# Patient Record
Sex: Female | Born: 1935 | Race: White | Hispanic: No | Smoking: Never smoker
Health system: Southern US, Community
[De-identification: ages and names within clinical notes are randomized; demographics above are authoritative.]

## PROBLEM LIST (undated history)

## (undated) DIAGNOSIS — T7840XA Allergy, unspecified, initial encounter: Secondary | ICD-10-CM

## (undated) DIAGNOSIS — M1990 Unspecified osteoarthritis, unspecified site: Secondary | ICD-10-CM

## (undated) DIAGNOSIS — Z972 Presence of dental prosthetic device (complete) (partial): Secondary | ICD-10-CM

## (undated) DIAGNOSIS — I10 Essential (primary) hypertension: Secondary | ICD-10-CM

## (undated) DIAGNOSIS — C569 Malignant neoplasm of unspecified ovary: Secondary | ICD-10-CM

## (undated) DIAGNOSIS — E785 Hyperlipidemia, unspecified: Secondary | ICD-10-CM

## (undated) DIAGNOSIS — I2510 Atherosclerotic heart disease of native coronary artery without angina pectoris: Secondary | ICD-10-CM

## (undated) DIAGNOSIS — C801 Malignant (primary) neoplasm, unspecified: Secondary | ICD-10-CM

## (undated) DIAGNOSIS — C4499 Other specified malignant neoplasm of skin, unspecified: Secondary | ICD-10-CM

## (undated) DIAGNOSIS — Z9221 Personal history of antineoplastic chemotherapy: Secondary | ICD-10-CM

## (undated) DIAGNOSIS — C50919 Malignant neoplasm of unspecified site of unspecified female breast: Secondary | ICD-10-CM

## (undated) DIAGNOSIS — S3210XA Unspecified fracture of sacrum, initial encounter for closed fracture: Secondary | ICD-10-CM

## (undated) DIAGNOSIS — I429 Cardiomyopathy, unspecified: Secondary | ICD-10-CM

## (undated) DIAGNOSIS — Z923 Personal history of irradiation: Secondary | ICD-10-CM

## (undated) DIAGNOSIS — K5790 Diverticulosis of intestine, part unspecified, without perforation or abscess without bleeding: Secondary | ICD-10-CM

## (undated) DIAGNOSIS — K909 Intestinal malabsorption, unspecified: Secondary | ICD-10-CM

## (undated) DIAGNOSIS — Z973 Presence of spectacles and contact lenses: Secondary | ICD-10-CM

## (undated) HISTORY — PX: SHOULDER ARTHROSCOPY: SHX128

## (undated) HISTORY — DX: Malignant neoplasm of unspecified ovary: C56.9

## (undated) HISTORY — DX: Diverticulosis of intestine, part unspecified, without perforation or abscess without bleeding: K57.90

## (undated) HISTORY — DX: Atherosclerotic heart disease of native coronary artery without angina pectoris: I25.10

## (undated) HISTORY — DX: Intestinal malabsorption, unspecified: K90.9

## (undated) HISTORY — DX: Unspecified osteoarthritis, unspecified site: M19.90

## (undated) HISTORY — PX: BREAST SURGERY: SHX581

## (undated) HISTORY — DX: Hyperlipidemia, unspecified: E78.5

## (undated) HISTORY — PX: EYE SURGERY: SHX253

## (undated) HISTORY — DX: Essential (primary) hypertension: I10

## (undated) HISTORY — DX: Cardiomyopathy, unspecified: I42.9

## (undated) HISTORY — DX: Allergy, unspecified, initial encounter: T78.40XA

## (undated) HISTORY — DX: Other specified malignant neoplasm of skin, unspecified: C44.99

## (undated) HISTORY — DX: Malignant neoplasm of unspecified site of unspecified female breast: C50.919

## (undated) HISTORY — DX: Malignant (primary) neoplasm, unspecified: C80.1

---

## 1988-11-16 HISTORY — PX: SHOULDER SURGERY: SHX246

## 1988-11-16 HISTORY — PX: OTHER SURGICAL HISTORY: SHX169

## 1991-11-17 HISTORY — PX: CHOLECYSTECTOMY: SHX55

## 1992-11-16 DIAGNOSIS — Z923 Personal history of irradiation: Secondary | ICD-10-CM

## 1992-11-16 HISTORY — PX: MODIFIED RADICAL MASTECTOMY W/ AXILLARY LYMPH NODE DISSECTION W/ PECTORALIS MINOR EXCISION: SHX2043

## 1992-11-16 HISTORY — PX: OTHER SURGICAL HISTORY: SHX169

## 1992-11-16 HISTORY — DX: Personal history of irradiation: Z92.3

## 1999-03-27 ENCOUNTER — Ambulatory Visit (HOSPITAL_COMMUNITY): Admission: RE | Admit: 1999-03-27 | Discharge: 1999-03-27 | Payer: Self-pay | Admitting: Gastroenterology

## 1999-05-01 ENCOUNTER — Other Ambulatory Visit: Admission: RE | Admit: 1999-05-01 | Discharge: 1999-05-01 | Payer: Self-pay | Admitting: Urology

## 1999-11-26 ENCOUNTER — Other Ambulatory Visit: Admission: RE | Admit: 1999-11-26 | Discharge: 1999-11-26 | Payer: Self-pay | Admitting: Urology

## 2000-08-13 ENCOUNTER — Other Ambulatory Visit: Admission: RE | Admit: 2000-08-13 | Discharge: 2000-08-13 | Payer: Self-pay | Admitting: Internal Medicine

## 2001-11-14 ENCOUNTER — Other Ambulatory Visit: Admission: RE | Admit: 2001-11-14 | Discharge: 2001-11-14 | Payer: Self-pay | Admitting: Internal Medicine

## 2002-04-06 ENCOUNTER — Emergency Department (HOSPITAL_COMMUNITY): Admission: EM | Admit: 2002-04-06 | Discharge: 2002-04-06 | Payer: Self-pay | Admitting: Emergency Medicine

## 2002-11-16 HISTORY — PX: ABDOMINAL HYSTERECTOMY: SHX81

## 2003-06-15 ENCOUNTER — Ambulatory Visit (HOSPITAL_COMMUNITY): Admission: RE | Admit: 2003-06-15 | Discharge: 2003-06-15 | Payer: Self-pay | Admitting: Orthopedic Surgery

## 2003-06-15 ENCOUNTER — Encounter: Payer: Self-pay | Admitting: Orthopedic Surgery

## 2003-06-23 ENCOUNTER — Ambulatory Visit (HOSPITAL_COMMUNITY): Admission: RE | Admit: 2003-06-23 | Discharge: 2003-06-23 | Payer: Self-pay | Admitting: Orthopedic Surgery

## 2003-06-23 ENCOUNTER — Encounter: Payer: Self-pay | Admitting: Orthopedic Surgery

## 2003-06-29 ENCOUNTER — Other Ambulatory Visit: Admission: RE | Admit: 2003-06-29 | Discharge: 2003-06-29 | Payer: Self-pay | Admitting: Obstetrics and Gynecology

## 2003-07-11 ENCOUNTER — Ambulatory Visit: Admission: RE | Admit: 2003-07-11 | Discharge: 2003-07-11 | Payer: Self-pay | Admitting: Gynecologic Oncology

## 2003-07-13 ENCOUNTER — Encounter: Payer: Self-pay | Admitting: Gynecology

## 2003-07-17 ENCOUNTER — Encounter (INDEPENDENT_AMBULATORY_CARE_PROVIDER_SITE_OTHER): Payer: Self-pay

## 2003-07-17 ENCOUNTER — Inpatient Hospital Stay (HOSPITAL_COMMUNITY): Admission: RE | Admit: 2003-07-17 | Discharge: 2003-07-20 | Payer: Self-pay | Admitting: Gynecology

## 2003-07-24 ENCOUNTER — Ambulatory Visit: Admission: RE | Admit: 2003-07-24 | Discharge: 2003-07-24 | Payer: Self-pay | Admitting: Gynecology

## 2003-09-10 ENCOUNTER — Encounter: Payer: Self-pay | Admitting: Oncology

## 2003-09-10 ENCOUNTER — Ambulatory Visit (HOSPITAL_COMMUNITY): Admission: RE | Admit: 2003-09-10 | Discharge: 2003-09-10 | Payer: Self-pay | Admitting: Oncology

## 2003-11-21 ENCOUNTER — Ambulatory Visit: Admission: RE | Admit: 2003-11-21 | Discharge: 2003-11-21 | Payer: Self-pay | Admitting: Gynecology

## 2004-04-02 ENCOUNTER — Ambulatory Visit: Admission: RE | Admit: 2004-04-02 | Discharge: 2004-04-02 | Payer: Self-pay | Admitting: Gynecology

## 2004-05-22 ENCOUNTER — Ambulatory Visit (HOSPITAL_COMMUNITY): Admission: RE | Admit: 2004-05-22 | Discharge: 2004-05-22 | Payer: Self-pay | Admitting: Gastroenterology

## 2004-09-16 ENCOUNTER — Ambulatory Visit: Payer: Self-pay | Admitting: Internal Medicine

## 2004-09-17 ENCOUNTER — Ambulatory Visit: Admission: RE | Admit: 2004-09-17 | Discharge: 2004-09-17 | Payer: Self-pay | Admitting: Gynecology

## 2004-09-29 ENCOUNTER — Ambulatory Visit: Payer: Self-pay | Admitting: Oncology

## 2004-09-30 ENCOUNTER — Ambulatory Visit: Payer: Self-pay | Admitting: Internal Medicine

## 2004-11-26 ENCOUNTER — Ambulatory Visit: Payer: Self-pay | Admitting: Oncology

## 2005-02-13 ENCOUNTER — Ambulatory Visit: Payer: Self-pay | Admitting: Oncology

## 2005-02-20 ENCOUNTER — Ambulatory Visit: Admission: RE | Admit: 2005-02-20 | Discharge: 2005-02-20 | Payer: Self-pay | Admitting: Gynecology

## 2005-04-21 ENCOUNTER — Ambulatory Visit: Payer: Self-pay | Admitting: Oncology

## 2005-07-02 ENCOUNTER — Ambulatory Visit: Payer: Self-pay | Admitting: Oncology

## 2005-07-23 ENCOUNTER — Ambulatory Visit: Payer: Self-pay | Admitting: Internal Medicine

## 2005-07-31 ENCOUNTER — Ambulatory Visit: Payer: Self-pay

## 2005-08-12 ENCOUNTER — Ambulatory Visit: Payer: Self-pay | Admitting: Cardiology

## 2005-08-17 ENCOUNTER — Ambulatory Visit: Payer: Self-pay

## 2005-08-18 ENCOUNTER — Ambulatory Visit: Payer: Self-pay | Admitting: Cardiology

## 2005-08-18 ENCOUNTER — Inpatient Hospital Stay (HOSPITAL_BASED_OUTPATIENT_CLINIC_OR_DEPARTMENT_OTHER): Admission: RE | Admit: 2005-08-18 | Discharge: 2005-08-18 | Payer: Self-pay | Admitting: Cardiology

## 2005-08-21 ENCOUNTER — Ambulatory Visit: Payer: Self-pay

## 2005-08-28 ENCOUNTER — Ambulatory Visit: Admission: RE | Admit: 2005-08-28 | Discharge: 2005-08-28 | Payer: Self-pay | Admitting: Gynecology

## 2005-09-04 ENCOUNTER — Ambulatory Visit: Payer: Self-pay | Admitting: Cardiology

## 2005-09-24 ENCOUNTER — Ambulatory Visit: Payer: Self-pay | Admitting: Oncology

## 2005-09-26 ENCOUNTER — Ambulatory Visit: Payer: Self-pay | Admitting: Internal Medicine

## 2005-10-23 ENCOUNTER — Ambulatory Visit: Payer: Self-pay | Admitting: Cardiology

## 2005-11-20 ENCOUNTER — Ambulatory Visit: Payer: Self-pay | Admitting: Oncology

## 2005-11-27 ENCOUNTER — Ambulatory Visit: Payer: Self-pay | Admitting: Family Medicine

## 2006-01-07 ENCOUNTER — Ambulatory Visit: Payer: Self-pay | Admitting: Internal Medicine

## 2006-01-14 ENCOUNTER — Ambulatory Visit: Payer: Self-pay | Admitting: Internal Medicine

## 2006-01-14 ENCOUNTER — Inpatient Hospital Stay (HOSPITAL_COMMUNITY): Admission: EM | Admit: 2006-01-14 | Discharge: 2006-01-18 | Payer: Self-pay | Admitting: Emergency Medicine

## 2006-01-22 ENCOUNTER — Ambulatory Visit: Payer: Self-pay | Admitting: Internal Medicine

## 2006-02-04 ENCOUNTER — Ambulatory Visit: Payer: Self-pay | Admitting: Internal Medicine

## 2006-02-11 ENCOUNTER — Ambulatory Visit: Payer: Self-pay

## 2006-02-11 ENCOUNTER — Ambulatory Visit: Payer: Self-pay | Admitting: Internal Medicine

## 2006-02-12 ENCOUNTER — Ambulatory Visit: Payer: Self-pay | Admitting: Cardiology

## 2006-02-12 ENCOUNTER — Ambulatory Visit: Admission: RE | Admit: 2006-02-12 | Discharge: 2006-02-12 | Payer: Self-pay | Admitting: Cardiology

## 2006-02-12 ENCOUNTER — Ambulatory Visit: Payer: Self-pay | Admitting: Oncology

## 2006-02-23 ENCOUNTER — Ambulatory Visit: Payer: Self-pay | Admitting: Internal Medicine

## 2006-03-03 ENCOUNTER — Ambulatory Visit: Payer: Self-pay | Admitting: Cardiology

## 2006-03-08 ENCOUNTER — Encounter: Admission: RE | Admit: 2006-03-08 | Discharge: 2006-03-24 | Payer: Self-pay | Admitting: Internal Medicine

## 2006-03-29 ENCOUNTER — Ambulatory Visit: Payer: Self-pay | Admitting: Oncology

## 2006-03-29 LAB — CBC WITH DIFFERENTIAL/PLATELET
BASO%: 0.8 % (ref 0.0–2.0)
EOS%: 2.7 % (ref 0.0–7.0)
HCT: 33.4 % — ABNORMAL LOW (ref 34.8–46.6)
LYMPH%: 22.6 % (ref 14.0–48.0)
MCH: 31.4 pg (ref 26.0–34.0)
MCHC: 34.8 g/dL (ref 32.0–36.0)
NEUT%: 64.6 % (ref 39.6–76.8)
Platelets: 205 10*3/uL (ref 145–400)
lymph#: 0.8 10*3/uL — ABNORMAL LOW (ref 0.9–3.3)

## 2006-03-31 ENCOUNTER — Ambulatory Visit: Payer: Self-pay

## 2006-03-31 ENCOUNTER — Ambulatory Visit: Payer: Self-pay | Admitting: Cardiology

## 2006-05-10 ENCOUNTER — Ambulatory Visit: Payer: Self-pay | Admitting: Oncology

## 2006-05-10 LAB — CBC WITH DIFFERENTIAL/PLATELET
BASO%: 0.6 % (ref 0.0–2.0)
HCT: 32.7 % — ABNORMAL LOW (ref 34.8–46.6)
LYMPH%: 26 % (ref 14.0–48.0)
MCHC: 34.6 g/dL (ref 32.0–36.0)
MCV: 90.8 fL (ref 81.0–101.0)
MONO#: 0.3 10*3/uL (ref 0.1–0.9)
NEUT%: 60.6 % (ref 39.6–76.8)
Platelets: 172 10*3/uL (ref 145–400)
WBC: 3.1 10*3/uL — ABNORMAL LOW (ref 3.9–10.0)

## 2006-05-27 LAB — CBC WITH DIFFERENTIAL/PLATELET
Basophils Absolute: 0 10*3/uL (ref 0.0–0.1)
EOS%: 1.8 % (ref 0.0–7.0)
Eosinophils Absolute: 0.1 10*3/uL (ref 0.0–0.5)
HGB: 12 g/dL (ref 11.6–15.9)
MCH: 31.7 pg (ref 26.0–34.0)
MONO#: 0.3 10*3/uL (ref 0.1–0.9)
NEUT#: 2.8 10*3/uL (ref 1.5–6.5)
RDW: 13.8 % (ref 11.3–14.5)
WBC: 4.2 10*3/uL (ref 3.9–10.0)
lymph#: 0.9 10*3/uL (ref 0.9–3.3)

## 2006-05-27 LAB — COMPREHENSIVE METABOLIC PANEL
ALT: 11 U/L (ref 0–40)
AST: 14 U/L (ref 0–37)
Alkaline Phosphatase: 47 U/L (ref 39–117)
CO2: 29 mEq/L (ref 19–32)
Creatinine, Ser: 1.17 mg/dL (ref 0.40–1.20)
Total Bilirubin: 1.5 mg/dL — ABNORMAL HIGH (ref 0.3–1.2)

## 2006-06-01 ENCOUNTER — Ambulatory Visit: Admission: RE | Admit: 2006-06-01 | Discharge: 2006-06-01 | Payer: Self-pay | Admitting: Gynecology

## 2006-06-21 LAB — CBC WITH DIFFERENTIAL/PLATELET
BASO%: 0.6 % (ref 0.0–2.0)
EOS%: 2.8 % (ref 0.0–7.0)
HCT: 34.9 % (ref 34.8–46.6)
LYMPH%: 18.1 % (ref 14.0–48.0)
MCH: 31.6 pg (ref 26.0–34.0)
MCHC: 34.7 g/dL (ref 32.0–36.0)
NEUT%: 71.3 % (ref 39.6–76.8)
Platelets: 193 10*3/uL (ref 145–400)
RBC: 3.83 10*6/uL (ref 3.70–5.32)

## 2006-07-06 ENCOUNTER — Ambulatory Visit: Payer: Self-pay | Admitting: Internal Medicine

## 2006-07-29 ENCOUNTER — Ambulatory Visit: Payer: Self-pay | Admitting: Oncology

## 2006-08-02 LAB — CBC WITH DIFFERENTIAL/PLATELET
BASO%: 0.5 % (ref 0.0–2.0)
EOS%: 2.9 % (ref 0.0–7.0)
MCH: 31.4 pg (ref 26.0–34.0)
MCHC: 34.3 g/dL (ref 32.0–36.0)
RBC: 3.85 10*6/uL (ref 3.70–5.32)
RDW: 13.9 % (ref 11.3–14.5)
WBC: 4.2 10*3/uL (ref 3.9–10.0)
lymph#: 0.9 10*3/uL (ref 0.9–3.3)

## 2006-09-09 ENCOUNTER — Ambulatory Visit: Payer: Self-pay | Admitting: Internal Medicine

## 2006-09-13 ENCOUNTER — Ambulatory Visit: Payer: Self-pay | Admitting: Oncology

## 2006-10-25 LAB — CBC WITH DIFFERENTIAL/PLATELET
BASO%: 0.7 % (ref 0.0–2.0)
HCT: 33.7 % — ABNORMAL LOW (ref 34.8–46.6)
LYMPH%: 18.3 % (ref 14.0–48.0)
MCHC: 35 g/dL (ref 32.0–36.0)
MCV: 92.8 fL (ref 81.0–101.0)
MONO#: 0.3 10*3/uL (ref 0.1–0.9)
MONO%: 6.7 % (ref 0.0–13.0)
NEUT%: 71.6 % (ref 39.6–76.8)
Platelets: 187 10*3/uL (ref 145–400)
RBC: 3.63 10*6/uL — ABNORMAL LOW (ref 3.70–5.32)
WBC: 4.7 10*3/uL (ref 3.9–10.0)

## 2006-11-17 ENCOUNTER — Ambulatory Visit: Payer: Self-pay | Admitting: Oncology

## 2006-11-22 LAB — CBC WITH DIFFERENTIAL/PLATELET
BASO%: 0.9 % (ref 0.0–2.0)
EOS%: 3.3 % (ref 0.0–7.0)
MCH: 31.5 pg (ref 26.0–34.0)
MCHC: 33.7 g/dL (ref 32.0–36.0)
RBC: 3.88 10*6/uL (ref 3.70–5.32)
RDW: 13.1 % (ref 11.3–14.5)
lymph#: 1 10*3/uL (ref 0.9–3.3)

## 2006-11-22 LAB — COMPREHENSIVE METABOLIC PANEL
ALT: 16 U/L (ref 0–35)
AST: 17 U/L (ref 0–37)
Albumin: 4.1 g/dL (ref 3.5–5.2)
Calcium: 9.2 mg/dL (ref 8.4–10.5)
Chloride: 101 mEq/L (ref 96–112)
Creatinine, Ser: 1.12 mg/dL (ref 0.40–1.20)
Potassium: 4.1 mEq/L (ref 3.5–5.3)

## 2006-11-24 ENCOUNTER — Ambulatory Visit: Payer: Self-pay | Admitting: Cardiology

## 2006-12-14 ENCOUNTER — Encounter: Payer: Self-pay | Admitting: Cardiology

## 2006-12-14 ENCOUNTER — Ambulatory Visit: Payer: Self-pay | Admitting: Cardiology

## 2006-12-14 ENCOUNTER — Ambulatory Visit: Payer: Self-pay

## 2006-12-14 LAB — CONVERTED CEMR LAB
ALT: 20 units/L (ref 0–40)
AST: 23 units/L (ref 0–37)
Albumin: 3.8 g/dL (ref 3.5–5.2)
Bilirubin, Direct: 0.3 mg/dL (ref 0.0–0.3)
Calcium: 9.4 mg/dL (ref 8.4–10.5)
Chloride: 98 meq/L (ref 96–112)
Creatinine, Ser: 1.3 mg/dL — ABNORMAL HIGH (ref 0.4–1.2)
GFR calc non Af Amer: 43 mL/min
HDL: 56.2 mg/dL (ref >39.0)
Sodium: 136 meq/L (ref 135–145)
Total Protein: 6.5 g/dL (ref 6.0–8.3)

## 2007-01-05 ENCOUNTER — Ambulatory Visit: Payer: Self-pay | Admitting: Internal Medicine

## 2007-01-13 ENCOUNTER — Ambulatory Visit: Payer: Self-pay | Admitting: Oncology

## 2007-01-17 LAB — CBC WITH DIFFERENTIAL/PLATELET
BASO%: 0.6 % (ref 0.0–2.0)
Basophils Absolute: 0 10*3/uL (ref 0.0–0.1)
EOS%: 2.1 % (ref 0.0–7.0)
HGB: 11.5 g/dL — ABNORMAL LOW (ref 11.6–15.9)
MCH: 32.6 pg (ref 26.0–34.0)
MCHC: 35.9 g/dL (ref 32.0–36.0)
MCV: 90.9 fL (ref 81.0–101.0)
MONO%: 8.9 % (ref 0.0–13.0)
RBC: 3.53 10*6/uL — ABNORMAL LOW (ref 3.70–5.32)
RDW: 13.4 % (ref 11.3–14.5)
lymph#: 0.7 10*3/uL — ABNORMAL LOW (ref 0.9–3.3)

## 2007-02-09 ENCOUNTER — Ambulatory Visit: Payer: Self-pay | Admitting: Cardiology

## 2007-03-09 ENCOUNTER — Ambulatory Visit: Payer: Self-pay | Admitting: Oncology

## 2007-03-14 LAB — CBC WITH DIFFERENTIAL/PLATELET
Basophils Absolute: 0 10*3/uL (ref 0.0–0.1)
Eosinophils Absolute: 0.1 10*3/uL (ref 0.0–0.5)
HGB: 11 g/dL — ABNORMAL LOW (ref 11.6–15.9)
MCV: 92.4 fL (ref 81.0–101.0)
MONO%: 8 % (ref 0.0–13.0)
NEUT#: 3.1 10*3/uL (ref 1.5–6.5)
RBC: 3.29 10*6/uL — ABNORMAL LOW (ref 3.70–5.32)
RDW: 13.1 % (ref 11.3–14.5)
WBC: 4.3 10*3/uL (ref 3.9–10.0)
lymph#: 0.8 10*3/uL — ABNORMAL LOW (ref 0.9–3.3)

## 2007-05-04 ENCOUNTER — Ambulatory Visit: Payer: Self-pay | Admitting: Oncology

## 2007-05-09 LAB — CBC WITH DIFFERENTIAL/PLATELET
Basophils Absolute: 0 10*3/uL (ref 0.0–0.1)
Eosinophils Absolute: 0.1 10*3/uL (ref 0.0–0.5)
HGB: 10.9 g/dL — ABNORMAL LOW (ref 11.6–15.9)
LYMPH%: 18.7 % (ref 14.0–48.0)
MONO#: 0.3 10*3/uL (ref 0.1–0.9)
NEUT#: 2.9 10*3/uL (ref 1.5–6.5)
Platelets: 206 10*3/uL (ref 145–400)
RBC: 3.31 10*6/uL — ABNORMAL LOW (ref 3.70–5.32)
RDW: 12.9 % (ref 11.3–14.5)
WBC: 4.1 10*3/uL (ref 3.9–10.0)

## 2007-05-17 LAB — CA 125: CA 125: 10.1 U/mL (ref 0.0–30.2)

## 2007-05-18 ENCOUNTER — Ambulatory Visit (HOSPITAL_COMMUNITY): Admission: RE | Admit: 2007-05-18 | Discharge: 2007-05-18 | Payer: Self-pay | Admitting: Oncology

## 2007-05-24 ENCOUNTER — Ambulatory Visit: Admission: RE | Admit: 2007-05-24 | Discharge: 2007-05-24 | Payer: Self-pay | Admitting: Gynecology

## 2007-06-30 ENCOUNTER — Ambulatory Visit: Payer: Self-pay | Admitting: Oncology

## 2007-07-04 LAB — CBC WITH DIFFERENTIAL/PLATELET
Basophils Absolute: 0 10*3/uL (ref 0.0–0.1)
HCT: 30.8 % — ABNORMAL LOW (ref 34.8–46.6)
HGB: 11.2 g/dL — ABNORMAL LOW (ref 11.6–15.9)
MONO#: 0.3 10*3/uL (ref 0.1–0.9)
NEUT%: 72.1 % (ref 39.6–76.8)
Platelets: 199 10*3/uL (ref 145–400)
WBC: 4.2 10*3/uL (ref 3.9–10.0)
lymph#: 0.8 10*3/uL — ABNORMAL LOW (ref 0.9–3.3)

## 2007-07-13 DIAGNOSIS — I10 Essential (primary) hypertension: Secondary | ICD-10-CM | POA: Insufficient documentation

## 2007-07-13 DIAGNOSIS — Z860100 Personal history of colon polyps, unspecified: Secondary | ICD-10-CM | POA: Insufficient documentation

## 2007-07-13 DIAGNOSIS — I2510 Atherosclerotic heart disease of native coronary artery without angina pectoris: Secondary | ICD-10-CM | POA: Insufficient documentation

## 2007-07-13 DIAGNOSIS — Z853 Personal history of malignant neoplasm of breast: Secondary | ICD-10-CM | POA: Insufficient documentation

## 2007-07-20 ENCOUNTER — Ambulatory Visit: Payer: Self-pay | Admitting: Cardiology

## 2007-08-25 ENCOUNTER — Ambulatory Visit: Payer: Self-pay | Admitting: Oncology

## 2007-08-29 LAB — CBC WITH DIFFERENTIAL/PLATELET
Basophils Absolute: 0 10*3/uL (ref 0.0–0.1)
EOS%: 2.4 % (ref 0.0–7.0)
Eosinophils Absolute: 0.1 10*3/uL (ref 0.0–0.5)
HCT: 30.5 % — ABNORMAL LOW (ref 34.8–46.6)
HGB: 11 g/dL — ABNORMAL LOW (ref 11.6–15.9)
LYMPH%: 22.7 % (ref 14.0–48.0)
MCH: 33.2 pg (ref 26.0–34.0)
MCV: 91.8 fL (ref 81.0–101.0)
MONO%: 7.4 % (ref 0.0–13.0)
NEUT%: 66.7 % (ref 39.6–76.8)
Platelets: 198 10*3/uL (ref 145–400)
RDW: 13.1 % (ref 11.3–14.5)

## 2007-09-08 ENCOUNTER — Ambulatory Visit: Payer: Self-pay | Admitting: Internal Medicine

## 2007-09-08 DIAGNOSIS — J069 Acute upper respiratory infection, unspecified: Secondary | ICD-10-CM | POA: Insufficient documentation

## 2007-09-29 ENCOUNTER — Ambulatory Visit: Payer: Self-pay | Admitting: Internal Medicine

## 2007-10-17 ENCOUNTER — Telehealth: Payer: Self-pay | Admitting: Internal Medicine

## 2007-10-20 ENCOUNTER — Ambulatory Visit: Payer: Self-pay | Admitting: Oncology

## 2007-10-24 ENCOUNTER — Telehealth: Payer: Self-pay | Admitting: Internal Medicine

## 2007-10-24 LAB — CBC WITH DIFFERENTIAL/PLATELET
EOS%: 2.6 % (ref 0.0–7.0)
LYMPH%: 18.6 % (ref 14.0–48.0)
MCH: 32.3 pg (ref 26.0–34.0)
MCHC: 35.5 g/dL (ref 32.0–36.0)
MCV: 91.2 fL (ref 81.0–101.0)
MONO%: 6.5 % (ref 0.0–13.0)
Platelets: 214 10*3/uL (ref 145–400)
RBC: 3.48 10*6/uL — ABNORMAL LOW (ref 3.70–5.32)
RDW: 14.2 % (ref 11.3–14.5)

## 2007-11-22 ENCOUNTER — Ambulatory Visit: Payer: Self-pay | Admitting: Oncology

## 2007-11-22 ENCOUNTER — Encounter: Payer: Self-pay | Admitting: Internal Medicine

## 2007-11-22 LAB — COMPREHENSIVE METABOLIC PANEL
ALT: 13 U/L (ref 0–35)
Albumin: 4.1 g/dL (ref 3.5–5.2)
Alkaline Phosphatase: 46 U/L (ref 39–117)
Glucose, Bld: 73 mg/dL (ref 70–99)
Potassium: 3.8 mEq/L (ref 3.5–5.3)
Sodium: 134 mEq/L — ABNORMAL LOW (ref 135–145)
Total Bilirubin: 1.1 mg/dL (ref 0.3–1.2)
Total Protein: 6.4 g/dL (ref 6.0–8.3)

## 2007-11-22 LAB — CBC WITH DIFFERENTIAL/PLATELET
BASO%: 1 % (ref 0.0–2.0)
Eosinophils Absolute: 0.1 10*3/uL (ref 0.0–0.5)
LYMPH%: 17.4 % (ref 14.0–48.0)
MCHC: 35.5 g/dL (ref 32.0–36.0)
MONO#: 0.4 10*3/uL (ref 0.1–0.9)
MONO%: 7.7 % (ref 0.0–13.0)
NEUT#: 3.4 10*3/uL (ref 1.5–6.5)
Platelets: 226 10*3/uL (ref 145–400)
RBC: 3.58 10*6/uL — ABNORMAL LOW (ref 3.70–5.32)
RDW: 13.7 % (ref 11.3–14.5)
WBC: 4.8 10*3/uL (ref 3.9–10.0)

## 2007-12-08 ENCOUNTER — Ambulatory Visit: Payer: Self-pay | Admitting: Cardiology

## 2007-12-08 LAB — CONVERTED CEMR LAB
CO2: 31 meq/L (ref 19–32)
Calcium: 9.1 mg/dL (ref 8.4–10.5)
Creatinine, Ser: 1.2 mg/dL (ref 0.4–1.2)
GFR calc non Af Amer: 47 mL/min
Potassium: 4 meq/L (ref 3.5–5.1)
Sodium: 131 meq/L — ABNORMAL LOW (ref 135–145)

## 2007-12-20 LAB — CBC WITH DIFFERENTIAL/PLATELET
Basophils Absolute: 0.1 10*3/uL (ref 0.0–0.1)
Eosinophils Absolute: 0.2 10*3/uL (ref 0.0–0.5)
HCT: 32.3 % — ABNORMAL LOW (ref 34.8–46.6)
HGB: 11.7 g/dL (ref 11.6–15.9)
LYMPH%: 17.5 % (ref 14.0–48.0)
MCHC: 36.2 g/dL — ABNORMAL HIGH (ref 32.0–36.0)
MONO#: 0.4 10*3/uL (ref 0.1–0.9)
NEUT#: 3.3 10*3/uL (ref 1.5–6.5)
NEUT%: 68.7 % (ref 39.6–76.8)
Platelets: 187 10*3/uL (ref 145–400)
WBC: 4.8 10*3/uL (ref 3.9–10.0)
lymph#: 0.8 10*3/uL — ABNORMAL LOW (ref 0.9–3.3)

## 2008-01-27 ENCOUNTER — Ambulatory Visit: Payer: Self-pay | Admitting: Oncology

## 2008-01-31 LAB — CBC WITH DIFFERENTIAL/PLATELET
BASO%: 1.1 % (ref 0.0–2.0)
Eosinophils Absolute: 0.2 10*3/uL (ref 0.0–0.5)
LYMPH%: 19.9 % (ref 14.0–48.0)
MONO#: 0.6 10*3/uL (ref 0.1–0.9)
NEUT#: 2.5 10*3/uL (ref 1.5–6.5)
Platelets: 161 10*3/uL (ref 145–400)
RBC: 3.49 10*6/uL — ABNORMAL LOW (ref 3.70–5.32)
WBC: 4.2 10*3/uL (ref 3.9–10.0)
lymph#: 0.8 10*3/uL — ABNORMAL LOW (ref 0.9–3.3)

## 2008-02-03 ENCOUNTER — Ambulatory Visit: Payer: Self-pay | Admitting: Family Medicine

## 2008-02-04 DIAGNOSIS — J449 Chronic obstructive pulmonary disease, unspecified: Secondary | ICD-10-CM | POA: Insufficient documentation

## 2008-02-07 ENCOUNTER — Ambulatory Visit: Payer: Self-pay | Admitting: Internal Medicine

## 2008-02-16 ENCOUNTER — Ambulatory Visit: Payer: Self-pay | Admitting: Internal Medicine

## 2008-02-16 DIAGNOSIS — M810 Age-related osteoporosis without current pathological fracture: Secondary | ICD-10-CM | POA: Insufficient documentation

## 2008-02-16 DIAGNOSIS — N390 Urinary tract infection, site not specified: Secondary | ICD-10-CM | POA: Insufficient documentation

## 2008-03-09 ENCOUNTER — Ambulatory Visit: Payer: Self-pay | Admitting: Oncology

## 2008-03-13 LAB — CBC WITH DIFFERENTIAL/PLATELET
BASO%: 1.4 % (ref 0.0–2.0)
Eosinophils Absolute: 0.2 10*3/uL (ref 0.0–0.5)
MCHC: 35.6 g/dL (ref 32.0–36.0)
MONO#: 0.4 10*3/uL (ref 0.1–0.9)
MONO%: 9.1 % (ref 0.0–13.0)
NEUT#: 3.1 10*3/uL (ref 1.5–6.5)
RBC: 3.65 10*6/uL — ABNORMAL LOW (ref 3.70–5.32)
RDW: 11.9 % (ref 11.3–14.5)
WBC: 4.6 10*3/uL (ref 3.9–10.0)

## 2008-04-24 ENCOUNTER — Ambulatory Visit: Payer: Self-pay | Admitting: Oncology

## 2008-04-24 LAB — CBC WITH DIFFERENTIAL/PLATELET
EOS%: 2.6 % (ref 0.0–7.0)
MCH: 32.2 pg (ref 26.0–34.0)
MCHC: 36.2 g/dL — ABNORMAL HIGH (ref 32.0–36.0)
MCV: 89 fL (ref 81.0–101.0)
MONO%: 9.9 % (ref 0.0–13.0)
RBC: 3.61 10*6/uL — ABNORMAL LOW (ref 3.70–5.32)
RDW: 11.5 % (ref 11.3–14.5)

## 2008-05-31 ENCOUNTER — Ambulatory Visit: Payer: Self-pay | Admitting: Oncology

## 2008-06-05 LAB — CBC WITH DIFFERENTIAL/PLATELET
BASO%: 0.9 % (ref 0.0–2.0)
EOS%: 3.1 % (ref 0.0–7.0)
HCT: 32 % — ABNORMAL LOW (ref 34.8–46.6)
LYMPH%: 21.5 % (ref 14.0–48.0)
MCH: 32.7 pg (ref 26.0–34.0)
MCHC: 36.1 g/dL — ABNORMAL HIGH (ref 32.0–36.0)
MONO%: 9.9 % (ref 0.0–13.0)
NEUT%: 64.6 % (ref 39.6–76.8)
Platelets: 209 10*3/uL (ref 145–400)
lymph#: 0.7 10*3/uL — ABNORMAL LOW (ref 0.9–3.3)

## 2008-06-20 ENCOUNTER — Ambulatory Visit: Admission: RE | Admit: 2008-06-20 | Discharge: 2008-06-20 | Payer: Self-pay | Admitting: Gynecology

## 2008-07-17 ENCOUNTER — Ambulatory Visit: Payer: Self-pay | Admitting: Oncology

## 2008-07-17 LAB — CBC WITH DIFFERENTIAL/PLATELET
Basophils Absolute: 0.1 10*3/uL (ref 0.0–0.1)
Eosinophils Absolute: 0.1 10*3/uL (ref 0.0–0.5)
HGB: 11.6 g/dL (ref 11.6–15.9)
MCV: 88.7 fL (ref 81.0–101.0)
NEUT#: 4.3 10*3/uL (ref 1.5–6.5)
RDW: 11.2 % — ABNORMAL LOW (ref 11.3–14.5)
lymph#: 0.9 10*3/uL (ref 0.9–3.3)

## 2008-08-23 ENCOUNTER — Ambulatory Visit: Payer: Self-pay | Admitting: Internal Medicine

## 2008-08-28 ENCOUNTER — Ambulatory Visit: Payer: Self-pay | Admitting: Internal Medicine

## 2008-08-28 LAB — CBC WITH DIFFERENTIAL/PLATELET
BASO%: 1.2 % (ref 0.0–2.0)
Basophils Absolute: 0.1 10e3/uL (ref 0.0–0.1)
EOS%: 4.1 % (ref 0.0–7.0)
Eosinophils Absolute: 0.2 10e3/uL (ref 0.0–0.5)
HCT: 32.5 % — ABNORMAL LOW (ref 34.8–46.6)
HGB: 11.7 g/dL (ref 11.6–15.9)
LYMPH%: 16.6 % (ref 14.0–48.0)
MCH: 32.4 pg (ref 26.0–34.0)
MCHC: 36 g/dL (ref 32.0–36.0)
MCV: 90 fL (ref 81.0–101.0)
MONO#: 0.5 10e3/uL (ref 0.1–0.9)
MONO%: 9.2 % (ref 0.0–13.0)
NEUT#: 3.6 10e3/uL (ref 1.5–6.5)
NEUT%: 68.9 % (ref 39.6–76.8)
Platelets: 188 10e3/uL (ref 145–400)
RBC: 3.61 10e6/uL — ABNORMAL LOW (ref 3.70–5.32)
RDW: 11.9 % (ref 11.3–14.5)
WBC: 5.2 10e3/uL (ref 3.9–10.0)
lymph#: 0.9 10e3/uL (ref 0.9–3.3)

## 2008-10-05 ENCOUNTER — Ambulatory Visit: Payer: Self-pay | Admitting: Oncology

## 2008-10-09 LAB — CBC WITH DIFFERENTIAL/PLATELET
BASO%: 0.4 % (ref 0.0–2.0)
EOS%: 2.7 % (ref 0.0–7.0)
MCH: 32.9 pg (ref 26.0–34.0)
MCHC: 35.1 g/dL (ref 32.0–36.0)
NEUT%: 66.4 % (ref 39.6–76.8)
RBC: 3.32 10*6/uL — ABNORMAL LOW (ref 3.70–5.32)
RDW: 12.9 % (ref 11.3–14.5)
lymph#: 0.7 10*3/uL — ABNORMAL LOW (ref 0.9–3.3)

## 2008-11-20 ENCOUNTER — Encounter: Payer: Self-pay | Admitting: Internal Medicine

## 2008-11-20 ENCOUNTER — Ambulatory Visit: Payer: Self-pay | Admitting: Oncology

## 2008-11-20 LAB — CBC WITH DIFFERENTIAL/PLATELET
Basophils Absolute: 0 10*3/uL (ref 0.0–0.1)
EOS%: 3.2 % (ref 0.0–7.0)
Eosinophils Absolute: 0.1 10*3/uL (ref 0.0–0.5)
HCT: 33.3 % — ABNORMAL LOW (ref 34.8–46.6)
HGB: 11.7 g/dL (ref 11.6–15.9)
LYMPH%: 16.1 % (ref 14.0–48.0)
MCH: 33 pg (ref 26.0–34.0)
MCV: 94.1 fL (ref 81.0–101.0)
MONO%: 7.2 % (ref 0.0–13.0)
NEUT#: 3.1 10*3/uL (ref 1.5–6.5)
NEUT%: 72.7 % (ref 39.6–76.8)
Platelets: 199 10*3/uL (ref 145–400)
RDW: 13 % (ref 11.3–14.5)

## 2008-11-20 LAB — COMPREHENSIVE METABOLIC PANEL
AST: 19 U/L (ref 0–37)
Albumin: 4.2 g/dL (ref 3.5–5.2)
Alkaline Phosphatase: 43 U/L (ref 39–117)
BUN: 12 mg/dL (ref 6–23)
Creatinine, Ser: 1.04 mg/dL (ref 0.40–1.20)
Glucose, Bld: 82 mg/dL (ref 70–99)
Total Bilirubin: 1.3 mg/dL — ABNORMAL HIGH (ref 0.3–1.2)

## 2008-11-20 LAB — CA 125: CA 125: 6.1 U/mL (ref 0.0–30.2)

## 2008-11-20 LAB — TSH: TSH: 2.54 u[IU]/mL (ref 0.350–4.500)

## 2008-12-05 ENCOUNTER — Ambulatory Visit: Payer: Self-pay | Admitting: Cardiology

## 2008-12-11 ENCOUNTER — Ambulatory Visit: Payer: Self-pay

## 2008-12-11 ENCOUNTER — Ambulatory Visit: Payer: Self-pay | Admitting: Cardiology

## 2008-12-11 ENCOUNTER — Encounter: Payer: Self-pay | Admitting: Cardiology

## 2008-12-11 LAB — CONVERTED CEMR LAB
ALT: 18 units/L (ref 0–35)
AST: 19 units/L (ref 0–37)
Alkaline Phosphatase: 30 units/L — ABNORMAL LOW (ref 39–117)
BUN: 12 mg/dL (ref 6–23)
Chloride: 93 meq/L — ABNORMAL LOW (ref 96–112)
Cholesterol: 127 mg/dL (ref 0–200)
Creatinine, Ser: 0.9 mg/dL (ref 0.4–1.2)
GFR calc Af Amer: 78 mL/min
GFR calc non Af Amer: 65 mL/min
LDL Cholesterol: 50 mg/dL (ref 0–99)
Sodium: 131 meq/L — ABNORMAL LOW (ref 135–145)
Total CHOL/HDL Ratio: 1.9

## 2008-12-18 LAB — CBC WITH DIFFERENTIAL/PLATELET
Basophils Absolute: 0 10*3/uL (ref 0.0–0.1)
EOS%: 3.4 % (ref 0.0–7.0)
Eosinophils Absolute: 0.1 10*3/uL (ref 0.0–0.5)
HCT: 31.4 % — ABNORMAL LOW (ref 34.8–46.6)
HGB: 11.1 g/dL — ABNORMAL LOW (ref 11.6–15.9)
MCH: 33.1 pg (ref 26.0–34.0)
NEUT#: 2.6 10*3/uL (ref 1.5–6.5)
NEUT%: 65.2 % (ref 39.6–76.8)
lymph#: 0.9 10*3/uL (ref 0.9–3.3)

## 2009-02-14 ENCOUNTER — Ambulatory Visit: Payer: Self-pay | Admitting: Oncology

## 2009-02-19 LAB — CBC WITH DIFFERENTIAL/PLATELET
BASO%: 1.4 % (ref 0.0–2.0)
Basophils Absolute: 0.1 10*3/uL (ref 0.0–0.1)
HCT: 32.7 % — ABNORMAL LOW (ref 34.8–46.6)
HGB: 11.8 g/dL (ref 11.6–15.9)
MONO#: 0.5 10*3/uL (ref 0.1–0.9)
NEUT%: 64 % (ref 38.4–76.8)
RDW: 12.3 % (ref 11.2–14.5)
WBC: 4.4 10*3/uL (ref 3.9–10.3)
lymph#: 1 10*3/uL (ref 0.9–3.3)

## 2009-04-19 ENCOUNTER — Ambulatory Visit: Payer: Self-pay | Admitting: Oncology

## 2009-04-23 ENCOUNTER — Encounter: Payer: Self-pay | Admitting: Internal Medicine

## 2009-04-23 LAB — CBC WITH DIFFERENTIAL/PLATELET
Basophils Absolute: 0 10*3/uL (ref 0.0–0.1)
Eosinophils Absolute: 0.2 10*3/uL (ref 0.0–0.5)
HGB: 10.7 g/dL — ABNORMAL LOW (ref 11.6–15.9)
MONO#: 0.4 10*3/uL (ref 0.1–0.9)
NEUT#: 2.9 10*3/uL (ref 1.5–6.5)
RBC: 3.38 10*6/uL — ABNORMAL LOW (ref 3.70–5.45)
RDW: 12.9 % (ref 11.2–14.5)
WBC: 4.1 10*3/uL (ref 3.9–10.3)
lymph#: 0.6 10*3/uL — ABNORMAL LOW (ref 0.9–3.3)

## 2009-04-23 LAB — COMPREHENSIVE METABOLIC PANEL
ALT: 10 U/L (ref 0–35)
AST: 15 U/L (ref 0–37)
Alkaline Phosphatase: 42 U/L (ref 39–117)
Creatinine, Ser: 1.03 mg/dL (ref 0.40–1.20)
Total Bilirubin: 0.9 mg/dL (ref 0.3–1.2)

## 2009-04-23 LAB — IRON AND TIBC: %SAT: 16 % — ABNORMAL LOW (ref 20–55)

## 2009-04-23 LAB — CA 125: CA 125: 7.7 U/mL (ref 0.0–30.2)

## 2009-05-10 ENCOUNTER — Ambulatory Visit: Admission: RE | Admit: 2009-05-10 | Discharge: 2009-05-10 | Payer: Self-pay | Admitting: Gynecology

## 2009-05-10 ENCOUNTER — Encounter: Payer: Self-pay | Admitting: Internal Medicine

## 2009-05-30 ENCOUNTER — Encounter: Payer: Self-pay | Admitting: Internal Medicine

## 2009-06-14 ENCOUNTER — Ambulatory Visit: Payer: Self-pay | Admitting: Oncology

## 2009-06-18 LAB — CBC WITH DIFFERENTIAL/PLATELET
BASO%: 0.9 % (ref 0.0–2.0)
EOS%: 3.1 % (ref 0.0–7.0)
HCT: 30.8 % — ABNORMAL LOW (ref 34.8–46.6)
MCHC: 34.7 g/dL (ref 31.5–36.0)
MONO#: 0.4 10*3/uL (ref 0.1–0.9)
NEUT%: 64.3 % (ref 38.4–76.8)
RDW: 12.8 % (ref 11.2–14.5)
WBC: 4.5 10*3/uL (ref 3.9–10.3)
lymph#: 1 10*3/uL (ref 0.9–3.3)
nRBC: 0 % (ref 0–0)

## 2009-07-25 ENCOUNTER — Encounter: Payer: Self-pay | Admitting: Internal Medicine

## 2009-08-16 ENCOUNTER — Ambulatory Visit: Payer: Self-pay | Admitting: Oncology

## 2009-08-20 LAB — CBC WITH DIFFERENTIAL/PLATELET
BASO%: 0.9 % (ref 0.0–2.0)
EOS%: 3.9 % (ref 0.0–7.0)
LYMPH%: 16.5 % (ref 14.0–49.7)
MCH: 31.4 pg (ref 25.1–34.0)
MCHC: 34.4 g/dL (ref 31.5–36.0)
MONO#: 0.4 10*3/uL (ref 0.1–0.9)
Platelets: 144 10*3/uL — ABNORMAL LOW (ref 145–400)
RBC: 3.53 10*6/uL — ABNORMAL LOW (ref 3.70–5.45)
WBC: 5.4 10*3/uL (ref 3.9–10.3)
lymph#: 0.9 10*3/uL (ref 0.9–3.3)

## 2009-08-22 ENCOUNTER — Ambulatory Visit: Payer: Self-pay | Admitting: Internal Medicine

## 2009-08-22 DIAGNOSIS — J309 Allergic rhinitis, unspecified: Secondary | ICD-10-CM | POA: Insufficient documentation

## 2009-10-18 ENCOUNTER — Ambulatory Visit: Payer: Self-pay | Admitting: Oncology

## 2009-10-22 ENCOUNTER — Encounter (INDEPENDENT_AMBULATORY_CARE_PROVIDER_SITE_OTHER): Payer: Self-pay | Admitting: *Deleted

## 2009-10-22 ENCOUNTER — Encounter: Payer: Self-pay | Admitting: Internal Medicine

## 2009-10-22 LAB — CBC WITH DIFFERENTIAL/PLATELET
BASO%: 0.7 % (ref 0.0–2.0)
Basophils Absolute: 0 10*3/uL (ref 0.0–0.1)
EOS%: 1.9 % (ref 0.0–7.0)
HGB: 12.1 g/dL (ref 11.6–15.9)
MCH: 33.7 pg (ref 25.1–34.0)
MCHC: 35.7 g/dL (ref 31.5–36.0)
MCV: 94.6 fL (ref 79.5–101.0)
MONO%: 5.7 % (ref 0.0–14.0)
RDW: 12.9 % (ref 11.2–14.5)
lymph#: 0.6 10*3/uL — ABNORMAL LOW (ref 0.9–3.3)

## 2009-10-22 LAB — COMPREHENSIVE METABOLIC PANEL
ALT: 13 U/L (ref 0–35)
AST: 15 U/L (ref 0–37)
Albumin: 4.3 g/dL (ref 3.5–5.2)
Alkaline Phosphatase: 46 U/L (ref 39–117)
BUN: 16 mg/dL (ref 6–23)
Creatinine, Ser: 1 mg/dL (ref 0.40–1.20)
Potassium: 3.5 mEq/L (ref 3.5–5.3)

## 2009-10-22 LAB — CA 125: CA 125: 9.5 U/mL (ref 0.0–30.2)

## 2009-12-03 ENCOUNTER — Ambulatory Visit: Payer: Self-pay | Admitting: Internal Medicine

## 2009-12-19 ENCOUNTER — Ambulatory Visit: Payer: Self-pay | Admitting: Oncology

## 2009-12-23 LAB — CBC WITH DIFFERENTIAL/PLATELET
Basophils Absolute: 0 10*3/uL (ref 0.0–0.1)
Eosinophils Absolute: 0.1 10*3/uL (ref 0.0–0.5)
HCT: 34.6 % — ABNORMAL LOW (ref 34.8–46.6)
MCH: 32.2 pg (ref 25.1–34.0)
MONO#: 0.5 10*3/uL (ref 0.1–0.9)
NEUT%: 71.8 % (ref 38.4–76.8)
Platelets: 165 10*3/uL (ref 145–400)
WBC: 5.5 10*3/uL (ref 3.9–10.3)
nRBC: 0 % (ref 0–0)

## 2010-01-15 ENCOUNTER — Encounter: Payer: Self-pay | Admitting: Cardiology

## 2010-01-15 ENCOUNTER — Ambulatory Visit: Payer: Self-pay | Admitting: Cardiology

## 2010-01-15 DIAGNOSIS — E785 Hyperlipidemia, unspecified: Secondary | ICD-10-CM | POA: Insufficient documentation

## 2010-01-15 DIAGNOSIS — I420 Dilated cardiomyopathy: Secondary | ICD-10-CM | POA: Insufficient documentation

## 2010-01-17 ENCOUNTER — Encounter: Payer: Self-pay | Admitting: Cardiology

## 2010-02-13 ENCOUNTER — Ambulatory Visit: Payer: Self-pay | Admitting: Oncology

## 2010-02-17 LAB — CBC WITH DIFFERENTIAL/PLATELET
Basophils Absolute: 0 10*3/uL (ref 0.0–0.1)
EOS%: 1.6 % (ref 0.0–7.0)
Eosinophils Absolute: 0.1 10*3/uL (ref 0.0–0.5)
HCT: 33.4 % — ABNORMAL LOW (ref 34.8–46.6)
LYMPH%: 14.9 % (ref 14.0–49.7)
MONO%: 8.9 % (ref 0.0–14.0)
Platelets: 139 10*3/uL — ABNORMAL LOW (ref 145–400)
RDW: 12.7 % (ref 11.2–14.5)
WBC: 5.5 10*3/uL (ref 3.9–10.3)

## 2010-04-18 ENCOUNTER — Ambulatory Visit: Payer: Self-pay | Admitting: Oncology

## 2010-04-21 LAB — CBC WITH DIFFERENTIAL/PLATELET
BASO%: 0.9 % (ref 0.0–2.0)
EOS%: 3.1 % (ref 0.0–7.0)
Eosinophils Absolute: 0.1 10*3/uL (ref 0.0–0.5)
LYMPH%: 19.9 % (ref 14.0–49.7)
MCV: 93.6 fL (ref 79.5–101.0)
NEUT#: 2.5 10*3/uL (ref 1.5–6.5)
Platelets: 206 10*3/uL (ref 145–400)
lymph#: 0.7 10*3/uL — ABNORMAL LOW (ref 0.9–3.3)

## 2010-04-21 LAB — CA 125: CA 125: 6.9 U/mL (ref 0.0–30.2)

## 2010-05-07 ENCOUNTER — Ambulatory Visit: Admission: RE | Admit: 2010-05-07 | Discharge: 2010-05-07 | Payer: Self-pay | Admitting: Gynecology

## 2010-06-19 ENCOUNTER — Encounter: Payer: Self-pay | Admitting: Internal Medicine

## 2010-06-19 ENCOUNTER — Ambulatory Visit: Payer: Self-pay | Admitting: Oncology

## 2010-06-23 LAB — CBC WITH DIFFERENTIAL/PLATELET
Basophils Absolute: 0 10*3/uL (ref 0.0–0.1)
EOS%: 1.9 % (ref 0.0–7.0)
HCT: 31 % — ABNORMAL LOW (ref 34.8–46.6)
MCH: 31.9 pg (ref 25.1–34.0)
MCHC: 34.8 g/dL (ref 31.5–36.0)
NEUT#: 4.7 10*3/uL (ref 1.5–6.5)
Platelets: 141 10*3/uL — ABNORMAL LOW (ref 145–400)
RBC: 3.39 10*6/uL — ABNORMAL LOW (ref 3.70–5.45)
RDW: 12.7 % (ref 11.2–14.5)
WBC: 6.3 10*3/uL (ref 3.9–10.3)
lymph#: 0.9 10*3/uL (ref 0.9–3.3)
nRBC: 0 % (ref 0–0)

## 2010-06-26 ENCOUNTER — Emergency Department (HOSPITAL_BASED_OUTPATIENT_CLINIC_OR_DEPARTMENT_OTHER): Admission: EM | Admit: 2010-06-26 | Discharge: 2010-06-26 | Payer: Self-pay | Admitting: Emergency Medicine

## 2010-06-26 ENCOUNTER — Ambulatory Visit: Payer: Self-pay | Admitting: Diagnostic Radiology

## 2010-06-29 ENCOUNTER — Emergency Department (HOSPITAL_BASED_OUTPATIENT_CLINIC_OR_DEPARTMENT_OTHER): Admission: EM | Admit: 2010-06-29 | Discharge: 2010-06-29 | Payer: Self-pay | Admitting: Emergency Medicine

## 2010-06-29 ENCOUNTER — Ambulatory Visit: Payer: Self-pay | Admitting: Diagnostic Radiology

## 2010-07-03 ENCOUNTER — Ambulatory Visit: Payer: Self-pay | Admitting: Internal Medicine

## 2010-07-03 DIAGNOSIS — M549 Dorsalgia, unspecified: Secondary | ICD-10-CM | POA: Insufficient documentation

## 2010-07-07 ENCOUNTER — Ambulatory Visit: Payer: Self-pay | Admitting: Internal Medicine

## 2010-07-08 ENCOUNTER — Ambulatory Visit: Payer: Self-pay | Admitting: Internal Medicine

## 2010-07-15 ENCOUNTER — Ambulatory Visit: Payer: Self-pay | Admitting: Internal Medicine

## 2010-08-06 ENCOUNTER — Ambulatory Visit: Payer: Self-pay | Admitting: Internal Medicine

## 2010-08-06 DIAGNOSIS — D2330 Other benign neoplasm of skin of unspecified part of face: Secondary | ICD-10-CM | POA: Insufficient documentation

## 2010-09-01 ENCOUNTER — Ambulatory Visit: Payer: Self-pay | Admitting: Oncology

## 2010-09-03 ENCOUNTER — Encounter: Payer: Self-pay | Admitting: Internal Medicine

## 2010-09-18 ENCOUNTER — Emergency Department (HOSPITAL_BASED_OUTPATIENT_CLINIC_OR_DEPARTMENT_OTHER): Admission: EM | Admit: 2010-09-18 | Discharge: 2010-09-18 | Payer: Self-pay | Admitting: Emergency Medicine

## 2010-09-23 ENCOUNTER — Ambulatory Visit: Payer: Self-pay | Admitting: Internal Medicine

## 2010-09-23 DIAGNOSIS — R55 Syncope and collapse: Secondary | ICD-10-CM | POA: Insufficient documentation

## 2010-09-24 LAB — CONVERTED CEMR LAB
Albumin: 3.8 g/dL (ref 3.5–5.2)
BUN: 14 mg/dL (ref 6–23)
Basophils Absolute: 0 10*3/uL (ref 0.0–0.1)
Basophils Relative: 0.4 % (ref 0.0–3.0)
Chloride: 90 meq/L — ABNORMAL LOW (ref 96–112)
Eosinophils Absolute: 0.1 10*3/uL (ref 0.0–0.7)
Eosinophils Relative: 1.8 % (ref 0.0–5.0)
GFR calc non Af Amer: 63.71 mL/min (ref >60)
Glucose, Bld: 94 mg/dL (ref 70–99)
HCT: 31.3 % — ABNORMAL LOW (ref 36.0–46.0)
Hemoglobin: 10.9 g/dL — ABNORMAL LOW (ref 12.0–15.0)
Lymphs Abs: 0.8 10*3/uL (ref 0.7–4.0)
MCHC: 34.7 g/dL (ref 30.0–36.0)
MCV: 95.5 fL (ref 78.0–100.0)
RDW: 13.4 % (ref 11.5–14.6)
Total Bilirubin: 1.4 mg/dL — ABNORMAL HIGH (ref 0.3–1.2)
WBC: 4.4 10*3/uL — ABNORMAL LOW (ref 4.5–10.5)

## 2010-09-26 ENCOUNTER — Ambulatory Visit: Payer: Self-pay | Admitting: Family Medicine

## 2010-09-29 ENCOUNTER — Telehealth (INDEPENDENT_AMBULATORY_CARE_PROVIDER_SITE_OTHER): Payer: Self-pay | Admitting: *Deleted

## 2010-10-03 ENCOUNTER — Ambulatory Visit: Payer: Self-pay | Admitting: Family Medicine

## 2010-10-08 ENCOUNTER — Telehealth: Payer: Self-pay | Admitting: Internal Medicine

## 2010-10-08 ENCOUNTER — Ambulatory Visit: Payer: Self-pay | Admitting: Internal Medicine

## 2010-10-21 ENCOUNTER — Emergency Department (HOSPITAL_BASED_OUTPATIENT_CLINIC_OR_DEPARTMENT_OTHER)
Admission: EM | Admit: 2010-10-21 | Discharge: 2010-10-21 | Payer: Self-pay | Source: Home / Self Care | Admitting: Emergency Medicine

## 2010-11-05 ENCOUNTER — Ambulatory Visit: Payer: Self-pay | Admitting: Internal Medicine

## 2010-11-05 DIAGNOSIS — M25539 Pain in unspecified wrist: Secondary | ICD-10-CM | POA: Insufficient documentation

## 2010-11-12 ENCOUNTER — Ambulatory Visit
Admission: RE | Admit: 2010-11-12 | Discharge: 2010-11-12 | Payer: Self-pay | Source: Home / Self Care | Attending: Internal Medicine | Admitting: Internal Medicine

## 2010-11-12 DIAGNOSIS — M65849 Other synovitis and tenosynovitis, unspecified hand: Secondary | ICD-10-CM

## 2010-11-12 DIAGNOSIS — M65839 Other synovitis and tenosynovitis, unspecified forearm: Secondary | ICD-10-CM | POA: Insufficient documentation

## 2010-12-05 ENCOUNTER — Other Ambulatory Visit: Payer: Self-pay | Admitting: Internal Medicine

## 2010-12-05 ENCOUNTER — Ambulatory Visit
Admission: RE | Admit: 2010-12-05 | Discharge: 2010-12-05 | Payer: Self-pay | Source: Home / Self Care | Attending: Internal Medicine | Admitting: Internal Medicine

## 2010-12-05 LAB — HEPATIC FUNCTION PANEL
ALT: 15 U/L (ref 0–35)
AST: 16 U/L (ref 0–37)
Albumin: 4.1 g/dL (ref 3.5–5.2)
Alkaline Phosphatase: 53 U/L (ref 39–117)
Bilirubin, Direct: 0.2 mg/dL (ref 0.0–0.3)
Total Bilirubin: 1.4 mg/dL — ABNORMAL HIGH (ref 0.3–1.2)
Total Protein: 6.7 g/dL (ref 6.0–8.3)

## 2010-12-05 LAB — CBC WITH DIFFERENTIAL/PLATELET
Basophils Absolute: 0 10*3/uL (ref 0.0–0.1)
Basophils Relative: 0.2 % (ref 0.0–3.0)
Eosinophils Absolute: 0.1 10*3/uL (ref 0.0–0.7)
Eosinophils Relative: 1.2 % (ref 0.0–5.0)
HCT: 36 % (ref 36.0–46.0)
Hemoglobin: 12.6 g/dL (ref 12.0–15.0)
Lymphocytes Relative: 12.8 % (ref 12.0–46.0)
Lymphs Abs: 0.8 10*3/uL (ref 0.7–4.0)
MCHC: 34.9 g/dL (ref 30.0–36.0)
MCV: 94.7 fl (ref 78.0–100.0)
Monocytes Absolute: 0.4 10*3/uL (ref 0.1–1.0)
Monocytes Relative: 6.2 % (ref 3.0–12.0)
Neutro Abs: 4.7 10*3/uL (ref 1.4–7.7)
Neutrophils Relative %: 79.6 % — ABNORMAL HIGH (ref 43.0–77.0)
Platelets: 172 10*3/uL (ref 150.0–400.0)
RBC: 3.8 Mil/uL — ABNORMAL LOW (ref 3.87–5.11)
RDW: 13.6 % (ref 11.5–14.6)
WBC: 5.9 10*3/uL (ref 4.5–10.5)

## 2010-12-05 LAB — BASIC METABOLIC PANEL
BUN: 13 mg/dL (ref 6–23)
CO2: 31 mEq/L (ref 19–32)
Calcium: 9.4 mg/dL (ref 8.4–10.5)
Chloride: 99 mEq/L (ref 96–112)
Creatinine, Ser: 0.9 mg/dL (ref 0.4–1.2)
GFR: 63.68 mL/min (ref >60.00)
Glucose, Bld: 91 mg/dL (ref 70–99)
Potassium: 3.5 mEq/L (ref 3.5–5.1)
Sodium: 138 mEq/L (ref 135–145)

## 2010-12-05 LAB — LIPID PANEL
Cholesterol: 154 mg/dL (ref 0–200)
HDL: 80.8 mg/dL (ref >39.00)
LDL Cholesterol: 60 mg/dL (ref 0–99)
Total CHOL/HDL Ratio: 2
Triglycerides: 67 mg/dL (ref 0.0–149.0)
VLDL: 13.4 mg/dL (ref 0.0–40.0)

## 2010-12-05 LAB — TSH: TSH: 1.23 u[IU]/mL (ref 0.35–5.50)

## 2010-12-06 ENCOUNTER — Encounter: Payer: Self-pay | Admitting: Oncology

## 2010-12-18 NOTE — Assessment & Plan Note (Signed)
Summary: elevated BP/dm   Vital Signs:  Patient profile:   77 year old female Height:      63 inches (160.02 cm) Weight:      106.31 pounds (48.32 kg) O2 Sat:      99 % on Room air Temp:     97.9 degrees F (36.61 degrees C) oral Pulse rate:   90 / minute BP sitting:   164 / 78  (left arm) Cuff size:   regular  Vitals Entered By: Josph Macho RMA (September 26, 2010 9:07 AM)  O2 Flow:  Room air CC: Elevated BP (162/125 this AM)/ CF Is Patient Diabetic? No   History of Present Illness: 77 y/o WF here for elevated BP at home.  Has not taken her coreg or lisinopril yet today. Since her o/v here a few days ago, her BP checks at home have been high intermittently--160's/120s at times.  Feels a vague ache in back of head when BP up, otherwise asymptomatic.   BP meds were changed last o/v due to orthostatic syncope---- her 12.5mg  of HCTZ was d/c'd. Denies CP, SOB, dizziness/syncope.  NO LE swelling, no vision changes.  No OTC decongestants.  She is finishing up a course of cephalexin for a UTI diagnosed at an outside facility---says ALL of her UTI symptoms are completely resolved now.  Current Medications (verified): 1)  Fosamax 70 Mg  Tabs (Alendronate Sodium) .Marland Kitchen.. 1 Q Week 2)  Ultram 50 Mg  Tabs (Tramadol Hcl) .Marland Kitchen.. 1 Q6h As Needed 3)  Coreg 25 Mg  Tabs (Carvedilol) .... One Twice Daily 4)  Zocor 40 Mg  Tabs (Simvastatin) .Marland Kitchen.. 1 Once Daily 5)  Flonase 50 Mcg/act Susp (Fluticasone Propionate) .... As Needed 6)  Aspirin 81 Mg Tbec (Aspirin) .... Take One Tablet By Mouth Daily 7)  Stool Softener 100 Mg Caps (Docusate Sodium) .... Take 1 Capsule By Mouth Two Times A Day 8)  Calcium Carbonate-Vitamin D 600-400 Mg-Unit  Tabs (Calcium Carbonate-Vitamin D) .... Take 1 Tablet By Mouth Two Times A Day 9)  Fish Oil 1000 Mg Caps (Omega-3 Fatty Acids) .... Take 1 Capsule By Mouth Two Times A Day 10)  Coq10 100 Mg Caps (Coenzyme Q10) .... Take 1 Capsule By Mouth Once A Day 11)  Multivitamins   Tabs (Multiple Vitamin) .... Take 1 Tablet By Mouth Once A Day 12)  Diazepam 2 Mg Tabs (Diazepam) .... Take One Every 8 To 12 Hours For Muscle Pain 13)  Hydrocodone-Acetaminophen 5-325 Mg Tabs (Hydrocodone-Acetaminophen) .... Use As Directed 14)  Cephalexin 500 Mg Caps (Cephalexin) .... Qid 15)  Lisinopril 20 Mg Tabs (Lisinopril) .... One Daily 16)  Klor-Con M20 20 Meq Cr-Tabs (Potassium Chloride Crys Cr) .Marland Kitchen.. 1 By Mouth Bid  Allergies (verified): 1)  ! Ms Contin (Morphine Sulfate) 2)  ! Ibuprofen  Past History:  Past medical, surgical, family and social histories (including risk factors) reviewed, and no changes noted (except as noted below).  Past Medical History: Reviewed history from 09/23/2010 and no changes required. Breast cancer, hx of  1994 (T3 N1 Mx) Ovarian CA, 2004-Clear cel-l Stage IA Coronary artery disease Hypertension Cardiomyopathy now improved Lymphedema Colonic polyps, hx of left bundle branch block COPD osteoporosis Hyperlipidemia Allergic rhinitis orthostatic syncope, November 2010  Past Surgical History: Reviewed history from 07/13/2007 and no changes required. Hysterectomy Mastectomy Cataract extraction Cholecystectomy L shoulder prosthesis R ankle ORIF  Family History: Reviewed history from 09/08/2007 and no changes required. the father died age 68 MI mother died age 70  MI one brother casualty of the [place] war 4 sisters, history of breast cancer  Social History: Reviewed history from 11/29/2008 and no changes required. Married Never Smoked Alcohol Use - no  Review of Systems       see HPI  Physical Exam  General:  VS: noted, all normal. Gen: Alert, well appearing, oriented x 4. Neck: supple.  No lymphadenopathy, thyromegaly, or mass.  Carotids 2+ bilat. Chest: symmetric expansion, with nonlabored respirations.  Clear and equal breath sounds in all lung fields.   CV: RRR, no m/r/g.  Peripheral pulses 2+/symmetric. EXT: no  clubbing, cyanosis, or edema.   .   Impression & Recommendations:  Problem # 1:  HYPERTENSION (ICD-401.9) Uncontrolled.  Will go ahead and increase her lisinopril to 40mg  once daily.  She'll check her bp at home as usual and call report in 3d, at which time we'll likely titrate her coreg up or add a third agent.  She knows to avoid OTC decongestants. Of note, she had a UTI recently and is finishing cephalexin.  She says all of her UTI symptoms have resolved.  Advised pt to finish abx as prescribed, no further urine testing needed.  Complete Medication List: 1)  Fosamax 70 Mg Tabs (Alendronate sodium) .Marland Kitchen.. 1 q week 2)  Ultram 50 Mg Tabs (Tramadol hcl) .Marland Kitchen.. 1 q6h as needed 3)  Coreg 25 Mg Tabs (Carvedilol) .... One twice daily 4)  Zocor 40 Mg Tabs (Simvastatin) .Marland Kitchen.. 1 once daily 5)  Flonase 50 Mcg/act Susp (Fluticasone propionate) .... As needed 6)  Aspirin 81 Mg Tbec (Aspirin) .... Take one tablet by mouth daily 7)  Stool Softener 100 Mg Caps (Docusate sodium) .... Take 1 capsule by mouth two times a day 8)  Calcium Carbonate-vitamin D 600-400 Mg-unit Tabs (Calcium carbonate-vitamin d) .... Take 1 tablet by mouth two times a day 9)  Fish Oil 1000 Mg Caps (Omega-3 fatty acids) .... Take 1 capsule by mouth two times a day 10)  Coq10 100 Mg Caps (Coenzyme q10) .... Take 1 capsule by mouth once a day 11)  Multivitamins Tabs (Multiple vitamin) .... Take 1 tablet by mouth once a day 12)  Diazepam 2 Mg Tabs (Diazepam) .... Take one every 8 to 12 hours for muscle pain 13)  Hydrocodone-acetaminophen 5-325 Mg Tabs (Hydrocodone-acetaminophen) .... Use as directed 14)  Cephalexin 500 Mg Caps (Cephalexin) .... Qid 15)  Klor-con M20 20 Meq Cr-tabs (Potassium chloride crys cr) .Marland Kitchen.. 1 by mouth bid 16)  Lisinopril 40 Mg Tabs (Lisinopril) .Marland Kitchen.. 1 tab by mouth qd  Patient Instructions: 1)  Please schedule a follow-up appointment in 7 days. 2)  Take two of your 20mg  lisinopril tabs per day for the next 3d,  call on monday with report of your BPs. Prescriptions: LISINOPRIL 40 MG TABS (LISINOPRIL) 1 tab by mouth qd  #30 x 5   Entered and Authorized by:   Michell Heinrich M.D.   Signed by:   Michell Heinrich M.D. on 09/26/2010   Method used:   Electronically to        [facility] (retail)       [address]       [place], [place]  [address]       Ph: [phone]       Fax: [phone]   RxID:   [id number]    Orders Added: 1)  Est. Patient Level III [84132]

## 2010-12-18 NOTE — Assessment & Plan Note (Signed)
Summary: follow up/cjr   Vital Signs:  Patient profile:   77 year old female Weight:      105 pounds BP sitting:   150 / 76  (left arm) Cuff size:   regular  Vitals Entered By: Raechel Ache, RN (December 03, 2009 11:25 AM) CC: ROV   CC:  ROV.  History of Present Illness: 77 year old patient who is seen today for follow-up.  She is followed closely by cardiology and oncology.  She has hypertension, dyslipidemia, coronary artery disease.  She is adjusting to the death of her husband last month.  She has osteoporosis treated with alendronate.  She is requesting a follow-up bone density study.  She denies any cardiopulmonary complaints and is scheduled to see cardiology later on this month.  Allergies: 1)  ! Ms Contin (Morphine Sulfate)  Past History:  Past Medical History: Reviewed history from 08/22/2009 and no changes required. Breast cancer, hx of  1994 Ovarian CA Coronary artery disease Hypertension Cardiomyopathy Lymphedema Colonic polyps, hx of left bundle branch block left ventricular dysfunction ( ejection fraction 35%) COPD osteoporosis Hyperlipidemia Allergic rhinitis  Past Surgical History: Reviewed history from 07/13/2007 and no changes required. Hysterectomy Mastectomy Cataract extraction Cholecystectomy L shoulder prosthesis R ankle ORIF  Family History: Reviewed history from 09/08/2007 and no changes required. the father died age 74 MI mother died age 48 MI one brother casualty of the [place] war 4 sisters, history of breast cancer  Review of Systems  The patient denies anorexia, fever, weight loss, weight gain, vision loss, decreased hearing, hoarseness, chest pain, syncope, dyspnea on exertion, peripheral edema, prolonged cough, headaches, hemoptysis, abdominal pain, melena, hematochezia, severe indigestion/heartburn, hematuria, incontinence, genital sores, muscle weakness, suspicious skin lesions, transient blindness, difficulty walking,  depression, unusual weight change, abnormal bleeding, enlarged lymph nodes, angioedema, and breast masses.    Physical Exam  General:  Well-developed,well-nourished,in no acute distress; alert,appropriate and cooperative throughout examination; 120/78 Head:  Normocephalic and atraumatic without obvious abnormalities. No apparent alopecia or balding. Eyes:  No corneal or conjunctival inflammation noted. EOMI. Perrla. Funduscopic exam benign, without hemorrhages, exudates or papilledema. Vision grossly normal. Mouth:  Oral mucosa and oropharynx without lesions or exudates.  Teeth in good repair. Neck:  No deformities, masses, or tenderness noted. Lungs:  Normal respiratory effort, chest expands symmetrically. Lungs are clear to auscultation, no crackles or wheezes. Heart:  Normal rate and regular rhythm. S1 and S2 normal without gallop, murmur, click, rub or other extra sounds. Abdomen:  Bowel sounds positive,abdomen soft and non-tender without masses, organomegaly or hernias noted. Msk:  No deformity or scoliosis noted of thoracic or lumbar spine.   Pulses:  R and L carotid,radial,femoral,dorsalis pedis and posterior tibial pulses are full and equal bilaterally Extremities:  No clubbing, cyanosis, edema, or deformity noted with normal full range of motion of all joints.   Skin:  Intact without suspicious lesions or rashes Cervical Nodes:  No lymphadenopathy noted   Impression & Recommendations:  Problem # 1:  ALLERGIC RHINITIS (ICD-477.9)  Her updated medication list for this problem includes:    Fexofenadine Hcl 180 Mg Tabs (Fexofenadine hcl) ..... One daily    Flonase 50 Mcg/act Susp (Fluticasone propionate) ..... Uad  Her updated medication list for this problem includes:    Fexofenadine Hcl 180 Mg Tabs (Fexofenadine hcl) ..... One daily    Flonase 50 Mcg/act Susp (Fluticasone propionate) ..... Uad  Problem # 2:  OSTEOPOROSIS (ICD-733.00)  Her updated medication list for this  problem includes:  Fosamax 70 Mg Tabs (Alendronate sodium) .Marland Kitchen... 1 q week    Her updated medication list for this problem includes:    Fosamax 70 Mg Tabs (Alendronate sodium) .Marland Kitchen... 1 q week  Orders: Radiology Referral (Radiology)  Problem # 3:  COPD (ICD-496)  Problem # 4:  HYPERTENSION (ICD-401.9)  Her updated medication list for this problem includes:    Coreg 25 Mg Tabs (Carvedilol) ..... One twice daily    Hydrochlorothiazide 12.5 Mg Tabs (Hydrochlorothiazide) .Marland Kitchen... 1 once daily    Lisinopril 20 Mg Tabs (Lisinopril) .Marland Kitchen... 1 once daily    Her updated medication list for this problem includes:    Coreg 25 Mg Tabs (Carvedilol) ..... One twice daily    Hydrochlorothiazide 12.5 Mg Tabs (Hydrochlorothiazide) .Marland Kitchen... 1 once daily    Lisinopril 20 Mg Tabs (Lisinopril) .Marland Kitchen... 1 once daily  Orders: Prescription Created Electronically [phone])  Problem # 5:  CORONARY ARTERY DISEASE (ICD-414.00)  Her updated medication list for this problem includes:    Coreg 25 Mg Tabs (Carvedilol) ..... One twice daily    Hydrochlorothiazide 12.5 Mg Tabs (Hydrochlorothiazide) .Marland Kitchen... 1 once daily    Lisinopril 20 Mg Tabs (Lisinopril) .Marland Kitchen... 1 once daily    Aspir-low 81 Mg Tbec (Aspirin) .Marland Kitchen... 1 once daily  Her updated medication list for this problem includes:    Coreg 25 Mg Tabs (Carvedilol) ..... One twice daily    Hydrochlorothiazide 12.5 Mg Tabs (Hydrochlorothiazide) .Marland Kitchen... 1 once daily    Lisinopril 20 Mg Tabs (Lisinopril) .Marland Kitchen... 1 once daily    Aspir-low 81 Mg Tbec (Aspirin) .Marland Kitchen... 1 once daily  Complete Medication List: 1)  Fosamax 70 Mg Tabs (Alendronate sodium) .Marland Kitchen.. 1 q week 2)  Ultram 50 Mg Tabs (Tramadol hcl) .Marland Kitchen.. 1 q6h as needed 3)  Coreg 25 Mg Tabs (Carvedilol) .... One twice daily 4)  Zocor 40 Mg Tabs (Simvastatin) .Marland Kitchen.. 1 once daily 5)  Hydrochlorothiazide 12.5 Mg Tabs (Hydrochlorothiazide) .Marland Kitchen.. 1 once daily 6)  Lisinopril 20 Mg Tabs (Lisinopril) .Marland Kitchen.. 1 once daily 7)  Fexofenadine  Hcl 180 Mg Tabs (Fexofenadine hcl) .... One daily 8)  Hydrocodone-homatropine 5-1.5 Mg/53ml Syrp (Hydrocodone-homatropine) .Marland Kitchen.. 1 teaspoon every 6 hours as needed for cough 9)  Flonase 50 Mcg/act Susp (Fluticasone propionate) .... Uad 10)  Aspir-low 81 Mg Tbec (Aspirin) .Marland Kitchen.. 1 once daily  Patient Instructions: 1)  Please schedule a follow-up appointment in 1 year. 2)  Limit your Sodium (Salt). 3)  It is important that you exercise regularly at least 20 minutes 5 times a week. If you develop chest pain, have severe difficulty breathing, or feel very tired , stop exercising immediately and seek medical attention. Prescriptions: LISINOPRIL 20 MG  TABS (LISINOPRIL) 1 once daily  #90 x 6   Entered and Authorized by:   Gordy Savers  MD   Signed by:   Gordy Savers  MD on 12/03/2009   Method used:   Electronically to        [facility] (retail)       [address]       [place], [place]  [address]       Ph: [phone]       Fax: [phone]   RxID:   [id number] HYDROCHLOROTHIAZIDE 12.5 MG  TABS (HYDROCHLOROTHIAZIDE) 1 once daily  #90 x 6   Entered and Authorized by:   Gordy Savers  MD   Signed by:   Gordy Savers  MD on 12/03/2009   Method used:   Electronically to  [facility] (retail)       [address]       [place], [place]  [address]       Ph: [phone]       Fax: [phone]   RxID:   [id number] ZOCOR 40 MG  TABS (SIMVASTATIN) 1 once daily  #90 x 6   Entered and Authorized by:   Gordy Savers  MD   Signed by:   Gordy Savers  MD on 12/03/2009   Method used:   Electronically to        [facility]* (retail)       [address]       [place], [place]  [address]       Ph: [phone]       Fax: [phone]   RxID:   [id number] Janean Sark 50 MG  TABS (TRAMADOL HCL) 1 q6h as needed  #90 x 6   Entered and Authorized by:   Gordy Savers  MD   Signed by:   Gordy Savers  MD on 12/03/2009   Method used:   Electronically to         [facility] (retail)       [address]       [place], [place]  [address]       Ph: [phone]       Fax: [phone]   RxID:   [id number] FOSAMAX 70 MG  TABS (ALENDRONATE SODIUM) 1 q week  #12 x 6   Entered and Authorized by:   Gordy Savers  MD   Signed by:   Gordy Savers  MD on 12/03/2009   Method used:   Electronically to        [facility] (retail)       [address]       [place], [place]  [address]       Ph: [phone]       Fax: [phone]   RxID:   [id number]

## 2010-12-18 NOTE — Assessment & Plan Note (Signed)
Summary: 1 month rov/njr   Vital Signs:  Patient profile:   77 year old female Weight:      108 pounds Temp:     98.0 degrees F oral BP sitting:   150 / 70  (right arm) Cuff size:   regular  Vitals Entered By: Duard Brady LPN (November 05, 2010 11:27 AM) CC: 1 mos rov - ok - BP concerns , (R) wrist pain Is Patient Diabetic? No   Primary Care Provider:  Gordy Savers  MD  CC:  1 mos rov - ok - BP concerns  and (R) wrist pain.  History of Present Illness: 10 patient who is for follow of her blood pressure.  Last visit her amlodipine 10 mg was discontinued and systolic readings are running slightly high with low normal diastolics.  She feels well and has had no further pedal edema or hypotensive readings.  She has developed some right wrist pain  Allergies: 1)  ! Ms Contin (Morphine Sulfate) 2)  ! Ibuprofen  Physical Exam  General:  Well-developed,well-nourished,in no acute distress; alert,appropriate and cooperative throughout examination; 144/70 Msk:  slight swelling involving the palmar aspect of the right wrist radial aspect.  Unclear whether this represents a small ganglion or soft tissue swelling   Impression & Recommendations:  Problem # 1:  HYPERTENSION (ICD-401.9)  Her updated medication list for this problem includes:    Coreg 25 Mg Tabs (Carvedilol) ..... One twice daily    Lisinopril 20 Mg Tabs (Lisinopril) ..... One twice daily    Amlodipine Besylate 2.5 Mg Tabs (Amlodipine besylate) ..... One every morning  Problem # 2:  WRIST PAIN, RIGHT (ICD-719.43)  Complete Medication List: 1)  Ultram 50 Mg Tabs (Tramadol hcl) .Marland Kitchen.. 1 q6h as needed 2)  Coreg 25 Mg Tabs (Carvedilol) .... One twice daily 3)  Zocor 40 Mg Tabs (Simvastatin) .Marland Kitchen.. 1 once daily 4)  Flonase 50 Mcg/act Susp (Fluticasone propionate) .... As needed 5)  Aspirin 81 Mg Tbec (Aspirin) .... Take one tablet by mouth daily 6)  Stool Softener 100 Mg Caps (Docusate sodium) .... Take 1 capsule  by mouth two times a day 7)  Calcium Carbonate-vitamin D 600-400 Mg-unit Tabs (Calcium carbonate-vitamin d) .... Take 1 tablet by mouth two times a day 8)  Fish Oil 1000 Mg Caps (Omega-3 fatty acids) .... Take 1 capsule by mouth two times a day 9)  Coq10 100 Mg Caps (Coenzyme q10) .... Take 1 capsule by mouth once a day 10)  Multivitamins Tabs (Multiple vitamin) .... Take 1 tablet by mouth once a day 11)  Diazepam 2 Mg Tabs (Diazepam) .... Take one every 8 to 12 hours for muscle pain 12)  Hydrocodone-acetaminophen 5-325 Mg Tabs (Hydrocodone-acetaminophen) .... Use as directed 13)  Lisinopril 20 Mg Tabs (Lisinopril) .... One twice daily 14)  Amlodipine Besylate 2.5 Mg Tabs (Amlodipine besylate) .... One every morning  Patient Instructions: 1)  Please schedule a follow-up appointment in 3 months. 2)  Limit your Sodium (Salt) to less than 2 grams a day(slightly less than 1/2 a teaspoon) to prevent fluid retention, swelling, or worsening of symptoms. 3)  It is important that you exercise regularly at least 20 minutes 5 times a week. If you develop chest pain, have severe difficulty breathing, or feel very tired , stop exercising immediately and seek medical attention.   Orders Added: 1)  Est. Patient Level III [14782]

## 2010-12-18 NOTE — Assessment & Plan Note (Signed)
Summary: EVAL OF SORE ON L HAND // RS   Vital Signs:  Patient profile:   77 year old female Weight:      107 pounds Temp:     98.1 degrees F oral BP sitting:   100 / 62  (right arm) Cuff size:   regular  Vitals Entered By: Duard Brady LPN (August 06, 2010 9:18 AM) CC: here to have knot on (L) upper hand checked Is Patient Diabetic? No Flu Vaccine Consent Questions     Do you have a history of severe allergic reactions to this vaccine? no    Any prior history of allergic reactions to egg and/or gelatin? no    Do you have a sensitivity to the preservative Thimersol? no    Do you have a past history of Guillan-Barre Syndrome? no    Do you currently have an acute febrile illness? no    Have you ever had a severe reaction to latex? no    Vaccine information given and explained to patient? yes    Are you currently pregnant? no    Lot Number:[id number]   Exp Date:05/16/2011   Site Given  Left Deltoid IM   CC:  here to have knot on (L) upper hand checked.  History of Present Illness: 77 year old patient who is seen today with a chief complaint of a lesion involving the dorsal aspect of her left hand.  She states that she has had an enlarging nodule over the past 3 months.  She does receive annual dermatological follow-up. Her back, chest wall pain has largely resolved.  She has a history of coronary artery disease, which has been stable.  Allergies: 1)  ! Ms Contin (Morphine Sulfate) 2)  ! Ibuprofen  Past History:  Past Medical History: Breast cancer, hx of  1994 (T3 N1 Mx) Ovarian CA, 2004-Clear cel-l Stage IA Coronary artery disease Hypertension Cardiomyopathy now improved Lymphedema Colonic polyps, hx of left bundle branch block COPD osteoporosis Hyperlipidemia Allergic rhinitis  Past Surgical History: Reviewed history from 07/13/2007 and no changes required. Hysterectomy Mastectomy Cataract extraction Cholecystectomy L shoulder prosthesis R ankle  ORIF  Review of Systems       The patient complains of suspicious skin lesions.  The patient denies anorexia, fever, weight loss, weight gain, vision loss, decreased hearing, hoarseness, chest pain, syncope, dyspnea on exertion, peripheral edema, prolonged cough, headaches, hemoptysis, abdominal pain, melena, hematochezia, severe indigestion/heartburn, hematuria, incontinence, genital sores, muscle weakness, transient blindness, difficulty walking, depression, unusual weight change, abnormal bleeding, enlarged lymph nodes, angioedema, and breast masses.    Physical Exam  General:  Well-developed,well-nourished,in no acute distress; alert,appropriate and cooperative throughout examination Skin:  an approximate 5 x 6 mm flesh colored oval papule involving the dorsum of the left hand, just proximal to the left fifth MCP joint   Impression & Recommendations:  Problem # 1:  BENIGN NEOPLASM SKIN OTHER&UNSPEC PARTS FACE (ICD-216.3) this papule  is nonspecific and has enlarged fairly rapidly over the past 3 months;  will follow-up with her dermatologist  Problem # 2:  BACK PAIN (ICD-724.5)  Her updated medication list for this problem includes:    Ultram 50 Mg Tabs (Tramadol hcl) .Marland Kitchen... 1 q6h as needed    Aspirin 81 Mg Tbec (Aspirin) .Marland Kitchen... Take one tablet by mouth daily    Hydrocodone-acetaminophen 5-325 Mg Tabs (Hydrocodone-acetaminophen) ..... Use as directed  Her updated medication list for this problem includes:    Ultram 50 Mg Tabs (Tramadol hcl) .Marland Kitchen... 1 q6h as needed  Aspirin 81 Mg Tbec (Aspirin) .Marland Kitchen... Take one tablet by mouth daily    Hydrocodone-acetaminophen 5-325 Mg Tabs (Hydrocodone-acetaminophen) ..... Use as directed  Complete Medication List: 1)  Fosamax 70 Mg Tabs (Alendronate sodium) .Marland Kitchen.. 1 q week 2)  Ultram 50 Mg Tabs (Tramadol hcl) .Marland Kitchen.. 1 q6h as needed 3)  Coreg 25 Mg Tabs (Carvedilol) .... One twice daily 4)  Zocor 40 Mg Tabs (Simvastatin) .Marland Kitchen.. 1 once daily 5)   Lisinopril-hydrochlorothiazide 20-12.5 Mg Tabs (Lisinopril-hydrochlorothiazide) .... Take 1 tablet by mouth once a day 6)  Hydrocodone-homatropine 5-1.5 Mg/7ml Syrp (Hydrocodone-homatropine) .Marland Kitchen.. 1 teaspoon every 6 hours as needed for cough 7)  Flonase 50 Mcg/act Susp (Fluticasone propionate) .... As needed 8)  Aspirin 81 Mg Tbec (Aspirin) .... Take one tablet by mouth daily 9)  Stool Softener 100 Mg Caps (Docusate sodium) .... Take 1 capsule by mouth two times a day 10)  Calcium Carbonate-vitamin D 600-400 Mg-unit Tabs (Calcium carbonate-vitamin d) .... Take 1 tablet by mouth two times a day 11)  Fish Oil 1000 Mg Caps (Omega-3 fatty acids) .... Take 1 capsule by mouth two times a day 12)  Coq10 100 Mg Caps (Coenzyme q10) .... Take 1 capsule by mouth once a day 13)  Multivitamins Tabs (Multiple vitamin) .... Take 1 tablet by mouth once a day 14)  Diazepam 2 Mg Tabs (Diazepam) .... Take one every 8 to 12 hours for muscle pain 15)  Hydrocodone-acetaminophen 5-325 Mg Tabs (Hydrocodone-acetaminophen) .... Use as directed  Other Orders: Admin 1st Vaccine (65784) Flu Vaccine 69yrs + ([address]29)  Patient Instructions: 1)  follow-up with dermatology at your convenience 2)  Please schedule a follow-up appointment as needed.

## 2010-12-18 NOTE — Assessment & Plan Note (Signed)
Summary: F/U ON BACK PAIN // RS   Vital Signs:  Patient profile:   77 year old female Weight:      106 pounds Temp:     98.1 degrees F oral BP sitting:   110 / 68  (right arm) Cuff size:   regular  Vitals Entered By: Duard Brady LPN (July 15, 2010 12:49 PM) CC: f/u on back pain - improving Is Patient Diabetic? No   CC:  f/u on back pain - improving.  History of Present Illness: 77 year old patient who is seen today for follow-up of right posterior back pain.  She has head two  urgent care visits and radiographic evaluation has included a chest x-ray and presumably x-rays of the thoracic and lumbar spine; a follow-up  urgent care visit included a CT abdominal scan to apparently rule out asymptomatic renal stone.  She was seen here and right sided  rib series revealed no fracture.  She is modestly improved, but still having some significant right posterior chest wall pain that seems worse at night.  Pain is aggravated by twisting and moving.  No fever, chills, weight loss, anorexia, or other constitutional complaints.  she does have a history of treated osteoporosis  Allergies: 1)  ! Ms Contin (Morphine Sulfate) 2)  ! Ibuprofen  Past History:  Past Medical History: Reviewed history from 01/15/2010 and no changes required. Breast cancer, hx of  1994 Ovarian CA Coronary artery disease Hypertension Cardiomyopathy now improved Lymphedema Colonic polyps, hx of left bundle branch block COPD osteoporosis Hyperlipidemia Allergic rhinitis  Review of Systems       complain of the right posterior chest wall pain  Physical Exam  General:  Well-developed,well-nourished,in no acute distress; alert,appropriate and cooperative throughout examination Chest Wall:  patient had tenderness involving the right posterior chest wall area.  There is no tenderness over the spinous processes of the thoracic or lumbar spine Lungs:  Normal respiratory effort, chest expands symmetrically.  Lungs are clear to auscultation, no crackles or wheezes.   Impression & Recommendations:  Problem # 1:  BACK PAIN (ICD-724.5)  Her updated medication list for this problem includes:    Ultram 50 Mg Tabs (Tramadol hcl) .Marland Kitchen... 1 q6h as needed    Aspirin 81 Mg Tbec (Aspirin) .Marland Kitchen... Take one tablet by mouth daily    Hydrocodone-acetaminophen 5-325 Mg Tabs (Hydrocodone-acetaminophen) ..... Use as directed pain since a be improving; still suspect an  occult rib fracture;  will call if there is any worsening or if she fails to make steady progress  Her updated medication list for this problem includes:    Ultram 50 Mg Tabs (Tramadol hcl) .Marland Kitchen... 1 q6h as needed    Aspirin 81 Mg Tbec (Aspirin) .Marland Kitchen... Take one tablet by mouth daily    Hydrocodone-acetaminophen 5-325 Mg Tabs (Hydrocodone-acetaminophen) ..... Use as directed  Problem # 2:  OSTEOPOROSIS (ICD-733.00)  Her updated medication list for this problem includes:    Fosamax 70 Mg Tabs (Alendronate sodium) .Marland Kitchen... 1 q week  Her updated medication list for this problem includes:    Fosamax 70 Mg Tabs (Alendronate sodium) .Marland Kitchen... 1 q week  Complete Medication List: 1)  Fosamax 70 Mg Tabs (Alendronate sodium) .Marland Kitchen.. 1 q week 2)  Ultram 50 Mg Tabs (Tramadol hcl) .Marland Kitchen.. 1 q6h as needed 3)  Coreg 25 Mg Tabs (Carvedilol) .... One twice daily 4)  Zocor 40 Mg Tabs (Simvastatin) .Marland Kitchen.. 1 once daily 5)  Lisinopril-hydrochlorothiazide 20-12.5 Mg Tabs (Lisinopril-hydrochlorothiazide) .... Take 1 tablet by  mouth once a day 6)  Hydrocodone-homatropine 5-1.5 Mg/68ml Syrp (Hydrocodone-homatropine) .Marland Kitchen.. 1 teaspoon every 6 hours as needed for cough 7)  Flonase 50 Mcg/act Susp (Fluticasone propionate) .... As needed 8)  Aspirin 81 Mg Tbec (Aspirin) .... Take one tablet by mouth daily 9)  Stool Softener 100 Mg Caps (Docusate sodium) .... Take 1 capsule by mouth two times a day 10)  Calcium Carbonate-vitamin D 600-400 Mg-unit Tabs (Calcium carbonate-vitamin d) .... Take 1  tablet by mouth two times a day 11)  Fish Oil 1000 Mg Caps (Omega-3 fatty acids) .... Take 1 capsule by mouth two times a day 12)  Coq10 100 Mg Caps (Coenzyme q10) .... Take 1 capsule by mouth once a day 13)  Multivitamins Tabs (Multiple vitamin) .... Take 1 tablet by mouth once a day 14)  Diazepam 2 Mg Tabs (Diazepam) .... Take one every 8 to 12 hours for muscle pain 15)  Hydrocodone-acetaminophen 5-325 Mg Tabs (Hydrocodone-acetaminophen) .... Use as directed  Patient Instructions: 1)  call if pain worsens or you fail to improve

## 2010-12-18 NOTE — Progress Notes (Signed)
Summary: refill carvedilol  Phone Note Refill Request Message from:  Fax from Pharmacy on October 08, 2010 11:54 AM  Refills Requested: Medication #1:  COREG 25 MG  TABS one twice daily [facility]   Method Requested: Fax to Local Pharmacy Initial call taken by: Duard Brady LPN,  October 08, 2010 11:55 AM    Prescriptions: COREG 25 MG  TABS (CARVEDILOL) one twice daily  #180 x 2   Entered by:   Duard Brady LPN   Authorized by:   Gordy Savers  MD   Signed by:   Duard Brady LPN on 16/08/9603   Method used:   Faxed to ...       [facility] (retail)       [address]       [place], [place]  [address]       Ph: [phone]       Fax: [phone]   RxID:   [id number]

## 2010-12-18 NOTE — Progress Notes (Signed)
Summary: BP Follow-Up  Phone Note Call from Patient Call back at Home Phone [phone]   Caller: Patient Summary of Call: Patient called and LM on triage VM this morning stating that she saw Dr. Milinda Cave last week for her blood pressure being high. He told her to call back this morning to see how she was doing. Patient stated her BP was 157/87 this morning and that is the highest it has been. Please advise any new instructions.  Initial call taken by: Harold Barban,  September 29, 2010 10:15 AM  Follow-up for Phone Call        Okay, please tell her I want to add another BP med. I'll send rx for amlodipine to her pharmacy.  Please tell her to take this every day in addition to her other bp meds.  Keep scheduled f/u appt.  --PM Follow-up by: Michell Heinrich M.D.,  September 29, 2010 12:20 PM  Additional Follow-up for Phone Call Additional follow up Details #1::        Patient is aware to pick up rx and to keep f/u appt. Additional Follow-up by: Harold Barban,  September 29, 2010 12:40 PM    New/Updated Medications: NORVASC 10 MG TABS (AMLODIPINE BESYLATE) 1 tab by mouth qd Prescriptions: NORVASC 10 MG TABS (AMLODIPINE BESYLATE) 1 tab by mouth qd  #30 x 1   Entered and Authorized by:   Michell Heinrich M.D.   Signed by:   Michell Heinrich M.D. on 09/29/2010   Method used:   Electronically to        [facility] (retail)       [address]       [place], [place]  [address]       Ph: [phone]       Fax: [phone]   RxID:   [id number]

## 2010-12-18 NOTE — Assessment & Plan Note (Signed)
Summary: [facility]  Medications Added LISINOPRIL-HYDROCHLOROTHIAZIDE 20-12.5 MG TABS (LISINOPRIL-HYDROCHLOROTHIAZIDE) Take 1 tablet by mouth once a day FLONASE 50 MCG/ACT SUSP (FLUTICASONE PROPIONATE) as needed ASPIRIN 81 MG TBEC (ASPIRIN) Take one tablet by mouth daily STOOL SOFTENER 100 MG CAPS (DOCUSATE SODIUM) Take 1 capsule by mouth two times a day CALCIUM CARBONATE-VITAMIN D 600-400 MG-UNIT  TABS (CALCIUM CARBONATE-VITAMIN D) Take 1 tablet by mouth two times a day FISH OIL 1000 MG CAPS (OMEGA-3 FATTY ACIDS) Take 1 capsule by mouth two times a day COQ10 100 MG CAPS (COENZYME Q10) Take 1 capsule by mouth once a day MULTIVITAMINS  TABS (MULTIPLE VITAMIN) Take 1 tablet by mouth once a day        Visit Type:  1 year follow up  CC:  No cardiac complains.  History of Present Illness: Judy Herrera is a very pleasant  female with a history of cardiomyopathy out of proportion to coronary artery disease.  She did have a cardiac catheterization performed on August 18, 2005.  At that time, she had a 60% OM, but otherwise nonobstructive coronary artery disease.  Ejection fraction was 45%.  The last echocardiogram in January 2010 showed normal LV function, mild aortic insufficiency and mitral regurgitation. I last saw her in January of 2010. Since then she denies any dyspnea on exertion, orthopnea, PND, pedal edema, palpitations, syncope or chest pain. Note she did lose her husband in December of 2010. She had been married for 56 years.  Current Medications (verified): 1)  Fosamax 70 Mg  Tabs (Alendronate Sodium) .Marland Kitchen.. 1 Q Week 2)  Ultram 50 Mg  Tabs (Tramadol Hcl) .Marland Kitchen.. 1 Q6h As Needed 3)  Coreg 25 Mg  Tabs (Carvedilol) .... One Twice Daily 4)  Zocor 40 Mg  Tabs (Simvastatin) .Marland Kitchen.. 1 Once Daily 5)  Lisinopril-Hydrochlorothiazide 20-12.5 Mg Tabs (Lisinopril-Hydrochlorothiazide) .... Take 1 Tablet By Mouth Once A Day 6)  Hydrocodone-Homatropine 5-1.5 Mg/55ml Syrp (Hydrocodone-Homatropine) .Marland Kitchen..  1 Teaspoon Every 6 Hours As Needed For Cough 7)  Flonase 50 Mcg/act Susp (Fluticasone Propionate) .... As Needed 8)  Aspirin 81 Mg Tbec (Aspirin) .... Take One Tablet By Mouth Daily 9)  Stool Softener 100 Mg Caps (Docusate Sodium) .... Take 1 Capsule By Mouth Two Times A Day 10)  Calcium Carbonate-Vitamin D 600-400 Mg-Unit  Tabs (Calcium Carbonate-Vitamin D) .... Take 1 Tablet By Mouth Two Times A Day 11)  Fish Oil 1000 Mg Caps (Omega-3 Fatty Acids) .... Take 1 Capsule By Mouth Two Times A Day 12)  Coq10 100 Mg Caps (Coenzyme Q10) .... Take 1 Capsule By Mouth Once A Day 13)  Multivitamins  Tabs (Multiple Vitamin) .... Take 1 Tablet By Mouth Once A Day  Allergies: 1)  ! Ms Contin (Morphine Sulfate)  Past History:  Past Medical History: Breast cancer, hx of  1994 Ovarian CA Coronary artery disease Hypertension Cardiomyopathy now improved Lymphedema Colonic polyps, hx of left bundle branch block COPD osteoporosis Hyperlipidemia Allergic rhinitis  Past Surgical History: Reviewed history from 07/13/2007 and no changes required. Hysterectomy Mastectomy Cataract extraction Cholecystectomy L shoulder prosthesis R ankle ORIF  Social History: Reviewed history from 11/29/2008 and no changes required. Married Never Smoked Alcohol Use - no  Review of Systems       no fevers or chills, productive cough, hemoptysis, dysphasia, odynophagia, melena, hematochezia, dysuria, hematuria, rash, seizure activity, orthopnea, PND, pedal edema, claudication. Remaining systems are negative.   Vital Signs:  Patient profile:   77 year old female Height:  63 inches Weight:      108.75 pounds BMI:     19.33 Pulse rate:   74 / minute Pulse rhythm:   regular Resp:     18 per minute BP sitting:   124 / 59  (right arm) Cuff size:   regular  Vitals Entered By: Vikki Ports (January 15, 2010 4:10 PM)  Physical Exam  General:  Well-developed well-nourished in no acute distress.  Skin is  warm and dry.  HEENT is normal.  Neck is supple. No thyromegaly.  Chest is clear to auscultation with normal expansion.  Cardiovascular exam is regular rate and rhythm.  Abdominal exam nontender or distended. No masses palpated. Extremities show no edema. neuro grossly intact    EKG  Procedure date:  01/15/2010  Findings:      Sinus rhythm at a rate of 63. Left bundle branch block.  Impression & Recommendations:  Problem # 1:  CORONARY ARTERY DISEASE (ICD-414.00) Continue aspirin, beta blocker, ACE inhibitor and statin. The following medications were removed from the medication list:    Lisinopril 20 Mg Tabs (Lisinopril) .Marland Kitchen... 1 once daily    Aspir-low 81 Mg Tbec (Aspirin) .Marland Kitchen... 1 once daily Her updated medication list for this problem includes:    Coreg 25 Mg Tabs (Carvedilol) ..... One twice daily    Lisinopril-hydrochlorothiazide 20-12.5 Mg Tabs (Lisinopril-hydrochlorothiazide) .Marland Kitchen... Take 1 tablet by mouth once a day    Aspirin 81 Mg Tbec (Aspirin) .Marland Kitchen... Take one tablet by mouth daily  Problem # 2:  OTHER PRIMARY CARDIOMYOPATHIES (ICD-425.4) LV function now improved. Continue ACE inhibitor and beta blocker. The following medications were removed from the medication list:    Lisinopril 20 Mg Tabs (Lisinopril) .Marland Kitchen... 1 once daily    Aspir-low 81 Mg Tbec (Aspirin) .Marland Kitchen... 1 once daily Her updated medication list for this problem includes:    Coreg 25 Mg Tabs (Carvedilol) ..... One twice daily    Lisinopril-hydrochlorothiazide 20-12.5 Mg Tabs (Lisinopril-hydrochlorothiazide) .Marland Kitchen... Take 1 tablet by mouth once a day    Aspirin 81 Mg Tbec (Aspirin) .Marland Kitchen... Take one tablet by mouth daily  Problem # 3:  HYPERLIPIDEMIA (ICD-272.4) Continue statin. Check lipids and liver. Her updated medication list for this problem includes:    Zocor 40 Mg Tabs (Simvastatin) .Marland Kitchen... 1 once daily  Problem # 4:  HYPERTENSION (ICD-401.9) Blood pressure controlled on present medications. Will  continue. The following medications were removed from the medication list:    Lisinopril 20 Mg Tabs (Lisinopril) .Marland Kitchen... 1 once daily    Aspir-low 81 Mg Tbec (Aspirin) .Marland Kitchen... 1 once daily Her updated medication list for this problem includes:    Coreg 25 Mg Tabs (Carvedilol) ..... One twice daily    Lisinopril-hydrochlorothiazide 20-12.5 Mg Tabs (Lisinopril-hydrochlorothiazide) .Marland Kitchen... Take 1 tablet by mouth once a day    Aspirin 81 Mg Tbec (Aspirin) .Marland Kitchen... Take one tablet by mouth daily  Problem # 5:  BREAST CANCER, HX OF (ICD-V10.3)  Patient Instructions: 1)  Your physician recommends that you schedule a follow-up appointment in: ONE YEAR 2)  Your physician recommends that you return for a FASTING lipid profile: WHEN ABLE

## 2010-12-18 NOTE — Assessment & Plan Note (Signed)
Summary: bilateral swelling/dm   Vital Signs:  Patient profile:   77 year old female Weight:      110 pounds Temp:     98.2 degrees F oral BP sitting:   100 / 58  (right arm) Cuff size:   regular  Vitals Entered By: Duard Brady LPN (October 08, 2010 11:05 AM) CC: c/o ankle swelling since started new BP meds Is Patient Diabetic? No   Primary Care Cloyce Paterson:  Gordy Savers  MD  CC:  c/o ankle swelling since started new BP meds.  History of Present Illness: 77 year old patient who is seen today for follow-up of her hypertension.  She was seen earlier  in  the month, and hydrochlorothiazide was discontinued due to orthostatic dizziness.  She is also noted to have mild hyponatremia and hypokalemia.  Her blood pressure became elevated and initially lisinopril was increased to 20 mg b.i.d..  Due to persistent high readings, amlodipine 10 mg was added to her regimen.  Since that time.  Her blood pressure readings have been running low, and she is developed some pedal edema  Allergies: 1)  ! Ms Contin (Morphine Sulfate) 2)  ! Ibuprofen  Review of Systems       The patient complains of peripheral edema.  The patient denies anorexia, fever, weight loss, weight gain, vision loss, decreased hearing, hoarseness, chest pain, syncope, dyspnea on exertion, prolonged cough, headaches, hemoptysis, abdominal pain, melena, hematochezia, severe indigestion/heartburn, hematuria, incontinence, genital sores, muscle weakness, suspicious skin lesions, transient blindness, difficulty walking, depression, unusual weight change, abnormal bleeding, enlarged lymph nodes, angioedema, and breast masses.    Physical Exam  General:  Well-developed,well-nourished,in no acute distress; alert,appropriate and cooperative throughout examination; 94/64 Head:  Normocephalic and atraumatic without obvious abnormalities. No apparent alopecia or balding. Mouth:  Oral mucosa and oropharynx without lesions or  exudates.  Teeth in good repair. Neck:  No deformities, masses, or tenderness noted. Lungs:  Normal respiratory effort, chest expands symmetrically. Lungs are clear to auscultation, no crackles or wheezes. Heart:  Normal rate and regular rhythm. S1 and S2 normal without gallop, murmur, click, rub or other extra sounds. Extremities:  1+ left pedal edema and 1+ right pedal edema.  1+ left pedal edema and 1+ right pedal edema.     Impression & Recommendations:  Problem # 1:  HYPERTENSION (ICD-401.9)  The following medications were removed from the medication list:    Lisinopril 40 Mg Tabs (Lisinopril) .Marland Kitchen... 1 tab by mouth qd    Norvasc 10 Mg Tabs (Amlodipine besylate) .Marland Kitchen... 1 tab by mouth qd Her updated medication list for this problem includes:    Coreg 25 Mg Tabs (Carvedilol) ..... One twice daily    Lisinopril 20 Mg Tabs (Lisinopril) ..... One twice daily    Amlodipine Besylate 2.5 Mg Tabs (Amlodipine besylate) ..... One every morning  The following medications were removed from the medication list:    Lisinopril 40 Mg Tabs (Lisinopril) .Marland Kitchen... 1 tab by mouth qd    Norvasc 10 Mg Tabs (Amlodipine besylate) .Marland Kitchen... 1 tab by mouth qd Her updated medication list for this problem includes:    Coreg 25 Mg Tabs (Carvedilol) ..... One twice daily    Lisinopril 20 Mg Tabs (Lisinopril) ..... One twice daily    Amlodipine Besylate 2.5 Mg Tabs (Amlodipine besylate) ..... One every morning  Problem # 2:  SYNCOPE (ICD-780.2)  Complete Medication List: 1)  Ultram 50 Mg Tabs (Tramadol hcl) .Marland Kitchen.. 1 q6h as needed 2)  Coreg 25  Mg Tabs (Carvedilol) .... One twice daily 3)  Zocor 40 Mg Tabs (Simvastatin) .Marland Kitchen.. 1 once daily 4)  Flonase 50 Mcg/act Susp (Fluticasone propionate) .... As needed 5)  Aspirin 81 Mg Tbec (Aspirin) .... Take one tablet by mouth daily 6)  Stool Softener 100 Mg Caps (Docusate sodium) .... Take 1 capsule by mouth two times a day 7)  Calcium Carbonate-vitamin D 600-400 Mg-unit Tabs  (Calcium carbonate-vitamin d) .... Take 1 tablet by mouth two times a day 8)  Fish Oil 1000 Mg Caps (Omega-3 fatty acids) .... Take 1 capsule by mouth two times a day 9)  Coq10 100 Mg Caps (Coenzyme q10) .... Take 1 capsule by mouth once a day 10)  Multivitamins Tabs (Multiple vitamin) .... Take 1 tablet by mouth once a day 11)  Diazepam 2 Mg Tabs (Diazepam) .... Take one every 8 to 12 hours for muscle pain 12)  Hydrocodone-acetaminophen 5-325 Mg Tabs (Hydrocodone-acetaminophen) .... Use as directed 13)  Lisinopril 20 Mg Tabs (Lisinopril) .... One twice daily 14)  Amlodipine Besylate 2.5 Mg Tabs (Amlodipine besylate) .... One every morning  Patient Instructions: 1)  Limit your Sodium (Salt). 2)  It is important that you exercise regularly at least 20 minutes 5 times a week. If you develop chest pain, have severe difficulty breathing, or feel very tired , stop exercising immediately and seek medical attention. 3)  discontinue amlodipine 10 mg daily 4)  continue to monitor blood pressure readings and start amlodipine 2.5 mg daily if systolic blood pressure is greater than 160 5)  Please schedule a follow-up appointment in 1 month. Prescriptions: AMLODIPINE BESYLATE 2.5 MG TABS (AMLODIPINE BESYLATE) one every morning  #50 x 0   Entered and Authorized by:   Gordy Savers  MD   Signed by:   Gordy Savers  MD on 10/08/2010   Method used:   Print then Give to Patient   RxID:   [id number]    Orders Added: 1)  Est. Patient Level III [56213]

## 2010-12-18 NOTE — Assessment & Plan Note (Signed)
Summary: hand pain/ok per doc/njr   Vital Signs:  Patient profile:   77 year old female Weight:      109 pounds Temp:     97.9 degrees F oral BP sitting:   124 / 70  (right arm) Cuff size:   regular  Vitals Entered By: Duard Brady LPN (November 12, 2010 11:14 AM)  Procedure Note  Injections: The patient complains of pain, inflammation, tenderness, and swelling. Date of onset: 10/08/2010 Duration of symptoms: 4 weeks Consent signed: no  Procedure # 1: tendon sheath injection    Region: lateral    Location: right wrist    Technique: 24 g needle    Medication: 40 mg depomedrol    Anesthesia: 1.0 ml 1% lidocaine w/o epinephrine  CC: (R) hand pain, swelling Is Patient Diabetic? No   Primary Care Provider:  Gordy Savers  MD  CC:  (R) hand pain and swelling.  History of Present Illness: 77 year old patient who has ongoing pain involving her right wrist now of 3 to 4 weeks duration.  This has not responded well to oral anti-inflammatory medications.  Is also been using analgesics with little benefit.  She has treated hypertension, which has been stable, although she occasionally runs high readings in the morning.  Allergies: 1)  ! Ms Contin (Morphine Sulfate) 2)  ! Ibuprofen  Past History:  Past Medical History: Reviewed history from 09/23/2010 and no changes required. Breast cancer, hx of  1994 (T3 N1 Mx) Ovarian CA, 2004-Clear cel-l Stage IA Coronary artery disease Hypertension Cardiomyopathy now improved Lymphedema Colonic polyps, hx of left bundle branch block COPD osteoporosis Hyperlipidemia Allergic rhinitis orthostatic syncope, November 2010  Physical Exam  General:  Well-developed,well-nourished,in no acute distress; alert,appropriate and cooperative throughout examination; 140/80 Msk:  examination of  the right wrist reveals some soft tissue swelling along the radial aspect of the wrist; this ear is slightly tender to palpation, flexion  and extension of the wrist aggravated the pain   Impression & Recommendations:  Problem # 1:  TENDINITIS, RIGHT WRIST (ICD-727.05)  Orders: Injection, Tendon / Ligament (57846) Depo- Medrol 40mg  (J1030)  Problem # 2:  HYPERTENSION (ICD-401.9)  Her updated medication list for this problem includes:    Coreg 25 Mg Tabs (Carvedilol) ..... One twice daily    Lisinopril 20 Mg Tabs (Lisinopril) ..... One twice daily    Amlodipine Besylate 2.5 Mg Tabs (Amlodipine besylate) ..... One every morning  Complete Medication List: 1)  Ultram 50 Mg Tabs (Tramadol hcl) .Marland Kitchen.. 1 q6h as needed 2)  Coreg 25 Mg Tabs (Carvedilol) .... One twice daily 3)  Zocor 40 Mg Tabs (Simvastatin) .Marland Kitchen.. 1 once daily 4)  Flonase 50 Mcg/act Susp (Fluticasone propionate) .... As needed 5)  Aspirin 81 Mg Tbec (Aspirin) .... Take one tablet by mouth daily 6)  Stool Softener 100 Mg Caps (Docusate sodium) .... Take 1 capsule by mouth two times a day 7)  Calcium Carbonate-vitamin D 600-400 Mg-unit Tabs (Calcium carbonate-vitamin d) .... Take 1 tablet by mouth two times a day 8)  Fish Oil 1000 Mg Caps (Omega-3 fatty acids) .... Take 1 capsule by mouth two times a day 9)  Coq10 100 Mg Caps (Coenzyme q10) .... Take 1 capsule by mouth once a day 10)  Multivitamins Tabs (Multiple vitamin) .... Take 1 tablet by mouth once a day 11)  Diazepam 2 Mg Tabs (Diazepam) .... Take one every 8 to 12 hours for muscle pain 12)  Hydrocodone-acetaminophen 5-325 Mg Tabs (  Hydrocodone-acetaminophen) .... Use as directed 13)  Lisinopril 20 Mg Tabs (Lisinopril) .... One twice daily 14)  Amlodipine Besylate 2.5 Mg Tabs (Amlodipine besylate) .... One every morning  Patient Instructions: 1)  Please schedule a follow-up appointment in 3 months. 2)  Limit your Sodium (Salt) to less than 2 grams a day(slightly less than 1/2 a teaspoon) to prevent fluid retention, swelling, or worsening of symptoms. 3)  Take 400-600mg  of Ibuprofen (Advil, Motrin) with food  every 4-6 hours as needed for relief of pain or comfort of fever. 4)  Please schedule a follow-up appointment as needed.   Orders Added: 1)  Injection, Tendon / Ligament [20550] 2)  Depo- Medrol 40mg  [J1030]

## 2010-12-18 NOTE — Assessment & Plan Note (Signed)
Summary: fup/cjr   Vital Signs:  Patient profile:   77 year old female Weight:      108 pounds O2 Sat:      98 % on Room air Temp:     97.8 degrees F oral Pulse rate:   79 / minute BP sitting:   100 / 60  (right arm) Cuff size:   regular  Vitals Entered By: Romualdo Bolk, CMA (AAMA) (October 03, 2010 9:03 AM)  O2 Flow:  Room air CC: Follow-up visit on BP   History of Present Illness: 77 y/o WF here for recheck of BP after having her lisinopril titrated to 40mg  once daily and amlodipine 10mg  once daily added.  She reports feeling great.  The discomfort in the back of her head is gone.  No dizziness/lightheaded feeling upon standing.  Feels no side effects from meds.  Preventive Screening-Counseling & Management  Alcohol-Tobacco     Smoking Status: never  Current Problems (verified): 1)  Hypertension  (ICD-401.9) 2)  Syncope  (ICD-780.2) 3)  Syncope  (ICD-780.2) 4)  Benign Neoplasm Skin Other&unspec Parts Face  (ICD-216.3) 5)  Back Pain  (ICD-724.5) 6)  Hyperlipidemia  (ICD-272.4) 7)  Other Primary Cardiomyopathies  (ICD-425.4) 8)  Allergic Rhinitis  (ICD-477.9) 9)  Osteoporosis  (ICD-733.00) 10)  Uti  (ICD-599.0) 11)  COPD  (ICD-496) 12)  Upper Respiratory Infection, Viral  (ICD-465.9) 13)  Colonic Polyps, Hx of  (ICD-V12.72) 14)  Hypertension  (ICD-401.9) 15)  Coronary Artery Disease  (ICD-414.00) 16)  Breast Cancer, Hx of  (ICD-V10.3)  Current Medications (verified): 1)  Ultram 50 Mg  Tabs (Tramadol Hcl) .Marland Kitchen.. 1 Q6h As Needed 2)  Coreg 25 Mg  Tabs (Carvedilol) .... One Twice Daily 3)  Zocor 40 Mg  Tabs (Simvastatin) .Marland Kitchen.. 1 Once Daily 4)  Flonase 50 Mcg/act Susp (Fluticasone Propionate) .... As Needed 5)  Aspirin 81 Mg Tbec (Aspirin) .... Take One Tablet By Mouth Daily 6)  Stool Softener 100 Mg Caps (Docusate Sodium) .... Take 1 Capsule By Mouth Two Times A Day 7)  Calcium Carbonate-Vitamin D 600-400 Mg-Unit  Tabs (Calcium Carbonate-Vitamin D) .... Take 1  Tablet By Mouth Two Times A Day 8)  Fish Oil 1000 Mg Caps (Omega-3 Fatty Acids) .... Take 1 Capsule By Mouth Two Times A Day 9)  Coq10 100 Mg Caps (Coenzyme Q10) .... Take 1 Capsule By Mouth Once A Day 10)  Multivitamins  Tabs (Multiple Vitamin) .... Take 1 Tablet By Mouth Once A Day 11)  Diazepam 2 Mg Tabs (Diazepam) .... Take One Every 8 To 12 Hours For Muscle Pain 12)  Hydrocodone-Acetaminophen 5-325 Mg Tabs (Hydrocodone-Acetaminophen) .... Use As Directed 13)  Klor-Con M20 20 Meq Cr-Tabs (Potassium Chloride Crys Cr) .Marland Kitchen.. 1 By Mouth Bid 14)  Lisinopril 40 Mg Tabs (Lisinopril) .Marland Kitchen.. 1 Tab By Mouth Qd 15)  Norvasc 10 Mg Tabs (Amlodipine Besylate) .Marland Kitchen.. 1 Tab By Mouth Qd  Allergies (verified): 1)  ! Ms Contin (Morphine Sulfate) 2)  ! Ibuprofen  Past History:  Past medical history reviewed for relevance to current acute and chronic problems.  Past Medical History: Reviewed history from 09/23/2010 and no changes required. Breast cancer, hx of  1994 (T3 N1 Mx) Ovarian CA, 2004-Clear cel-l Stage IA Coronary artery disease Hypertension Cardiomyopathy now improved Lymphedema Colonic polyps, hx of left bundle branch block COPD osteoporosis Hyperlipidemia Allergic rhinitis orthostatic syncope, November 2010  Physical Exam  General:  VS: noted, all normal. Gen: Alert, well appearing, oriented x 4.  Chest: symmetric expansion, with nonlabored respirations.  Clear and equal breath sounds in all lung fields.   CV: RRR, no m/r/g.  Peripheral pulses 2+/symmetric.    Impression & Recommendations:  Problem # 1:  HYPERTENSION (ICD-401.9) Control is good now and she feels well.   Of note, since her hctz has been stopped (and she has lots of trouble swallowing the large potassium pills) and her ACE-I has been increased, we'll have her stop her potassium pills and we'll check a BMET in 1 week.   Her updated medication list for this problem includes:    Coreg 25 Mg Tabs (Carvedilol) .....  One twice daily    Lisinopril 40 Mg Tabs (Lisinopril) .Marland Kitchen... 1 tab by mouth qd    Norvasc 10 Mg Tabs (Amlodipine besylate) .Marland Kitchen... 1 tab by mouth qd  Complete Medication List: 1)  Ultram 50 Mg Tabs (Tramadol hcl) .Marland Kitchen.. 1 q6h as needed 2)  Coreg 25 Mg Tabs (Carvedilol) .... One twice daily 3)  Zocor 40 Mg Tabs (Simvastatin) .Marland Kitchen.. 1 once daily 4)  Flonase 50 Mcg/act Susp (Fluticasone propionate) .... As needed 5)  Aspirin 81 Mg Tbec (Aspirin) .... Take one tablet by mouth daily 6)  Stool Softener 100 Mg Caps (Docusate sodium) .... Take 1 capsule by mouth two times a day 7)  Calcium Carbonate-vitamin D 600-400 Mg-unit Tabs (Calcium carbonate-vitamin d) .... Take 1 tablet by mouth two times a day 8)  Fish Oil 1000 Mg Caps (Omega-3 fatty acids) .... Take 1 capsule by mouth two times a day 9)  Coq10 100 Mg Caps (Coenzyme q10) .... Take 1 capsule by mouth once a day 10)  Multivitamins Tabs (Multiple vitamin) .... Take 1 tablet by mouth once a day 11)  Diazepam 2 Mg Tabs (Diazepam) .... Take one every 8 to 12 hours for muscle pain 12)  Hydrocodone-acetaminophen 5-325 Mg Tabs (Hydrocodone-acetaminophen) .... Use as directed 13)  Klor-con M20 20 Meq Cr-tabs (Potassium chloride crys cr) .Marland Kitchen.. 1 by mouth bid 14)  Lisinopril 40 Mg Tabs (Lisinopril) .Marland Kitchen.. 1 tab by mouth qd 15)  Norvasc 10 Mg Tabs (Amlodipine besylate) .Marland Kitchen.. 1 tab by mouth qd  Patient Instructions: 1)  Continue all current bp meds, and stop your potassium. 2)  Follow up for blood draw to recheck BMET in 1 wk--Dx 401.1. 3)  Routine follow up with your primary MD in 4-6 months.   Orders Added: 1)  Est. Patient Level III [72536]

## 2010-12-18 NOTE — Letter (Signed)
Summary: [facility]  [facility]   Imported By: Maryln Gottron 12/02/2009 09:03:37  _____________________________________________________________________  External Attachment:    Type:   Image     Comment:   External Document

## 2010-12-18 NOTE — Letter (Signed)
Summary: [facility]   Imported By: Maryln Gottron 07/07/2010 13:08:30  _____________________________________________________________________  External Attachment:    Type:   Image     Comment:   External Document

## 2010-12-18 NOTE — Assessment & Plan Note (Signed)
Summary: CPX (PT WILL COME IN FASTING) // RS   Vital Signs:  Patient profile:   77 year old female Height:      63.5 inches Weight:      106 pounds BMI:     18.55 O2 Sat:      97 % on Room air Temp:     97.8 degrees F oral Pulse rate:   80 / minute Pulse rhythm:   regular BP sitting:   130 / 70  (right arm) Cuff size:   regular  Vitals Entered By: Duard Brady LPN (December 05, 2010 9:17 AM)  O2 Flow:  Room air CC: cpx - doing well Is Patient Diabetic? No   Primary Care Provider:  Gordy Savers  MD  CC:  cpx - doing well.  History of Present Illness: 77 year old patient who is seen today for a wellness exam.  She is followed closely by oncology gynecology due to a history of ovarian cancer.  She also has remote history of left breast cancer.  She has treated hypertension, coronary artery disease, and dyslipidemia.  She is also followed by cardiology and remains quite stable.  She also has a history of colonic polyps, but has not had a colonoscopy.  Next [place] of 7 years.  Here for Medicare AWV:  1.   Risk factors based on Past M, S, F history:  patient has known coronary artery disease.  Cardiovascular as factors include hypertension, dyslipidemia 2.   Physical Activities: no activity restrictions 3.   Depression/mood: no history of depression or mood disorder 4.   Hearing: no hearing deficits 5.   ADL's: remains independent in all aspects of daily living 6.   Fall Risk: low 7.   Home Safety: no problems identified 8.   Height, weight, &visual acuity:height and weight stable.  No change in her visual acuity 9.   Counseling: heart healthy diet and regular.  Exercise encouraged 10.   Labs ordered based on risk factors: laboratory profile, including lipid panel will be reviewed 11.           Referral Coordination- follow-up  gynecology and oncology 12.           Care Plan- follow-up colonoscopy 13.            Cognitive Assessment- alert and oriented, with normal  affect.  No cognitive dysfunction   Allergies: 1)  ! Ms Contin (Morphine Sulfate) 2)  ! Ibuprofen  Past History:  Past Medical History: Reviewed history from 09/23/2010 and no changes required. Breast cancer, hx of  1994 (T3 N1 Mx) Ovarian CA, 2004-Clear cel-l Stage IA Coronary artery disease Hypertension Cardiomyopathy now improved Lymphedema Colonic polyps, hx of left bundle branch block COPD osteoporosis Hyperlipidemia Allergic rhinitis orthostatic syncope, November 2010  Past Surgical History: Reviewed history from 07/13/2007 and no changes required. Hysterectomy Mastectomy Cataract extraction Cholecystectomy L shoulder prosthesis R ankle ORIF  Family History: Reviewed history from 09/08/2007 and no changes required. the father died age 19 MI mother died age 13 MI one brother casualty of the [place] war 4 sisters, history of breast cancer  Social History: Reviewed history from 11/29/2008 and no changes required. Married Never Smoked Alcohol Use - no  Review of Systems  The patient denies anorexia, fever, weight loss, weight gain, vision loss, decreased hearing, hoarseness, chest pain, syncope, dyspnea on exertion, peripheral edema, prolonged cough, headaches, hemoptysis, abdominal pain, melena, hematochezia, severe indigestion/heartburn, hematuria, incontinence, genital sores, muscle weakness, suspicious skin lesions, transient blindness,  difficulty walking, depression, unusual weight change, abnormal bleeding, enlarged lymph nodes, angioedema, and breast masses.    Physical Exam  General:  Well-developed,well-nourished,in no acute distress; alert,appropriate and cooperative throughout examination Head:  Normocephalic and atraumatic without obvious abnormalities. No apparent alopecia or balding. Eyes:  No corneal or conjunctival inflammation noted. EOMI. Perrla. Funduscopic exam benign, without hemorrhages, exudates or papilledema. Vision grossly  normal. Ears:  External ear exam shows no significant lesions or deformities.  Otoscopic examination reveals clear canals, tympanic membranes are intact bilaterally without bulging, retraction, inflammation or discharge. Hearing is grossly normal bilaterally. Nose:  External nasal examination shows no deformity or inflammation. Nasal mucosa are pink and moist without lesions or exudates. Mouth:  Oral mucosa and oropharynx without lesions or exudates.  Teeth in good repair. Neck:  No deformities, masses, or tenderness noted. left supraclavicular bruit Chest Wall:  No deformities, masses, or tenderness noted. Breasts:  No mass, nodules, thickening, tenderness, bulging, retraction, inflamation, nipple discharge or skin changes noted.   left radical mastectomy Lungs:  Normal respiratory effort, chest expands symmetrically. Lungs are clear to auscultation, no crackles or wheezes. Heart:  Normal rate and regular rhythm. S1 and S2 normal without gallop, murmur, click, rub or other extra sounds. Abdomen:  Bowel sounds positive,abdomen soft and non-tender without masses, organomegaly or hernias noted. Msk:  No deformity or scoliosis noted of thoracic or lumbar spine.   left shoulder scar Pulses:  R and L carotid,radial,femoral,dorsalis pedis and posterior tibial pulses are full and equal bilaterally Extremities:  No clubbing, cyanosis, edema, or deformity noted with normal full range of motion of all joints.   Neurologic:  No cranial nerve deficits noted. Station and gait are normal. Plantar reflexes are down-going bilaterally. DTRs are symmetrical throughout. Sensory, motor and coordinative functions appear intact. Skin:  Intact without suspicious lesions or rashes Cervical Nodes:  No lymphadenopathy noted Axillary Nodes:  No palpable lymphadenopathy Inguinal Nodes:  No significant adenopathy Psych:  Cognition and judgment appear intact. Alert and cooperative with normal attention span and concentration.  No apparent delusions, illusions, hallucinations   Impression & Recommendations:  Problem # 1:  Preventive Health Care (ICD-V70.0)  Orders: Medicare -1st Annual Wellness Visit [phone])  Complete Medication List: 1)  Ultram 50 Mg Tabs (Tramadol hcl) .Marland Kitchen.. 1 q6h as needed 2)  Coreg 25 Mg Tabs (Carvedilol) .... One twice daily 3)  Zocor 40 Mg Tabs (Simvastatin) .Marland Kitchen.. 1 once daily 4)  Flonase 50 Mcg/act Susp (Fluticasone propionate) .... As needed 5)  Aspirin 81 Mg Tbec (Aspirin) .... Take one tablet by mouth daily 6)  Stool Softener 100 Mg Caps (Docusate sodium) .... Take 1 capsule by mouth two times a day 7)  Calcium Carbonate-vitamin D 600-400 Mg-unit Tabs (Calcium carbonate-vitamin d) .... Take 1 tablet by mouth two times a day 8)  Fish Oil 1000 Mg Caps (Omega-3 fatty acids) .... Take 1 capsule by mouth two times a day 9)  Coq10 100 Mg Caps (Coenzyme q10) .... Take 1 capsule by mouth once a day 10)  Multivitamins Tabs (Multiple vitamin) .... Take 1 tablet by mouth once a day 11)  Lisinopril 20 Mg Tabs (Lisinopril) .... One twice daily 12)  Amlodipine Besylate 2.5 Mg Tabs (Amlodipine besylate) .... One every morning  Other Orders: Gastroenterology Referral (GI) Venipuncture (98119) TLB-Lipid Panel (80061-LIPID) TLB-BMP (Basic Metabolic Panel-BMET) (80048-METABOL) TLB-CBC Platelet - w/Differential (85025-CBCD) TLB-TSH (Thyroid Stimulating Hormone) (84443-TSH) TLB-Hepatic/Liver Function Pnl (80076-HEPATIC)  Patient Instructions: 1)  Please schedule a follow-up appointment in 6  months. 2)  Limit your Sodium (Salt). 3)  It is important that you exercise regularly at least 20 minutes 5 times a week. If you develop chest pain, have severe difficulty breathing, or feel very tired , stop exercising immediately and seek medical attention. Prescriptions: AMLODIPINE BESYLATE 2.5 MG TABS (AMLODIPINE BESYLATE) one every morning  #90 x 6   Entered and Authorized by:   Gordy Savers  MD    Signed by:   Gordy Savers  MD on 12/05/2010   Method used:   Electronically to        [facility] (retail)       [address]       [place], [place]  [address]       Ph: [phone]       Fax: [phone]   RxID:   [id number] LISINOPRIL 20 MG TABS (LISINOPRIL) one twice daily  #180 x 6   Entered and Authorized by:   Gordy Savers  MD   Signed by:   Gordy Savers  MD on 12/05/2010   Method used:   Electronically to        [facility]* (retail)       [address]       [place], [place]  [address]       Ph: [phone]       Fax: [phone]   RxID:   [id number] FLONASE 50 MCG/ACT SUSP (FLUTICASONE PROPIONATE) as needed  #1 x 6   Entered and Authorized by:   Gordy Savers  MD   Signed by:   Gordy Savers  MD on 12/05/2010   Method used:   Electronically to        [facility]* (retail)       [address]       [place], [place]  [address]       Ph: [phone]       Fax: [phone]   RxID:   [id number] ZOCOR 40 MG  TABS (SIMVASTATIN) 1 once daily  #90 x 6   Entered and Authorized by:   Gordy Savers  MD   Signed by:   Gordy Savers  MD on 12/05/2010   Method used:   Electronically to        [facility]* (retail)       [address]       [place], [place]  [address]       Ph: [phone]       Fax: [phone]   RxID:   [id number] COREG 25 MG  TABS (CARVEDILOL) one twice daily  #180 x 2   Entered and Authorized by:   Gordy Savers  MD   Signed by:   Gordy Savers  MD on 12/05/2010   Method used:   Electronically to        [facility]* (retail)       [address]       [place], [place]  [address]       Ph: [phone]       Fax: [phone]   RxID:   [id number] ULTRAM 50 MG  TABS (TRAMADOL HCL) 1 q6h as needed  #90 x 6   Entered and Authorized by:   Gordy Savers  MD   Signed by:   Gordy Savers  MD on 12/05/2010   Method used:   Electronically to        [facility]* (retail)       [address]  [place], [place]  [address]       Ph: [phone]       Fax: [phone]   RxID:   [id number]    Orders Added: 1)  Medicare -1st Annual Wellness Visit [G0438] 2)  Est. Patient Level III [[address]] 3)  Gastroenterology Referral [GI] 4)  Venipuncture [36415] 5)  TLB-Lipid Panel [80061-LIPID] 6)  TLB-BMP (Basic Metabolic Panel-BMET) [80048-METABOL] 7)  TLB-CBC Platelet - w/Differential [85025-CBCD] 8)  TLB-TSH (Thyroid Stimulating Hormone) [84443-TSH] 9)  TLB-Hepatic/Liver Function Pnl [80076-HEPATIC]  Appended Document: Orders Update    Clinical Lists Changes  Orders: Added new Service order of Specimen Handling (44010) - Signed

## 2010-12-18 NOTE — Assessment & Plan Note (Signed)
Summary: BACK PAIN // RS   Vital Signs:  Patient profile:   77 year old female Weight:      107 pounds Temp:     97.9 degrees F oral BP sitting:   130 / 90  (left arm) Cuff size:   regular  Vitals Entered By: Kathrynn Speed CMA (July 07, 2010 3:58 PM) CC: back pain returned on sunday, src Is Patient Diabetic? No   CC:  back pain returned on sunday and src.  History of Present Illness: a 77 year old patient who is seen today for follow-up of right-sided back pain.  She was evaluated at the urgent care and x-rays of her spine and also  CT abdominal scan were negative.  Pain is aggravated by movement.  Denies any fever or other constitutional complaints.  She does have remote history of both ovarian and breast cancer  Current Medications (verified): 1)  Fosamax 70 Mg  Tabs (Alendronate Sodium) .Marland Kitchen.. 1 Q Week 2)  Ultram 50 Mg  Tabs (Tramadol Hcl) .Marland Kitchen.. 1 Q6h As Needed 3)  Coreg 25 Mg  Tabs (Carvedilol) .... One Twice Daily 4)  Zocor 40 Mg  Tabs (Simvastatin) .Marland Kitchen.. 1 Once Daily 5)  Lisinopril-Hydrochlorothiazide 20-12.5 Mg Tabs (Lisinopril-Hydrochlorothiazide) .... Take 1 Tablet By Mouth Once A Day 6)  Hydrocodone-Homatropine 5-1.5 Mg/36ml Syrp (Hydrocodone-Homatropine) .Marland Kitchen.. 1 Teaspoon Every 6 Hours As Needed For Cough 7)  Flonase 50 Mcg/act Susp (Fluticasone Propionate) .... As Needed 8)  Aspirin 81 Mg Tbec (Aspirin) .... Take One Tablet By Mouth Daily 9)  Stool Softener 100 Mg Caps (Docusate Sodium) .... Take 1 Capsule By Mouth Two Times A Day 10)  Calcium Carbonate-Vitamin D 600-400 Mg-Unit  Tabs (Calcium Carbonate-Vitamin D) .... Take 1 Tablet By Mouth Two Times A Day 11)  Fish Oil 1000 Mg Caps (Omega-3 Fatty Acids) .... Take 1 Capsule By Mouth Two Times A Day 12)  Coq10 100 Mg Caps (Coenzyme Q10) .... Take 1 Capsule By Mouth Once A Day 13)  Multivitamins  Tabs (Multiple Vitamin) .... Take 1 Tablet By Mouth Once A Day 14)  Diazepam 2 Mg Tabs (Diazepam) .... Take One Every 8 To 12  Hours For Muscle Pain 15)  Hydrocodone-Acetaminophen 5-325 Mg Tabs (Hydrocodone-Acetaminophen) .... Use As Directed  Allergies (verified): 1)  ! Ms Contin (Morphine Sulfate) 2)  ! Ibuprofen  Past History:  Past Medical History: Reviewed history from 01/15/2010 and no changes required. Breast cancer, hx of  1994 Ovarian CA Coronary artery disease Hypertension Cardiomyopathy now improved Lymphedema Colonic polyps, hx of left bundle branch block COPD osteoporosis Hyperlipidemia Allergic rhinitis  Review of Systems       The patient complains of chest pain.  The patient denies anorexia, fever, weight loss, weight gain, vision loss, decreased hearing, hoarseness, syncope, dyspnea on exertion, peripheral edema, prolonged cough, headaches, hemoptysis, abdominal pain, melena, hematochezia, severe indigestion/heartburn, hematuria, incontinence, genital sores, muscle weakness, suspicious skin lesions, transient blindness, difficulty walking, depression, unusual weight change, abnormal bleeding, enlarged lymph nodes, angioedema, and breast masses.    Physical Exam  Head:  Normocephalic and atraumatic without obvious abnormalities. No apparent alopecia or balding. Eyes:  No corneal or conjunctival inflammation noted. EOMI. Perrla. Funduscopic exam benign, without hemorrhages, exudates or papilledema. Vision grossly normal. Mouth:  Oral mucosa and oropharynx without lesions or exudates.  Teeth in good repair. Neck:  No deformities, masses, or tenderness noted. Chest Wall:  considerable tenderness involving the right lower lateral and posterior chest wall Lungs:  Normal respiratory effort, chest  expands symmetrically. Lungs are clear to auscultation, no crackles or wheezes. Heart:  Normal rate and regular rhythm. S1 and S2 normal without gallop, murmur, click, rub or other extra sounds. Abdomen:  Bowel sounds positive,abdomen soft and non-tender without masses, organomegaly or hernias  noted.   Impression & Recommendations:  Problem # 1:  BACK PAIN (ICD-724.5)  Her updated medication list for this problem includes:    Ultram 50 Mg Tabs (Tramadol hcl) .Marland Kitchen... 1 q6h as needed    Aspirin 81 Mg Tbec (Aspirin) .Marland Kitchen... Take one tablet by mouth daily    Hydrocodone-acetaminophen 5-325 Mg Tabs (Hydrocodone-acetaminophen) ..... Use as directed suspect right-sided rib fracture as a cause of her pain    Her updated medication list for this problem includes:    Ultram 50 Mg Tabs (Tramadol hcl) .Marland Kitchen... 1 q6h as needed    Aspirin 81 Mg Tbec (Aspirin) .Marland Kitchen... Take one tablet by mouth daily    Hydrocodone-acetaminophen 5-325 Mg Tabs (Hydrocodone-acetaminophen) ..... Use as directed  Orders: T-Ribs Unilateral 2 Views (71100TC)  Problem # 2:  OSTEOPOROSIS (ICD-733.00)  Her updated medication list for this problem includes:    Fosamax 70 Mg Tabs (Alendronate sodium) .Marland Kitchen... 1 q week  Her updated medication list for this problem includes:    Fosamax 70 Mg Tabs (Alendronate sodium) .Marland Kitchen... 1 q week  Complete Medication List: 1)  Fosamax 70 Mg Tabs (Alendronate sodium) .Marland Kitchen.. 1 q week 2)  Ultram 50 Mg Tabs (Tramadol hcl) .Marland Kitchen.. 1 q6h as needed 3)  Coreg 25 Mg Tabs (Carvedilol) .... One twice daily 4)  Zocor 40 Mg Tabs (Simvastatin) .Marland Kitchen.. 1 once daily 5)  Lisinopril-hydrochlorothiazide 20-12.5 Mg Tabs (Lisinopril-hydrochlorothiazide) .... Take 1 tablet by mouth once a day 6)  Hydrocodone-homatropine 5-1.5 Mg/72ml Syrp (Hydrocodone-homatropine) .Marland Kitchen.. 1 teaspoon every 6 hours as needed for cough 7)  Flonase 50 Mcg/act Susp (Fluticasone propionate) .... As needed 8)  Aspirin 81 Mg Tbec (Aspirin) .... Take one tablet by mouth daily 9)  Stool Softener 100 Mg Caps (Docusate sodium) .... Take 1 capsule by mouth two times a day 10)  Calcium Carbonate-vitamin D 600-400 Mg-unit Tabs (Calcium carbonate-vitamin d) .... Take 1 tablet by mouth two times a day 11)  Fish Oil 1000 Mg Caps (Omega-3 fatty acids)  .... Take 1 capsule by mouth two times a day 12)  Coq10 100 Mg Caps (Coenzyme q10) .... Take 1 capsule by mouth once a day 13)  Multivitamins Tabs (Multiple vitamin) .... Take 1 tablet by mouth once a day 14)  Diazepam 2 Mg Tabs (Diazepam) .... Take one every 8 to 12 hours for muscle pain 15)  Hydrocodone-acetaminophen 5-325 Mg Tabs (Hydrocodone-acetaminophen) .... Use as directed  Patient Instructions: 1)   x-rays of the right chest wall as scheduled 2)  follow for any worsening or shortness of breath Prescriptions: HYDROCODONE-ACETAMINOPHEN 5-325 MG TABS (HYDROCODONE-ACETAMINOPHEN) Use as directed  #50 x 4   Entered and Authorized by:   Gordy Savers  MD   Signed by:   Gordy Savers  MD on 07/07/2010   Method used:   Print then Give to Patient   RxID:   [id number]   Appended Document: BACK PAIN // RS     Allergies: 1)  ! Ms Contin (Morphine Sulfate) 2)  ! Ibuprofen   Complete Medication List: 1)  Fosamax 70 Mg Tabs (Alendronate sodium) .Marland Kitchen.. 1 q week 2)  Ultram 50 Mg Tabs (Tramadol hcl) .Marland Kitchen.. 1 q6h as needed 3)  Coreg 25 Mg Tabs (  Carvedilol) .... One twice daily 4)  Zocor 40 Mg Tabs (Simvastatin) .Marland Kitchen.. 1 once daily 5)  Lisinopril-hydrochlorothiazide 20-12.5 Mg Tabs (Lisinopril-hydrochlorothiazide) .... Take 1 tablet by mouth once a day 6)  Hydrocodone-homatropine 5-1.5 Mg/69ml Syrp (Hydrocodone-homatropine) .Marland Kitchen.. 1 teaspoon every 6 hours as needed for cough 7)  Flonase 50 Mcg/act Susp (Fluticasone propionate) .... As needed 8)  Aspirin 81 Mg Tbec (Aspirin) .... Take one tablet by mouth daily 9)  Stool Softener 100 Mg Caps (Docusate sodium) .... Take 1 capsule by mouth two times a day 10)  Calcium Carbonate-vitamin D 600-400 Mg-unit Tabs (Calcium carbonate-vitamin d) .... Take 1 tablet by mouth two times a day 11)  Fish Oil 1000 Mg Caps (Omega-3 fatty acids) .... Take 1 capsule by mouth two times a day 12)  Coq10 100 Mg Caps (Coenzyme q10) .... Take 1 capsule by  mouth once a day 13)  Multivitamins Tabs (Multiple vitamin) .... Take 1 tablet by mouth once a day 14)  Diazepam 2 Mg Tabs (Diazepam) .... Take one every 8 to 12 hours for muscle pain 15)  Hydrocodone-acetaminophen 5-325 Mg Tabs (Hydrocodone-acetaminophen) .... Use as directed  Other Orders: Depo- Medrol 40mg  (J1030) Admin of Therapeutic Inj  intramuscular or subcutaneous ([address])    Medication Administration  Injection # 1:    Medication: Depo- Medrol 40mg     Diagnosis: BACK PAIN (ICD-724.5)    Route: IM    Site: LUOQ gluteus    Exp Date: 02/2011    Lot #: [id number]    Mfr: Pharmacia    Patient tolerated injection without complications    Given by: Duard Brady LPN (July 09, 2010 11:27 AM)  Orders Added: 1)  Depo- Medrol 40mg  [J1030] 2)  Admin of Therapeutic Inj  intramuscular or subcutaneous [60454]

## 2010-12-18 NOTE — Assessment & Plan Note (Signed)
Summary: NEAR SYNCOPAL EPISODE / LIGHT-HEADED // RS   Vital Signs:  Patient profile:   77 year old female Weight:      107 pounds Temp:     98.3 degrees F oral BP sitting:   140 / 82  (right arm) Cuff size:   regular  Vitals Entered By: Duard Brady LPN (September 23, 2010 11:12 AM) CC: c/o lightheadedness and nausea - LOC yesterday while in restrm - was seen at [facility] last wk - UTI Is Patient Diabetic? No   CC:  c/o lightheadedness and nausea - LOC yesterday while in restrm - was seen at [facility] last wk - UTI.  History of Present Illness: 77 year old patient who has a history of hypertension and coronary artery disease.  Medical regimen includes lisinopril, hydrochlorothiazide, and coreg.  she has been treated recently for a UTI and is presently on antibiotic therapy.  Yesterday morning she awoke feeling well at approximately 10 a.m. she noted lightheadedness when she stood to ambulate to the kitchen.  Discussed with sat back down with the resolution of her symptoms.  She then ambulated  to the bathroom, where again, she became slightly lightheaded.  she had a syncopal episode when she stood from a sitting position on a commode.  This morning she developed some mild rhinorrhea, but general has done well.  She has had no further lightheadedness or nausea. She has treated hypertension and dyslipidemia.  Allergies: 1)  ! Ms Contin (Morphine Sulfate) 2)  ! Ibuprofen  Past History:  Past Medical History: Breast cancer, hx of  1994 (T3 N1 Mx) Ovarian CA, 2004-Clear cel-l Stage IA Coronary artery disease Hypertension Cardiomyopathy now improved Lymphedema Colonic polyps, hx of left bundle branch block COPD osteoporosis Hyperlipidemia Allergic rhinitis orthostatic syncope, November 2010  Past Surgical History: Reviewed history from 07/13/2007 and no changes required. Hysterectomy Mastectomy Cataract extraction Cholecystectomy L shoulder prosthesis R ankle  ORIF  Review of Systems       The patient complains of difficulty walking.  The patient denies anorexia, fever, weight loss, weight gain, vision loss, decreased hearing, hoarseness, chest pain, syncope, dyspnea on exertion, peripheral edema, prolonged cough, headaches, hemoptysis, abdominal pain, melena, hematochezia, severe indigestion/heartburn, hematuria, incontinence, genital sores, muscle weakness, suspicious skin lesions, transient blindness, depression, unusual weight change, abnormal bleeding, enlarged lymph nodes, angioedema, and breast masses.    Physical Exam  General:  Well-developed,well-nourished,in no acute distress; alert,appropriate and cooperative throughout examination; put pressure 140/80 without orthostatic change Head:  Normocephalic and atraumatic without obvious abnormalities. No apparent alopecia or balding. Mouth:  Oral mucosa and oropharynx without lesions or exudates.  Teeth in good repair. Neck:  No deformities, masses, or tenderness noted. Lungs:  Normal respiratory effort, chest expands symmetrically. Lungs are clear to auscultation, no crackles or wheezes. Heart:  Normal rate and regular rhythm. S1 and S2 normal without gallop, murmur, click, rub or other extra sounds. Abdomen:  Bowel sounds positive,abdomen soft and non-tender without masses, organomegaly or hernias noted. Msk:  No deformity or scoliosis noted of thoracic or lumbar spine.   Pulses:  R and L carotid,radial,femoral,dorsalis pedis and posterior tibial pulses are full and equal bilaterally Extremities:  No clubbing, cyanosis, edema, or deformity noted with normal full range of motion of all joints.     Impression & Recommendations:  Problem # 1:  SYNCOPE (ICD-780.2)  this appears to be orthostatic.  Will check lab and discontinue hydrochlorothiazide  Orders: Venipuncture ([address]) TLB-BMP (Basic Metabolic Panel-BMET) (80048-METABOL) TLB-CBC Platelet - w/Differential  (  85025-CBCD) TLB-Hepatic/Liver Function Pnl (80076-HEPATIC) TLB-TSH (Thyroid Stimulating Hormone) (84443-TSH) Specimen Handling ([address])  Problem # 2:  HYPERLIPIDEMIA (ICD-272.4)  Her updated medication list for this problem includes:    Zocor 40 Mg Tabs (Simvastatin) .Marland Kitchen... 1 once daily  Her updated medication list for this problem includes:    Zocor 40 Mg Tabs (Simvastatin) .Marland Kitchen... 1 once daily  Orders: Specimen Handling (60454)  Problem # 3:  COPD (ICD-496)  Orders: Venipuncture (09811) TLB-BMP (Basic Metabolic Panel-BMET) (80048-METABOL) TLB-CBC Platelet - w/Differential (85025-CBCD) TLB-Hepatic/Liver Function Pnl (80076-HEPATIC) TLB-TSH (Thyroid Stimulating Hormone) (84443-TSH) Specimen Handling (91478)  Problem # 4:  CORONARY ARTERY DISEASE (ICD-414.00)  The following medications were removed from the medication list:    Lisinopril-hydrochlorothiazide 20-12.5 Mg Tabs (Lisinopril-hydrochlorothiazide) .Marland Kitchen... Take 1 tablet by mouth once a day Her updated medication list for this problem includes:    Coreg 25 Mg Tabs (Carvedilol) ..... One twice daily    Aspirin 81 Mg Tbec (Aspirin) .Marland Kitchen... Take one tablet by mouth daily    Lisinopril 20 Mg Tabs (Lisinopril) ..... One daily    The following medications were removed from the medication list:    Lisinopril-hydrochlorothiazide 20-12.5 Mg Tabs (Lisinopril-hydrochlorothiazide) .Marland Kitchen... Take 1 tablet by mouth once a day Her updated medication list for this problem includes:    Coreg 25 Mg Tabs (Carvedilol) ..... One twice daily    Aspirin 81 Mg Tbec (Aspirin) .Marland Kitchen... Take one tablet by mouth daily    Lisinopril 20 Mg Tabs (Lisinopril) ..... One daily  Orders: Venipuncture (29562) TLB-BMP (Basic Metabolic Panel-BMET) (80048-METABOL) TLB-CBC Platelet - w/Differential (85025-CBCD) TLB-Hepatic/Liver Function Pnl (80076-HEPATIC) TLB-TSH (Thyroid Stimulating Hormone) (84443-TSH) Specimen Handling (13086)  Problem # 5:  HYPERTENSION  (ICD-401.9)  The following medications were removed from the medication list:    Lisinopril-hydrochlorothiazide 20-12.5 Mg Tabs (Lisinopril-hydrochlorothiazide) .Marland Kitchen... Take 1 tablet by mouth once a day Her updated medication list for this problem includes:    Coreg 25 Mg Tabs (Carvedilol) ..... One twice daily    Lisinopril 20 Mg Tabs (Lisinopril) ..... One daily    The following medications were removed from the medication list:    Lisinopril-hydrochlorothiazide 20-12.5 Mg Tabs (Lisinopril-hydrochlorothiazide) .Marland Kitchen... Take 1 tablet by mouth once a day Her updated medication list for this problem includes:    Coreg 25 Mg Tabs (Carvedilol) ..... One twice daily    Lisinopril 20 Mg Tabs (Lisinopril) ..... One daily  Orders: Venipuncture (57846) TLB-BMP (Basic Metabolic Panel-BMET) (80048-METABOL) TLB-CBC Platelet - w/Differential (85025-CBCD) TLB-Hepatic/Liver Function Pnl (80076-HEPATIC) TLB-TSH (Thyroid Stimulating Hormone) (84443-TSH) Specimen Handling (96295)  Complete Medication List: 1)  Fosamax 70 Mg Tabs (Alendronate sodium) .Marland Kitchen.. 1 q week 2)  Ultram 50 Mg Tabs (Tramadol hcl) .Marland Kitchen.. 1 q6h as needed 3)  Coreg 25 Mg Tabs (Carvedilol) .... One twice daily 4)  Zocor 40 Mg Tabs (Simvastatin) .Marland Kitchen.. 1 once daily 5)  Flonase 50 Mcg/act Susp (Fluticasone propionate) .... As needed 6)  Aspirin 81 Mg Tbec (Aspirin) .... Take one tablet by mouth daily 7)  Stool Softener 100 Mg Caps (Docusate sodium) .... Take 1 capsule by mouth two times a day 8)  Calcium Carbonate-vitamin D 600-400 Mg-unit Tabs (Calcium carbonate-vitamin d) .... Take 1 tablet by mouth two times a day 9)  Fish Oil 1000 Mg Caps (Omega-3 fatty acids) .... Take 1 capsule by mouth two times a day 10)  Coq10 100 Mg Caps (Coenzyme q10) .... Take 1 capsule by mouth once a day 11)  Multivitamins Tabs (Multiple vitamin) .... Take 1 tablet by mouth  once a day 12)  Diazepam 2 Mg Tabs (Diazepam) .... Take one every 8 to 12 hours for  muscle pain 13)  Hydrocodone-acetaminophen 5-325 Mg Tabs (Hydrocodone-acetaminophen) .... Use as directed 14)  Cipro 500 Mg Tabs (Ciprofloxacin hcl) .... Two times a day 15)  Cephalexin 500 Mg Caps (Cephalexin) .... Qid 16)  Lisinopril 20 Mg Tabs (Lisinopril) .... One daily  Patient Instructions: 1)  Please schedule a follow-up appointment in 1 month. 2)  Limit your Sodium (Salt). Prescriptions: LISINOPRIL 20 MG TABS (LISINOPRIL) one daily  #90 x 0   Entered and Authorized by:   Gordy Savers  MD   Signed by:   Gordy Savers  MD on 09/23/2010   Method used:   Electronically to        [facility] (retail)       [address]       [place], [place]  [address]       Ph: [phone]       Fax: [phone]   RxID:   [id number]    Orders Added: 1)  Est. Patient Level IV [84132] 2)  Venipuncture [44010] 3)  TLB-BMP (Basic Metabolic Panel-BMET) [80048-METABOL] 4)  TLB-CBC Platelet - w/Differential [85025-CBCD] 5)  TLB-Hepatic/Liver Function Pnl [80076-HEPATIC] 6)  TLB-TSH (Thyroid Stimulating Hormone) [84443-TSH] 7)  Specimen Handling [99000]

## 2010-12-18 NOTE — Letter (Signed)
Summary: [facility]  [facility]   Imported By: Sherian Rein 09/30/2010 12:51:00  _____________________________________________________________________  External Attachment:    Type:   Image     Comment:   External Document

## 2010-12-18 NOTE — Assessment & Plan Note (Signed)
Summary: fup from urgent care/[facility]   Vital Signs:  Patient profile:   77 year old female Weight:      109 pounds Temp:     98.1 degrees F oral BP sitting:   130 / 70  (left arm) Cuff size:   regular  Vitals Entered By: Kathrynn Speed CMA (July 03, 2010 11:13 AM) CC: fup from urgent care, last thursday & again Thursaday night, Pain in right lower shoulder has moved to under rib cage, src   CC:  fup from urgent care, last thursday & again Thursaday night, Pain in right lower shoulder has moved to under rib cage, and src.  History of Present Illness: 77 year old patient who is seen today for follow-up of back pain.  This began 7 days ago and is more in the right lower thoracic region with radiation to the right flank area.  She states she had a lumbar MRI that revealed a " PINCHED NERVE."  pain is significantly improved today.  Denies any skin rash.  She does have treated osteoporosis, as well as hypertension.  She has prescriptions for Haldol, as well as hydrocodone.  She was also is prescribed low-dose diazepam for a muscle relaxer  Current Medications (verified): 1)  Fosamax 70 Mg  Tabs (Alendronate Sodium) .Marland Kitchen.. 1 Q Week 2)  Ultram 50 Mg  Tabs (Tramadol Hcl) .Marland Kitchen.. 1 Q6h As Needed 3)  Coreg 25 Mg  Tabs (Carvedilol) .... One Twice Daily 4)  Zocor 40 Mg  Tabs (Simvastatin) .Marland Kitchen.. 1 Once Daily 5)  Lisinopril-Hydrochlorothiazide 20-12.5 Mg Tabs (Lisinopril-Hydrochlorothiazide) .... Take 1 Tablet By Mouth Once A Day 6)  Hydrocodone-Homatropine 5-1.5 Mg/79ml Syrp (Hydrocodone-Homatropine) .Marland Kitchen.. 1 Teaspoon Every 6 Hours As Needed For Cough 7)  Flonase 50 Mcg/act Susp (Fluticasone Propionate) .... As Needed 8)  Aspirin 81 Mg Tbec (Aspirin) .... Take One Tablet By Mouth Daily 9)  Stool Softener 100 Mg Caps (Docusate Sodium) .... Take 1 Capsule By Mouth Two Times A Day 10)  Calcium Carbonate-Vitamin D 600-400 Mg-Unit  Tabs (Calcium Carbonate-Vitamin D) .... Take 1 Tablet By Mouth Two Times A Day 11)   Fish Oil 1000 Mg Caps (Omega-3 Fatty Acids) .... Take 1 Capsule By Mouth Two Times A Day 12)  Coq10 100 Mg Caps (Coenzyme Q10) .... Take 1 Capsule By Mouth Once A Day 13)  Multivitamins  Tabs (Multiple Vitamin) .... Take 1 Tablet By Mouth Once A Day 14)  Diazepam 2 Mg Tabs (Diazepam) .... Take One Every 8 To 12 Hours For Muscle Pain 15)  Hydrocodone-Acetaminophen 5-325 Mg Tabs (Hydrocodone-Acetaminophen) .... Use As Directed  Allergies: 1)  ! Ms Contin (Morphine Sulfate) 2)  ! Ibuprofen  Past History:  Past Medical History: Reviewed history from 01/15/2010 and no changes required. Breast cancer, hx of  1994 Ovarian CA Coronary artery disease Hypertension Cardiomyopathy now improved Lymphedema Colonic polyps, hx of left bundle branch block COPD osteoporosis Hyperlipidemia Allergic rhinitis  Review of Systems  The patient denies anorexia, fever, weight loss, weight gain, vision loss, decreased hearing, hoarseness, chest pain, syncope, dyspnea on exertion, peripheral edema, prolonged cough, headaches, hemoptysis, abdominal pain, melena, hematochezia, severe indigestion/heartburn, hematuria, incontinence, genital sores, muscle weakness, suspicious skin lesions, transient blindness, difficulty walking, depression, unusual weight change, abnormal bleeding, enlarged lymph nodes, angioedema, and breast masses.    Physical Exam  General:  Well-developed,well-nourished,in no acute distress; alert,appropriate and cooperative throughout examination Head:  Normocephalic and atraumatic without obvious abnormalities. No apparent alopecia or balding. Mouth:  Oral mucosa and oropharynx  without lesions or exudates.  Teeth in good repair. Neck:  No deformities, masses, or tenderness noted. Chest Wall:  no significant tenderness along the right chest wall area. Lungs:  Normal respiratory effort, chest expands symmetrically. Lungs are clear to auscultation, no crackles or wheezes. Heart:  Normal  rate and regular rhythm. S1 and S2 normal without gallop, murmur, click, rub or other extra sounds. Abdomen:  benign   Impression & Recommendations:  Problem # 1:  BACK PAIN (ICD-724.5)  Her updated medication list for this problem includes:    Ultram 50 Mg Tabs (Tramadol hcl) .Marland Kitchen... 1 q6h as needed    Aspirin 81 Mg Tbec (Aspirin) .Marland Kitchen... Take one tablet by mouth daily    Hydrocodone-acetaminophen 5-325 Mg Tabs (Hydrocodone-acetaminophen) ..... Use as directed because of her back pain remains obscure.  Level is much higher than a typical radiculopathy.  Concern at risk for a thoracic compression fracture.  She is clinically improved.  Will continue to observe at this time  Problem # 2:  OSTEOPOROSIS (ICD-733.00)  Her updated medication list for this problem includes:    Fosamax 70 Mg Tabs (Alendronate sodium) .Marland Kitchen... 1 q week  Complete Medication List: 1)  Fosamax 70 Mg Tabs (Alendronate sodium) .Marland Kitchen.. 1 q week 2)  Ultram 50 Mg Tabs (Tramadol hcl) .Marland Kitchen.. 1 q6h as needed 3)  Coreg 25 Mg Tabs (Carvedilol) .... One twice daily 4)  Zocor 40 Mg Tabs (Simvastatin) .Marland Kitchen.. 1 once daily 5)  Lisinopril-hydrochlorothiazide 20-12.5 Mg Tabs (Lisinopril-hydrochlorothiazide) .... Take 1 tablet by mouth once a day 6)  Hydrocodone-homatropine 5-1.5 Mg/20ml Syrp (Hydrocodone-homatropine) .Marland Kitchen.. 1 teaspoon every 6 hours as needed for cough 7)  Flonase 50 Mcg/act Susp (Fluticasone propionate) .... As needed 8)  Aspirin 81 Mg Tbec (Aspirin) .... Take one tablet by mouth daily 9)  Stool Softener 100 Mg Caps (Docusate sodium) .... Take 1 capsule by mouth two times a day 10)  Calcium Carbonate-vitamin D 600-400 Mg-unit Tabs (Calcium carbonate-vitamin d) .... Take 1 tablet by mouth two times a day 11)  Fish Oil 1000 Mg Caps (Omega-3 fatty acids) .... Take 1 capsule by mouth two times a day 12)  Coq10 100 Mg Caps (Coenzyme q10) .... Take 1 capsule by mouth once a day 13)  Multivitamins Tabs (Multiple vitamin) .... Take 1  tablet by mouth once a day 14)  Diazepam 2 Mg Tabs (Diazepam) .... Take one every 8 to 12 hours for muscle pain 15)  Hydrocodone-acetaminophen 5-325 Mg Tabs (Hydrocodone-acetaminophen) .... Use as directed  Patient Instructions: 1)  Take calcium +Vitamin D daily. 2)  take pain medications as directed 3)  call if unimproved   Medication Administration  Injection # 1:    Medication: Depo- Medrol 40mg   Orders Added: 1)  Est. Patient Level III [04540]   Appended Document: fup from urgent care/[facility]     Allergies: 1)  ! Ms Contin (Morphine Sulfate) 2)  ! Ibuprofen   Complete Medication List: 1)  Fosamax 70 Mg Tabs (Alendronate sodium) .Marland Kitchen.. 1 q week 2)  Ultram 50 Mg Tabs (Tramadol hcl) .Marland Kitchen.. 1 q6h as needed 3)  Coreg 25 Mg Tabs (Carvedilol) .... One twice daily 4)  Zocor 40 Mg Tabs (Simvastatin) .Marland Kitchen.. 1 once daily 5)  Lisinopril-hydrochlorothiazide 20-12.5 Mg Tabs (Lisinopril-hydrochlorothiazide) .... Take 1 tablet by mouth once a day 6)  Hydrocodone-homatropine 5-1.5 Mg/10ml Syrp (Hydrocodone-homatropine) .Marland Kitchen.. 1 teaspoon every 6 hours as needed for cough 7)  Flonase 50 Mcg/act Susp (Fluticasone propionate) .... As needed 8)  Aspirin 81 Mg Tbec (Aspirin) .... Take one tablet by mouth daily 9)  Stool Softener 100 Mg Caps (Docusate sodium) .... Take 1 capsule by mouth two times a day 10)  Calcium Carbonate-vitamin D 600-400 Mg-unit Tabs (Calcium carbonate-vitamin d) .... Take 1 tablet by mouth two times a day 11)  Fish Oil 1000 Mg Caps (Omega-3 fatty acids) .... Take 1 capsule by mouth two times a day 12)  Coq10 100 Mg Caps (Coenzyme q10) .... Take 1 capsule by mouth once a day 13)  Multivitamins Tabs (Multiple vitamin) .... Take 1 tablet by mouth once a day 14)  Diazepam 2 Mg Tabs (Diazepam) .... Take one every 8 to 12 hours for muscle pain 15)  Hydrocodone-acetaminophen 5-325 Mg Tabs (Hydrocodone-acetaminophen) .... Use as directed  Other Orders: Depo- Medrol 40mg  (J1030) Admin  of Therapeutic Inj  intramuscular or subcutaneous (13086)    Medication Administration  Injection # 1:    Medication: Depo- Medrol 40mg     Diagnosis: BACK PAIN (ICD-724.5)    Route: IM    Site: R deltoid    Exp Date: 02/2011    Lot #: [id number]    Mfr: Pharmacia    Patient tolerated injection without complications    Given by: Duard Brady LPN (July 04, 2010 12:10 PM)  Orders Added: 1)  Depo- Medrol 40mg  [J1030] 2)  Admin of Therapeutic Inj  intramuscular or subcutaneous [57846]

## 2010-12-19 ENCOUNTER — Emergency Department (INDEPENDENT_AMBULATORY_CARE_PROVIDER_SITE_OTHER): Payer: MEDICARE

## 2010-12-19 ENCOUNTER — Emergency Department (HOSPITAL_BASED_OUTPATIENT_CLINIC_OR_DEPARTMENT_OTHER)
Admission: EM | Admit: 2010-12-19 | Discharge: 2010-12-19 | Disposition: A | Discharge status: Home / Self Care | Payer: MEDICARE | Attending: Emergency Medicine | Admitting: Emergency Medicine

## 2010-12-19 DIAGNOSIS — M810 Age-related osteoporosis without current pathological fracture: Secondary | ICD-10-CM | POA: Insufficient documentation

## 2010-12-19 DIAGNOSIS — IMO0002 Reserved for concepts with insufficient information to code with codable children: Secondary | ICD-10-CM | POA: Insufficient documentation

## 2010-12-19 DIAGNOSIS — I10 Essential (primary) hypertension: Secondary | ICD-10-CM | POA: Insufficient documentation

## 2010-12-19 DIAGNOSIS — E7800 Pure hypercholesterolemia, unspecified: Secondary | ICD-10-CM | POA: Insufficient documentation

## 2010-12-19 DIAGNOSIS — T18108A Unspecified foreign body in esophagus causing other injury, initial encounter: Secondary | ICD-10-CM | POA: Insufficient documentation

## 2010-12-19 DIAGNOSIS — Z0389 Encounter for observation for other suspected diseases and conditions ruled out: Secondary | ICD-10-CM

## 2010-12-19 DIAGNOSIS — R6889 Other general symptoms and signs: Secondary | ICD-10-CM

## 2010-12-19 DIAGNOSIS — Z79899 Other long term (current) drug therapy: Secondary | ICD-10-CM | POA: Insufficient documentation

## 2010-12-25 ENCOUNTER — Encounter (INDEPENDENT_AMBULATORY_CARE_PROVIDER_SITE_OTHER): Payer: Self-pay | Admitting: *Deleted

## 2011-01-01 NOTE — Letter (Addendum)
Summary: Pre Visit Letter Revised  [facility]  [address] [place], [place] [address]   Phone: [phone]  Fax: [phone]        12/25/2010 MRN: [id number] Judy Herrera [address] [place] [place], [place]  [address]             Procedure Date:  02-10-11   Welcome to the Gastroenterology Division at [facility].    You are scheduled to see a nurse for your pre-procedure visit on 01-28-11 at 10:00A.M. on the 3rd floor at [facility], [address]  We ask that you try to arrive at our office 15 minutes prior to your appointment time to allow for check-in.  Please take a minute to review the attached form.  If you answer "Yes" to one or more of the questions on the first page, we ask that you call the person listed at your earliest opportunity.  If you answer "No" to all of the questions, please complete the rest of the form and bring it to your appointment.    Your nurse visit will consist of discussing your medical and surgical history, your immediate family medical history, and your medications.   If you are unable to list all of your medications on the form, please bring the medication bottles to your appointment and we will list them.  We will need to be aware of both prescribed and over the counter drugs.  We will need to know exact dosage information as well.    Please be prepared to read and sign documents such as consent forms, a financial agreement, and acknowledgement forms.  If necessary, and with your consent, a friend or relative is welcome to sit-in on the nurse visit with you.  Please bring your insurance card so that we may make a copy of it.  If your insurance requires a referral to see a specialist, please bring your referral form from your primary care physician.  No co-pay is required for this nurse visit.     If you cannot keep your appointment, please call [phone] to cancel or reschedule prior to your appointment date.  This  allows [place] the opportunity to schedule an appointment for another patient in need of care.    Thank you for choosing [facility] for your medical needs.  We appreciate the opportunity to care for you.  Please visit [place] at our website  to learn more about our practice.  Sincerely, The Gastroenterology Division          Appended Document: [facility]     Past History:  Family History: Last updated: 28-Jan-2011 the father died age 29 MI mother died age 21 MI one brother casualty of the [place] war 4 sisters, history of breast cancer  Social History: Last updated: 11/29/2008 Married Never Smoked Alcohol Use - no  Risk Factors: Smoking Status: never (10/03/2010)  Past Medical History: Reviewed history from 09/23/2010 and no changes required. Breast cancer, hx of  1994 (T3 N1 Mx) Ovarian CA, 2004-Clear cel-l Stage IA Coronary artery disease Hypertension Cardiomyopathy now improved Lymphedema Colonic polyps, hx of left bundle branch block COPD osteoporosis Hyperlipidemia Allergic rhinitis orthostatic syncope, November 2010  Past Surgical History: Reviewed history from 07/13/2007 and no changes required. Hysterectomy Mastectomy Cataract extraction Cholecystectomy L shoulder prosthesis R ankle ORIF  Problems Prior to Update: 1)  Preventive Health Care  (ICD-V70.0) 2)  Tendinitis, Right Wrist  (ICD-727.05) 3)  Wrist Pain, Right  (ICD-719.43) 4)  Hypertension  (ICD-401.9) 5)  Syncope  (ICD-780.2) 6)  Syncope  (ICD-780.2) 7)  Benign Neoplasm Skin Other&unspec Parts Face  (ICD-216.3) 8)  Back Pain  (ICD-724.5) 9)  Hyperlipidemia  (ICD-272.4) 10)  Other Primary Cardiomyopathies  (ICD-425.4) 11)  Allergic Rhinitis  (ICD-477.9) 12)  Osteoporosis  (ICD-733.00) 13)  Uti  (ICD-599.0) 14)  COPD  (ICD-496) 15)  Upper Respiratory Infection, Viral  (ICD-465.9) 16)  Colonic Polyps, Hx of  (ICD-V12.72) 17)  Hypertension  (ICD-401.9) 18)  Coronary  Artery Disease  (ICD-414.00) 19)  Breast Cancer, Hx of  (ICD-V10.3)  Current Problems (verified): 1)  Preventive Health Care  (ICD-V70.0) 2)  Tendinitis, Right Wrist  (ICD-727.05) 3)  Wrist Pain, Right  (ICD-719.43) 4)  Hypertension  (ICD-401.9) 5)  Syncope  (ICD-780.2) 6)  Syncope  (ICD-780.2) 7)  Benign Neoplasm Skin Other&unspec Parts Face  (ICD-216.3) 8)  Back Pain  (ICD-724.5) 9)  Hyperlipidemia  (ICD-272.4) 10)  Other Primary Cardiomyopathies  (ICD-425.4) 11)  Allergic Rhinitis  (ICD-477.9) 12)  Osteoporosis  (ICD-733.00) 13)  Uti  (ICD-599.0) 14)  COPD  (ICD-496) 15)  Upper Respiratory Infection, Viral  (ICD-465.9) 16)  Colonic Polyps, Hx of  (ICD-V12.72) 17)  Hypertension  (ICD-401.9) 18)  Coronary Artery Disease  (ICD-414.00) 19)  Breast Cancer, Hx of  (ICD-V10.3)  Medications Prior to Update: 1)  Ultram 50 Mg  Tabs (Tramadol Hcl) .Marland Kitchen.. 1 Q6h As Needed 2)  Coreg 25 Mg  Tabs (Carvedilol) .... One Twice Daily 3)  Zocor 40 Mg  Tabs (Simvastatin) .Marland Kitchen.. 1 Once Daily 4)  Flonase 50 Mcg/act Susp (Fluticasone Propionate) .... As Needed 5)  Aspirin 81 Mg Tbec (Aspirin) .... Take One Tablet By Mouth Daily 6)  Stool Softener 100 Mg Caps (Docusate Sodium) .... Take 1 Capsule By Mouth Two Times A Day 7)  Calcium Carbonate-Vitamin D 600-400 Mg-Unit  Tabs (Calcium Carbonate-Vitamin D) .... Take 1 Tablet By Mouth Two Times A Day 8)  Fish Oil 1000 Mg Caps (Omega-3 Fatty Acids) .... Take 1 Capsule By Mouth Two Times A Day 9)  Coq10 100 Mg Caps (Coenzyme Q10) .... Take 1 Capsule By Mouth Once A Day 10)  Multivitamins  Tabs (Multiple Vitamin) .... Take 1 Tablet By Mouth Once A Day 11)  Lisinopril 20 Mg Tabs (Lisinopril) .... One Twice Daily 12)  Amlodipine Besylate 2.5 Mg Tabs (Amlodipine Besylate) .... One Every Morning  Current Medications (verified): 1)  Ultram 50 Mg  Tabs (Tramadol Hcl) .Marland Kitchen.. 1 Q6h As Needed 2)  Coreg 25 Mg  Tabs (Carvedilol) .... One Twice Daily 3)  Zocor 40 Mg  Tabs  (Simvastatin) .Marland Kitchen.. 1 Once Daily 4)  Flonase 50 Mcg/act Susp (Fluticasone Propionate) .... As Needed 5)  Aspirin 81 Mg Tbec (Aspirin) .... Take One Tablet By Mouth Daily 6)  Stool Softener 100 Mg Caps (Docusate Sodium) .... Take 1 Capsule By Mouth Two Times A Day 7)  Calcium Carbonate-Vitamin D 600-400 Mg-Unit  Tabs (Calcium Carbonate-Vitamin D) .... Take 1 Tablet By Mouth Two Times A Day 8)  Fish Oil 1000 Mg Caps (Omega-3 Fatty Acids) .... Take 1 Capsule By Mouth Two Times A Day 9)  Coq10 100 Mg Caps (Coenzyme Q10) .... Take 1 Capsule By Mouth Once A Day 10)  Multivitamins  Tabs (Multiple Vitamin) .... Take 1 Tablet By Mouth Once A Day 11)  Lisinopril 20 Mg Tabs (Lisinopril) .... One Twice Daily 12)  Amlodipine Besylate 2.5 Mg Tabs (Amlodipine Besylate) .... One Every Morning  Allergies (verified): 1)  !  Ms Contin (Morphine Sulfate) 2)  ! Ibuprofen   Family History: Reviewed history from 09/08/2007 and no changes required. the father died age 61 MI mother died age 85 MI one brother casualty of the [place] war 4 sisters, history of breast cancer  Social History: Reviewed history from 11/29/2008 and no changes required. Married Never Smoked Alcohol Use - no  CXR  Procedure date:  12/19/2010  Findings:       CHEST - 2 VIEW    Comparison: 07/08/2010    Findings: Lateral view degraded by patient arm position.    Moderate osteopenia.  Left shoulder arthroplasty. Midline trachea.   Normal heart size and mediastinal contours for age.  No pleural   effusion or pneumothorax.  No radiopaque foreign object identified   Clear lungs.    Surgical clips project over the left axilla.  Suspect left-sided   mastectomy.    IMPRESSION:   Hyperinflation without acute cardiopulmonary disease.  No evidence   of radiopaque foreign object.    Original Report Authenticated By: Consuello Bossier, M.D.  Additional Information  HL7 RESULT STATUS : F  External image : [id number]   External IF Update Timestamp : 2010-12-19:20:26:00.000000

## 2011-01-22 ENCOUNTER — Telehealth: Payer: Self-pay | Admitting: *Deleted

## 2011-01-22 NOTE — Telephone Encounter (Signed)
patient  Would like a refill of hydromet cough syrup.  [facility]

## 2011-01-22 NOTE — Telephone Encounter (Signed)
Ok 6 oz 

## 2011-01-23 ENCOUNTER — Other Ambulatory Visit: Payer: Self-pay | Admitting: *Deleted

## 2011-01-23 ENCOUNTER — Encounter: Payer: Self-pay | Admitting: *Deleted

## 2011-01-23 MED ORDER — HYDROCODONE-HOMATROPINE 5-1.5 MG/5ML PO SYRP: 2.5000 mL | ORAL_SOLUTION | Freq: Four times a day (QID) | ORAL | Status: AC | PRN

## 2011-01-23 NOTE — Telephone Encounter (Signed)
Was this done yesterday? 

## 2011-01-23 NOTE — Telephone Encounter (Signed)
This was taken care of by Fleet Contras this AM

## 2011-01-27 LAB — BASIC METABOLIC PANEL
BUN: 17 mg/dL (ref 6–23)
Calcium: 8.9 mg/dL (ref 8.4–10.5)
Creatinine, Ser: 1 mg/dL (ref 0.4–1.2)
GFR calc non Af Amer: 54 mL/min — ABNORMAL LOW (ref >60)
Potassium: 4.1 mEq/L (ref 3.5–5.1)
Sodium: 127 mEq/L — ABNORMAL LOW (ref 135–145)

## 2011-01-27 LAB — DIFFERENTIAL
Eosinophils Absolute: 0.2 10*3/uL (ref 0.0–0.7)
Eosinophils Relative: 2 % (ref 0–5)
Lymphocytes Relative: 14 % (ref 12–46)
Lymphs Abs: 1 10*3/uL (ref 0.7–4.0)
Neutro Abs: 5.8 10*3/uL (ref 1.7–7.7)
Neutrophils Relative %: 76 % (ref 43–77)

## 2011-01-27 LAB — URINALYSIS, ROUTINE W REFLEX MICROSCOPIC
Ketones, ur: NEGATIVE mg/dL
Protein, ur: NEGATIVE mg/dL
pH: 7.5 (ref 5.0–8.0)

## 2011-01-27 LAB — CBC
Hemoglobin: 10.6 g/dL — ABNORMAL LOW (ref 12.0–15.0)
MCV: 94.4 fL (ref 78.0–100.0)
WBC: 7.6 10*3/uL (ref 4.0–10.5)

## 2011-01-27 LAB — URINE CULTURE: Culture  Setup Time: 201111031801

## 2011-01-27 LAB — URINE MICROSCOPIC-ADD ON

## 2011-01-28 ENCOUNTER — Encounter: Payer: Self-pay | Admitting: Gastroenterology

## 2011-01-28 ENCOUNTER — Ambulatory Visit (INDEPENDENT_AMBULATORY_CARE_PROVIDER_SITE_OTHER): Payer: Medicare Other | Admitting: Cardiology

## 2011-01-28 ENCOUNTER — Encounter: Payer: Self-pay | Admitting: Cardiology

## 2011-01-28 DIAGNOSIS — I10 Essential (primary) hypertension: Secondary | ICD-10-CM

## 2011-01-28 DIAGNOSIS — I428 Other cardiomyopathies: Secondary | ICD-10-CM

## 2011-01-30 LAB — URINALYSIS, ROUTINE W REFLEX MICROSCOPIC
Nitrite: NEGATIVE
Protein, ur: NEGATIVE mg/dL
pH: 7 (ref 5.0–8.0)

## 2011-01-30 LAB — URINE MICROSCOPIC-ADD ON

## 2011-02-03 NOTE — Letter (Signed)
Summary: [facility] Instructions  [facility]  [address] [place], [place] [address]   Phone: [phone]  Fax: [phone]       Judy Herrera    05-28-1931    MRN: [id number]        Procedure Day Dorna Bloom:  Jake Shark  02/10/11     Arrival Time:  7:30AM     Procedure Time:  8:30AM     Location of Procedure:                    _ X_  [facility] (4th Floor)                      PREPARATION FOR COLONOSCOPY WITH MOVIPREP   Starting 5 days prior to your procedure 02/05/11 do not eat nuts, seeds, popcorn, corn, beans, peas,  salads, or any raw vegetables.  Do not take any fiber supplements (e.g. Metamucil, Citrucel, and Benefiber).  THE DAY BEFORE YOUR PROCEDURE         DATE: 02/09/11   DAY: MONDAY  1.  Drink clear liquids the entire day-NO SOLID FOOD  2.  Do not drink anything colored red or purple.  Avoid juices with pulp.  No orange juice.  3.  Drink at least 64 oz. (8 glasses) of fluid/clear liquids during the day to prevent dehydration and help the prep work efficiently.  CLEAR LIQUIDS INCLUDE: Water Jello Ice Popsicles Tea (sugar ok, no milk/cream) Powdered fruit flavored drinks Coffee (sugar ok, no milk/cream) Gatorade Juice: apple, white grape, white cranberry  Lemonade Clear bullion, consomm, broth Carbonated beverages (any kind) Strained chicken noodle soup Hard Candy                             4.  In the morning, mix first dose of MoviPrep solution:    Empty 1 Pouch A and 1 Pouch B into the disposable container    Add lukewarm drinking water to the top line of the container. Mix to dissolve    Refrigerate (mixed solution should be used within 24 hrs)  5.  Begin drinking the prep at 5:00 p.m. The MoviPrep container is divided by 4 marks.   Every 15 minutes drink the solution down to the next mark (approximately 8 oz) until the full liter is complete.   6.  Follow completed prep with 16 oz of clear liquid of your choice (Nothing  red or purple).  Continue to drink clear liquids until bedtime.  7.  Before going to bed, mix second dose of MoviPrep solution:    Empty 1 Pouch A and 1 Pouch B into the disposable container    Add lukewarm drinking water to the top line of the container. Mix to dissolve    Refrigerate  THE DAY OF YOUR PROCEDURE      DATE: 02/10/11   DAY: TUESDAY  Beginning at 3:30AM (5 hours before procedure):         1. Every 15 minutes, drink the solution down to the next mark (approx 8 oz) until the full liter is complete.  2. Follow completed prep with 16 oz. of clear liquid of your choice.    3. You may drink clear liquids until 6:30AM (2 HOURS BEFORE PROCEDURE).   MEDICATION INSTRUCTIONS  Unless otherwise instructed, you should take regular prescription medications with a small sip of water   as early as possible the morning  of your procedure.         OTHER INSTRUCTIONS  You will need a responsible adult at least 75 years of age to accompany you and drive you home.   This person must remain in the waiting room during your procedure.  Wear loose fitting clothing that is easily removed.  Leave jewelry and other valuables at home.  However, you may wish to bring a book to read or  an iPod/MP3 player to listen to music as you wait for your procedure to start.  Remove all body piercing jewelry and leave at home.  Total time from sign-in until discharge is approximately 2-3 hours.  You should go home directly after your procedure and rest.  You can resume normal activities the  day after your procedure.  The day of your procedure you should not:   Drive   Make legal decisions   Operate machinery   Drink alcohol   Return to work  You will receive specific instructions about eating, activities and medications before you leave.    The above instructions have been reviewed and explained to me by  Karl Bales RN  January 28, 2011 10:19 AM    I fully understand and  can verbalize these instructions _____________________________ Date _________

## 2011-02-03 NOTE — Miscellaneous (Signed)
Summary: [facility] previsit  Clinical Lists Changes  Medications: Added new medication of MOVIPREP 100 GM  SOLR (PEG-KCL-NACL-NASULF-NA ASC-C) As per prep instructions. - Signed Rx of MOVIPREP 100 GM  SOLR (PEG-KCL-NACL-NASULF-NA ASC-C) As per prep instructions.;  #1 x 0;  Signed;  Entered by: Karl Bales RN;  Authorized by: Louis Meckel MD;  Method used: Electronically to [facility] [address] [place], [place]  [address], Ph: [phone], Fax: [phone] Observations: Added new observation of ALLERGY REV: Done (01/28/2011 9:53)    Prescriptions: MOVIPREP 100 GM  SOLR (PEG-KCL-NACL-NASULF-NA ASC-C) As per prep instructions.  #1 x 0   Entered by:   Karl Bales RN   Authorized by:   Louis Meckel MD   Signed by:   Karl Bales RN on 01/28/2011   Method used:   Electronically to        [facility] (retail)       [address]       [place], [place]  [address]       Ph: [phone]       Fax: [phone]   RxID:   [id number]

## 2011-02-03 NOTE — Assessment & Plan Note (Signed)
Summary: f1y/dm per pt call mj/tt   Visit Type:  1 year follow up Primary Provider:  Gordy Savers  MD  CC:  No complaints.  History of Present Illness: Judy Herrera is a very pleasant  female with a history of cardiomyopathy out of proportion to coronary artery disease.  She did have a cardiac catheterization performed on August 18, 2005.  At that time, she had a 60% OM, but otherwise nonobstructive coronary artery disease.  Ejection fraction was 45%.  The last echocardiogram in January 2010 showed normal LV function, mild aortic insufficiency and mitral regurgitation. I last saw her in March of 2011. Since then the patient has dyspnea with more extreme activities but not with routine activities. It is relieved with rest. It is not associated with chest pain. There is no orthopnea, PND or pedal edema. There is no syncope or palpitations. There is no exertional chest pain.   Current Medications (verified): 1)  Coreg 25 Mg  Tabs (Carvedilol) .... One Twice Daily 2)  Zocor 40 Mg  Tabs (Simvastatin) .Marland Kitchen.. 1 Once Daily 3)  Flonase 50 Mcg/act Susp (Fluticasone Propionate) .... As Needed 4)  Aspirin 81 Mg Tbec (Aspirin) .... Take One Tablet By Mouth Daily 5)  Stool Softener 100 Mg Caps (Docusate Sodium) .... Take 1 Capsule By Mouth Two Times A Day 6)  Calcium Carbonate-Vitamin D 600-400 Mg-Unit  Tabs (Calcium Carbonate-Vitamin D) .... Take 1 Tablet By Mouth Two Times A Day 7)  Lisinopril 20 Mg Tabs (Lisinopril) .... One Twice Daily 8)  Amlodipine Besylate 2.5 Mg Tabs (Amlodipine Besylate) .... One Every Morning 9)  Loratadine 10 Mg Tabs (Loratadine) .... As Needed  Allergies: 1)  ! Ms Contin (Morphine Sulfate) 2)  ! Ibuprofen  Past History:  Past Medical History: Reviewed history from 09/23/2010 and no changes required. 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 Hysterectomy Mastectomy Cataract extraction Cholecystectomy L shoulder prosthesis R ankle ORIF  Social History: Reviewed history from 11/29/2008 and no changes required. Married Never Smoked Alcohol Use - no  Review of Systems       no fevers or chills, productive cough, hemoptysis, dysphasia, odynophagia, melena, hematochezia, dysuria, hematuria, rash, seizure activity, orthopnea, PND, pedal edema, claudication. Remaining systems are negative.   Vital Signs:  Patient profile:   77 year old female Height:      63.5 inches Weight:      107.75 pounds BMI:     18.86 Pulse rate:   66 / minute Pulse rhythm:   regular Resp:     18 per minute BP sitting:   119 / 63  (right arm) Cuff size:   small  Vitals Entered By: Vikki Ports (January 28, 2011 2:51 PM)  Physical Exam  General:  Well-developed well-nourished in no acute distress.  Skin is warm and dry.  HEENT is normal.  Neck is supple. No thyromegaly.  Chest is clear to auscultation with normal expansion.  Cardiovascular exam is regular rate and rhythm.  Abdominal exam nontender or distended. No masses palpated. Extremities show no edema. neuro grossly intact    EKG  Procedure date:  01/28/2011  Findings:      Sinus with LBBB  Impression & Recommendations:  Problem # 1:  HYPERTENSION (ICD-401.9) Blood pressure controlled on present medications. Will continue. Potassium and renal function monitored by primary care. Her updated medication list for this problem includes:    Coreg 25 Mg Tabs (Carvedilol) ..... One twice daily    Aspirin 81 Mg Tbec (Aspirin) .Marland Kitchen... Take one tablet by mouth daily    Lisinopril 20 Mg Tabs (Lisinopril) ..... One twice daily    Amlodipine Besylate 2.5 Mg Tabs (Amlodipine  besylate) ..... One every morning  Problem # 2:  HYPERLIPIDEMIA (ICD-272.4) Continue statin. Lipids and liver monitor for primary care. Her updated medication list for this problem includes:    Zocor 40 Mg Tabs (Simvastatin) .Marland Kitchen... 1 once daily  Problem # 3:  OTHER PRIMARY CARDIOMYOPATHIES (ICD-425.4) Improved on most recent echo. Continue beta blocker and ACE inhibitor. Her updated medication list for this problem includes:    Coreg 25 Mg Tabs (Carvedilol) ..... One twice daily    Aspirin 81 Mg Tbec (Aspirin) .Marland Kitchen... Take one tablet by mouth daily    Lisinopril 20 Mg Tabs (Lisinopril) ..... One twice daily    Amlodipine Besylate 2.5 Mg Tabs (Amlodipine besylate) ..... One every morning  Problem # 4:  COPD (ICD-496)  Problem # 5:  CORONARY ARTERY DISEASE (ICD-414.00) Continue aspirin and statin. Her updated medication list for this problem includes:    Coreg 25 Mg Tabs (Carvedilol) ..... One twice daily    Aspirin 81 Mg Tbec (Aspirin) .Marland Kitchen... Take one tablet by mouth daily    Lisinopril 20 Mg Tabs (Lisinopril) ..... One twice daily    Amlodipine Besylate 2.5 Mg Tabs (Amlodipine besylate) ..... One every morning  Patient Instructions: 1)  Your physician wants you to follow-up in: ONE YEAR  You will receive a reminder letter in the mail two months in advance. If you don't receive a letter, please call our office to schedule the follow-up appointment.

## 2011-02-09 ENCOUNTER — Encounter: Payer: Self-pay | Admitting: Gastroenterology

## 2011-02-10 ENCOUNTER — Ambulatory Visit (AMBULATORY_SURGERY_CENTER): Payer: Medicare Other | Admitting: Gastroenterology

## 2011-02-10 ENCOUNTER — Encounter: Payer: Self-pay | Admitting: Gastroenterology

## 2011-02-10 VITALS — BP 188/84 | HR 85 | Temp 97.5°F | Resp 20 | Ht 63.0 in | Wt 107.0 lb

## 2011-02-10 DIAGNOSIS — Z86010 Personal history of colonic polyps: Secondary | ICD-10-CM

## 2011-02-10 DIAGNOSIS — K5730 Diverticulosis of large intestine without perforation or abscess without bleeding: Secondary | ICD-10-CM

## 2011-02-10 DIAGNOSIS — Z1211 Encounter for screening for malignant neoplasm of colon: Secondary | ICD-10-CM

## 2011-02-10 NOTE — Patient Instructions (Signed)
Pt given blue and green dc instruction sheets  And reviewed w care partner and he signed these papers.

## 2011-02-11 ENCOUNTER — Telehealth: Payer: Self-pay | Admitting: *Deleted

## 2011-02-11 NOTE — Telephone Encounter (Signed)

## 2011-02-17 NOTE — Procedures (Signed)
Summary: Colonoscopy  Patient: Judy Herrera Note: All result statuses are Final unless otherwise noted.  Tests: (1) Colonoscopy (COL)   COL Colonoscopy           DONE     [facility]     [address]     [place], [place]  [address]          COLONOSCOPY PROCEDURE REPORT          PATIENT:  Judy Herrera, Judy Herrera  MR#:  [id number]     BIRTHDATE:  [birth year], 79 yrs. old  GENDER:  female          ENDOSCOPIST:  Barbette Hair. Arlyce Dice, MD     Referred by:          PROCEDURE DATE:  02/10/2011     PROCEDURE:  Diagnostic Colonoscopy     ASA CLASS:  Class II     INDICATIONS:  1) screening  2) history of pre-cancerous     (adenomatous) colon polyps          MEDICATIONS:   Fentanyl 50 mcg IV, Versed 4 mg IV          DESCRIPTION OF PROCEDURE:   After the risks benefits and     alternatives of the procedure were thoroughly explained, informed     consent was obtained.  Digital rectal exam was performed and     revealed no abnormalities.   The LB 180AL [id number] endoscope was     introduced through the anus and advanced to the cecum, which was     identified by the ileocecal valve, without limitations.  The     quality of the prep was excellent, using MoviPrep.  The instrument     was then slowly withdrawn as the colon was fully examined.     <<PROCEDUREIMAGES>>          FINDINGS:  Mild diverticulosis was found in the sigmoid colon (see     image6).  Diverticula were found in the ascending colon.  This was     otherwise a normal examination of the colon (see image1, image2,     image5, and image7).   Retroflexed views in the rectum revealed no     abnormalities.    The time to cecum =  4.50  minutes. The scope     was then withdrawn (time =  6.0  min) from the patient and the     procedure completed.          COMPLICATIONS:  None          ENDOSCOPIC IMPRESSION:     1) Mild diverticulosis in the sigmoid colon     2) Diverticula in the ascending colon     3) Otherwise normal  examination     RECOMMENDATIONS:     1) Return to the care of your primary provider. GI follow up as     needed (age)          REPEAT EXAM:  No          ______________________________     Barbette Hair. Arlyce Dice, MD          CC:  Gordy Savers, MD          n.     Rosalie Doctor:   Barbette Hair. Unnamed Zeien at 02/10/2011 08:51 AM          Gardiner Ramus, [id number]  Note: An exclamation mark (!) indicates a result that was not dispersed into  the flowsheet. Document Creation Date: 02/10/2011 8:52 AM _______________________________________________________________________  (1) Order result status: Final Collection or observation date-time: 02/10/2011 08:39 Requested date-time:  Receipt date-time:  Reported date-time:  Referring Physician:   Ordering Physician: Melvia Heaps [phone]) Specimen Source:  Source: Launa Grill Order Number: [id number] Lab site:

## 2011-02-26 ENCOUNTER — Other Ambulatory Visit: Payer: Self-pay | Admitting: Dermatology

## 2011-03-20 ENCOUNTER — Encounter (INDEPENDENT_AMBULATORY_CARE_PROVIDER_SITE_OTHER): Payer: Self-pay | Admitting: Surgery

## 2011-03-31 NOTE — Consult Note (Signed)
NAME:  Judy Herrera, Judy Herrera NO.:  [id number]   MEDICAL RECORD NO.:  [id number]          PATIENT TYPE:  OUT   LOCATION:  GYN                          FACILITY:  [facility]   PHYSICIAN:  De Blanch, M.D.DATE OF BIRTH:  09-20-1931   DATE OF CONSULTATION:  DATE OF DISCHARGE:                                 CONSULTATION   CHIEF COMPLAINT:  Ovarian cancer.   INTERVAL HISTORY:  The patient returns today for continuing followup of  her ovarian cancer.  Since her last visit she has done well.  She  recently had a CA-125 on July 21, which was 8 units/mL.  Patient reports  she denies any GR or GU symptoms, has no pelvic vein pressure, vaginal  bleeding or discharge.  Her functional status is excellent.   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  She underwent initial surgical resection and staging in early 2004 and  then received 3 cycles of carboplatin and Taxol chemotherapy completed  in November 2004.  She has been followed since then with no evidence of  disease and normal CA-125 values.   PAST MEDICAL HISTORY:  1. Breast cancer 1994.  2. Hypertension.  3. Osteoporosis.  4. Cardiomyopathy (resolved).  5. Upper arm edema.   PAST SURGICAL HISTORY:  1. Open reduction ankle fracture.  2. Modified radical mastectomy.  3. Laparoscopic cholecystectomy.  4. Cataract surgery.  5. Ovarian cancer staging including TAH BSO, pelvic and periaortic      lymphadenectomy and omentectomy.   CURRENT MEDICATIONS:  An antihypertensive.  The patient does not recall  its name.  A heart medication.  The patient does not know that name.  Zocor, multivitamins, stool softener, Fosamax and Toprol XL.   DRUG ALLERGIES:  None.   SOCIAL HISTORY:  The patient is married.  She comes accompanied by her  husband today.  She is a nonsmoker.   FAMILY HISTORY:  Negative for gynecologic, breast and colon cancers.   REASON FOR ADMISSION:  Ten point  comprehensive review of systems  negative except as noted above.   PHYSICAL EXAMINATION:  Weight 112 pounds.  GENERAL:  The patient is a healthy, elderly white female in no acute  distress.  HEENT:  Negative.  NECK:  Supple without thyromegaly.  There is no supraclavicular or  inguinal adenopathy.  ABDOMEN:  Soft, nontender, no mass, organomegaly, ascites or hernias are  noted.  PELVIC EXAM:  EG/BUS.  Vagina, bladder, urethra are normal.  Cervix and  uterus are surgically absent.  Adnexa without masses.  Rectovaginal exam  confirms.  EXTREMITIES:  Without edema or varicosities.   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  No evidence of  recurrent disease.  Normal CA-125 and normal physical exam.  The patient  will return to see Dr. Darrold Span in 6 months and return to see [place] in one  year.      De Blanch, M.D.  Electronically Signed     DC/MEDQ  D:  06/20/2008  T:  06/20/2008  Job:  [id number]   cc:  Telford Nab, R.N.  [address]  [place], [place] [address]   Lennis P. Darrold Span, M.D.  Fax: [phone]   Madolyn Frieze. Jens Som, MD, [facility]  [address]  [address]  [place]  [place] [address]   Gordy Savers, MD  [address] [place]  [place] [address]

## 2011-03-31 NOTE — Assessment & Plan Note (Signed)
[facility]                            CARDIOLOGY OFFICE NOTE   NAME:FRAZIERAlvino Chapel [facility]                       MRN:          [id number]  DATE:07/20/2007                            DOB:          [birth year]    Ms. Settle returns for followup today.  She is a pleasant female who  has a history of cardiomyopathy out of proportion with coronary disease  as well as coronary disease and hypertension.  Her most recent  ultrasound was performed on December 14, 2006.  Her ejection fraction is  48-50%.  There was mild aortic, mitral and tricuspid regurgitation.  Since I last saw her, she is doing well.  She denies any dyspnea on  exertion, orthopnea, PND, pedal edema, palpitations, presyncope,  syncope, or chest pain.   Her medications include Fosamax, Ultram, aspirin 81 mg p.o. daily,  Diovan 160 mg p.o. b.i.d., vitamin D, calcium, Zocor 40 mg p.o. daily,  Coreg 25 mg p.o. b.i.d., stool softener, multivitamins, clonidine 0.1 mg  p.o. daily, HCTZ 12.5 mg p.o. daily, glucosamine.   PHYSICAL EXAMINATION:  Blood pressure 105/67, pulse 63.  She weighs 115  pounds.  HEENT:  Normal.  NECK:  Supple.  CHEST:  Clear.  CARDIOVASCULAR:  Regular rate and rhythm.  ABDOMEN:  Benign.  EXTREMITIES:  No edema.   Her electrocardiogram today shows a sinus rhythm at a rate of 65.  There  is a left bundle branch block, and there are occasional PVCs noted.   DIAGNOSES:  1. History of coronary disease:  She will continue on her aspirin,      statin, beta blocker, and ARB.  2. Cardiomyopathy out of proportion to coronary disease:  Her most      recent echocardiogram showed mildly reduced left ventricular      function.  She will continue on her ARB, beta blocker, and statin.  3. Hypertension:  The blood pressure is well controlled on her present      medications.  4. History of breast cancer, status post chemotherapy, radiation and      mastectomy.  5. History of lymphedema in  the left upper extremity.  6. History of ovarian cancer, status post hysterectomy and      chemotherapy.  7. Hyperlipidemia:  We will check lipids and liver and adjust her      Zocor as indicated with a goal      LDL of less than 70.  I will also check a BMET, given her ACE      inhibitor use.  I will see her back in one year.     Madolyn Frieze Jens Som, MD, [place]  Electronically Signed    BSC/MedQ  DD: 07/20/2007  DT: 07/20/2007  Job #: [id number]   cc:   Gordy Savers, MD

## 2011-03-31 NOTE — Assessment & Plan Note (Signed)
[facility]                            CARDIOLOGY OFFICE NOTE   NAME:FRAZIERAlvino Herrera [facility]                       MRN:          [id number]  DATE:12/05/2008                            DOB:          09/14/1931    Ms. Judy Herrera is a very pleasant 77 year old female with a history of  cardiomyopathy out of proportion to coronary artery disease.  She did  have a cardiac catheterization performed on August 18, 2005.  At that  time, she had a 60% OM, but otherwise nonobstructive coronary artery  disease.  Ejection fraction was 45%.  The last echocardiogram in January  2008 showed an ejection fraction of 40-50%.  There was mild aortic  insufficiency, mitral regurgitation, and tricuspid insufficiency.  Since  I last saw her, she is doing well symptomatically.  She has dyspnea with  more extreme activities but not with routine activities.  There is no  orthopnea, PND, pedal edema, palpitations, presyncope, syncope, or chest  pain.   Her medications at present include Fosamax, aspirin 81 mg p.o. daily,  vitamin D, calcium, Zocor 40 mg p.o. daily, Coreg 25 mg p.o. b.i.d.,  multivitamin, hydrochlorothiazide 12.5 mg p.o. daily, lisinopril 20 mg  p.o. daily.   Physical exam today shows a blood pressure of 108/61 and pulse is 70.  She weighs 110 pounds.  HEENT is normal.  Neck is supple.  Chest is  clear.  Cardiovascular exam reveals regular rate.  Abdominal exam shows  no tenderness.  Extremities show no edema.   Her electrocardiogram shows sinus rhythm with left bundle-branch block.  The left bundle is old compared to previous.   DIAGNOSES:  1. Cardiomyopathy out of proportion to coronary artery disease - she      will continue on her beta-blocker, ACE inhibitor.  We will plan to      repeat her echocardiogram to reassess her left ventricular      function.  2. History of coronary artery disease - she will continue on her      aspirin, beta-blocker, angiotensin  receptor blocker, and statins.      Note, we will check a BMET, follow potassium and renal function as      well as lipids and liver.  3. Hypertension - her blood pressure is adequately controlled on her      present medications.  4. History of breast cancer, status post chemotherapy, radiation, and      mastectomy.  5. History of lymphedema in the left upper extremity.  6. History of ovarian cancer status post hysterectomy and      chemotherapy.  7. Hyperlipidemia - she will continue on her statin.  We will check      lipids and liver as described above.   We will see her back in 12 months.  Note, she did state that she had an  MRI approximately 1-1/2 years ago that Dr. Darrold Span mentioned question of  an abnormality in her heart.  We will have it forwarded to [place] for our  records.      Judy Herrera Judy Som, MD, [facility]  Electronically  Signed    BSC/MedQ  DD: 12/05/2008  DT: 12/06/2008  Job #: [id number]   cc:   Judy Savers, MD

## 2011-03-31 NOTE — Consult Note (Signed)
NAME:  Judy Herrera, Judy Herrera NO.:  [id number]   MEDICAL RECORD NO.:  [id number]          PATIENT TYPE:  OUT   LOCATION:  GYN                          FACILITY:  [facility]   PHYSICIAN:  De Blanch, M.D.DATE OF BIRTH:  10-May-1931   DATE OF CONSULTATION:  05/10/2009  DATE OF DISCHARGE:                                 CONSULTATION   CHIEF COMPLAINT:  Clear cell carcinoma of the ovary.   INTERVAL HISTORY:  The patient presents today for annual checkup.  She  saw Dr. Darrold Span approximately six months ago.  Since her last visit, she  has done well.  She denies any GI/GU symptoms, has no pelvic pain,  pressure, vaginal bleeding or discharge, and her functional status  remains very good.   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  Postoperatively, she received three cycles  of carboplatin and Taxol chemotherapy completed in November 2004. We  followed her since that time with no evidence of recurrent disease.   PAST MEDICAL HISTORY/MEDICAL ILLNESSES:  1. Breast cancer in 1994.  2. Hypertension.  3. Osteoporosis.  4. Cardiomyopathy, resolved.  5. Upper arm edema.   PAST SURGICAL HISTORY:  1. Open reduction of an ankle fracture.  2. Modified radical mastectomy,  3. Laparoscopic cholecystectomy.  4. Cataract surgery.  5. Ovarian cancer staging including TAH/BSO, pelvic and periaortic      lymphadenopathy and omentectomy.   CURRENT MEDICATIONS:  Zocor, multivitamins, stool softener, Fosamax,  Toprol XL.   ALLERGIES:  None.   SOCIAL HISTORY:  The patient is married.  She comes accompanied by her  husband.  She is a nonsmoker and is retired.   FAMILY HISTORY:  Negative for gynecologic, breast or colon cancer.   REVIEW OF SYSTEMS:  A 10-point comprehensive review of systems negative  except as noted above.   PHYSICAL EXAMINATION:  VITAL SIGNS:  Weight 109 pounds, blood pressure  128/72.  GENERAL:   The patient is a healthy white female in no acute distress.  HEENT:  Negative.  NECK:  Supple without thyromegaly.  No supraclavicular or inguinal  adenopathy.  ABDOMEN:  Soft, nontender.  No mass, organomegaly, ascites or hernia  noted.  PELVIC:  EG/BUS, vagina, bladder, urethra are normal.  Cervix and uterus  are surgically absent.  Adnexa without masses.  Rectovaginal exam  confirms.   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  No evidence of  recurrent disease.  A CA-125 was obtained on June 8 which is 8 units/ml  and remains in the very normal range.   PLAN:  Patient to return to see Dr. Darrold Span in six months, return to see  [place] in one year.  We will continue to monitor her CA-125 values.      De Blanch, M.D.  Electronically Signed     DC/MEDQ  D:  05/10/2009  T:  05/10/2009  Job:  [id number]   cc:   Telford Nab, R.N.  [address]  [place], [place] [address]   Lennis P. Darrold Span,  M.D.  Fax: [phone]   Madolyn Frieze. Jens Som, MD, [facility]  [address]  [address]  [place]  [place] [address]   Gordy Savers, MD  [address] [place]  [place] [address]

## 2011-03-31 NOTE — Consult Note (Signed)
NAME:  Judy Herrera, Judy Herrera NO.:  [id number]   MEDICAL RECORD NO.:  [id number]          PATIENT TYPE:  OUT   LOCATION:  GYN                          FACILITY:  [facility]   PHYSICIAN:  De Blanch, M.D.DATE OF BIRTH:  [birth year]   DATE OF CONSULTATION:  05/24/2007  DATE OF DISCHARGE:  05/24/2007                                 CONSULTATION   GYN ONCOLOGY CLINIC.   CHIEF COMPLAINT:  Ovarian cancer.   INTERVAL HISTORY:  The patient returns today for continuing followup.  She saw Dr. Jama Flavors approximately 6 months ago.  A CA-125 on July  1 was 10 U/mL, similar to her values over the last 3 years.   In the interval, she denies any GI or GU symptoms, has no pelvic pain,  pressure, vaginal bleeding or discharge.  Functional status is  excellent.   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   PAST MEDICAL HISTORY/MEDICAL ILLNESSES:  1. Breast cancer in 1994, treated with surgery, radiation therapy and      chemotherapy.  2. Hypertension.  3. Osteoporosis.  4. Cardiomyopathy (resolved).  5. Upper arm edema.   PAST SURGICAL HISTORY:  1. Open reduction of ankle fracture.  2. Modified radical mastectomy.  3. Laparoscopic cholecystectomy.  4. Cataract surgery.  5. Ovarian cancer staging including TAH, BSO, pelvic and periaortic      lymphadenectomy and omentectomy.   CURRENT MEDICATIONS:  1. Diovan.  2. Zocor.  3. Multivitamins.  4. Stool softener.  5. Fosamax.  6. Toprol-XL.   DRUG ALLERGIES:  None.   SOCIAL HISTORY:  The patient is married, nonsmoker and retired.   FAMILY HISTORY:  Negative for gynecologic, breast or colon cancer.   REVIEW OF SYSTEMS:  A 10-point comprehensive review of systems negative  except as noted above.   PHYSICAL EXAM:  Weight 125 pounds, blood pressure  130/70, pulse 80,  respiratory rate 20.  GENERAL:  The patient is a healthy, elderly white female in no acute  distress.  HEENT:  Negative.  NECK:  Supple without thyromegaly.  There is no supraclavicular or  inguinal adenopathy.  ABDOMEN:  Soft, nontender, no mass, organomegaly, ascites or hernias are  noted.  PELVIC EXAM:  EG/BUS, vagina, bladder, urethra are normal but atrophic.  CERVIX AND UTERUS:  Surgically absent.  ADNEXA:  Without masses.  RECTOVAGINAL EXAM:  Confirms.  LOWER EXTREMITIES:  Without edema or varicosities.   The patient's laboratory work is reviewed including a breast MRI  obtained on April 22, 2007.   IMPRESSION:  Stage 1A clear cell carcinoma of the ovary 2004, no  evidence of recurrent disease.   PLAN:  The patient will see Dr. Darrold Span in 6 months and return to see [place]  in 1 year.      De Blanch, M.D.  Electronically Signed     DC/MEDQ  D:  05/26/2007  T:  05/26/2007  Job:  [id number]   cc:  Lennis P. Darrold Span, M.D.  Fax: [phone]   Rudy Jew. Ashley Royalty, M.D.  Fax: [phone]   Gordy Savers, MD  [address] [place]  [place] [address]   Telford Nab, R.N.  [address]  [place], [place] [address]

## 2011-04-03 NOTE — Op Note (Signed)
NAME:  Judy Herrera, Judy Herrera NO.:  [id number]   MEDICAL RECORD NO.:  [id number]          PATIENT TYPE:  AMB   LOCATION:  DFTL                         FACILITY:  [facility]   PHYSICIAN:  Salvadore Farber, M.D. LHCDATE OF BIRTH:  Apr 11, 1931   DATE OF PROCEDURE:  02/12/2006  DATE OF DISCHARGE:                                 OPERATIVE REPORT   PROCEDURE:  Left arm venography.   ATTENDING:  Salvadore Farber, M.D.   INDICATION:  Ms. Pontius is a 77 year old woman, status post left mastectomy  and axillary lymph node dissection in 1994 for breast cancer.  Approximately  1 month ago, she had a cellulitis of her left arm.  Symptoms largely  resolved with antibiotics.  However, she has had persistent swelling of the  left forearm and severe discomfort of the left forearm.  This had never been  the case after the mastectomy.  She had a venous duplex study yesterday,  which was nondiagnostic.  She therefore presents for venography to exclude  venous thrombosis as the etiology of her symptoms.   PROCEDURE TECHNIQUE:  Informed consent was obtained.  An IV was placed in  the left wrist.  Venography was performed by hand injection of contrast via  the IV.  Digital subtraction images were obtained from the level of the  wrist into the mid-thorax.  The patient tolerated the procedure well.   COMPLICATIONS:  None.   FINDINGS:  Widely patent left arm venous system with no thrombus.      Salvadore Farber, M.D. [facility]  Electronically Signed     WED/MEDQ  D:  02/12/2006  T:  02/13/2006  Job:  [id number]   cc:   Gordy Savers, M.D. [facility]  [address] [place]  [place] [address]
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
## 2011-04-03 NOTE — Assessment & Plan Note (Signed)
[facility]                            CARDIOLOGY OFFICE NOTE   NAME:Judy Herrera                       MRN:          [id number]  DATE:11/24/2006                            DOB:          12/12/1930    Judy Herrera is a very pleasant female who has a history of nonischemic  cardiomyopathy as well as coronary disease.  Since I last saw her, she  denies any increased dyspnea, chest pain, palpitations, or syncope.  However, she has noted that her blood pressure is mildly elevated with a  systolic in the 140-150 range.   PRESENT MEDICATIONS:  1. Include Fosamax.  2. Ultram.  3. Aspirin 81 mg p.o. daily.  4. Diovan 160 mg p.o. b.i.d.  5. Vitamin D.  6. Calcium.  7. Zocor 40 mg p.o. nightly.  8. Coreg 25 mg p.o. b.i.d.  9. Stool softener.  10.Multivitamin.  11.Clonidine as needed.   PHYSICAL EXAM:  Blood pressure 146/76 and her pulse is 70.  She weighs  121 pounds.  NECK:  Supple.  CHEST:  Clear.  CARDIOVASCULAR:  Regular rate and rhythm.  EXTREMITIES:  No edema.   ELECTROCARDIOGRAM:  Sinus rhythm with a left bundle branch block, which  is old.   DIAGNOSES:  1. Coronary artery disease.  2. Cardiomyopathy out of proportion to coronary artery disease.  3. Hypertension.  4. History of breast cancer status post chemotherapy and radiation, as      well as mastectomy.  5. History of lymphedema in the left upper extremity.  6. History of ovarian cancer status post hysterectomy and      chemotherapy.   PLAN:  Judy Herrera is doing well form a symptomatic standpoint.  However, her blood pressure is mildly elevated.  We will add  hydrochlorothiazide 12.5 mg p.o. daily.  We will have her return in 1  week and we will check a BMET as well as lipids and liver at that time.  We will also plan to  repeat her echocardiogram to re-quantify her LV function.  I will  otherwise see her back in 8 weeks to make sure that her blood pressure  is  controlled.     Judy Herrera Judy Som, MD, [facility]  Electronically Signed    BSC/MedQ  DD: 11/24/2006  DT: 11/24/2006  Job #: [id number]   cc:   Gordy Savers, MD

## 2011-04-03 NOTE — Assessment & Plan Note (Signed)
[facility]                            CARDIOLOGY OFFICE NOTE   NAME:FRAZIERAlvino Chapel [facility]                       MRN:          [id number]  DATE:02/09/2007                            DOB:          19-Sep-1931    Ms. Adamek returns for followup today.  She has a history of coronary  disease with cardiomyopathy out of proportion.  Since I last saw her she  denies any dyspnea, chest pain, palpitations, or syncope.  She does  occasionally have dyspnea with more extreme activities.   MEDICATIONS:  1. Fosamax 70 mg every week.  2. Ultram.  3. Aspirin 81 mg p.o. daily.  4. Diovan 160 mg p.o. b.i.d.  5. Vitamin D.  6. Calcium.  7. Zocor 40 mg p.o. nightly.  8. Coreg 25 mg p.o. b.i.d.  9. Stool softener.  10.Multivitamin.  11.Clonidine as needed.  12.Hydrochlorothiazide 12.5 mg p.o. daily.  13.Glucosamine.   PHYSICAL EXAMINATION:  Shows a blood pressure of 113/69, her pulse is  75.  NECK:  Supple.  CHEST:  Clear.  CARDIOVASCULAR:  Regular rate and rhythm.  EXTREMITIES:  No edema.   DIAGNOSIS:  1. Coronary artery disease.  2. Cardiomyopathy out of proportion of coronary disease.  3. Hypertension.  4. History of breast cancer, status post chemotherapy and radiation,      as well as mastectomy.  5. History of lymphedema in the left upper extremity.  6. History of ovarian cancer, status post hysterectomy and      chemotherapy.   PLAN:  Ms. Copenhaver is doing well from a symptomatic standpoint.  We will  continue with her present mediations.  I will see her back in 6 months  and we will most likely need to repeat her lipids and liver as well as a  her BMET at that time.     Madolyn Frieze Jens Som, MD, [facility]  Electronically Signed    BSC/MedQ  DD: 02/09/2007  DT: 02/09/2007  Job #: [id number]   cc:   Gordy Savers, MD
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
## 2011-04-03 NOTE — Consult Note (Signed)
NAME:  Judy Herrera, TIMMINS NO.:  [id number]   MEDICAL RECORD NO.:  [id number]          PATIENT TYPE:  OUT   LOCATION:  GYN                          FACILITY:  [facility]   PHYSICIAN:  De Blanch, M.D.DATE OF BIRTH:  [birth year]   DATE OF CONSULTATION:  06/01/2006  DATE OF DISCHARGE:                                   CONSULTATION   CHIEF COMPLAINT:  Ovarian cancer.   INTERVAL HISTORY:  The patient presents today for continuing follow-up of  ovarian cancer.  Since her last visit she has done well.  She denies any GI  or GU symptoms.  She has had no pelvic pain or pressure, vaginal bleeding or  discharge.  She had a CA-125 on July 12th that was 10 units per mL   HISTORY OF PRESENT ILLNESS:  The patient has a stage IA clear cell carcinoma  of the ovary undergoing initial surgery and staging, followed by three  cycles of carboplatin and Taxol chemotherapy which were completed in  November 2004.  She had been followed with no evidence of recurrent disease.   PAST MEDICAL HISTORY:  Medical illnesses:  1.  Breast cancer in 1994 treated with surgery, radiation therapy, and      chemotherapy.  2.  Hypertension.  3.  Osteoporosis.  4.  Possible cardiomyopathy.  5.  New onset left upper arm lymphedema.   PAST SURGICAL HISTORY:  1.  Open reduction of her ankle.  2.  Modified radical mastectomy.  3.  Laparoscopic cholecystectomy.  4.  Cataract surgery.  5.  Ovarian cancer surgery including staging and debulking.   CURRENT MEDICATIONS:  Diovan, Zocor, multivitamin, stool softener, Fosamax,  Toprol-XL.   DRUG ALLERGIES:  None.   SOCIAL HISTORY:  The patient is married.  She is a nonsmoker.   FAMILY HISTORY:  Negative for gynecologic, breast, or colon cancer.   REVIEW OF SYSTEMS:  A 10-point comprehensive review of systems is negative  except as noted above.   PHYSICAL EXAMINATION:  VITAL SIGNS:  Weight is 121 pounds, blood pressure  140/80.  GENERAL:  The  patient is a healthy, elderly white female in no acute  distress.  HEENT:  Negative.  NECK:  Supple without thyromegaly.  There is no supraclavicular or inguinal  adenopathy.  ABDOMEN:  Soft, nontender.  No masses, organomegaly, ascites, or hernias  noted.  PELVIC EXAM:  EGBUS, vagina, bladder, and urethra are normal, but atrophic.  The uterus and cervix are surgically absent.  Adnexa without masses.  Rectovaginal exam confirms.   IMPRESSION:  Stage IA clear cell carcinoma of the ovary in 2004.  No  evidence of recurrent disease.   PLAN:  The patient will return to see Dr. Darrold Span in six months and return  to see [place] in one year. We will continue to monitor CA-125 values at each  visit.      De Blanch, M.D.  Electronically Signed     DC/MEDQ  D:  06/01/2006  T:  06/02/2006  Job:  [id number]   cc:   Lennis P. Darrold Span, M.D.  Fax: [phone]  Telford Nab, R.N.  [address]  [place], [place] [address]   Rudy Jew. Ashley Royalty, M.D.  Fax: [phone]   Gordy Savers, M.D. [facility]  [address] [place]  [place] [address]

## 2011-04-03 NOTE — Consult Note (Signed)
NAME:  Judy Herrera, Judy Herrera [facility]                          ACCOUNT NO.:  [id number]   MEDICAL RECORD NO.:  [id number]                   PATIENT TYPE:  OUT   LOCATION:  GYN                                  FACILITY:  [facility]   PHYSICIAN:  De Blanch, M.D.         DATE OF BIRTH:  1931-09-04   DATE OF CONSULTATION:  04/02/2004  DATE OF DISCHARGE:                                   CONSULTATION   REASON FOR CONSULTATION:  A 77 year old white female seen in continuing  follow up of ovarian cancer.   INTERVAL HISTORY:  Since her last visit, the patient has done well.  She  denies any GI or GU symptoms, has no pelvic pain, pressure, vaginal  bleeding, or discharge.  Her functional status had returned to normal.   HISTORY OF PRESENT ILLNESS:  The patient was found to have a stage IA clear  cell carcinoma of the ovary undergoing initial surgical staging in September  2004.  She subsequently received 3 cycles of carboplatin and Taxol  chemotherapy under the direction of Dr. Jama Flavors the last being  administered in November 2004.   PAST MEDICAL HISTORY:  1. Breast cancer in 1994.  2. Hypertension.   PAST SURGICAL HISTORY:  1. Open reduction of ankle and left shoulder prosthesis.  2. Modified radical mastectomy.  3. Laparoscopic cholecystectomy.  4. Cataract surgery.  5. Ovarian cancer resection and staging.   CURRENT MEDICATIONS:  Diovan, stool softener, multivitamin.   DRUG ALLERGIES:  None.   SOCIAL HISTORY:  The patient is married.  She does not smoke.  She is  retired.   FAMILY HISTORY:  Negative for gynecologic, breast, or colon cancers.   REVIEW OF SYSTEMS:  Negative except as noted above.   PHYSICAL EXAMINATION:  VITAL SIGNS:  Weight 116 pounds, blood pressure  150/74.  GENERAL:  The patient is a healthy, elderly white female in no acute  distress.  HEENT:  Negative.  NECK:  Supple without thyromegaly.  LYMPHATICS:  There is no supraclavicular or inguinal  adenopathy.  ABDOMEN:  Soft, nontender.  No masses, organomegaly, ascites, or hernias are  noted.  A midline incision is well healed.  PELVIC:  EGBUS, vagina, bladder, urethra are normal but atrophic.  Bimanual  and rectovaginal exam reveals no masses, induration, or nodularity.   IMPRESSION:  Stage IA clear cell carcinoma of the ovary status post complete  resection, staging, and 3 cycles of carboplatin and Taxol chemotherapy.   The patient had a CA-125 obtained on May 17th which was 13.4 units per mL.   PLAN:  The patient will return to see Dr. Jama Flavors in 3 months and  return to see [place] in 6 months.  De Blanch, M.D.    DC/MEDQ  D:  04/02/2004  T:  04/02/2004  Job:  [id number]   cc:   Lennis P. Darrold Span, M.D.  [address]  [place]  [place] [address]  Fax: [phone]   Telford Nab, R.N.  [phone]  [place], [place] [address]   Rudy Jew. Ashley Royalty, M.D.  [address]  [place], [place] [address]  Fax: [phone]

## 2011-04-03 NOTE — Discharge Summary (Signed)
NAME:  Judy, [facility]                          ACCOUNT NO.:  [id number]   MEDICAL RECORD NO.:  [id number]                   PATIENT TYPE:  INP   LOCATION:  0467                                 FACILITY:  [facility]   PHYSICIAN:  Rudy Jew. Ashley Royalty, M.D.             DATE OF BIRTH:  1931/04/01   DATE OF ADMISSION:  07/17/2003  DATE OF DISCHARGE:  07/20/2003                                 DISCHARGE SUMMARY   DISCHARGE DIAGNOSES:  1. Ovarian clear cell carcinoma.  2. Intramural leiomyoma.  3. Hypertension.  4. History of breast cancer.   OPERATIONS AND SPECIAL PROCEDURES:  Exploratory laparotomy with total  abdominal hysterectomy, bilateral salpingo-oophorectomy, and staging  procedure.   CONSULTATIONS:  Surgery and care was rendered by consultant De Blanch, M.D.   DISCHARGE MEDICATIONS:  Percocet, Chromagen.   HISTORY AND PHYSICAL:  This is a 77 year old patient with one-month history  of right hip pain refractory to non-narcotic agents.  Plain film revealed a  pelvic mass and MRI was performed subsequently and revealed a complex 9+ cm  right adnexal mass.  The patient was subsequently referred to me for further  evaluation and therapy.  I consulted Dr. Serita Kyle and he agreed  that the patient needed to have an exploratory laparotomy with TAH/BSO and  potential staging procedure.  For the remainder of the history and physical,  please see chart.   HOSPITAL COURSE:  The patient was admitted to [facility].  On  July 17, 2003 she was taken to the operating room and underwent total  abdominal hysterectomy and staging procedure for ovarian carcinoma.  The  patient's postoperative course was essentially benign.  She was discharged  July 20, 2003 afebrile and in satisfactory condition.   ACCESSORY CLINICAL FINDINGS:  Hemoglobin and hematocrit on admission was  12.3 and 35.1 respectively.  Repeat values obtained July 18, 2003 were  9.3 and 26.4  respectively.  Final values obtained on July 18, 2003 were  9.7 and 26.9 respectively.  Comprehensive metabolic panel revealed sodium  initially 129, then 134.  Other values were essentially within normal  limits.  Pathology:  The pathology revealed an ovarian clear cell carcinoma  with tumor confined to the ovary.  Uterus and cervix revealed a small  leiomyoma only.  All lymph node biopsies and peritoneal biopsies were  negative.  The Pap smear from the diaphragm was negative as well.  The  patient was staged as a stage 1.   Due to the aggressive nature of clear cell carcinoma I believe Dr. Kemper Durie-  Pearson's plan is for her to return for adjuvant chemotherapy.  At any rate  she is to return next week to the office to have the remainder of her clips  removed and, of course, will follow up with Dr. Nelwyn Salisbury office as  well.  Appropriate discharge instructions were given.  James A. Ashley Royalty, M.D.    JAM/MEDQ  D:  08/08/2003  T:  08/08/2003  Job:  [id number]

## 2011-04-03 NOTE — Consult Note (Signed)
NAME:  Judy Herrera, NOBILE NO.:  [id number]   MEDICAL RECORD NO.:  [id number]          PATIENT TYPE:  OUT   LOCATION:  GYN                          FACILITY:  [facility]   PHYSICIAN:  De Blanch, M.D.DATE OF BIRTH:  Mar 17, 1931   DATE OF CONSULTATION:  02/20/2005  DATE OF DISCHARGE:                                   CONSULTATION   This 77 year old white female returns for continuing followup a stage Ia  clear cell carcinoma of the ovary.   INTERVAL HISTORY:  Since her last visit the patient has done well.  She  denies any GI or GU symptoms, has no pelvic pain, pressure, vaginal bleeding  or discharge.  Her function status is excellent.  She continues to go to  water aerobics 3 days a week.   HISTORY OF PRESENT ILLNESS:  The patient initially underwent laparotomy and  surgical staging with finding of the stage Ia clear cell carcinoma of the  ovary.  She then received 3 cycles of carboplatinin and Taxol chemotherapy  completed in November 2004.  She has subsequently been followed without any  evidence of recurrent disease.   PAST MEDICAL HISTORY:  Medical illness were breast cancer in 1994.  Hypertension.  Osteoporosis.  Surgical history for open reduction of the  ankle.  Modified radial mastectomy.  Laparoscopic cholecystectomy.  Cataract  surgery.  Oral cavity staging and debulking.   CURRENT MEDICATIONS:  Diovan.  Multivitamin.  Stool softener.  Lodine.  Fosamax.   DRUG ALLERGIES:  None.   SOCIAL HISTORY:  Reviewed and is unchanged.  She comes accompanied by her  husband who is about to have cataract surgery.   FAMILY HISTORY:  Reviewed and unchanged.  Negative for gynecologic, breast  or colon cancers.   REVIEW OF SYSTEMS:  Negative except as noted above.   PHYSICAL EXAMINATION:  VITAL SIGNS:  Weight 119 pounds, blood pressure  134/78.  GENERAL:  The patient is a healthy white female in no acute distress.  HEENT:  Negative.  NECK:  Supple without  thyromegaly.  There is no supraclavicular or inguinal  adenopathy.  ABDOMEN:  Soft and nontender.  No mass, organomegaly, ascites or hernia  noted.  PELVIC:  EG BUS, vaginal body and urethra are normal but atrophic.  No  lesions are noted.  Bimanual and rectovaginal exams reveal no masses,  induration or nodularity.  EXTREMITIES:  Lower extremities are without edema or varicosities.   IMPRESSION:  Stage Ia clear cell carcinoma of the ovary in 2004, no evidence  of recurrent disease.   PLAN:  The patient will see Dr. Darrold Span in 3 months and return to see [place] in  6 months.      DC/MEDQ  D:  02/20/2005  T:  02/20/2005  Job:  [id number]   cc:   Lennis P. Darrold Span, M.D.  [address] [facility]  [place]  [place] [address]  Fax: [phone]   Telford Nab, R.N.  [phone] [address]  [place], [place] [address]   Rudy Jew. Ashley Royalty, M.D.  [address]  [place] [address]  Fax: [phone]
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
## 2011-04-03 NOTE — Consult Note (Signed)
   NAME:  Judy Herrera, RIOLO [facility]                          ACCOUNT NO.:  [id number]   MEDICAL RECORD NO.:  [id number]                   PATIENT TYPE:  OUT   LOCATION:  GYN                                  FACILITY:  [facility]   PHYSICIAN:  De Blanch, M.D.         DATE OF BIRTH:  Aug 06, 1931   DATE OF CONSULTATION:  07/24/2003  DATE OF DISCHARGE:                                   CONSULTATION   HISTORY:  A 77 year old white female returns for initial postoperative check  and removal of staples. She underwent exploratory laparotomy and surgical  staging of a clear cell carcinoma of the ovary on July 17, 2003. Extensive  staging biopsies showed this to be a stage IA clear cell carcinoma of the  ovary. The patient has had an uncomplicated postoperative course. She has a  little bit of constipation but is taking laxatives for this.   PHYSICAL EXAMINATION:  ABDOMEN:  Soft, nontender. Midline incision is  healing well, staples removed, Steri-Strips applied.   IMPRESSION:  Stage 1A clear cell carcinoma of the ovary.   I had a lengthy discussion with the patient and her husband regarding  surgical findings and her pathology report. Given the poor prognosis of  clear cell carcinoma of the ovaries, we would recommend that she receive  adjuvant chemotherapy. I propose that she consider strongly participating in  GOG protocol #175 or a randomized trial comparing three cycles of  carboplatin and Taxol versus three cycles of carboplatin and Taxol plus  weekly, low dose Taxol consolidation therapy.   The patient is scheduled to see Lennis P. Darrold Span, M.D. next week and will  discuss this further with her. She has given informed consent and  information about clinical trials to read in the interval. We will plan on  seeing her back again for a six week postoperative checkup. m                                               De Blanch, M.D.    DC/MEDQ  D:  07/24/2003  T:  07/24/2003   Job:  [id number]   cc:   Fayrene Fearing A. Ashley Royalty, M.D.  [address]  [place], [place] [address]  Fax: [phone]   Lennis P. Darrold Span, M.D.  [address] [facility]  [place]  [place] [address]  Fax: [phone]   Telford Nab, R.N.  [phone] [address]  [place], [place] [address]

## 2011-04-03 NOTE — Consult Note (Signed)
NAME:  Judy Herrera, Judy Herrera NO.:  [id number]   MEDICAL RECORD NO.:  [id number]          PATIENT TYPE:  OUT   LOCATION:  GYN                          [facility]ACILITY[facility]  [facility]   PHYSICIAN:  De Blanch, M.D.DATE OF BIRTH:  Mar 25, 1931   DATE OF CONSULTATION:  08/28/2005  DATE OF DISCHARGE:                                   CONSULTATION   A 77 year old white female who returns for continuing follow-up of ovarian  cancer.   INTERVAL HISTORY:  Since her last visit, the patient has done well. She has  CA-125 value in September of 14.8 units/mL, which is stable compared other  values.   She denies any GI or GU symptoms; has no pelvic pain, pressure, vaginal  bleeding or discharge. Her functional status is good, although she has  recently had what sounds like palpitations and is undergoing a cardiac  workup. She tells me that it is thought that she may have some myocardial  injury, possibly associated with her prior use of Adriamycin for treating  her breast cancer. She has recently undergone a cardiac catheterization and  she had been placed on Toprol-XL.   HISTORY OF PRESENT ILLNESS:  The patient had stage Ia clear cell carcinoma  of the ovary, undergoing initial surgery and staging followed by three  cycles of carboplatin and Taxol chemotherapy completed in November 2004.   PAST MEDICAL HISTORY:  Medical illnesses:  Breast cancer 1994, hypertension,  osteoporosis, question cardiomyopathy verses arrhythmia.   PAST SURGICAL HISTORY:  Open reduction of the ankle, modified radical  mastectomy, laparoscopic cholecystectomy, cataract surgery, ovarian cancer  staging and debulking.   CURRENT MEDICATIONS:  Diovan, multivitamins, stool softener, Lodine,  Fosamax, and Toprol-XL.   DRUG ALLERGIES:  None.   SOCIAL HISTORY:  The patient is married. She is a nonsmoker.   FAMILY HISTORY:  Negative gynecologic, breast, or colon cancer.   REVIEW OF SYSTEMS:  A 10-point  comprehensive review of systems is performed  and is negative except for symptoms noted above.   PHYSICAL EXAMINATION:  VITAL SIGNS:  Weight 120 pounds, blood pressure  130/80, pulse 80.  GENERAL:  The patient is a healthy white female in no acute distress.  HEENT:  Negative.  NECK:  Supple without thyromegaly.  LYMPH:  There is no supraclavicular or inguinal adenopathy.  ABDOMEN:  Soft, nontender. No mass, organomegaly, ascites or hernias noted.  PELVIC:  EG/BUS, vagina, bladder, and urethra are normal but atrophic.  Bimanual and rectovaginal exam reveal no masses, induration or nodularity.  EXTREMITIES:  Without edema or varicosities. She does have an ecchymotic  area in her right groin from her recent catheterization.   IMPRESSION:  Stage Ia clear cell carcinoma of the ovary in 2004, no evidence  of recurrent disease.   PLAN:  The patient will see Dr. Darrold Span in January. Thereafter, I think we  can stretch her interval visits to 6 months and I will therefore plan on  seeing her back in July for continuing follow-up.      De Blanch, M.D.  Electronically Signed     DC/MEDQ  D:  08/28/2005  T:  08/28/2005  Job:  [id number]   cc:   Lennis P. Darrold Span, M.D.  Fax: [phone]   Telford Nab, R.N.  [address]  [place], [place] [address]   Rudy Jew. Ashley Royalty, M.D.  Fax: [phone]

## 2011-04-03 NOTE — Cardiovascular Report (Signed)
NAME:  Judy Herrera, Judy Herrera NO.:  [id number]   MEDICAL RECORD NO.:  [id number]          PATIENT TYPE:  OIB   LOCATION:  1965                         FACILITY:  [facility]   PHYSICIAN:  Rollene Rotunda, M.D.   DATE OF BIRTH:  November 04, 1931   DATE OF PROCEDURE:  08/18/2005  DATE OF DISCHARGE:                              CARDIAC CATHETERIZATION   PROCEDURE NOTE:  Left heart catheterization/coronary arteriography.   INDICATIONS:  [name] with cardiomyopathy and an EF of approximately 45%.  She has a left bundle branch block.  Stress perfusion study suggested  possible ischemia or scarring in the inferior, anteroseptal, and intraseptal  segments.   PROCEDURE NOTE:  Left heart catheterization was performed via the right  femoral artery. The artery was cannulated using an anterior wall puncture. A  #4 French arterial sheath was inserted via the modified Seldinger technique.  Preformed Judkins and a pigtail catheter were utilized. The patient  tolerated the procedure well and left the lab in stable condition.   RESULTS   HEMODYNAMICS:  LV 174/13, AO 179/90   CORONARIES:  1.  Left main was normal.  2.  The LAD had proximal, heavy calcification.  There was a long 30%      stenosis.  The LAD wrapped the apex.  There were mid-and-distal diffuse      25% lesions.  The first diagonal was large, there were luminal      irregularities.  3.  The circumflex had an ostial of 25% stenosis.  There was a large obtuse      marginal 1 with heavy ostial calcification in the 60% ostial lesion.  OM-      2 was normal sized and normal.  There was a small posterolateral which      was normal.  4.  The right coronary artery was large dominant vessel.  There was a PDA      that came off the acute marginal.  This was normal.  There was a small      posterolateral which was normal.   LEFT VENTRICULOGRAM:  The left ventriculogram was obtained in the RAO  projection. The EF was 45% with mild global  hypokinesis.   CONCLUSION:  Nonobstructive coronary disease with the most severe lesion  being the ostial obtuse marginal; however, this is unlikely to be causing  any symptoms and not related to her global hypokinesis.  It would be  difficult to fix percutaneously.  Therefore, this should be followed  symptomatically and with aggressive risk reduction.           ______________________________  Rollene Rotunda, M.D.     JH/MEDQ  D:  08/18/2005  T:  08/18/2005  Job:  [id number]   cc:   Gordy Savers, M.D. [facility]  [address] [place]  [place] [address]   Olga Millers, M.D. [facility]  [address]  [place]  [place] [address]   Lennis P. Darrold Span, M.D.  Fax: [phone]

## 2011-04-03 NOTE — Op Note (Signed)
NAME:  Judy Herrera, Judy Herrera [facility]                        ACCOUNT NO.:  [id number]   MEDICAL RECORD NO.:  [id number]                   PATIENT TYPE:  INP   LOCATION:  0012                                 FACILITY:  [facility]   PHYSICIAN:  De Blanch, M.D.         DATE OF BIRTH:  11/30/1930   DATE OF PROCEDURE:  07/17/2003  DATE OF DISCHARGE:                                 OPERATIVE REPORT   PREOPERATIVE DIAGNOSIS:  Complex pelvic mass.   POSTOPERATIVE DIAGNOSIS:  Ovarian cancer (stage pending final pathology  report).   OPERATION PERFORMED:  Exploratory laparotomy, total abdominal hysterectomy  and bilateral salpingo-oophorectomy, staging of ovarian cancer including  pelvic and periaortic lymphadenectomy, multiple peritoneal biopsies,  omentectomy, peritoneal washings, and diaphragm Pap smear.   SURGEON:  De Blanch, M.D.   ASSISTANT:  1. Rudy Jew. Ashley Royalty, M.D.  2. Telford Nab, R.N.   ANESTHESIA:  General with orotracheal tube.   ESTIMATED BLOOD LOSS:  .   OPERATIVE FINDINGS:  At exploratory laparotomy the upper abdomen including  the diaphragm, liver capsule, spleen, stomach, omentum, small and large  bowel were normal.  In the pelvis, there was an approximately 10 cm  multicystic and solid mass arising from the right ovary.  This was not  adherent. There was no evidence of ascites.  The left tube, ovary and uterus  appeared normal.  At the completion of the surgical procedure, there was no  gross evidence of disease.   DESCRIPTION OF PROCEDURE:  The patient was brought to the operating room and  after satisfactory attainment of general anesthesia, was placed in modified  lithotomy position in [place] stirrups.  The anterior abdominal wall, perineum  and vagina were prepped with Betadine.  A Foley catheter was placed and the  patient was draped.  The abdomen was entered through a midline incision  which extended above the umbilicus.  Peritoneal washings  were obtained and  sent for cytology.  The upper abdomen and then the pelvis were explored with  the above-noted findings.  The Bookwalter retractor was positioned and the  bowel was packed out of the pelvis.  The right pelvic peritoneum was incised  and the round ligament divided.  The external iliac vessels and ureter were  identified.  The ovarian vessels were skeletonized, clamped, cut free tied  and suture ligated.  The broad ligament beneath the meso salpinx was incised  and the tube and ovarian mass were lifted out of the pelvis.  The fallopian  tube and uterine ovarian anastomosis were doubly cross-clamped and divided.  The mass was handed off the operative field and submitted to pathology for  frozen section.  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  At this juncture, it  was deemed appropriate to proceed with complete surgical staging.   The left retroperitoneal space was opened identifying the retroperitoneal  vessels and ureter.  The ovarian vessels were skeletonized, clamped, cut,  free tied and suture ligated.  The bladder flap was advanced with sharp and  blunt dissection.  The rectovaginal septum was opened.  The uterine vessels  were skeletonized, clamped, cut and suture ligated in a stepwise fashion.  The paracervical and cardinal ligaments were clamped, cut and suture  ligated.  The vaginal angles were cross-clamped and divided, the vagina  transected from its connection to the cervix  The uterus, cervix, left tube  and ovary were handed off the operative field.  The vaginal angles were  transfixed with 0 Vicryl and the central portion of the vagina closed with  interrupted figure-of-eight sutures of 0 Vicryl which incorporated the  peritoneum of the posterior cul-de-sac.   Attention was turned to performing a pelvic lymphadenectomy.  Lymph nodes  from the external iliac artery and vein, hypogastric  artery and obturator  fossa were removed.  Hemostasis was achieved with cautery and Hemoclips.  Care was taken to avoid vascular injury and injury to the obturator nerve  which was exposed throughout the dissection.  Similar procedure was  performed on the opposite side of the pelvis.  Hot  packs were placed in the  pelvis and attention was turned to the upper abdomen.   The Bookwalter retractor was repositioned, the upper abdominal wall elevated  and the omentum brought into the operative field.  An infracolic omentectomy  was performed.  Vascular pedicles were created, cross-clamped with Kelly  clamps and then free tied.  The omentum was divided into a number of pieces  and submitted to pathology for permanent sectioning.  The small bowel was  packed to the right and the periaortic region was exposed.  Peritoneal  incision was made overlying the right common iliac artery and along the  aorta.  Care was taken to avoid injury to the inferior mesenteric artery  which was pushed to the left.  The right ureter was identified and mobilized  away from the vena cava and held behind the retractor on the right side.  Thereafter, the lymph nodes were removed from the common iliac artery and  vein, aorta and inferior vena cava. The dissection proceeded several  centimeters above the inferior mesenteric artery and up to the insertion of  the ovarian artery into the aorta.  Hemostasis was again achieved with  cautery and Hemoclips. This area was inspected and found to be hemostatic.  Peritoneal biopsies were obtained from the right and left pericolic gutters,  the right and left pelvic sidewalls, the peritoneum overlying the bladder  and the posterior cul-de-sac.  These were all submitted as separate  specimens for staging.   A sterile tongue blade was then used to take a Pap smear of the right diaphragm.  Having completed surgical staging, the pelvis and abdomen were  irrigated with saline.  All  the packs and retractors were removed and the  anterior abdominal wall was closed in layers, the first being a running mass  closure using #1 PDS.  The subcutaneous tissue was irrigated and the skin  closed with skin staples.  A dressing was applied.  The patient was awakened  from anesthesia and taken to the recovery room in satisfactory condition.  Sponge, needle and instrument counts were correct times two.  De Blanch, M.D.    DC/MEDQ  D:  07/17/2003  T:  07/17/2003  Job:  [id number]   cc:   Fayrene Fearing A. Ashley Royalty, M.D.  [address]  [place], [place] [address]  Fax: [phone]   Lennis P. Darrold Span, M.D.  [address] [facility]  [place]  [place] [address]  Fax: [phone]   Gordy Savers, M.D. [facility]   Telford Nab, R.N.  [address]  [place], [place] [address]

## 2011-04-03 NOTE — Consult Note (Signed)
NAME:  Judy Herrera, Judy Herrera NO.:  [id number]   MEDICAL RECORD NO.:  [id number]          PATIENT TYPE:  OUT   LOCATION:  GYN                          FACILITY:  [facility]   PHYSICIAN:  De Blanch, M.D.DATE OF BIRTH:  09/11/1931   DATE OF CONSULTATION:  09/17/2004  DATE OF DISCHARGE:                                   CONSULTATION   A 77 year old white female returns for continued followup of ovarian cancer.   INTERVAL HISTORY:  Since her last visit, the patient has done well. She has  had some pain in her back and has been undergoing water aerobic therapy.   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  After surgical staging, she received three  cycles of carboplatin, Taxol chemotherapy completed November 2004.   PAST MEDICAL HISTORY:  Medical illnesses:  Breast cancer in 1994,  hypertension, osteoporosis.   PAST SURGICAL HISTORY:  Open reduction of ankle and left shoulder  prosthesis, modified radical mastectomy, laparoscopic cholecystectomy,  cataract surgery, ovarian cancer resection and staging.   CURRENT MEDICATIONS:  1.  Diovan.  2.  Lodine.  3.  Fosamax.  4.  Multivitamins.   ALLERGIES:  None.   SOCIAL HISTORY:  Reviewed and unchanged.  She comes accompanied by her  husband today.   Family history and social history are reviewed and unchanged.   REVIEW OF SYSTEMS:  Negative except as noted above.   PHYSICAL EXAMINATION:  GENERAL:  The patient is a healthy white female in no  acute distress.  VITAL SIGNS:  Weight 115 pounds, blood pressure 158/84.  HEENT:  Negative.  NECK:  Supple without thyromegaly.  There is no supraclavicular or inguinal  adenopathy.  ABDOMEN:  Soft, nontender.  No masses, organomegaly, ascites or hernias are  noted.  PELVIC:  EG/BUS, vagina, bladder, urethra normal.  Bimanual rectovaginal  exam revealed no masses, induration, or modularity.   LABORATORY  DATA:  The patient's CA-125 on September 15, 2004 was 12.1  units/mL.   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  No evidence of recurrent disease.   PLAN:  The patient will return to see Dr. Darrold Span in three months. Return to  see [place] in six months.     Dani   DC/MEDQ  D:  09/17/2004  T:  09/17/2004  Job:  [id number]   cc:   Lennis P. Darrold Span, M.D.  [address] [facility]  [place]  [place] [address]  Fax: [phone]   Telford Nab, R.N.  [phone] [address]  [place], [place] [address]   Rudy Jew. Ashley Royalty, M.D.  [address]  [place], [place] [address]  Fax: [phone]

## 2011-04-03 NOTE — Consult Note (Signed)
NAME:  Judy Herrera, MORETTO [facility]                          ACCOUNT NO.:  [id number]   MEDICAL RECORD NO.:  [id number]                   PATIENT TYPE:  OUT   LOCATION:  GYN                                  FACILITY:  [facility]   PHYSICIAN:  De Blanch, M.D.         DATE OF BIRTH:  1931/08/20   DATE OF CONSULTATION:  11/21/2003  DATE OF DISCHARGE:                                   CONSULTATION   HISTORY OF PRESENT ILLNESS:  A 77 year old white female returns for  continuing follow-up of a stage IA clear cell carcinoma of the ovary.  She  has now received three cycles of carboplatin and Taxol chemotherapy under  the direction of Dr. Darrold Span, the last being administered October 16, 2003.  She had some difficulties after her first cycle of chemotherapy, but  tolerated the second and third cycle without any problems whatsoever.  Overall, her functional status is excellent.  She has no complaints.  She  specifically has no GI or GU symptoms.  Denies any pelvic pain, pressure,  vaginal bleeding, or discharge.   PAST MEDICAL HISTORY:  1. Breast cancer 1994.  2. Hypertension.   PAST SURGICAL HISTORY:  1. Open reduction of ankle and left shoulder prosthesis.  2. Modified radical mastectomy.  3. Laparoscopic cholecystectomy.  4. Cataract surgery.   CURRENT MEDICATIONS:  1. Diovan.  2. Baby aspirin.  3. Multivitamins.   ALLERGIES:  None.   SOCIAL HISTORY:  Reviewed and unchanged.  The patient comes accompanied by  her husband today.   FAMILY HISTORY:  Reviewed and unchanged.   REVIEW OF SYSTEMS:  Negative except as noted above.   PHYSICAL EXAMINATION:  VITAL SIGNS:  Weight 119 pounds, blood pressure  154/80.  GENERAL:  The patient is a healthy, elderly white female in no acute  distress.  HEENT:  Negative.  NECK:  Supple without thyromegaly.  LYMPH:  There is no supraclavicular or inguinal adenopathy.  ABDOMEN:  Soft, nontender.  No masses, organomegaly, ascites, or hernias are  noted.  PELVIC:  EGBUS, vagina, bladder, urethra are normal, but atrophic.  Bimanual  and rectovaginal examinations reveal no masses, induration, or nodularity.   IMPRESSION:  Stage IA clear cell carcinoma of the ovary status post surgical  staging, complete resection, and three cycles of carboplatin and Taxol  chemotherapy.  The patient is clinically free of disease.   PLAN:  CA-125 is obtained today.  The patient will return to see Dr. Darrold Span  as scheduled and return to see me three months thereafter.                                               De Blanch, M.D.    DC/MEDQ  D:  11/21/2003  T:  11/21/2003  Job:  [id number]   cc:   Ottie Glazier  Buzzy Han, M.D.  [address] [facility]  [place]  [place] [address]  Fax: [phone]   Telford Nab, R.N.  [phone]  [place], [place] [address]   Rudy Jew. Ashley Royalty, M.D.  [address]  [place], [place] [address]  Fax: [phone]

## 2011-04-03 NOTE — Op Note (Signed)
NAME:  Judy Herrera, Judy Herrera [facility]                          ACCOUNT NO.:  [id number]   MEDICAL RECORD NO.:  [id number]                   PATIENT TYPE:  AMB   LOCATION:  ENDO                                 FACILITY:  [facility]   PHYSICIAN:  Petra Kuba, M.D.                 DATE OF BIRTH:  04-04-1931   DATE OF PROCEDURE:  05/22/2004  DATE OF DISCHARGE:                                 OPERATIVE REPORT   PROCEDURE:  Colonoscopy.   INDICATIONS FOR PROCEDURE:  History of colon polyps due for repeat  screening.   Consent was signed after risks, benefits, methods, and options were  thoroughly discussed multiple times in the past.   MEDICINES USED:  Fentanyl 40, Versed 4.   DESCRIPTION OF PROCEDURE:  Rectal inspection was pertinent for small  external hemorrhoids. Digital exam was negative. The pediatric video  adjustable colonoscope was inserted and with rolling her on her back and  abdominal pressure easily able to be advanced to the cecum. No abnormality  was seen on insertion.  The cecum was identified by the appendiceal orifice  and the ileocecal valve. The scope was slowly withdrawn. The prep was  adequate. There was some liquid stool that required washing and suctioning  on slow withdrawal through the colon. No polyps, tumors, masses or  abnormalities were seen as we slowly withdrew back to the rectum.  Once back  in the rectum, anal rectal pullthrough and retroflexion confirmed some small  hemorrhoids.  The scope was reinserted a short ways up the left side of the  colon, air was suctioned, scope removed. The patient tolerated the procedure  well. There was no obvious or immediate complications.   ENDOSCOPIC DIAGNOSIS:  1. Internal and external hemorrhoids.  2. Otherwise within normal limits to the cecum.   PLAN:  Happy to see back p.r.n.  Return care to [facility] and [facility] Cunas for  the customary health care maintenance to include yearly rectals and guaiacs  and if doing well medically  consider repeat screening in five years.                                               Petra Kuba, M.D.    MEM/MEDQ  D:  05/22/2004  T:  05/22/2004  Job:  [id number]   cc:   Gordy Savers, M.D. [facility]   Lennis P. Darrold Span, M.D.  [address] [facility]  [place]  [place] [address]  Fax: [phone]

## 2011-04-24 ENCOUNTER — Encounter (HOSPITAL_BASED_OUTPATIENT_CLINIC_OR_DEPARTMENT_OTHER): Payer: Medicare Other | Admitting: Oncology

## 2011-04-24 ENCOUNTER — Other Ambulatory Visit: Payer: Self-pay | Admitting: Oncology

## 2011-04-24 DIAGNOSIS — D631 Anemia in chronic kidney disease: Secondary | ICD-10-CM

## 2011-04-24 DIAGNOSIS — Z853 Personal history of malignant neoplasm of breast: Secondary | ICD-10-CM

## 2011-04-24 DIAGNOSIS — N189 Chronic kidney disease, unspecified: Secondary | ICD-10-CM

## 2011-04-24 DIAGNOSIS — Z8543 Personal history of malignant neoplasm of ovary: Secondary | ICD-10-CM

## 2011-04-24 LAB — CA 125: CA 125: 7.1 U/mL (ref 0.0–30.2)

## 2011-04-27 ENCOUNTER — Other Ambulatory Visit: Payer: Self-pay | Admitting: Dermatology

## 2011-04-27 HISTORY — PX: SKIN BIOPSY: SHX1

## 2011-04-29 ENCOUNTER — Inpatient Hospital Stay (INDEPENDENT_AMBULATORY_CARE_PROVIDER_SITE_OTHER)
Admission: RE | Admit: 2011-04-29 | Discharge: 2011-04-29 | Disposition: A | Discharge status: Home / Self Care | Payer: Medicare Other | Source: Ambulatory Visit | Attending: Emergency Medicine | Admitting: Emergency Medicine

## 2011-04-29 ENCOUNTER — Encounter: Payer: Self-pay | Admitting: Emergency Medicine

## 2011-04-29 DIAGNOSIS — N390 Urinary tract infection, site not specified: Secondary | ICD-10-CM

## 2011-04-29 LAB — CONVERTED CEMR LAB
Specific Gravity, Urine: <1.005
Urobilinogen, UA: >=8
pH: 5

## 2011-05-01 ENCOUNTER — Telehealth (INDEPENDENT_AMBULATORY_CARE_PROVIDER_SITE_OTHER): Payer: Self-pay | Admitting: *Deleted

## 2011-05-08 ENCOUNTER — Ambulatory Visit: Payer: Medicare Other | Attending: Gynecology | Admitting: Gynecology

## 2011-05-08 DIAGNOSIS — Z79899 Other long term (current) drug therapy: Secondary | ICD-10-CM | POA: Insufficient documentation

## 2011-05-08 DIAGNOSIS — I10 Essential (primary) hypertension: Secondary | ICD-10-CM | POA: Insufficient documentation

## 2011-05-08 DIAGNOSIS — M810 Age-related osteoporosis without current pathological fracture: Secondary | ICD-10-CM | POA: Insufficient documentation

## 2011-05-08 DIAGNOSIS — Z9221 Personal history of antineoplastic chemotherapy: Secondary | ICD-10-CM | POA: Insufficient documentation

## 2011-05-08 DIAGNOSIS — Z9010 Acquired absence of unspecified breast and nipple: Secondary | ICD-10-CM | POA: Insufficient documentation

## 2011-05-08 DIAGNOSIS — Z9079 Acquired absence of other genital organ(s): Secondary | ICD-10-CM | POA: Insufficient documentation

## 2011-05-08 DIAGNOSIS — Z853 Personal history of malignant neoplasm of breast: Secondary | ICD-10-CM | POA: Insufficient documentation

## 2011-05-08 DIAGNOSIS — C569 Malignant neoplasm of unspecified ovary: Secondary | ICD-10-CM | POA: Insufficient documentation

## 2011-05-08 DIAGNOSIS — Z90710 Acquired absence of both cervix and uterus: Secondary | ICD-10-CM | POA: Insufficient documentation

## 2011-05-11 ENCOUNTER — Encounter: Payer: Self-pay | Admitting: Internal Medicine

## 2011-05-11 NOTE — Consult Note (Signed)
  NAME:  Judy Herrera, Judy Herrera NO.:  [id number]  MEDICAL RECORD NO.:  [id number]  LOCATION:  GYN                          FACILITY[facility]  [facility]  PHYSICIAN:  De Blanch, M.D.DATE OF BIRTH:  June 05, 1931  DATE OF CONSULTATION:  05/08/2011 DATE OF DISCHARGE:                                CONSULTATION   CHIEF COMPLAINT:  Ovarian cancer (clear cell).  INTERVAL HISTORY:  The patient returns today for continuing followup. She is now approximately 8 years since initial treatment of a stage IA clear cell carcinoma of the ovary.  She denies any GI or GU symptoms. She has no pelvic pain or pressure, vaginal bleeding or discharge.  She has recently been found to have a new skin cancer and is under treatment at [facility].  She saw Dr. Darrold Span approximately 6 months ago.  She has no complaints and her functional status is excellent.  HISTORY OF PRESENT ILLNESS:  Stage IA clear cell carcinoma of the ovary, initially diagnosed and undergoing surgical staging in 2004.Postoperatively, she received three cycles of carboplatin and Taxol, completed in November 2004 and has been followed since that time with no evidence of recurrent disease.  PAST MEDICAL HISTORY:  Breast cancer in 1994, hypertension, osteoporosis, cardiomyopathy (resolved), right upper arm edema.  PAST SURGICAL HISTORY:  Open reduction of ankle fracture, modified radical mastectomy, laparoscopic cholecystectomy, cataract surgery, ovarian cancer staging including TAH-BSO, pelvic and periaortic lymphadenectomy and omentectomy in 2004.  CURRENT MEDICATIONS:  Zocor, multivitamins, stool softener, Fosamax, and Toprol.  DRUG ALLERGIES:  None.  SOCIAL HISTORY:  The patient is widowed.  She does not smoke and is retired.  FAMILY HISTORY:  Negative for gynecologic, breast or colon cancer.  REVIEW OF SYSTEMS:  10-point comprehensive review of systems is negative except as noted above.  PHYSICAL  EXAMINATION:  VITAL SIGNS:  Weight 108 pounds, blood pressure 110/52, pulse 60, respiratory rate 18. GENERAL:  The patient is a healthy white female in no acute distress. HEENT:  Negative. NECK:  Supple without thyromegaly.  There is no supraclavicular or inguinal adenopathy. ABDOMEN:  Soft, nontender.  No mass, organomegaly, ascites, or hernias noted. PELVIC:  EGBUS, vagina, bladder, and urethra are normal.  Cervix and uterus are surgically absent.  Adnexa without masses.  Rectovaginal exam confirms. LOWER EXTREMITIES:  Without edema or varicosities.  IMPRESSION:  Stage IA clear cell carcinoma of the ovary in 2004, no evidence of recurrent disease.  PLAN:  CA-125 is pending.  The patient will return to see me in 1 year for continuing followup.     De Blanch, M.D.     DC/MEDQ  D:  05/08/2011  T:  05/08/2011  Job:  [id number]  cc:   Telford Nab, R.N. [address] [place], [place] [address]  Reece Packer, M.D. Fax: [phone]  Madolyn Frieze. Jens Som, MD, [facility] [address]  [address] [place] [address]  Gordy Savers, MD [address] [place] [address]  Electronically Signed by De Blanch M.D. on 05/11/2011 01:01:44 PM

## 2011-05-18 ENCOUNTER — Other Ambulatory Visit: Payer: Self-pay | Admitting: Oncology

## 2011-05-18 ENCOUNTER — Encounter (HOSPITAL_BASED_OUTPATIENT_CLINIC_OR_DEPARTMENT_OTHER): Payer: Medicare Other | Admitting: Oncology

## 2011-05-18 DIAGNOSIS — Z853 Personal history of malignant neoplasm of breast: Secondary | ICD-10-CM

## 2011-05-18 DIAGNOSIS — N039 Chronic nephritic syndrome with unspecified morphologic changes: Secondary | ICD-10-CM

## 2011-05-18 DIAGNOSIS — Z8543 Personal history of malignant neoplasm of ovary: Secondary | ICD-10-CM

## 2011-05-18 DIAGNOSIS — N189 Chronic kidney disease, unspecified: Secondary | ICD-10-CM

## 2011-05-18 DIAGNOSIS — D631 Anemia in chronic kidney disease: Secondary | ICD-10-CM

## 2011-05-18 LAB — COMPREHENSIVE METABOLIC PANEL
ALT: 16 U/L (ref 0–35)
AST: 19 U/L (ref 0–37)
Albumin: 4.1 g/dL (ref 3.5–5.2)
Alkaline Phosphatase: 46 U/L (ref 39–117)
BUN: 14 mg/dL (ref 6–23)
Calcium: 9 mg/dL (ref 8.4–10.5)
Chloride: 100 mEq/L (ref 96–112)
Potassium: 4.1 mEq/L (ref 3.5–5.3)
Sodium: 137 mEq/L (ref 135–145)
Total Protein: 6.1 g/dL (ref 6.0–8.3)

## 2011-05-18 LAB — CBC WITH DIFFERENTIAL/PLATELET
BASO%: 0.5 % (ref 0.0–2.0)
Basophils Absolute: 0 10*3/uL (ref 0.0–0.1)
EOS%: 2.5 % (ref 0.0–7.0)
HGB: 11.2 g/dL — ABNORMAL LOW (ref 11.6–15.9)
MCH: 31.7 pg (ref 25.1–34.0)
MCHC: 34.7 g/dL (ref 31.5–36.0)
MCV: 91.5 fL (ref 79.5–101.0)
MONO%: 8.3 % (ref 0.0–14.0)
RBC: 3.52 10*6/uL — ABNORMAL LOW (ref 3.70–5.45)
RDW: 13.8 % (ref 11.2–14.5)
lymph#: 0.8 10*3/uL — ABNORMAL LOW (ref 0.9–3.3)

## 2011-06-05 ENCOUNTER — Ambulatory Visit: Payer: Self-pay | Admitting: Internal Medicine

## 2011-07-22 ENCOUNTER — Inpatient Hospital Stay (INDEPENDENT_AMBULATORY_CARE_PROVIDER_SITE_OTHER)
Admission: RE | Admit: 2011-07-22 | Discharge: 2011-07-22 | Disposition: A | Discharge status: Home / Self Care | Payer: Medicare Other | Source: Ambulatory Visit | Attending: Emergency Medicine | Admitting: Emergency Medicine

## 2011-07-22 ENCOUNTER — Encounter: Payer: Self-pay | Admitting: Emergency Medicine

## 2011-07-22 DIAGNOSIS — N390 Urinary tract infection, site not specified: Secondary | ICD-10-CM

## 2011-07-22 LAB — CONVERTED CEMR LAB
Glucose, Urine, Semiquant: NEGATIVE
Nitrite: NEGATIVE
Protein, U semiquant: NEGATIVE
Specific Gravity, Urine: 1.025

## 2011-07-25 ENCOUNTER — Telehealth (INDEPENDENT_AMBULATORY_CARE_PROVIDER_SITE_OTHER): Payer: Self-pay

## 2011-09-01 ENCOUNTER — Other Ambulatory Visit: Payer: Self-pay | Admitting: Oncology

## 2011-09-01 ENCOUNTER — Encounter (HOSPITAL_BASED_OUTPATIENT_CLINIC_OR_DEPARTMENT_OTHER): Payer: Medicare Other | Admitting: Oncology

## 2011-09-01 DIAGNOSIS — N189 Chronic kidney disease, unspecified: Secondary | ICD-10-CM

## 2011-09-01 DIAGNOSIS — Z853 Personal history of malignant neoplasm of breast: Secondary | ICD-10-CM

## 2011-09-01 DIAGNOSIS — N039 Chronic nephritic syndrome with unspecified morphologic changes: Secondary | ICD-10-CM

## 2011-09-01 DIAGNOSIS — Z8543 Personal history of malignant neoplasm of ovary: Secondary | ICD-10-CM

## 2011-09-01 DIAGNOSIS — D631 Anemia in chronic kidney disease: Secondary | ICD-10-CM

## 2011-09-01 LAB — CBC WITH DIFFERENTIAL/PLATELET
BASO%: 0.5 % (ref 0.0–2.0)
EOS%: 2.1 % (ref 0.0–7.0)
HCT: 31.4 % — ABNORMAL LOW (ref 34.8–46.6)
LYMPH%: 13 % — ABNORMAL LOW (ref 14.0–49.7)
MCH: 32 pg (ref 25.1–34.0)
MCHC: 34.5 g/dL (ref 31.5–36.0)
MCV: 92.9 fL (ref 79.5–101.0)
MONO%: 10.8 % (ref 0.0–14.0)
NEUT%: 73.6 % (ref 38.4–76.8)
Platelets: 170 10*3/uL (ref 145–400)
RBC: 3.38 10*6/uL — ABNORMAL LOW (ref 3.70–5.45)

## 2011-09-01 LAB — COMPREHENSIVE METABOLIC PANEL
ALT: 10 U/L (ref 0–35)
AST: 14 U/L (ref 0–37)
Alkaline Phosphatase: 54 U/L (ref 39–117)
CO2: 25 mEq/L (ref 19–32)
Sodium: 136 mEq/L (ref 135–145)
Total Bilirubin: 1.3 mg/dL — ABNORMAL HIGH (ref 0.3–1.2)
Total Protein: 6.1 g/dL (ref 6.0–8.3)

## 2011-09-04 LAB — IRON AND TIBC
%SAT: 10 % — ABNORMAL LOW (ref 20–55)
TIBC: 283 ug/dL (ref 250–470)

## 2011-09-04 LAB — VITAMIN B12: Vitamin B-12: 218 pg/mL (ref 211–911)

## 2011-09-04 LAB — FERRITIN: Ferritin: 138 ng/mL (ref 10–291)

## 2011-09-29 ENCOUNTER — Other Ambulatory Visit (HOSPITAL_BASED_OUTPATIENT_CLINIC_OR_DEPARTMENT_OTHER): Payer: Medicare Other | Admitting: Lab

## 2011-09-29 ENCOUNTER — Telehealth: Payer: Self-pay | Admitting: Oncology

## 2011-09-29 ENCOUNTER — Ambulatory Visit (HOSPITAL_BASED_OUTPATIENT_CLINIC_OR_DEPARTMENT_OTHER): Payer: Medicare Other | Admitting: Oncology

## 2011-09-29 ENCOUNTER — Other Ambulatory Visit: Payer: Self-pay | Admitting: Oncology

## 2011-09-29 VITALS — BP 129/67 | HR 71 | Temp 96.9°F | Ht 62.5 in | Wt 104.9 lb

## 2011-09-29 DIAGNOSIS — Z8543 Personal history of malignant neoplasm of ovary: Secondary | ICD-10-CM

## 2011-09-29 DIAGNOSIS — Z23 Encounter for immunization: Secondary | ICD-10-CM

## 2011-09-29 DIAGNOSIS — D631 Anemia in chronic kidney disease: Secondary | ICD-10-CM

## 2011-09-29 DIAGNOSIS — D649 Anemia, unspecified: Secondary | ICD-10-CM

## 2011-09-29 DIAGNOSIS — Z853 Personal history of malignant neoplasm of breast: Secondary | ICD-10-CM

## 2011-09-29 DIAGNOSIS — D485 Neoplasm of uncertain behavior of skin: Secondary | ICD-10-CM

## 2011-09-29 DIAGNOSIS — D509 Iron deficiency anemia, unspecified: Secondary | ICD-10-CM

## 2011-09-29 DIAGNOSIS — C50919 Malignant neoplasm of unspecified site of unspecified female breast: Secondary | ICD-10-CM

## 2011-09-29 DIAGNOSIS — N189 Chronic kidney disease, unspecified: Secondary | ICD-10-CM

## 2011-09-29 LAB — CBC WITH DIFFERENTIAL/PLATELET
EOS%: 1.4 % (ref 0.0–7.0)
MCH: 31 pg (ref 25.1–34.0)
MCV: 91.7 fL (ref 79.5–101.0)
MONO%: 8.8 % (ref 0.0–14.0)
RBC: 3.61 10*6/uL — ABNORMAL LOW (ref 3.70–5.45)
RDW: 13.4 % (ref 11.2–14.5)

## 2011-09-29 MED ORDER — INFLUENZA VIRUS VACC SPLIT PF IM SUSP: 0.5000 mL | INTRAMUSCULAR | Status: DC

## 2011-09-29 MED ORDER — INFLUENZA VIRUS VACC SPLIT PF IM SUSP
0.5000 mL | INTRAMUSCULAR | Status: AC | PRN
  Administered 2011-09-29: 0.5 mL via INTRAMUSCULAR

## 2011-09-29 NOTE — Telephone Encounter (Signed)
gve the pt her may 2013 appt calendar 

## 2011-10-03 NOTE — Progress Notes (Signed)
Office Progress Note Date of Visit: Nov.13, 2012   Patient is seen, alone for the visit today, in continuing attention to her porocarcinoma involving LUE, this initially diagnosed by Dr.Fred Terri Piedra in spring 2012 then areas re-excised by Dr. Earney Navy at [facility]. The path report from [facility] described clear margins, tho at least one area was close. She is to see Dr.Levine in January; his follow up note mentioned another CT chest with his next visit.  Patient did have osteoporotic vertebral compression fracture as cause of her recent acute back pain. She has been seen by Para March and is set up to see another physician in [place] for osteoporosis intervention. She does not take calcium supplements as she has difficulty swallowing those tablets, and I have recommended Tums 1200 - 1500 mg daily. Mrs. Blum tells me that she was essentially at bedrest for 2 weeks with the severe back pain, during which time she did not eat well and lost weight. She understands from Dr.Voytek that vertebroplasty is not recommended at this time. Otherwise, she still has swelling LUE since the re-excisions at [facility], this related to axillary LN dissection and radiation for the node positive left breast cancer in 1994. She has noticed some tiny keratotic areas on the forearm and plans to see Dr.Lupton about these. She has had no symptoms of infection/cellutitis LUE. The left shoulder limited ROM is unchanged, that also since the [facility] surgery. Mrs. Pinela did have low iron studies with the labs done last month. She has been taking oral iron. She had negative colonoscopy by [facility] March 2012 ,so hemoccult cards were not done.   Review of Systems: as above. Still localized back pain worse with activity, but not requiring regular pain medication now. No bleeding. Appetite better now. No fever or symptoms of infection. No increased SOB, no abdominal pain.  Physical Exam:  Weight down 7 lbs to 104, 129/67,  71/regular 20/ not labored RA 96.9  PERRL  Mouth clear and moist. No cervical, supraclavicular or axillary adenopathy. Lungs clear. Swelling LUE mostly in forearm without erythema or heat. Surgical scars without obvious changes. Limited ROM left shoulder. Abd soft and NT, quiet. LE no edema, cords, tenderness.  Labs today: WBC 4.9, ANC 3.4, Hgb a little better at 11.2, MCV 91, plt 162K  Impression/Plan: 1. 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 To be seen back by Dr Terri Piedra and Dr. Lenis Noon. 2. Anemia: likely multifactorial including low iron studies Oct.2012, now back on oral iron which she will continue. Negative colonoscopy March 2012 and normal B12/folate. 3. History of T3 2/20 nodes postitive left breast ca 1994, ER positive, treated with mastectomy with node dissection, adria/cytoxan x4 cycles, tamoxifen x5 years and RT. Not known recurrent. Last mammogram [facility] May 11, 2011 4. IA clear cell ovarian ca 2004 treated with surgery followed by 3 cycles taxol/carbo. She has year followup with Dr.ClarkePearson, last June 2012. 5.Osteoporotic vertebral compression fracture in Oct.2012: symptoms gradually improving. 6. Flu vaccine was given today.  With the other physician appointments upcoming, I will see her back in 6 months with CBC and iron studies, or certainly sooner if I can be of help.  Jama Flavors, MD

## 2011-10-19 ENCOUNTER — Telehealth: Payer: Self-pay

## 2011-10-19 NOTE — Telephone Encounter (Signed)
  Phone Note Outgoing Call Call back at [facility] Phone [phone]   Call placed by: Lajean Saver RN,  May 01, 2011 12:37 PM Call placed to: Patient Action Taken: Phone Call Completed Summary of Call: CAllback: patient reports her symptoms are improving and she is taking her antibiotic.

## 2011-10-19 NOTE — Progress Notes (Signed)
Summary: Possible UTI   Vital Signs:  Patient Profile:   77 Years Old Female CC:      dysuria x  Height:     63.5 inches (160.02 cm) Weight:      109.8 pounds O2 Sat:      96 % O2 treatment:    Room Air Temp:     98.1 degrees F oral Pulse rate:   73 / minute Resp:     14 per minute BP sitting:   95 / 56  (left arm) Cuff size:   regular  Vitals Entered By: Lajean Saver RN (April 29, 2011 1:36 PM)                  Updated Prior Medication List: COREG 25 MG  TABS (CARVEDILOL) one twice daily ZOCOR 40 MG  TABS (SIMVASTATIN) 1 once daily FLONASE 50 MCG/ACT SUSP (FLUTICASONE PROPIONATE) as needed ASPIRIN 81 MG TBEC (ASPIRIN) Take one tablet by mouth daily STOOL SOFTENER 100 MG CAPS (DOCUSATE SODIUM) Take 1 capsule by mouth two times a day CALCIUM CARBONATE-VITAMIN D 600-400 MG-UNIT  TABS (CALCIUM CARBONATE-VITAMIN D) Take 1 tablet by mouth two times a day LISINOPRIL 20 MG TABS (LISINOPRIL) one twice daily AMLODIPINE BESYLATE 2.5 MG TABS (AMLODIPINE BESYLATE) one every morning LORATADINE 10 MG TABS (LORATADINE) as needed  Current Allergies (reviewed today): ! MS CONTIN (MORPHINE SULFATE) ! IBUPROFENHistory of Present Illness History from: patient Chief Complaint: dysuria x  History of Present Illness: 77 Years Old Female complains of UTI symptoms for a few days.  She describes the pain as burning during urination.  She is using Azo which helps. ++ dysuria + frequency No urgency No hematuria No vaginal discharge No fever/chills No lower abdomenal pain No back pain No fatigue   REVIEW OF SYSTEMS Constitutional Symptoms      Denies fever, chills, night sweats, weight loss, weight gain, and fatigue.  Eyes       Denies change in vision, eye pain, eye discharge, glasses, contact lenses, and eye surgery. Ear/Nose/Throat/Mouth       Denies hearing loss/aids, change in hearing, ear pain, ear discharge, dizziness, frequent runny nose, frequent nose bleeds, sinus problems,  sore throat, hoarseness, and tooth pain or bleeding.  Respiratory       Denies dry cough, productive cough, wheezing, shortness of breath, asthma, bronchitis, and emphysema/COPD.  Cardiovascular       Denies murmurs, chest pain, and tires easily with exhertion.    Gastrointestinal       Denies stomach pain, nausea/vomiting, diarrhea, constipation, blood in bowel movements, and indigestion. Genitourniary       Complains of painful urination.      Denies kidney stones and loss of urinary control. Neurological       Denies paralysis, seizures, and fainting/blackouts. Musculoskeletal       Denies muscle pain, joint pain, joint stiffness, decreased range of motion, redness, swelling, muscle weakness, and gout.  Skin       Denies bruising, unusual mles/lumps or sores, and hair/skin or nail changes.  Psych       Denies mood changes, temper/anger issues, anxiety/stress, speech problems, depression, and sleep problems.  Past History:  Past Medical History: Reviewed history from 09/23/2010 and no changes required. 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 Hysterectomy Mastectomy Cataract extraction Cholecystectomy L shoulder prosthesis R ankle ORIF  Family History: Reviewed history from 01/27/2011 and no changes required. the father died age 9 MI mother died age 79 MI one brother casualty of the [place] war 4 sisters, history of breast cancer  Social History: Reviewed history from 11/29/2008 and no changes required. Married Never Smoked Alcohol Use - no Physical Exam General appearance: well developed, well nourished, no acute distress Abdomen:  soft, non-tender without obvious organomegaly Back: no CVA or flank tenderness MSE: oriented to time, place, and person Assessment New Problems: URINARY TRACT INFECTION (ICD-599.0)   Patient Education: Patient and/or caregiver instructed in the following: rest, fluids.  Plan New Medications/Changes: CIPROFLOXACIN HCL 250 MG TABS (CIPROFLOXACIN HCL) 1 by mouth two times a day for 7 days  #14 x 0, 04/29/2011, Hoyt Koch MD  New Orders: New Patient Level III [phone] UA Dipstick w/o Micro (automated)  [81003] T-Culture, Urine [[id number]] Planning Comments:   Urine culture pending.  Take meds as directed.  Follow-up with your primary care physician if not improving or if getting worse   The patient and/or caregiver has been counseled thoroughly with regard to medications prescribed including dosage, schedule, interactions, rationale for use, and possible side effects and they verbalize understanding.  Diagnoses and expected course of recovery discussed and will return if not improved as expected or if the condition worsens. Patient and/or caregiver verbalized understanding.  Prescriptions: CIPROFLOXACIN HCL 250 MG TABS (CIPROFLOXACIN HCL) 1 by mouth two times a day for 7 days  #14 x 0   Entered and Authorized by:   Hoyt Koch MD   Signed by:   Hoyt Koch MD on 04/29/2011   Method used:   Print then Give to Patient   RxID:   [id number]   Orders Added: 1)  New Patient Level III [78469] 2)  UA Dipstick w/o Micro (automated)  [81003] 3)  T-Culture, Urine [[id number]]    Laboratory Results   Urine Tests  Date/Time Received: April 29, 2011 1:39 PM  Date/Time Reported: April 29, 2011 1:39 PM   Routine Urinalysis   Color: red Appearance: slightly cloudy Glucose: 1+   (Normal Range: Negative) Bilirubin: 2+   (Normal Range: Negative) Ketone: 1+   (Normal Range: Negative) Spec. Gravity: <1.005   (Normal Range: 1.003-1.035) Blood: trace-intact   (Normal  Range: Negative) pH: 5.0   (Normal Range: 5.0-8.0) Protein: 2+   (Normal Range: Negative) Urobilinogen: >=8.0   (Normal Range: 0-1) Nitrite: positive   (Normal Range: Negative) Leukocyte Esterace: 3+   (Normal Range: Negative)    Comments: Taken AZO

## 2011-10-19 NOTE — Telephone Encounter (Signed)
  Phone Note Outgoing Call   Call placed by: Linton Flemings RN,  July 25, 2011 11:01 AM Call placed to: Patient Summary of Call: PT STATES SHE IS DOING BETTER, INFORMED TO COMPLETE ABT

## 2011-10-19 NOTE — Progress Notes (Signed)
Summary: POSS UTI...WSE   Vital Signs:  Patient Profile:   77 Years Old Female CC:      possible UTI x today Height:     63.5 inches (160.02 cm) Weight:      111.75 pounds O2 Sat:      98 % O2 treatment:    Room Air Temp:     97.7 degrees F oral Pulse rate:   69 / minute Resp:     16 per minute BP sitting:   118 / 65  (left arm) Cuff size:   regular  Vitals Entered By: Clemens Catholic LPN (July 22, 2011 1:54 PM)                  Updated Prior Medication List: COREG 25 MG  TABS (CARVEDILOL) one twice daily ZOCOR 40 MG  TABS (SIMVASTATIN) 1 once daily FLONASE 50 MCG/ACT SUSP (FLUTICASONE PROPIONATE) as needed ASPIRIN 81 MG TBEC (ASPIRIN) Take one tablet by mouth daily STOOL SOFTENER 100 MG CAPS (DOCUSATE SODIUM) Take 1 capsule by mouth two times a day CALCIUM CARBONATE-VITAMIN D 600-400 MG-UNIT  TABS (CALCIUM CARBONATE-VITAMIN D) Take 1 tablet by mouth two times a day LISINOPRIL 20 MG TABS (LISINOPRIL) one twice daily AMLODIPINE BESYLATE 2.5 MG TABS (AMLODIPINE BESYLATE) one every morning LORATADINE 10 MG TABS (LORATADINE) as needed  Current Allergies (reviewed today): ! MS CONTIN (MORPHINE SULFATE) ! IBUPROFENHistory of Present Illness History from: patient Chief Complaint: possible UTI x today History of Present Illness: 77 Years Old Female complains of UTI symptoms for 1 days.  She describes the pain as burning during urination.  She has used Azo today which helps.  She had an E. coli UTI 3 months ago tx successfully with Cipro. + dysuria + frequency No urgency No hematuria No vaginal discharge No fever/chills + lower abdomenal pain No back pain No fatigue   REVIEW OF SYSTEMS Constitutional Symptoms      Denies fever, chills, night sweats, weight loss, weight gain, and fatigue.  Eyes       Complains of glasses and eye surgery.      Denies change in vision, eye pain, eye discharge, and contact lenses. Ear/Nose/Throat/Mouth       Denies hearing loss/aids,  change in hearing, ear pain, ear discharge, dizziness, frequent runny nose, frequent nose bleeds, sinus problems, sore throat, hoarseness, and tooth pain or bleeding.  Respiratory       Denies dry cough, productive cough, wheezing, shortness of breath, asthma, bronchitis, and emphysema/COPD.  Cardiovascular       Denies murmurs, chest pain, and tires easily with exhertion.    Gastrointestinal       Denies stomach pain, nausea/vomiting, diarrhea, constipation, blood in bowel movements, and indigestion. Genitourniary       Complains of painful urination.      Denies kidney stones and loss of urinary control. Neurological       Denies paralysis, seizures, and fainting/blackouts. Musculoskeletal       Denies muscle pain, joint pain, joint stiffness, decreased range of motion, redness, swelling, muscle weakness, and gout.  Skin       Denies bruising, unusual mles/lumps or sores, and hair/skin or nail changes.  Psych       Denies mood changes, temper/anger issues, anxiety/stress, speech problems, depression, and sleep problems. Other Comments: pt c/o painful, frequent urination x today. she has taken AZO. no fever.   Past History:  Past Medical History: Reviewed history from 09/23/2010 and no changes required. Breast  cancer, hx of  1994 (T3 N1 Mx) Ovarian CA, 2004-Clear cel-l Stage IA Coronary artery disease Hypertension Cardiomyopathy now improved Lymphedema Colonic polyps, hx of left bundle branch block COPD osteoporosis Hyperlipidemia Allergic rhinitis orthostatic syncope, November 2010  Past Surgical History: Reviewed history from 07/13/2007 and no changes required. Hysterectomy Mastectomy Cataract extraction Cholecystectomy L shoulder prosthesis R ankle ORIF  Family History: Reviewed history from 01/27/2011 and no changes required. the father died age 75 MI mother died age 33 MI one brother casualty of the [place] war 4 sisters, history of breast cancer  Social  History: Reviewed history from 11/29/2008 and no changes required. Married Never Smoked Alcohol Use - no Physical Exam General appearance: well developed, well nourished, no acute distress Abdomen: soft, non-tender without obvious organomegaly Back: no flank or cva tenderness MSE: oriented to time, place, and person Assessment New Problems: URINARY TRACT INFECTION (ICD-599.0)   Patient Education: Patient and/or caregiver instructed in the following: rest, fluids.  Plan New Medications/Changes: CIPROFLOXACIN HCL 250 MG TABS (CIPROFLOXACIN HCL) 1 by mouth two times a day for 7 days  #14 x 0, 07/22/2011, Hoyt Koch MD  New Orders: Est. Patient Level III [phone] UA Dipstick w/o Micro (automated)  [81003] T-Culture, Urine [[id number]] Planning Comments:   urine culture pending Follow-up with your primary care physician or urologist if not improving or if getting worse.  Need to est with urologist or speak to your PCP more about it if this is becoming a recurrent problem.   The patient and/or caregiver has been counseled thoroughly with regard to medications prescribed including dosage, schedule, interactions, rationale for use, and possible side effects and they verbalize understanding.  Diagnoses and expected course of recovery discussed and will return if not improved as expected or if the condition worsens. Patient and/or caregiver verbalized understanding.  Prescriptions: CIPROFLOXACIN HCL 250 MG TABS (CIPROFLOXACIN HCL) 1 by mouth two times a day for 7 days  #14 x 0   Entered and Authorized by:   Hoyt Koch MD   Signed by:   Hoyt Koch MD on 07/22/2011   Method used:   Print then Give to Patient   RxID:   [id number]   Orders Added: 1)  Est. Patient Level III [78469] 2)  UA Dipstick w/o Micro (automated)  [81003] 3)  T-Culture, Urine [[id number]]    Laboratory Results   Urine Tests  Date/Time Received: July 22, 2011 1:57 PM    Date/Time Reported: July 22, 2011 1:57 PM   Routine Urinalysis   Color: yellow Appearance: Cloudy Glucose: negative   (Normal Range: Negative) Bilirubin: 1+   (Normal Range: Negative) Ketone: negative   (Normal Range: Negative) Spec. Gravity: 1.025   (Normal Range: 1.003-1.035) Blood: 3+   (Normal Range: Negative) pH: 6.0   (Normal Range: 5.0-8.0) Protein: negative   (Normal Range: Negative) Urobilinogen: 0.2   (Normal Range: 0-1) Nitrite: negative   (Normal Range: Negative) Leukocyte Esterace: 1+   (Normal Range: Negative)

## 2011-10-19 NOTE — Telephone Encounter (Signed)
SPOKE WITH Judy Herrera ABOUT THE DIARRHEA SHE HAS BEEN EXPERIENCING RECENTLY WHICH RESOLVED WITH STOPPING HER IRON TABLET. DR. Darrold Span SAID THAT IRON TENDS TO CAUSE CONSTIPATION.  SHE CAN RESUME THE IRON AT 1 TAB DAILY MONDAY AND FRIDAY IF NO MORE ISSUES WITH DIARRHEA THIS WEEK.  IF THE DIARRHEA RECURS, SHE NEEDS TO SEE HER PCP OR HER GI DOCTOR.  PT. VERBALIZED UNDERSTANDING.

## 2011-12-21 ENCOUNTER — Other Ambulatory Visit: Payer: Self-pay | Admitting: Internal Medicine

## 2012-01-06 ENCOUNTER — Other Ambulatory Visit: Payer: Self-pay | Admitting: Internal Medicine

## 2012-01-09 ENCOUNTER — Other Ambulatory Visit: Payer: Self-pay | Admitting: Internal Medicine

## 2012-03-01 ENCOUNTER — Other Ambulatory Visit: Payer: Self-pay | Admitting: Internal Medicine

## 2012-03-28 ENCOUNTER — Other Ambulatory Visit: Payer: Self-pay | Admitting: Internal Medicine

## 2012-04-01 ENCOUNTER — Encounter: Payer: Self-pay | Admitting: Oncology

## 2012-04-01 ENCOUNTER — Telehealth: Payer: Self-pay | Admitting: Oncology

## 2012-04-01 ENCOUNTER — Other Ambulatory Visit (HOSPITAL_BASED_OUTPATIENT_CLINIC_OR_DEPARTMENT_OTHER): Payer: Medicare Other | Admitting: Lab

## 2012-04-01 ENCOUNTER — Ambulatory Visit (HOSPITAL_BASED_OUTPATIENT_CLINIC_OR_DEPARTMENT_OTHER): Payer: Medicare Other | Admitting: Oncology

## 2012-04-01 VITALS — BP 141/74 | HR 67 | Temp 98.3°F | Ht 62.5 in | Wt 104.3 lb

## 2012-04-01 DIAGNOSIS — C50919 Malignant neoplasm of unspecified site of unspecified female breast: Secondary | ICD-10-CM

## 2012-04-01 DIAGNOSIS — C4499 Other specified malignant neoplasm of skin, unspecified: Secondary | ICD-10-CM | POA: Insufficient documentation

## 2012-04-01 DIAGNOSIS — D509 Iron deficiency anemia, unspecified: Secondary | ICD-10-CM

## 2012-04-01 LAB — CBC WITH DIFFERENTIAL/PLATELET
Basophils Absolute: 0 10*3/uL (ref 0.0–0.1)
EOS%: 2.8 % (ref 0.0–7.0)
Eosinophils Absolute: 0.1 10*3/uL (ref 0.0–0.5)
HCT: 31.6 % — ABNORMAL LOW (ref 34.8–46.6)
HGB: 10.9 g/dL — ABNORMAL LOW (ref 11.6–15.9)
MCH: 32.4 pg (ref 25.1–34.0)
MONO#: 0.3 10*3/uL (ref 0.1–0.9)
NEUT#: 3 10*3/uL (ref 1.5–6.5)
NEUT%: 73.3 % (ref 38.4–76.8)
RDW: 13.2 % (ref 11.2–14.5)
WBC: 4.1 10*3/uL (ref 3.9–10.3)
lymph#: 0.6 10*3/uL — ABNORMAL LOW (ref 0.9–3.3)

## 2012-04-01 NOTE — Telephone Encounter (Signed)
appt made and printed for pt pts mammo at [facility] on 6.26 at 9:30     aom

## 2012-04-01 NOTE — Progress Notes (Signed)
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  Patient was diagnosed with porocarcinoma involving 3 areas left hand and arm in spring 2012, initially by Dr.Lupton. She had re-excisions of all areas (dorsum left hand, 2 on left forearm and left arm) by Dr Herbie Saxon, with clear margins (within 1 mm at hand). She had significant lymphedema LUE after those surgeries, that the side of her axillary node dissection and RT. Patient tells me that she saw Dr.Levine with chest CT in Jan 2013 which was not remarkable, Dr.Lupton in April with year follow up there, and Dr Lenis Noon earlier this week also now on year follow up with him. The LUE lymphedema is improved and not bothersome to her. 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 She has yearly follow up with Dr Yolande Jolly in June 2013. Her left breast cancer was T3N1 (2 of 20 nodes) ER+ in 1994, treated with mastectomy and axillary nodes, adriamycin/cytoxan x4, RT, and tamoxifen x 5 years. She is due mammogram at [facility] in late June. She has been slightly anemic, with iron studies low since at least Oct 2012, with normal B12 and folate and normal colonoscopy in March 2012 by  GI. She felt that daily oral iron (ferrous gluconate) was causing diarrhea, but has been tolerating this twice weekly since she was here last; she does take this on empty stomach with OJ. She is not aware of any bleeding and does not seem symptomatic from the mild anemia. I have discussed with pharmaceutical rep for Fusion Plus, which is ferrous fumarate 130 mg with Vit C and probiotic lactobacillus casei (+ folate), and have given patient #8 sample  tablets to try daily.  Otherwise patient tells me that she has felt generally well. She has some discomfort in back when she sits or stands for extended times, related to previous osteoporotic compression fractures. Back pain improves when she lies supine and she is still able to do short grocery trips etc. She has no new or different pain and no changes right breast or mastectomy area.  She has had no recent infections, no respiratory symptoms, appetite at baseline, no abdominal or pelvic discomfort, no bladder changes, no LE swelling, no bleeding. Remainder of 10 point Review of Systems negative. Objective:  Vital signs in last 24 hours:  BP 141/74  Pulse 67  Temp(Src) 98.3 F (36.8 C) (Oral)  Ht 5' 2.5" (1.588 m)  Wt 104 lb 4.8 oz (47.31 kg)  BMI 18.77 kg/m2 Weight is stable. Ambulatory without assistance, looks comfortable, very pleasant as always.  HEENT:mucous membranes moist, pharynx normal without lesions LymphaticsCervical, supraclavicular, and axillary nodes normal. Resp: clear to auscultation bilaterally and normal percussion bilaterally Cardio: regular rate and rhythm GI: soft, non-tender; bowel sounds normal; no masses,  no organomegaly Extremities: lymphedema mostly ventral left forearm and around elbow, no swelling in left hand and no erythema. No swelling RUE or LE. Neuro:nonfocal Left mastectomy scar without evidence of local recurrence, left axilla not remarkable. RIght breast without dominant mass, skin or nipple findings and right axilla not remarkable. Skin: excisional biopsy scars LUE all well-healed, without lesions that seem concerning.  Lab Results:   Basename 04/01/12 1027  WBC 4.1  HGB 10.9*  HCT 31.6*  PLT 155  MCV 94.3 ANC 3.0 RDW 13 Differential not remarkable BMET Not repeated. Studies/Results:  No results found.  Medications: I have reviewed the patient's current medications. Trial of Fusion Plus iron supplement as above. She will let us know  if she tolerates this better, for additional samples if possible or prescription if needed.  Assessment/Plan: 1.Porocarcinoma LUE: following with Drs Terri Piedra and Lenis Noon including CT chest Jan 2013 at [facility]. 2.iron deficiency anemia: hgb slightly lower and not tolerating daily oral iron with present preparation. See above.  3.T3N1 left breast cancer 1994 post multidisciplinary treatment and not known active.Mammograms June and overdue to see Dr Wenda Low, which we will set up. 4.lymphedema LUE following multiple excisions for porocarcinoma: somewhat improved, will follow 5.high grade ovarian carcinoma as above, following with gyn oncology yearly. 6. Osteoporosis with compression fractures last year, on calcium + D  I will see her back with CBC and iron studies in 6 months, and may go to yearly from there as she is being followed by multiple physicians in addition to primary Dr Frederica Kuster.   Judy Packer, MD   04/01/2012, 8:39 PM

## 2012-04-05 ENCOUNTER — Other Ambulatory Visit: Payer: Self-pay

## 2012-04-05 DIAGNOSIS — D649 Anemia, unspecified: Secondary | ICD-10-CM

## 2012-04-05 MED ORDER — FERROUS GLUCONATE 324 (38 FE) MG PO TABS: ORAL_TABLET | ORAL | Status: DC

## 2012-04-26 ENCOUNTER — Other Ambulatory Visit: Payer: Self-pay | Admitting: Internal Medicine

## 2012-05-04 ENCOUNTER — Encounter: Payer: Self-pay | Admitting: Cardiology

## 2012-05-04 ENCOUNTER — Ambulatory Visit (INDEPENDENT_AMBULATORY_CARE_PROVIDER_SITE_OTHER): Payer: Medicare Other | Admitting: Cardiology

## 2012-05-04 VITALS — BP 112/54 | HR 62 | Ht 62.5 in | Wt 103.8 lb

## 2012-05-04 DIAGNOSIS — I429 Cardiomyopathy, unspecified: Secondary | ICD-10-CM

## 2012-05-04 DIAGNOSIS — I428 Other cardiomyopathies: Secondary | ICD-10-CM

## 2012-05-04 DIAGNOSIS — I10 Essential (primary) hypertension: Secondary | ICD-10-CM

## 2012-05-04 DIAGNOSIS — E785 Hyperlipidemia, unspecified: Secondary | ICD-10-CM

## 2012-05-04 NOTE — Patient Instructions (Addendum)
Your physician wants you to follow-up in: ONE YEAR WITH DR Jens Som IN [place] You will receive a reminder letter in the mail two months in advance. If you don't receive a letter, please call our office to schedule the follow-up appointment.   Your physician recommends that you return for lab work WHEN FASTING IN [place]

## 2012-05-04 NOTE — Assessment & Plan Note (Signed)
LV function improved on most recent echocardiogram. Continue ACE inhibitor and beta blocker. 

## 2012-05-04 NOTE — Progress Notes (Signed)
HPI: Judy Herrera is a very pleasant  female with a history of cardiomyopathy out of proportion to coronary artery disease.  She did have a cardiac catheterization performed on August 18, 2005.  At that time, she had a 60% OM, but otherwise nonobstructive coronary artery disease.  Ejection fraction was 45%.  The last echocardiogram in January 2010 showed normal LV function, mild aortic insufficiency and mitral regurgitation. I last saw her in March of 2012. Since then the patient has dyspnea with more extreme activities but not with routine activities. It is relieved with rest. It is not associated with chest pain. There is no orthopnea, PND or pedal edema. There is no syncope or palpitations. There is no exertional chest pain.  Current Outpatient Prescriptions  Medication Sig Dispense Refill  . aspirin 81 MG tablet Take 81 mg by mouth daily.        . Calcium Carbonate-Vitamin D (CALCIUM + D PO) Take 600 mg by mouth 2 (two) times daily.       . Cholecalciferol (VITAMIN D) 2000 UNITS tablet Take 2,000 Units by mouth daily.      . Coenzyme Q10 (CO Q 10) 100 MG CAPS Take 1 capsule by mouth daily.      Marland Kitchen COREG 25 MG tablet TAKE 1 TABLET TWICE A DAY  120 each  0  . denosumab (PROLIA) 60 MG/ML SOLN injection Inject 60 mg into the skin every 6 (six) months. Administer in upper arm, thigh, or abdomen      . ferrous gluconate (FERGON) 324 MG tablet Daily on empty stomach with OJ as tolerated  30 tablet  4  . HYDROcodone-acetaminophen (NORCO) 5-325 MG per tablet Take 1 tablet by mouth every 6 (six) hours as needed.        Marland Kitchen lisinopril (PRINIVIL,ZESTRIL) 20 MG tablet TAKE 1 TABLET TWICE A DAY  120 tablet  0  . NORVASC 2.5 MG tablet TAKE 1 TABLET EVERY MORNING.  30 each  0  . polyethylene glycol (MIRALAX / GLYCOLAX) packet Take 17 g by mouth 2 (two) times daily as needed.        Marland Kitchen ULTRAM 50 MG tablet TAKE (1) TABLET EVERY SIX HOURS AS NEEDED.  30 each  0  . ZOCOR 40 MG tablet TAKE 1 TABLET DAILY  60 each  0       Past Medical History  Diagnosis Date  . Hypertension   . Allergy   . Cancer   . Ovarian ca   . Cataract   . Breast CA   . CAD (coronary artery disease)   . Hyperlipidemia   . Cardiomyopathy     improved    Past Surgical History  Procedure Date  . Left breast mastectomy   . Shoulder arthroscopy   . Right leg   . Shoulder surgery     replacement  . Modified radical mastectomy w/ axillary lymph node dissection w/ pectoralis minor excision     Left  . Eye surgery     CATARACTS  . Abdominal hysterectomy 2004  . Cholecystectomy 1993    History   Social History  . Marital Status: Widowed    Spouse Name: N/A    Number of Children: N/A  . Years of Education: N/A   Occupational History  . Not on file.   Social History Main Topics  . Smoking status: Never Smoker   . Smokeless tobacco: Not on file  . Alcohol Use: No  . Drug Use: No  .  Sexually Active: Not on file     didn't ask   Other Topics Concern  . Not on file   Social History Narrative  . No narrative on file    ROS: no fevers or chills, productive cough, hemoptysis, dysphasia, odynophagia, melena, hematochezia, dysuria, hematuria, rash, seizure activity, orthopnea, PND, pedal edema, claudication. Remaining systems are negative.  Physical Exam: Well-developed well-nourished in no acute distress.  Skin is warm and dry.  HEENT is normal.  Neck is supple.  Chest is clear to auscultation with normal expansion.  Cardiovascular exam is regular rate and rhythm.  Abdominal exam nontender or distended. No masses palpated. Extremities show no edema. neuro grossly intact  ECG sinus rhythm at a rate of 62. Left bundle branch block.

## 2012-05-04 NOTE — Assessment & Plan Note (Signed)
Continue statin. Check lipids and liver. 

## 2012-05-04 NOTE — Assessment & Plan Note (Signed)
Continue aspirin and statin. 

## 2012-05-04 NOTE — Assessment & Plan Note (Signed)
Blood pressure controlled. Continue present medications. Check potassium and renal function. 

## 2012-05-13 ENCOUNTER — Ambulatory Visit (HOSPITAL_BASED_OUTPATIENT_CLINIC_OR_DEPARTMENT_OTHER): Payer: Medicare Other | Admitting: Lab

## 2012-05-13 ENCOUNTER — Ambulatory Visit: Payer: Medicare Other | Attending: Gynecology | Admitting: Gynecology

## 2012-05-13 ENCOUNTER — Encounter: Payer: Self-pay | Admitting: Gynecology

## 2012-05-13 VITALS — BP 132/64 | HR 68 | Temp 98.4°F | Resp 14 | Ht 62.68 in | Wt 103.0 lb

## 2012-05-13 DIAGNOSIS — Z809 Family history of malignant neoplasm, unspecified: Secondary | ICD-10-CM | POA: Insufficient documentation

## 2012-05-13 DIAGNOSIS — Z8249 Family history of ischemic heart disease and other diseases of the circulatory system: Secondary | ICD-10-CM | POA: Insufficient documentation

## 2012-05-13 DIAGNOSIS — Z803 Family history of malignant neoplasm of breast: Secondary | ICD-10-CM | POA: Insufficient documentation

## 2012-05-13 DIAGNOSIS — Z853 Personal history of malignant neoplasm of breast: Secondary | ICD-10-CM | POA: Insufficient documentation

## 2012-05-13 DIAGNOSIS — Z90710 Acquired absence of both cervix and uterus: Secondary | ICD-10-CM | POA: Insufficient documentation

## 2012-05-13 DIAGNOSIS — Z8543 Personal history of malignant neoplasm of ovary: Secondary | ICD-10-CM | POA: Insufficient documentation

## 2012-05-13 DIAGNOSIS — I519 Heart disease, unspecified: Secondary | ICD-10-CM | POA: Insufficient documentation

## 2012-05-13 DIAGNOSIS — C569 Malignant neoplasm of unspecified ovary: Secondary | ICD-10-CM

## 2012-05-13 DIAGNOSIS — I10 Essential (primary) hypertension: Secondary | ICD-10-CM | POA: Insufficient documentation

## 2012-05-13 DIAGNOSIS — I2510 Atherosclerotic heart disease of native coronary artery without angina pectoris: Secondary | ICD-10-CM | POA: Insufficient documentation

## 2012-05-13 DIAGNOSIS — E785 Hyperlipidemia, unspecified: Secondary | ICD-10-CM | POA: Insufficient documentation

## 2012-05-13 DIAGNOSIS — Z9010 Acquired absence of unspecified breast and nipple: Secondary | ICD-10-CM | POA: Insufficient documentation

## 2012-05-13 DIAGNOSIS — Z7982 Long term (current) use of aspirin: Secondary | ICD-10-CM | POA: Insufficient documentation

## 2012-05-13 DIAGNOSIS — Z9221 Personal history of antineoplastic chemotherapy: Secondary | ICD-10-CM | POA: Insufficient documentation

## 2012-05-13 DIAGNOSIS — Z888 Allergy status to other drugs, medicaments and biological substances status: Secondary | ICD-10-CM | POA: Insufficient documentation

## 2012-05-13 DIAGNOSIS — Z885 Allergy status to narcotic agent status: Secondary | ICD-10-CM | POA: Insufficient documentation

## 2012-05-13 DIAGNOSIS — Z124 Encounter for screening for malignant neoplasm of cervix: Secondary | ICD-10-CM | POA: Insufficient documentation

## 2012-05-13 NOTE — Patient Instructions (Signed)
We will contact you with her CA 125 report. And we look forward to seeing you in one year for another visit.

## 2012-05-13 NOTE — Progress Notes (Signed)
Consult Note: Gyn-Onc   Judy Herrera 78 y.o. female  Chief Complaint  Patient presents with  . Ovarian Cancer    Follow up    Interval History:  The patient returns today as previously scheduled for routine followup of ovarian cancer. Since her last visit a year ago she's done well. Her only medical problems been that of a vertebral body prolapse. She is taking calcium and bisphosphonate. She denies any GI or GU symptoms. She has no abdominal bloating or pelvic pressure pain or bleeding. Functional status is good.  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 The patient underwent initial surgical staging followed by 3 cycles of carboplatin and Taxol chemotherapy completed in November of 2004. She's done well with no evidence of recurrent disease.  Review of Systems:10 point review of systems is negative as noted above.   Vitals: Blood pressure 132/64, pulse 68, temperature 98.4 F (36.9 C), temperature source Oral, resp. rate 14, height 5' 2.68" (1.592 m), weight 103 lb (46.72 kg).  Physical Exam: General : The patient is a healthy woman in no acute distress.  HEENT: normocephalic, extraoccular movements normal; neck is supple without thyromegally  Lynphnodes: Supraclavicular and inguinal nodes not enlarged  Abdomen: Soft, non-tender, no ascites, no organomegally, no masses, no hernias  Pelvic:  EGBUS: Normal female  Vagina: Normal, no lesions  Urethra and Bladder: Normal, non-tender  Cervix: Surgically absent  Uterus: Surgically absent  Bi-manual examination: Non-tender; no adenxal masses or nodularity  Rectal: normal sphincter tone, no masses, no blood  Lower extremities: No edema or varicosities. 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 Clinically NED. CA 125 will be obtained today. She will return to see [place] in one year for continuing followup.  Allergies  Allergen Reactions  . Ibuprofen Nausea Only  . Morphine  Nausea And Vomiting    Past Medical History  Diagnosis Date  . Hypertension   . Allergy   . Cancer   . Ovarian ca   . Cataract   . Breast CA   . CAD (coronary artery disease)   . Hyperlipidemia   . Cardiomyopathy     improved    Past Surgical History  Procedure Date  . Left breast mastectomy   . Shoulder arthroscopy   . Right leg   . Shoulder surgery     replacement  . Modified radical mastectomy w/ axillary lymph node dissection w/ pectoralis minor excision     Left  . Eye surgery     CATARACTS  . Abdominal hysterectomy 2004  . Cholecystectomy 1993    Current Outpatient Prescriptions  Medication Sig Dispense Refill  . aspirin 81 MG tablet Take 81 mg by mouth daily.        . Calcium Carbonate-Vitamin D (CALCIUM + D PO) Take 600 mg by mouth 2 (two) times daily.       . Cholecalciferol (VITAMIN D) 2000 UNITS tablet Take 2,000 Units by mouth daily.      . Coenzyme Q10 (CO Q 10) 100 MG CAPS Take 1 capsule by mouth daily.      Marland Kitchen COREG 25 MG tablet TAKE 1 TABLET TWICE A DAY  120 each  0  . denosumab (PROLIA) 60 MG/ML SOLN injection Inject 60 mg into the skin every 6 (six) months. Administer in upper arm, thigh, or abdomen      . ferrous gluconate (FERGON) 324 MG tablet Daily on empty stomach with OJ as  tolerated  30 tablet  4  . HYDROcodone-acetaminophen (NORCO) 5-325 MG per tablet Take 1 tablet by mouth every 6 (six) hours as needed.        Marland Kitchen lisinopril (PRINIVIL,ZESTRIL) 20 MG tablet TAKE 1 TABLET TWICE A DAY  120 tablet  0  . NORVASC 2.5 MG tablet TAKE 1 TABLET EVERY MORNING.  30 each  0  . polyethylene glycol (MIRALAX / GLYCOLAX) packet Take 17 g by mouth 2 (two) times daily as needed.        Marland Kitchen ULTRAM 50 MG tablet TAKE (1) TABLET EVERY SIX HOURS AS NEEDED.  30 each  0  . ZOCOR 40 MG tablet TAKE 1 TABLET DAILY  60 each  0    History   Social History  . Marital Status: Widowed    Spouse Name: N/A    Number of Children: N/A  . Years of Education: N/A   Occupational  History  . Not on file.   Social History Main Topics  . Smoking status: Never Smoker   . Smokeless tobacco: Not on file  . Alcohol Use: No  . Drug Use: No  . Sexually Active: Not on file     didn't ask   Other Topics Concern  . Not on file   Social History Narrative  . No narrative on file    Family History  Problem Relation Age of Onset  . Breast cancer Sister   . Cancer Sister   . Heart disease Sister   . Heart disease Father       Jeannette Corpus, MD 05/13/2012, 8:26 AM

## 2012-05-16 ENCOUNTER — Telehealth: Payer: Self-pay | Admitting: Gynecologic Oncology

## 2012-05-16 NOTE — Telephone Encounter (Signed)
Patient informed of CA 125 results: 8.4.  No questions or concerns voiced.

## 2012-05-17 ENCOUNTER — Other Ambulatory Visit: Payer: Self-pay | Admitting: Cardiology

## 2012-05-17 ENCOUNTER — Encounter: Payer: Self-pay | Admitting: Cardiology

## 2012-05-17 LAB — HEPATIC FUNCTION PANEL
ALT: 10 U/L (ref 0–35)
AST: 15 U/L (ref 0–37)
Albumin: 4 g/dL (ref 3.5–5.2)
Alkaline Phosphatase: 36 U/L — ABNORMAL LOW (ref 39–117)
Total Bilirubin: 1.3 mg/dL — ABNORMAL HIGH (ref 0.3–1.2)
Total Protein: 6 g/dL (ref 6.0–8.3)

## 2012-05-17 LAB — BASIC METABOLIC PANEL WITH GFR
BUN: 13 mg/dL (ref 6–23)
Chloride: 102 mEq/L (ref 96–112)
GFR, Est African American: 64 mL/min
GFR, Est Non African American: 55 mL/min — ABNORMAL LOW
Potassium: 3.8 mEq/L (ref 3.5–5.3)
Sodium: 137 mEq/L (ref 135–145)

## 2012-05-17 LAB — LIPID PANEL
HDL: 57 mg/dL (ref >39)
Total CHOL/HDL Ratio: 2.2 Ratio
Triglycerides: 68 mg/dL (ref <150)

## 2012-05-18 ENCOUNTER — Encounter: Payer: Self-pay | Admitting: *Deleted

## 2012-05-20 ENCOUNTER — Encounter (INDEPENDENT_AMBULATORY_CARE_PROVIDER_SITE_OTHER): Payer: Self-pay | Admitting: Surgery

## 2012-05-20 ENCOUNTER — Ambulatory Visit (INDEPENDENT_AMBULATORY_CARE_PROVIDER_SITE_OTHER): Payer: Medicare Other | Admitting: Surgery

## 2012-05-20 VITALS — BP 127/78 | HR 75 | Temp 97.0°F | Ht 62.5 in | Wt 104.8 lb

## 2012-05-20 DIAGNOSIS — Z853 Personal history of malignant neoplasm of breast: Secondary | ICD-10-CM

## 2012-05-20 NOTE — Progress Notes (Signed)
Judy Herrera 78 y.o.  Body mass index is 18.86 kg/(m^2).  Patient Active Problem List  Diagnosis  . BENIGN NEOPLASM SKIN OTHER&UNSPEC PARTS FACE  . HYPERLIPIDEMIA  . HYPERTENSION  . CORONARY ARTERY DISEASE  . OTHER PRIMARY CARDIOMYOPATHIES  . UPPER RESPIRATORY INFECTION, VIRAL  . ALLERGIC RHINITIS  . COPD  . UTI  . BACK PAIN  . OSTEOPOROSIS  . SYNCOPE  . BREAST CANCER, HX OF  . COLONIC POLYPS, HX OF  . WRIST PAIN, RIGHT  . TENDINITIS, RIGHT WRIST  . Eccrine porocarcinoma of skin    Allergies  Allergen Reactions  . Ibuprofen Nausea Only  . Morphine Nausea And Vomiting    Past Surgical History  Procedure Date  . Left breast mastectomy   . Shoulder arthroscopy   . Right leg   . Shoulder surgery     replacement  . Modified radical mastectomy w/ axillary lymph node dissection w/ pectoralis minor excision     Left  . Eye surgery     CATARACTS  . Abdominal hysterectomy 2004  . Cholecystectomy 1993   Rogelia Boga, MD No diagnosis found.   19 years out from T3 N1 Mx left breast cancer treated with mastectomy.  Exam today showed the left side had smooth flaps and no suspicious areas. Her recent mammogram at [facility] was negative for were some areas in her right breast. Physical exam of the right breast showed no areas of concern.  She has no significant lymphedema and overall is doing very well considering all things. I will see her again in one year.  Matt B. Daphine Deutscher, MD, [facility] [phone] beeper [phone]  05/20/2012 4:43 PM

## 2012-05-20 NOTE — Patient Instructions (Addendum)
Follow up in 1 year.

## 2012-05-21 ENCOUNTER — Other Ambulatory Visit: Payer: Self-pay | Admitting: Internal Medicine

## 2012-05-23 ENCOUNTER — Other Ambulatory Visit: Payer: Self-pay | Admitting: Internal Medicine

## 2012-05-28 ENCOUNTER — Other Ambulatory Visit: Payer: Self-pay | Admitting: Internal Medicine

## 2012-05-30 ENCOUNTER — Other Ambulatory Visit: Payer: Self-pay | Admitting: Oncology

## 2012-05-30 DIAGNOSIS — C4499 Other specified malignant neoplasm of skin, unspecified: Secondary | ICD-10-CM

## 2012-05-30 DIAGNOSIS — C50919 Malignant neoplasm of unspecified site of unspecified female breast: Secondary | ICD-10-CM

## 2012-06-03 ENCOUNTER — Other Ambulatory Visit: Payer: Self-pay | Admitting: Internal Medicine

## 2012-06-16 ENCOUNTER — Other Ambulatory Visit: Payer: Self-pay | Admitting: Internal Medicine

## 2012-06-27 ENCOUNTER — Telehealth: Payer: Self-pay | Admitting: Internal Medicine

## 2012-06-27 MED ORDER — AMLODIPINE BESYLATE 2.5 MG PO TABS: 2.5000 mg | ORAL_TABLET | Freq: Every day | ORAL | Status: DC

## 2012-06-27 NOTE — Telephone Encounter (Signed)
Pt called and is aware that pcp is out of office this week and that pt has to sch med check appt in order to rcv refills of NORVASC 2.5 MG tablet. Pt is sched for Chemo the wk of 07/05/12 and will not be able to come in for ov that wk. Pt is completely out of med. Adult nurse in [place].

## 2012-06-27 NOTE — Telephone Encounter (Signed)
Gave a 30 day rx - must be seen for ANY future refills

## 2012-07-12 ENCOUNTER — Ambulatory Visit (INDEPENDENT_AMBULATORY_CARE_PROVIDER_SITE_OTHER): Payer: Medicare Other | Admitting: Internal Medicine

## 2012-07-12 ENCOUNTER — Encounter: Payer: Self-pay | Admitting: Internal Medicine

## 2012-07-12 VITALS — BP 100/70 | Temp 98.0°F | Wt 105.0 lb

## 2012-07-12 DIAGNOSIS — E785 Hyperlipidemia, unspecified: Secondary | ICD-10-CM

## 2012-07-12 DIAGNOSIS — I10 Essential (primary) hypertension: Secondary | ICD-10-CM

## 2012-07-12 DIAGNOSIS — C4499 Other specified malignant neoplasm of skin, unspecified: Secondary | ICD-10-CM

## 2012-07-12 DIAGNOSIS — M810 Age-related osteoporosis without current pathological fracture: Secondary | ICD-10-CM

## 2012-07-12 MED ORDER — AMLODIPINE BESYLATE 2.5 MG PO TABS: 2.5000 mg | ORAL_TABLET | Freq: Every day | ORAL | Status: DC

## 2012-07-12 MED ORDER — LISINOPRIL 20 MG PO TABS: 20.0000 mg | ORAL_TABLET | Freq: Every day | ORAL | Status: DC

## 2012-07-12 MED ORDER — CARVEDILOL 25 MG PO TABS: 25.0000 mg | ORAL_TABLET | Freq: Two times a day (BID) | ORAL | Status: DC

## 2012-07-12 MED ORDER — HYDROCODONE-ACETAMINOPHEN 5-325 MG PO TABS: 1.0000 | ORAL_TABLET | Freq: Four times a day (QID) | ORAL | Status: DC | PRN

## 2012-07-12 MED ORDER — TRAMADOL HCL 50 MG PO TABS: 50.0000 mg | ORAL_TABLET | Freq: Four times a day (QID) | ORAL | Status: DC | PRN

## 2012-07-12 MED ORDER — CELECOXIB 200 MG PO CAPS: 200.0000 mg | ORAL_CAPSULE | Freq: Every day | ORAL | Status: DC

## 2012-07-12 MED ORDER — SIMVASTATIN 40 MG PO TABS: 40.0000 mg | ORAL_TABLET | Freq: Every day | ORAL | Status: DC

## 2012-07-12 NOTE — Progress Notes (Signed)
Subjective:    Patient ID: Judy Herrera, female    DOB: Jan 18, 1931, 78 y.o.   MRN: [id number]  HPI   78 year old patient who is seen today for followup. She has not been seen at this office in greater than one year medical problems include hypertension coronary artery disease as well as osteoporosis. Central last visit here she has been followed closely by dermatology and oncology do to a rare eccrine porocarcinoma.  She has recently been hospitalized overnight at [facility] for local infusion of the left arm. She has done quite well. Her need for multiple medication refills is what prompted her visit today. She denies any cardiopulmonary complaints. Other medical problems include a remote history of breast cancer. She has mild COPD which has been stable.  Past Medical History  Diagnosis Date  . Hypertension   . Allergy   . Cancer   . Ovarian ca   . Cataract   . Breast CA   . CAD (coronary artery disease)   . Hyperlipidemia   . Cardiomyopathy     improved  . Arthritis   . Osteoporosis     History   Social History  . Marital Status: Widowed    Spouse Name: N/A    Number of Children: N/A  . Years of Education: N/A   Occupational History  . Not on file.   Social History Main Topics  . Smoking status: Never Smoker   . Smokeless tobacco: Not on file  . Alcohol Use: No  . Drug Use: No  . Sexually Active: Not on file     didn't ask   Other Topics Concern  . Not on file   Social History Narrative  . No narrative on file    Past Surgical History  Procedure Date  . Left breast mastectomy 1994    T3 N1 Mx  . Shoulder arthroscopy   . Right leg   . Shoulder surgery     replacement  . Modified radical mastectomy w/ axillary lymph node dissection w/ pectoralis minor excision 1994    Left  . Eye surgery     CATARACTS  . Abdominal hysterectomy 2004  . Cholecystectomy 1993  . Breast surgery     Family History  Problem Relation Age of Onset  . Breast cancer  Sister   . Cancer Sister   . Heart disease Sister   . Heart disease Father   . Heart disease Mother     Allergies  Allergen Reactions  . Ibuprofen Nausea Only  . Morphine Nausea And Vomiting    Current Outpatient Prescriptions on File Prior to Visit  Medication Sig Dispense Refill  . acyclovir (ZOVIRAX) 400 MG tablet TAKE 1/2 TABLET 3 TIMES DAILY FOR FEVER BLISTERS  45 tablet  4  . amLODipine (NORVASC) 2.5 MG tablet Take 1 tablet (2.5 mg total) by mouth daily.  30 tablet  0  . aspirin 81 MG tablet Take 81 mg by mouth daily.        . Calcium Carbonate-Vitamin D (CALCIUM + D PO) Take 600 mg by mouth 2 (two) times daily.       . Cholecalciferol (VITAMIN D) 2000 UNITS tablet Take 2,000 Units by mouth daily.      . Coenzyme Q10 (CO Q 10) 100 MG CAPS Take 1 capsule by mouth daily.      Marland Kitchen COREG 25 MG tablet TAKE 1 TABLET TWICE A DAY  60 each  0  . denosumab (PROLIA) 60 MG/ML SOLN injection  Inject 60 mg into the skin every 6 (six) months. Administer in upper arm, thigh, or abdomen      . ferrous gluconate (FERGON) 324 MG tablet Daily on empty stomach with OJ as tolerated  30 tablet  4  . lisinopril (PRINIVIL,ZESTRIL) 20 MG tablet TAKE 1 TABLET TWICE A DAY  60 tablet  0  . polyethylene glycol (MIRALAX / GLYCOLAX) packet Take 17 g by mouth 2 (two) times daily as needed.        Marland Kitchen ULTRAM 50 MG tablet TAKE (1) TABLET EVERY SIX HOURS AS NEEDED.  15 each  0  . ZOCOR 40 MG tablet TAKE 1 TABLET DAILY  30 each  0    BP 100/70  Temp 98 F (36.7 C) (Oral)  Wt 105 lb (47.628 kg)      Review of Systems  Constitutional: Negative.   HENT: Negative for hearing loss, congestion, sore throat, rhinorrhea, dental problem, sinus pressure and tinnitus.   Eyes: Negative for pain, discharge and visual disturbance.  Respiratory: Negative for cough and shortness of breath.   Cardiovascular: Negative for chest pain, palpitations and leg swelling.  Gastrointestinal: Negative for nausea, vomiting, abdominal  pain, diarrhea, constipation, blood in stool and abdominal distention.  Genitourinary: Negative for dysuria, urgency, frequency, hematuria, flank pain, vaginal bleeding, vaginal discharge, difficulty urinating, vaginal pain and pelvic pain.  Musculoskeletal: Negative for joint swelling, arthralgias and gait problem.  Skin: Positive for rash.  Neurological: Negative for dizziness, syncope, speech difficulty, weakness, numbness and headaches.  Hematological: Negative for adenopathy.  Psychiatric/Behavioral: Negative for behavioral problems, dysphoric mood and agitation. The patient is not nervous/anxious.        Objective:   Physical Exam  Constitutional: She is oriented to person, place, and time. She appears well-developed and well-nourished.  HENT:  Head: Normocephalic.  Right Ear: External ear normal.  Left Ear: External ear normal.  Mouth/Throat: Oropharynx is clear and moist.  Eyes: Conjunctivae and EOM are normal. Pupils are equal, round, and reactive to light.  Neck: Normal range of motion. Neck supple. No thyromegaly present.  Cardiovascular: Normal rate, regular rhythm, normal heart sounds and intact distal pulses.   Pulmonary/Chest: Effort normal and breath sounds normal.  Abdominal: Soft. Bowel sounds are normal. She exhibits no mass. There is no tenderness.  Musculoskeletal: Normal range of motion.  Lymphadenopathy:    She has no cervical adenopathy.  Neurological: She is alert and oriented to person, place, and time.  Skin: Skin is warm and dry. No rash noted.       Multiple subcutaneous nodular densities involving her left distal arm Soft tissue swelling distal to the left elbow  Psychiatric: She has a normal mood and affect. Her behavior is normal.          Assessment & Plan:   coronary artery disease. Stable hypertension well controlled Dyslipidemia Osteoporosis  Will followup with oncology All medications refilled Recheck 6 months

## 2012-07-12 NOTE — Patient Instructions (Addendum)
Limit your sodium (Salt) intake  Return in 6 months for follow-up  Call or return to clinic prn if these symptoms worsen or fail to improve as anticipated.  

## 2012-07-15 ENCOUNTER — Ambulatory Visit: Payer: Medicare Other | Admitting: Internal Medicine

## 2012-07-25 ENCOUNTER — Other Ambulatory Visit: Payer: Self-pay | Admitting: Internal Medicine

## 2012-08-26 ENCOUNTER — Other Ambulatory Visit: Payer: Self-pay | Admitting: Internal Medicine

## 2012-09-07 ENCOUNTER — Telehealth: Payer: Self-pay | Admitting: Cardiology

## 2012-09-07 NOTE — Telephone Encounter (Signed)
When pt takes her b/p the machine reads that her heart is beating irregular and she also feels as if her heart pauses sometimes through out the day pt needs a call after 3pm but before 4:30p

## 2012-09-14 NOTE — Telephone Encounter (Signed)
Pt rtn your call

## 2012-09-14 NOTE — Telephone Encounter (Signed)
Left message for pt to call.

## 2012-09-14 NOTE — Telephone Encounter (Signed)
Spoke with pt, she reports the skipping has calmed down and she is feeling fine. Will remove moble number from list

## 2012-09-30 ENCOUNTER — Ambulatory Visit (HOSPITAL_BASED_OUTPATIENT_CLINIC_OR_DEPARTMENT_OTHER): Payer: Medicare Other | Admitting: Oncology

## 2012-09-30 ENCOUNTER — Ambulatory Visit (HOSPITAL_BASED_OUTPATIENT_CLINIC_OR_DEPARTMENT_OTHER): Payer: Medicare Other

## 2012-09-30 ENCOUNTER — Telehealth: Payer: Self-pay | Admitting: Oncology

## 2012-09-30 ENCOUNTER — Encounter: Payer: Self-pay | Admitting: Oncology

## 2012-09-30 ENCOUNTER — Telehealth: Payer: Self-pay

## 2012-09-30 VITALS — BP 133/76 | HR 84 | Temp 97.0°F | Resp 20 | Ht 62.5 in | Wt 102.8 lb

## 2012-09-30 DIAGNOSIS — M810 Age-related osteoporosis without current pathological fracture: Secondary | ICD-10-CM

## 2012-09-30 DIAGNOSIS — Z853 Personal history of malignant neoplasm of breast: Secondary | ICD-10-CM

## 2012-09-30 DIAGNOSIS — C569 Malignant neoplasm of unspecified ovary: Secondary | ICD-10-CM

## 2012-09-30 DIAGNOSIS — D509 Iron deficiency anemia, unspecified: Secondary | ICD-10-CM

## 2012-09-30 DIAGNOSIS — Z1231 Encounter for screening mammogram for malignant neoplasm of breast: Secondary | ICD-10-CM

## 2012-09-30 DIAGNOSIS — R609 Edema, unspecified: Secondary | ICD-10-CM

## 2012-09-30 DIAGNOSIS — C44601 Unspecified malignant neoplasm of skin of unspecified upper limb, including shoulder: Secondary | ICD-10-CM

## 2012-09-30 DIAGNOSIS — C50919 Malignant neoplasm of unspecified site of unspecified female breast: Secondary | ICD-10-CM

## 2012-09-30 DIAGNOSIS — C4499 Other specified malignant neoplasm of skin, unspecified: Secondary | ICD-10-CM

## 2012-09-30 LAB — COMPREHENSIVE METABOLIC PANEL (CC13)
Alkaline Phosphatase: 50 U/L (ref 40–150)
BUN: 17 mg/dL (ref 7.0–26.0)
CO2: 31 mEq/L — ABNORMAL HIGH (ref 22–29)
Glucose: 72 mg/dl (ref 70–99)
Sodium: 137 mEq/L (ref 136–145)
Total Bilirubin: 1.31 mg/dL — ABNORMAL HIGH (ref 0.20–1.20)
Total Protein: 6.2 g/dL — ABNORMAL LOW (ref 6.4–8.3)

## 2012-09-30 LAB — IRON AND TIBC
Iron: 79 ug/dL (ref 42–145)
TIBC: 298 ug/dL (ref 250–470)
UIBC: 219 ug/dL (ref 125–400)

## 2012-09-30 LAB — CBC WITH DIFFERENTIAL/PLATELET
BASO%: 0.9 % (ref 0.0–2.0)
Basophils Absolute: 0 10*3/uL (ref 0.0–0.1)
EOS%: 2.7 % (ref 0.0–7.0)
HGB: 11.4 g/dL — ABNORMAL LOW (ref 11.6–15.9)
MCH: 32.8 pg (ref 25.1–34.0)
MCHC: 34.8 g/dL (ref 31.5–36.0)
MCV: 94.3 fL (ref 79.5–101.0)
MONO%: 7.7 % (ref 0.0–14.0)
RBC: 3.47 10*6/uL — ABNORMAL LOW (ref 3.70–5.45)
RDW: 13.3 % (ref 11.2–14.5)
lymph#: 0.7 10*3/uL — ABNORMAL LOW (ref 0.9–3.3)

## 2012-09-30 NOTE — Telephone Encounter (Signed)
ENCOUNTER OPENED IN ERROR

## 2012-09-30 NOTE — Progress Notes (Signed)
OFFICE PROGRESS NOTE   09/30/2012   Physicians:P.Kwaitkowski, F.Bertha Stakes, A.Voytek, M.Martin, [facility], B.Crenshaw   INTERVAL HISTORY:  Patient is seen, alone for visit, in scheduled follow up of her iron deficiency anemia, porocarcinoma involving LUE, history of ovarian carcinoma and remote history of node postitve left breast cancer.   Patient was diagnosed with porocarcinoma involving 3 areas left hand and arm in spring 2012 by Dr.Lupton. She had re-excisions of all areas (dorsum left hand, 2 on left forearm and left arm) by Dr Herbie Saxon. She had significant lymphedema LUE after those procedures, with history of left breast cancer with 20 node left axillary dissection in 1994 followed by radiation. Repeat scans at [facility] in 05-2012 reportedly had no other distant disease. Because of progressive in transit mets, she had hyperthermic limb infusion by Dr Lenis Noon at [facility] in 06-2012 (chemotherapeutic agent not in notes available to me); she also had excision of larger area on dorsum of left hand in past few months. She tells me that she had significant swelling of LUE after the limb procedure, which has improved. I believe that she saw Dr Lenis Noon last in October and is to see him again in early January. Patient tells me that there has been no progression in the multiple tiny in transit lesions LUE since the limb procedure; I confirmed this with her again after the visit by phone as I reviewed Dr Chelsea Primus note from 08-17-12.    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 She saw Dr Yolande Jolly in June 2013 and has year follow up with him. Her left breast cancer was T3N1 (2 of 20 nodes) ER+ in 1994, treated with mastectomy and axillary nodes, adriamycin/cytoxan x4, RT, and tamoxifen x 5 years. Last mammograms were at [facility] 05-12-12; she would like to have mammograms done now at [facility]   She has been slightly anemic, with iron  studies low since at least Oct 2012, with normal B12 and folate and normal colonoscopy in March 2012 by  GI. She felt that daily oral iron (ferrous gluconate) was causing diarrhea, but has tolerated this at least a few times weekly; she does take this on empty stomach with OJ.   Patient reports that she is feeling much better overall recently. As above, she denies any new or increased skin lesions since the limb procedure in August. She has had no recent fever or symptoms of infection, has had flu vaccine. She has had no change in right breast or left mastectomy area, no noted bleeding, no shortness or breath. Back pain from nonmalignant compression fractures only bothersome now when she stands for long time. Appetite is good now. Remainder of 10 point Review of Systems negative.  Objective:  Vital signs in last 24 hours:  BP 133/76  Pulse 84  Temp 97 F (36.1 C) (Oral)  Resp 20  Ht 5' 2.5" (1.588 m)  Wt 102 lb 12.8 oz (46.63 kg)  BMI 18.50 kg/m2 Ambulatory without assistance, respirations not labored, looks comfortable.  HEENT:PERRLA, sclera clear, anicteric, oropharynx clear, no lesions and neck supple with midline trachea LymphaticsCervical, supraclavicular, and axillary nodes normal. No inguinal adenopathy Resp: clear to auscultation bilaterally and normal percussion bilaterally Cardio: regular rate and rhythm GI: soft, non-tender; bowel sounds normal; no masses,  no organomegaly Extremities: no edema, cords, tenderness RUE or LE bilaterally. LUE with 2+ edema mostly in forearm. Scar well-healed on dorsum of hand. Clustered in transit lesions  2-3 mm upper forearm and medially above elbow, all hard, nontender.  Right breast without dominant mass, skin or nipple findings and axilla benign. Left mastectomy scar without evidence of local recurrence, nothing palpable left axilla.  Lab Results:  Results for orders placed in visit on 09/30/12  CBC WITH DIFFERENTIAL      Component  Value Range   WBC 5.0  3.9 - 10.3 10e3/uL   NEUT# 3.7  1.5 - 6.5 10e3/uL   HGB 11.4 (*) 11.6 - 15.9 g/dL   HCT 16.1 (*) 09.6 - 04.5 %   Platelets 140 (*) 145 - 400 10e3/uL   MCV 94.3  79.5 - 101.0 fL   MCH 32.8  25.1 - 34.0 pg   MCHC 34.8  31.5 - 36.0 g/dL   RBC 4.09 (*) 8.11 - 9.14 10e6/uL   RDW 13.3  11.2 - 14.5 %   lymph# 0.7 (*) 0.9 - 3.3 10e3/uL   MONO# 0.4  0.1 - 0.9 10e3/uL   Eosinophils Absolute 0.1  0.0 - 0.5 10e3/uL   Basophils Absolute 0.0  0.0 - 0.1 10e3/uL   NEUT% 75.4  38.4 - 76.8 %   LYMPH% 13.3 (*) 14.0 - 49.7 %   MONO% 7.7  0.0 - 14.0 %   EOS% 2.7  0.0 - 7.0 %   BASO% 0.9  0.0 - 2.0 %  This Hgb is up from 10.9 in 03-2012, platelets down from 155/ 160k   CMET available after visit normal with exception of T bili stable at 1.3 and T protein 6.2, which is improved.  Iron is much better at 79 with 27% sat and ferritin is 44 Studies/Results: Right mammogram from [facility] 05-10-12 heterogeneously dense tissue without mammographic findings of concern. As above, we will schedule further mammograms at [facility], which is much closer to patient's home.  Medications: I have reviewed the patient's current medications. She has had flu vaccine.  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 Patient does not feel there has been any progression of lesions, but knows to be in touch with physicians involved if so.  2.iron deficiency anemia improved.  3.T3N1 left breast cancer 1994 post multidisciplinary treatment and not known active.Mammograms June 2014 in [place].Marland Kitchen  4.lymphedema LUE  5.high grade ovarian carcinoma as above, following with gyn oncology yearly.  6. Osteoporosis with compression fractures last year, on calcium + D 7. Slightly lower platelet count, not symptomatic, will follow.   Leonel Mccollum P, MD   09/30/2012, 9:42 AM

## 2012-09-30 NOTE — Telephone Encounter (Signed)
gv and printed pt appt schedule for Jan,Feb, and June 2014...the patient wanted to change mammo facility from [facility] to [facility]...scheduled mammo with Bonita Quin for June 27th 2014 @ 9:00 am...the patient aware

## 2012-09-30 NOTE — Patient Instructions (Signed)
Next mammograms will be set up at [facility]  Let Dr Darrold Span know if the skin cancer areas are worse before next scheduled visit

## 2012-10-03 ENCOUNTER — Telehealth: Payer: Self-pay

## 2012-10-03 NOTE — Telephone Encounter (Signed)
Message copied by Lorine Bears on Mon Oct 03, 2012  2:08 PM ------      Message from: Reece Packer      Created: Sat Oct 01, 2012  7:38 PM       Labs seen and need follow up: please let her know iron levels also are better. How often is she taking iron now? Which type?

## 2012-10-03 NOTE — Telephone Encounter (Signed)
Spoke with Judy Herrera and told her iron level was better per Dr. Darrold Span as noted below. Ms. Dreese is taking ferrous gluconate ~324 mg 2 tabs on an empty stomach with OJ on Monday and Friday.

## 2012-10-04 NOTE — Progress Notes (Deleted)
ENCOUNTER OPENED IN ERROR

## 2012-11-15 ENCOUNTER — Emergency Department
Admission: EM | Admit: 2012-11-15 | Discharge: 2012-11-15 | Disposition: A | Discharge status: Home / Self Care | Payer: Medicare Other | Source: Home / Self Care | Attending: Family Medicine | Admitting: Family Medicine

## 2012-11-15 ENCOUNTER — Encounter: Payer: Self-pay | Admitting: Emergency Medicine

## 2012-11-15 DIAGNOSIS — N3000 Acute cystitis without hematuria: Secondary | ICD-10-CM

## 2012-11-15 LAB — POCT URINALYSIS DIP (MANUAL ENTRY)
Bilirubin, UA: NEGATIVE
Glucose, UA: NEGATIVE
Nitrite, UA: POSITIVE
Urobilinogen, UA: 1 (ref 0–1)

## 2012-11-15 MED ORDER — CEPHALEXIN 500 MG PO CAPS: 500.0000 mg | ORAL_CAPSULE | Freq: Two times a day (BID) | ORAL | Status: DC

## 2012-11-15 NOTE — ED Notes (Signed)
Dysuria started yesterday 

## 2012-11-15 NOTE — ED Provider Notes (Signed)
History     CSN: [id number]  Arrival date & time 11/15/12  0907   First MD Initiated Contact with Patient 11/15/12 1033      Chief Complaint  Patient presents with  . Dysuria      HPI Comments: Patient developed dysuria yesterday afternoon.  Her symptoms improved somewhat with Azo.  Patient is a 78 y.o. female presenting with dysuria. The history is provided by the patient.  Dysuria  This is a new problem. The current episode started yesterday. The problem occurs every urination. The problem has not changed since onset.The quality of the pain is described as burning. The pain is mild. There has been no fever. Associated symptoms include frequency. Pertinent negatives include no chills, no sweats, no nausea, no vomiting, no discharge, no hematuria, no hesitancy, no urgency and no flank pain. Treatments tried: Azo.    Past Medical History  Diagnosis Date  . Hypertension   . Allergy   . Cancer   . Ovarian ca   . Cataract   . Breast CA   . CAD (coronary artery disease)   . Hyperlipidemia   . Cardiomyopathy     improved  . Arthritis   . Osteoporosis     Past Surgical History  Procedure Date  . Left breast mastectomy 1994    T3 N1 Mx  . Shoulder arthroscopy   . Right leg   . Shoulder surgery     replacement  . Modified radical mastectomy w/ axillary lymph node dissection w/ pectoralis minor excision 1994    Left  . Eye surgery     CATARACTS  . Abdominal hysterectomy 2004  . Cholecystectomy 1993  . Breast surgery     Family History  Problem Relation Age of Onset  . Breast cancer Sister   . Cancer Sister   . Heart disease Sister   . Heart disease Father   . Heart disease Mother     History  Substance Use Topics  . Smoking status: Never Smoker   . Smokeless tobacco: Not on file  . Alcohol Use: No    OB History    Grav Para Term Preterm Abortions TAB SAB Ect Mult Living                  Review of Systems  Constitutional: Negative for chills.    Gastrointestinal: Negative for nausea and vomiting.  Genitourinary: Positive for dysuria and frequency. Negative for hesitancy, urgency, hematuria and flank pain.  All other systems reviewed and are negative.    Allergies  Ibuprofen and Morphine  Home Medications   Current Outpatient Rx  Name  Route  Sig  Dispense  Refill  . ACYCLOVIR 400 MG PO TABS      TAKE 1/2 TABLET 3 TIMES DAILY FOR FEVER BLISTERS   45 tablet   4   . AMLODIPINE BESYLATE 2.5 MG PO TABS      TAKE 1 TABLET DAILY.   30 tablet   5   . ASPIRIN 81 MG PO TABS   Oral   Take 81 mg by mouth daily.           Marland Kitchen CALCIUM + D PO   Oral   Take 600 mg by mouth 2 (two) times daily.          Marland Kitchen CARVEDILOL 25 MG PO TABS   Oral   Take 1 tablet (25 mg total) by mouth 2 (two) times daily with a meal.   180 tablet  5     Needs to be seen for future refills - last seen 1/ ...   . CEPHALEXIN 500 MG PO CAPS   Oral   Take 1 capsule (500 mg total) by mouth 2 (two) times daily.   10 capsule   0   . VITAMIN D 2000 UNITS PO TABS   Oral   Take 2,000 Units by mouth daily.         . CO Q 10 100 MG PO CAPS   Oral   Take 1 capsule by mouth daily.         . DENOSUMAB 60 MG/ML [place] SOLN   Subcutaneous   Inject 60 mg into the skin every 6 (six) months. Administer in upper arm, thigh, or abdomen         . FERROUS GLUCONATE 324 (38 FE) MG PO TABS      Takes 2 tabs on empty stomach with OJ on Monday and Friday.         Marland Kitchen HYDROCODONE-ACETAMINOPHEN 5-325 MG PO TABS   Oral   Take 1 tablet by mouth every 6 (six) hours as needed.   60 tablet   2   . LISINOPRIL 20 MG PO TABS   Oral   Take 1 tablet (20 mg total) by mouth daily.   90 tablet   4     Needs to be seen for future refills- last seen 1/2 ...   . POLYETHYLENE GLYCOL 3350 PO PACK   Oral   Take 17 g by mouth 2 (two) times daily as needed.           Marland Kitchen SIMVASTATIN 40 MG PO TABS   Oral   Take 1 tablet (40 mg total) by mouth at bedtime.   90  tablet   4     Must be seen for refills- last seen 11/2010   . TRAMADOL HCL 50 MG PO TABS   Oral   Take 1 tablet (50 mg total) by mouth every 6 (six) hours as needed for pain.   60 tablet   4     Must be seen for future refills- last seen 11/2010     BP 112/69  Pulse 69  Temp 97.9 F (36.6 C) (Oral)  Resp 12  Ht 5\' 2"  (1.575 m)  Wt 99 lb (44.906 kg)  BMI 18.11 kg/m2  SpO2 98%  Physical Exam Nursing notes and Vital Signs reviewed. Appearance:  Patient appears healthy, stated age, and in no acute distress Eyes:  Pupils are equal, round, and reactive to light and accomodation.  Extraocular movement is intact.  Conjunctivae are not inflamed   Nose:   Normal  Pharynx:  Normal Neck:  Supple.  No adenopathy  Lungs:  Clear to auscultation.  Breath sounds are equal.  Heart:  Regular rate and rhythm without murmurs, rubs, or gallops.  Abdomen:   Mild suprapubic tenderness without masses or hepatosplenomegaly.  Bowel sounds are present.  No CVA or flank tenderness.  Extremities:  No edema.  No calf tenderness Skin:  No rash present.   ED Course  Procedures none   Labs Reviewed  POCT URINALYSIS DIP (MANUAL ENTRY) trace BLO; positive NIT; large LEU      1. Acute cystitis       MDM  Chart reviewed; previous urine culture 09/18/10 revealed e coli sensitive to cefazolin Urine culture pending.  Begin Keflex. Continue increased fluid intake.  May continue Azo for two days.  Return for persistent  fever or worsening symptoms Followup with Family Doctor if not improved in one week.  Discussed red flags.        Lattie Haw, MD 11/15/12 [phone]

## 2012-11-17 ENCOUNTER — Telehealth: Payer: Self-pay | Admitting: *Deleted

## 2012-11-17 LAB — URINE CULTURE: Colony Count: >=100000

## 2012-12-01 ENCOUNTER — Telehealth: Payer: Self-pay | Admitting: Oncology

## 2012-12-01 NOTE — Telephone Encounter (Signed)
pt lm on 1/15 to cx her 1/20 appt as she stated that dr Lenis Noon is  sending her to dr savage for a 2nd op and she will c/b       anne

## 2012-12-05 ENCOUNTER — Other Ambulatory Visit: Payer: Medicare Other | Admitting: Lab

## 2012-12-05 ENCOUNTER — Ambulatory Visit: Payer: Medicare Other | Admitting: Oncology

## 2012-12-23 ENCOUNTER — Telehealth: Payer: Self-pay

## 2012-12-23 ENCOUNTER — Other Ambulatory Visit: Payer: Self-pay | Admitting: Oncology

## 2012-12-23 NOTE — Telephone Encounter (Signed)
Judy Herrera is receiving Cycle 1 D1 Gemzar today at Judy Herrera office.  Judy Herrera would like to receive chemotherapy at [facility]. She needs appointment for Cycle 1 D8 Gemzar for 12-30-12. Information from visit with chemotherapy doseing received from Dr. Lendon Colonel' office,  Information given to Dr. Darrold Span to review.   Dr. Precious Reel office needs to call patient with appointments.

## 2012-12-24 ENCOUNTER — Other Ambulatory Visit: Payer: Self-pay | Admitting: Oncology

## 2012-12-26 ENCOUNTER — Telehealth: Payer: Self-pay | Admitting: Oncology

## 2012-12-26 ENCOUNTER — Other Ambulatory Visit: Payer: Self-pay

## 2012-12-26 ENCOUNTER — Other Ambulatory Visit: Payer: Self-pay | Admitting: Oncology

## 2012-12-26 DIAGNOSIS — C4499 Other specified malignant neoplasm of skin, unspecified: Secondary | ICD-10-CM

## 2012-12-26 NOTE — Telephone Encounter (Signed)
Spoke with Judy Herrera.  She stated that the nurse at Dr. Delight Ovens office reviewed written information given to her  for Gemzar and Taxotere. Will review with Judy Herrera at visit 12-30-12. Will send in prescription for decadron for taxotere and nausea medication. PAC placement set up for 01-02-13 at 0730 per Tina in [facility] IR scheduling. Judy Herrera did not receive D1 of chemotherapy on 12-23-12 as her insurance coverage needed to be verified.  Will give drug information to [facility] in [facility] managed care to determine authorization for coverage of chemotherapy agents. Judy Herrera aware of lab and vist with Dr. Darrold Span this Friday 12-30-12 at 1330.

## 2012-12-26 NOTE — Telephone Encounter (Signed)
s/w pt and gv 2.14.14 appt schedule....pt ok

## 2012-12-27 ENCOUNTER — Telehealth: Payer: Self-pay

## 2012-12-27 ENCOUNTER — Encounter (HOSPITAL_COMMUNITY): Payer: Self-pay | Admitting: Pharmacy Technician

## 2012-12-27 ENCOUNTER — Other Ambulatory Visit: Payer: Self-pay

## 2012-12-27 DIAGNOSIS — C4499 Other specified malignant neoplasm of skin, unspecified: Secondary | ICD-10-CM

## 2012-12-27 MED ORDER — DEXAMETHASONE 4 MG PO TABS: ORAL_TABLET | ORAL | Status: DC

## 2012-12-27 MED ORDER — LORAZEPAM 0.5 MG PO TABS: ORAL_TABLET | ORAL | Status: DC

## 2012-12-27 MED ORDER — ONDANSETRON HCL 8 MG PO TABS: 8.0000 mg | ORAL_TABLET | Freq: Three times a day (TID) | ORAL | Status: DC | PRN

## 2012-12-27 NOTE — Telephone Encounter (Signed)
Received call and fax with Authorization number for Gemzar- #[id number] from Dr. Lin Givens.  Exp:03-23-13.  Taxol did not need prior authorization.  Authorization information given to Thelma Barge in managed care.  Copy sent to HIM to be scanned into EMR.

## 2012-12-30 ENCOUNTER — Other Ambulatory Visit: Payer: Self-pay | Admitting: Radiology

## 2012-12-30 ENCOUNTER — Ambulatory Visit (HOSPITAL_BASED_OUTPATIENT_CLINIC_OR_DEPARTMENT_OTHER): Payer: Medicare Other | Admitting: Oncology

## 2012-12-30 ENCOUNTER — Telehealth: Payer: Self-pay | Admitting: Oncology

## 2012-12-30 ENCOUNTER — Other Ambulatory Visit (HOSPITAL_BASED_OUTPATIENT_CLINIC_OR_DEPARTMENT_OTHER): Payer: Medicare Other | Admitting: Lab

## 2012-12-30 ENCOUNTER — Encounter: Payer: Self-pay | Admitting: Oncology

## 2012-12-30 VITALS — BP 138/68 | HR 68 | Temp 98.0°F | Resp 18 | Ht 62.0 in | Wt 100.2 lb

## 2012-12-30 DIAGNOSIS — R609 Edema, unspecified: Secondary | ICD-10-CM

## 2012-12-30 DIAGNOSIS — Z853 Personal history of malignant neoplasm of breast: Secondary | ICD-10-CM

## 2012-12-30 DIAGNOSIS — C4499 Other specified malignant neoplasm of skin, unspecified: Secondary | ICD-10-CM

## 2012-12-30 DIAGNOSIS — C44601 Unspecified malignant neoplasm of skin of unspecified upper limb, including shoulder: Secondary | ICD-10-CM

## 2012-12-30 DIAGNOSIS — C569 Malignant neoplasm of unspecified ovary: Secondary | ICD-10-CM

## 2012-12-30 LAB — CBC WITH DIFFERENTIAL/PLATELET
BASO%: 1.3 % (ref 0.0–2.0)
Eosinophils Absolute: 0.1 10*3/uL (ref 0.0–0.5)
MCHC: 34.6 g/dL (ref 31.5–36.0)
MONO#: 0.3 10*3/uL (ref 0.1–0.9)
NEUT#: 2.1 10*3/uL (ref 1.5–6.5)
Platelets: 141 10*3/uL — ABNORMAL LOW (ref 145–400)
RBC: 3.38 10*6/uL — ABNORMAL LOW (ref 3.70–5.45)
RDW: 13.8 % (ref 11.2–14.5)
WBC: 3.5 10*3/uL — ABNORMAL LOW (ref 3.9–10.3)
lymph#: 0.8 10*3/uL — ABNORMAL LOW (ref 0.9–3.3)

## 2012-12-30 LAB — COMPREHENSIVE METABOLIC PANEL (CC13)
ALT: 12 U/L (ref 0–55)
Albumin: 3.3 g/dL — ABNORMAL LOW (ref 3.5–5.0)
CO2: 29 mEq/L (ref 22–29)
Calcium: 8.9 mg/dL (ref 8.4–10.4)
Chloride: 101 mEq/L (ref 98–107)
Glucose: 113 mg/dl — ABNORMAL HIGH (ref 70–99)
Potassium: 3.8 mEq/L (ref 3.5–5.1)
Sodium: 139 mEq/L (ref 136–145)
Total Bilirubin: 1.08 mg/dL (ref 0.20–1.20)
Total Protein: 6.1 g/dL — ABNORMAL LOW (ref 6.4–8.3)

## 2012-12-30 NOTE — Progress Notes (Signed)
OFFICE PROGRESS NOTE   12/30/2012   Physicians: P.Kwaitkowski, P.Drusilla Kanner, F.Lupton, B.Crenshaw, M.Martin, [facility]  INTERVAL HISTORY:   Patient is seen, alone for visit, as she has requested chemotherapy be given in [place], this for porocarcinoma involving LUE. I have known her for years since node postitve breast cancer and subsequent early stage ovarian cancer, neither known active now. She has 6 month visit with PCP Dr Frederica Kuster in late Feb and is to see Dr Yolande Jolly in June.  Patient was diagnosed with porocarcinoma involving 3 areas left hand and arm in spring 2012 by Dr.Lupton. She had re-excisions of all areas (dorsum left hand, 2 on left forearm and left arm) by Dr Herbie Saxon. She had significant lymphedema LUE after those procedures, with history of left breast cancer with 20 node left axillary dissection in 1994 followed by radiation. Repeat scans at [facility] in 05-2012 reportedly had no other distant disease. Because of progressive in transit mets, she had hyperthermic limb infusion by Dr Lenis Noon at [facility] in 06-2012 (chemotherapeutic agent not in notes available to me); she also had excision of an area on dorsum of left hand in Oct 2013. She had significant swelling of LUE after the limb perfusion, which gradually improved.She had progression of the in transit mets LUE since the limb perfusion, which are not symptomatic. She was seen in consultation by Dr Orvilla Fus at [facility], with recommendation for gemzar/taxotere as systemic sarcoma regimen, chosen in part due to her prior chemotherapy regimens. [facility] notes report no evidence of disease on CXR there on 11-23-2012, and their last CT CAP 06-15-12 no distant disease; she has not had more recent imaging in [facility] system. She did not receive day 1 cycle 1 gemzar as planned at [facility] last week due to need for precertification; my office is in process of getting the precert changed to our facitily. Tho she did not  use central catheter for previous chemotherapy, with prior chemo, risk of gemzar irritation and as access is limited to just RUE, we have made arrangements for [facility] to be placed by IR on 01-02-13. Patient had teaching for gemzar/ taxotere at [facility] and RN has reviewed here today. We plan to begin treatment after PAC is placed next week.    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 She saw Dr Yolande Jolly in June 2013 and has year follow up with him.  Her left breast cancer was T3N1 (2 of 20 nodes) ER+ in 1994, treated with mastectomy and axillary nodes, adriamycin/cytoxan x4, RT, and tamoxifen x 5 years. Last mammograms were at [facility] 05-12-12; she would like to have mammograms done now at [facility].  .   No new or different problems. Energy and appetite at baseline. No pain or increased swelling LUE. No SOB or cough. No other pain. No recent fever or symptoms of infection. Only slight swelling LUE now, mostly around elbow. No noted changes right breast or left mastectomy scan. No abdominal or pelvic symptoms. No swelling LE. Patient cannot tell any changes in multiple in transit lesions LUE. Bowels ok. Remainder of 10 point Review of Systems negative.  Objective:  Vital signs in last 24 hours:  BP 138/68  Pulse 68  Temp(Src) 98 F (36.7 C) (Oral)  Resp 18  Ht 5\' 2"  (1.575 m)  Wt 100 lb 3.2 oz (45.45 kg)  BMI 18.32 kg/m2   Weight is down 2.5 lbs from Nov. Alert, easily ambulatory,  NAD, very pleasant as always.  HEENT:PERRLA, extra ocular movement intact, sclera clear, anicteric and oropharynx clear, no lesions. Normal hair pattern. LymphaticsCervical, supraclavicular, and axillary nodes normal. No inguinal adenopathy. Resp: clear to auscultation bilaterally and normal percussion bilaterally Cardio: regular rate and rhythm GI: soft, non-tender; bowel sounds normal; no masses,  no organomegaly Extremities: RUE and bilateral LE  without edema, cords, tenderness LUE with at least 18 scattered in transit lesions on forearm, most 1-3 mm and one more superiorly 5 mm diameter, also several areas up to 5-7 m above elbow. No apparent swelling now in left hand or wrist, and 1+ around elbow without erythema or heat. None of the areas are tender. Neuro: nonfocal Breasts: right without dominant mass, skin or nipple findings. Left mastectomy scar without evidence of local recurrence. Nothing palpable either axilla. Skin otherwise without rash or ecchymosis  Lab Results:  Results for orders placed in visit on 12/30/12  CBC WITH DIFFERENTIAL      Result Value Range   WBC 3.5 (*) 3.9 - 10.3 10e3/uL   NEUT# 2.1  1.5 - 6.5 10e3/uL   HGB 10.9 (*) 11.6 - 15.9 g/dL   HCT 78.2 (*) 95.6 - 21.3 %   Platelets 141 (*) 145 - 400 10e3/uL   MCV 93.3  79.5 - 101.0 fL   MCH 32.3  25.1 - 34.0 pg   MCHC 34.6  31.5 - 36.0 g/dL   RBC 0.86 (*) 5.78 - 4.69 10e6/uL   RDW 13.8  11.2 - 14.5 %   lymph# 0.8 (*) 0.9 - 3.3 10e3/uL   MONO# 0.3  0.1 - 0.9 10e3/uL   Eosinophils Absolute 0.1  0.0 - 0.5 10e3/uL   Basophils Absolute 0.0  0.0 - 0.1 10e3/uL   NEUT% 61.0  38.4 - 76.8 %   LYMPH% 24.2  14.0 - 49.7 %   MONO% 9.9  0.0 - 14.0 %   EOS% 3.6  0.0 - 7.0 %   BASO% 1.3  0.0 - 2.0 %  COMPREHENSIVE METABOLIC PANEL (CC13)      Result Value Range   Sodium 139  136 - 145 mEq/L   Potassium 3.8  3.5 - 5.1 mEq/L   Chloride 101  98 - 107 mEq/L   CO2 29  22 - 29 mEq/L   Glucose 113 (*) 70 - 99 mg/dl   BUN 62.9  7.0 - 52.8 mg/dL   Creatinine 1.1  0.6 - 1.1 mg/dL   Total Bilirubin 4.13  0.20 - 1.20 mg/dL   Alkaline Phosphatase 46  40 - 150 U/L   AST 14  5 - 34 U/L   ALT 12  0 - 55 U/L   Total Protein 6.1 (*) 6.4 - 8.3 g/dL   Albumin 3.3 (*) 3.5 - 5.0 g/dL   Calcium 8.9  8.4 - 24.4 mg/dL    Last iron studies in this system were in Nov 2013 with serum iron 79, %sat 27 and ferritin 44  Studies/Results:  Plan is for repeat body CTs with visit to Dr  Lin Givens after third cycle of gem/taxotere  Medications: I have reviewed the patient's current medications. Patient has been instructed about steroids x 3 days around taxotere and antiemetics sent to pharmacy I need to clarify doses of chemotherapy with Dr Lin Givens, however gemzar is stated 1000 mg/m2 day 1 in note from 12-23-12; I have found reports of gemzar for soft tissue sarcoma also 600 - 800 - and 900 mg/m2 and taxotere at 60/m2, [employer]  note may indicate taxotere at 100 mg/m2.  She will need neulasta on day 9.  We have discussed gemzar/ taxotere chemotherapy and other plan as above. Patient is in agreement with this and with the [facility] placement. She is always very cooperative and motivated, but still is rather frail and has had two prior chemo regimens for other cancers.   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 2. Mild anemia: iron deficiency in 2012, with iron studies improved recently. She is on oral iron ~ twice weekly. Previous B12 and folate ok. Negative colonoscopy march 2012. 3.history of node positive left breast cancer: history as above, not known recurrent. Has had problems with swelling LUE following procedures for the porocarcinoma, in part related to 20 node axillary dissection and RT for the breast cancer. Mammograms likely in [facility] June 2014 (previously [place]) 4.history of high grade 1A ovarian carcinoma: history as above, not known recurrent, followed by gyn oncology yearly now. 5.inadequate peripheral IV access for planned chemotherapy: PAC by IR 01-02-13. 6.osteoporosis with vertebral compression fracture 2012 7. Cardiomyopathy out of proportion to CAD at cath 2006, normal LVF by echo March 2012. LBBB. Elevated lipids. Known to Dr Jens Som. 8. Flu vaccine done    Oliveah Zwack P, MD   12/30/2012, 3:20 PM

## 2012-12-30 NOTE — Patient Instructions (Signed)
We will begin chemo on Feb 19 with gemzar that day, then Feb 26 gemzar + taxotere.  You may need to take tylenol for a day or so after gemzar, as this can sometimes give fever or virus symptoms. It would be fine to take ativan (lorazepam) 1/2 or 1 of the 0.5 mg tablets at bedtime night of chemo and one of the zofran (ondansetron) morning after chemo even if no nausea, to cover a bit longer; you can use the nausea medicine in addition to these doses according to instructions on bottles.  You will take steroid decadron (dexamethasone) for 3 days around each dose of taxotere. The dose will be 2 tablets (=8 mg) with food morning and late afternoon day before, day of and day after taxotere. So you will take steroid Feb 25-26-27, twice daily, each dose with food.   Call any time if questions or concerns   [phone]

## 2013-01-02 ENCOUNTER — Other Ambulatory Visit: Payer: Self-pay | Admitting: Oncology

## 2013-01-02 ENCOUNTER — Ambulatory Visit (HOSPITAL_COMMUNITY)
Admission: RE | Admit: 2013-01-02 | Discharge: 2013-01-02 | Disposition: A | Discharge status: Home / Self Care | Payer: Medicare Other | Source: Ambulatory Visit | Attending: Oncology | Admitting: Oncology

## 2013-01-02 ENCOUNTER — Encounter (HOSPITAL_COMMUNITY): Payer: Self-pay

## 2013-01-02 DIAGNOSIS — C4499 Other specified malignant neoplasm of skin, unspecified: Secondary | ICD-10-CM | POA: Insufficient documentation

## 2013-01-02 LAB — CBC WITH DIFFERENTIAL/PLATELET
Basophils Absolute: 0 10*3/uL (ref 0.0–0.1)
Basophils Relative: 0 % (ref 0–1)
Eosinophils Absolute: 0.1 10*3/uL (ref 0.0–0.7)
MCH: 31.9 pg (ref 26.0–34.0)
MCHC: 33.8 g/dL (ref 30.0–36.0)
Neutro Abs: 2.4 10*3/uL (ref 1.7–7.7)
Neutrophils Relative %: 66 % (ref 43–77)
RDW: 13 % (ref 11.5–15.5)

## 2013-01-02 LAB — APTT: aPTT: 29 seconds (ref 24–37)

## 2013-01-02 LAB — PROTIME-INR: INR: 0.98 (ref 0.00–1.49)

## 2013-01-02 MED ORDER — CEFAZOLIN SODIUM 1-5 GM-% IV SOLN
1.0000 g | Freq: Once | INTRAVENOUS | Status: AC
  Administered 2013-01-02: 1 g via INTRAVENOUS
  Filled 2013-01-02: qty 50

## 2013-01-02 MED ORDER — HEPARIN SOD (PORK) LOCK FLUSH 100 UNIT/ML IV SOLN
500.0000 [IU] | Freq: Once | INTRAVENOUS | Status: AC
  Administered 2013-01-02: 500 [IU] via INTRAVENOUS

## 2013-01-02 MED ORDER — LIDOCAINE HCL 1 % IJ SOLN
INTRAMUSCULAR | Status: AC
  Filled 2013-01-02: qty 20

## 2013-01-02 MED ORDER — SODIUM CHLORIDE 0.9 % IV SOLN
INTRAVENOUS | Status: DC
  Administered 2013-01-02: 08:00:00 via INTRAVENOUS

## 2013-01-02 MED ORDER — FENTANYL CITRATE 0.05 MG/ML IJ SOLN
INTRAMUSCULAR | Status: AC | PRN
  Administered 2013-01-02 (×2): 50 ug via INTRAVENOUS

## 2013-01-02 MED ORDER — FENTANYL CITRATE 0.05 MG/ML IJ SOLN
INTRAMUSCULAR | Status: AC
  Filled 2013-01-02: qty 2

## 2013-01-02 MED ORDER — MIDAZOLAM HCL 2 MG/2ML IJ SOLN
INTRAMUSCULAR | Status: AC
  Filled 2013-01-02: qty 2

## 2013-01-02 MED ORDER — MIDAZOLAM HCL 2 MG/2ML IJ SOLN
INTRAMUSCULAR | Status: AC | PRN
  Administered 2013-01-02 (×2): 1 mg via INTRAVENOUS

## 2013-01-02 NOTE — Procedures (Signed)
Placement of right jugular portacath.  Tip at SVC/RA junction.  No immediate complication.   

## 2013-01-02 NOTE — H&P (Signed)
Judy Herrera is an 79 y.o. female.   Chief Complaint: "I'm getting a port a cath put in" HPI: Patient with history of porocarcinoma of LUE as well as remote history of breast cancer and ovarian cancer presents today for port a cath placement for chemotherapy.  Past Medical History  Diagnosis Date  . Hypertension   . Allergy   . Cancer   . Ovarian ca   . Cataract   . Breast CA   . CAD (coronary artery disease)   . Hyperlipidemia   . Cardiomyopathy     improved  . Arthritis   . Osteoporosis     Past Surgical History  Procedure Laterality Date  . Left breast mastectomy  1994    T3 N1 Mx  . Shoulder arthroscopy    . Right leg    . Shoulder surgery      replacement  . Modified radical mastectomy w/ axillary lymph node dissection w/ pectoralis minor excision  1994    Left  . Eye surgery      CATARACTS  . Abdominal hysterectomy  2004  . Cholecystectomy  1993  . Breast surgery      Family History  Problem Relation Age of Onset  . Breast cancer Sister   . Cancer Sister   . Heart disease Sister   . Heart disease Father   . Heart disease Mother    Social History:  reports that she has never smoked. She does not have any smokeless tobacco history on file. She reports that she does not drink alcohol or use illicit drugs.  Allergies:  Allergies  Allergen Reactions  . Ibuprofen Nausea Only  . Morphine Nausea And Vomiting    Current outpatient prescriptions:amLODipine (NORVASC) 2.5 MG tablet, Take 2.5 mg by mouth every morning., Disp: , Rfl: ;  aspirin 81 MG tablet, Take 81 mg by mouth at bedtime. , Disp: , Rfl: ;  Calcium Carbonate-Vitamin D (CALCIUM + D PO), Take 600 mg by mouth 2 (two) times daily. , Disp: , Rfl: ;  carvedilol (COREG) 25 MG tablet, Take 1 tablet (25 mg total) by mouth 2 (two) times daily with a meal., Disp: 180 tablet, Rfl: 5 Cholecalciferol (VITAMIN D) 2000 UNITS tablet, Take 2,000 Units by mouth daily., Disp: , Rfl: ;  Coenzyme Q10 (CO Q 10) 100 MG CAPS,  Take 1 capsule by mouth daily., Disp: , Rfl: ;  denosumab (PROLIA) 60 MG/ML SOLN injection, Inject 60 mg into the skin every 6 (six) months. Administer in upper arm, thigh, or abdomen, Disp: , Rfl:  dexamethasone (DECADRON) 4 MG tablet, Take 2 tabs twice a day for 3 days beging the day prior to chemotherapy., Disp: 12 tablet, Rfl: 1;  HYDROcodone-acetaminophen (NORCO/VICODIN) 5-325 MG per tablet, Take 1 tablet by mouth every 6 (six) hours as needed., Disp: 60 tablet, Rfl: 2;  lisinopril (PRINIVIL,ZESTRIL) 20 MG tablet, Take 20 mg by mouth every morning., Disp: , Rfl:  polyethylene glycol (MIRALAX / GLYCOLAX) packet, Take 17 g by mouth 2 (two) times daily as needed.  , Disp: , Rfl: ;  simvastatin (ZOCOR) 40 MG tablet, Take 1 tablet (40 mg total) by mouth at bedtime., Disp: 90 tablet, Rfl: 4;  acyclovir (ZOVIRAX) 400 MG tablet, Take 20 mg by mouth 3 (three) times daily as needed (for fever blisters)., Disp: , Rfl:  ferrous gluconate (FERGON) 324 MG tablet, Take 324 mg by mouth 2 (two) times a week. Takes 2 tabs on empty stomach with OJ on Monday and  Friday., Disp: , Rfl: ;  LORazepam (ATIVAN) 0.5 MG tablet, Take 1 tablet under the tongue or swallow every 6 hours as needed for nausea., Disp: 20 tablet, Rfl: 0;  ondansetron (ZOFRAN) 8 MG tablet, Take 1-2 tablets (8-16 mg total) by mouth every 8 (eight) hours as needed for nausea., Disp: 30 tablet, Rfl: 1 traMADol (ULTRAM) 50 MG tablet, Take 1 tablet (50 mg total) by mouth every 6 (six) hours as needed for pain., Disp: 60 tablet, Rfl: 4 Current facility-administered medications:0.9 %  sodium chloride infusion, , Intravenous, Continuous, Brayton El, PA, Last Rate: 20 mL/hr at 01/02/13 0800;  ceFAZolin (ANCEF) IVPB 1 g/50 mL premix, 1 g, Intravenous, Once, Brayton El, PA Facility-Administered Medications Ordered in Other Encounters: fentaNYL (SUBLIMAZE) 0.05 MG/ML injection, , , , ;  lidocaine (XYLOCAINE) 1 % (with pres) injection, , , , ;  midazolam (VERSED) 2  MG/2ML injection, , , ,    Results for orders placed during the hospital encounter of 01/02/13 (from the past 48 hour(s))  APTT     Status: None   Collection Time    01/02/13  7:55 AM      Result Value Range   aPTT 29  24 - 37 seconds  CBC WITH DIFFERENTIAL     Status: Abnormal   Collection Time    01/02/13  7:55 AM      Result Value Range   WBC 3.6 (*) 4.0 - 10.5 K/uL   RBC 3.57 (*) 3.87 - 5.11 MIL/uL   Hemoglobin 11.4 (*) 12.0 - 15.0 g/dL   HCT 16.1 (*) 09.6 - 04.5 %   MCV 94.4  78.0 - 100.0 fL   MCH 31.9  26.0 - 34.0 pg   MCHC 33.8  30.0 - 36.0 g/dL   RDW 40.9  81.1 - 91.4 %   Platelets 133 (*) 150 - 400 K/uL   Neutrophils Relative 66  43 - 77 %   Neutro Abs 2.4  1.7 - 7.7 K/uL   Lymphocytes Relative 21  12 - 46 %   Lymphs Abs 0.8  0.7 - 4.0 K/uL   Monocytes Relative 9  3 - 12 %   Monocytes Absolute 0.3  0.1 - 1.0 K/uL   Eosinophils Relative 4  0 - 5 %   Eosinophils Absolute 0.1  0.0 - 0.7 K/uL   Basophils Relative 0  0 - 1 %   Basophils Absolute 0.0  0.0 - 0.1 K/uL  PROTIME-INR     Status: None   Collection Time    01/02/13  7:55 AM      Result Value Range   Prothrombin Time 12.9  11.6 - 15.2 seconds   INR 0.98  0.00 - 1.49   No results found.  Review of Systems  Constitutional: Negative for fever and chills.  Respiratory: Negative for cough and shortness of breath.   Cardiovascular: Negative for chest pain.  Gastrointestinal: Negative for nausea, vomiting and abdominal pain.  Musculoskeletal: Positive for back pain.  Neurological: Negative for headaches.   Vitals:   BP 145/66  HR  74  R 16  TEMP 97.9  O2 SATS  100% RA Physical Exam  Constitutional: She is oriented to person, place, and time. She appears well-developed and well-nourished.  Cardiovascular: Normal rate and regular rhythm.   Respiratory: Effort normal and breath sounds normal.  GI: Soft. Bowel sounds are normal. There is no tenderness.  Musculoskeletal: Normal range of motion. She exhibits no  edema.  Neurological: She is  alert and oriented to person, place, and time.     Assessment/Plan Pt with hx of porocarcinoma LUE. Plan is for port a cath placement today for chemotherapy. Details/risks of procedure d/w pt/son with their understanding and consent.  Cadince Hilscher,D KEVIN 01/02/2013, 9:00 AM

## 2013-01-03 ENCOUNTER — Other Ambulatory Visit: Payer: Self-pay | Admitting: Oncology

## 2013-01-03 ENCOUNTER — Encounter: Payer: Self-pay | Admitting: Oncology

## 2013-01-03 NOTE — Progress Notes (Signed)
Medical Oncology documentation  Spoke with [facility] line --  Dr Lin Givens out 2-17 thru 01-04-13. Given his email from [facility] line and have sent communication in this fashion re dosing for gem/taxotere.  WIll give day 1 cycle 1 on  01-04-13 gemzar at 800 mg/m2 per other sources until able to confirm with Dr Lin Givens. Note patient is rather frail and has been treated with chemotherapy for both breast and ovarian cancers previously.  Ila Mcgill, MD

## 2013-01-04 ENCOUNTER — Ambulatory Visit: Payer: Medicare Other

## 2013-01-04 ENCOUNTER — Ambulatory Visit (HOSPITAL_BASED_OUTPATIENT_CLINIC_OR_DEPARTMENT_OTHER): Payer: Medicare Other

## 2013-01-04 VITALS — BP 116/62 | HR 73 | Temp 97.8°F | Resp 20

## 2013-01-04 DIAGNOSIS — C4499 Other specified malignant neoplasm of skin, unspecified: Secondary | ICD-10-CM

## 2013-01-04 DIAGNOSIS — C44601 Unspecified malignant neoplasm of skin of unspecified upper limb, including shoulder: Secondary | ICD-10-CM

## 2013-01-04 DIAGNOSIS — C569 Malignant neoplasm of unspecified ovary: Secondary | ICD-10-CM

## 2013-01-04 DIAGNOSIS — Z5111 Encounter for antineoplastic chemotherapy: Secondary | ICD-10-CM

## 2013-01-04 LAB — CBC WITH DIFFERENTIAL/PLATELET
BASO%: 0.8 % (ref 0.0–2.0)
EOS%: 4.9 % (ref 0.0–7.0)
LYMPH%: 27.8 % (ref 14.0–49.7)
MCH: 32 pg (ref 25.1–34.0)
MCHC: 34.4 g/dL (ref 31.5–36.0)
MONO#: 0.4 10*3/uL (ref 0.1–0.9)
NEUT%: 55.6 % (ref 38.4–76.8)
Platelets: 136 10*3/uL — ABNORMAL LOW (ref 145–400)
RBC: 3.41 10*6/uL — ABNORMAL LOW (ref 3.70–5.45)
WBC: 3.6 10*3/uL — ABNORMAL LOW (ref 3.9–10.3)
lymph#: 1 10*3/uL (ref 0.9–3.3)

## 2013-01-04 MED ORDER — SODIUM CHLORIDE 0.9 % IV SOLN
800.0000 mg/m2 | Freq: Once | INTRAVENOUS | Status: AC
  Administered 2013-01-04: 1102 mg via INTRAVENOUS
  Filled 2013-01-04: qty 29

## 2013-01-04 MED ORDER — ACETAMINOPHEN 325 MG PO TABS
325.0000 mg | ORAL_TABLET | Freq: Once | ORAL | Status: AC
  Administered 2013-01-04: 325 mg via ORAL

## 2013-01-04 MED ORDER — DEXAMETHASONE SODIUM PHOSPHATE 10 MG/ML IJ SOLN
10.0000 mg | Freq: Once | INTRAMUSCULAR | Status: AC
  Administered 2013-01-04: 10 mg via INTRAVENOUS

## 2013-01-04 MED ORDER — ONDANSETRON 8 MG/50ML IVPB (CHCC)
8.0000 mg | Freq: Once | INTRAVENOUS | Status: AC
  Administered 2013-01-04: 8 mg via INTRAVENOUS

## 2013-01-04 NOTE — Patient Instructions (Addendum)
[facility] Discharge Instructions for Patients Receiving Chemotherapy  Today you received the following chemotherapy agents: Gemzar.  To help prevent nausea and vomiting after your treatment, we encourage you to take your nausea medication.  If you develop nausea and vomiting that is not controlled by your nausea medication, call the clinic.    BELOW ARE SYMPTOMS THAT SHOULD BE REPORTED IMMEDIATELY:  *FEVER GREATER THAN 100.5 F  *CHILLS WITH OR WITHOUT FEVER  NAUSEA AND VOMITING THAT IS NOT CONTROLLED WITH YOUR NAUSEA MEDICATION  *UNUSUAL SHORTNESS OF BREATH  *UNUSUAL BRUISING OR BLEEDING  TENDERNESS IN MOUTH AND THROAT WITH OR WITHOUT PRESENCE OF ULCERS  *URINARY PROBLEMS  *BOWEL PROBLEMS  UNUSUAL RASH Items with * indicate a potential emergency and should be followed up as soon as possible.  One of the nurses will contact you 24 hours after your treatment. Please let the nurse know about any problems that you may have experienced. Feel free to call the clinic you have any questions or concerns. The clinic phone number is [phone].

## 2013-01-05 ENCOUNTER — Telehealth: Payer: Self-pay | Admitting: *Deleted

## 2013-01-05 NOTE — Telephone Encounter (Signed)
Spoke with Judy Herrera who is doing well.  Says she is eating well and drinking lots of fluids.  Denies experiencing any side effects or sypmtoms.  No questions at this time.  Has the patient education information handy to refer to if needed.

## 2013-01-05 NOTE — Telephone Encounter (Signed)
Message copied by Augusto Garbe on Thu Jan 05, 2013  2:52 PM ------      Message from: Barbara Cower      Created: Wed Jan 04, 2013  3:08 PM      Regarding: chemo followup call       1st Gemzar            Dr. Darrold Span ------

## 2013-01-09 ENCOUNTER — Ambulatory Visit (HOSPITAL_BASED_OUTPATIENT_CLINIC_OR_DEPARTMENT_OTHER): Payer: Medicare Other | Admitting: Physician Assistant

## 2013-01-09 ENCOUNTER — Encounter: Payer: Self-pay | Admitting: Physician Assistant

## 2013-01-09 ENCOUNTER — Other Ambulatory Visit: Payer: Self-pay | Admitting: Oncology

## 2013-01-09 ENCOUNTER — Telehealth: Payer: Self-pay

## 2013-01-09 VITALS — BP 131/65 | HR 76 | Temp 98.6°F | Resp 18 | Ht 62.0 in | Wt 99.8 lb

## 2013-01-09 DIAGNOSIS — Z8543 Personal history of malignant neoplasm of ovary: Secondary | ICD-10-CM

## 2013-01-09 DIAGNOSIS — C4499 Other specified malignant neoplasm of skin, unspecified: Secondary | ICD-10-CM

## 2013-01-09 DIAGNOSIS — D649 Anemia, unspecified: Secondary | ICD-10-CM

## 2013-01-09 DIAGNOSIS — Z853 Personal history of malignant neoplasm of breast: Secondary | ICD-10-CM

## 2013-01-09 NOTE — Patient Instructions (Addendum)
Return for chemotherapy as scheduled on 01/11/2013 Followup with Dr. Darrold Span. as previously scheduled on 01/18/2013

## 2013-01-09 NOTE — Telephone Encounter (Signed)
Reviewed  Steroids for taxotere.  She is to begin 8 mg bid tomorrow x 3 days.  Pt. Verbalized understanding. She states that she is experiencing a little nausea.  Told her that she can take 2  zofran 8 mg  tabs q 12 hours and use ativan 0.5 mg  sl in between.  She did not realize she could take the ativan for nausea. Told her it would make her sleepy. Patient appreciated the phone call.

## 2013-01-09 NOTE — Progress Notes (Signed)
OFFICE PROGRESS NOTE   01/09/2013   Physicians: P.Kwaitkowski, P.Drusilla Kanner, F.Lupton, B.Crenshaw, M.Martin, [facility]  INTERVAL HISTORY:   Patient is seen, alone for visit, as she has requested chemotherapy be given in [place], this for porocarcinoma involving LUE. Well known to Dr. Darrold Span since node postitve breast cancer and subsequent early stage ovarian cancer, neither known active now. She has 6 month visit with PCP Dr Frederica Kuster in early Mar and is to see Dr Yolande Jolly in June.  Patient was diagnosed with porocarcinoma involving 3 areas left hand and arm in spring 2012 by Dr.Lupton. She had re-excisions of all areas (dorsum left hand, 2 on left forearm and left arm) by Dr Herbie Saxon. She had significant lymphedema LUE after those procedures, with history of left breast cancer with 20 node left axillary dissection in 1994 followed by radiation. Repeat scans at [facility] in 05-2012 reportedly had no other distant disease. Because of progressive in transit mets, she had hyperthermic limb infusion by Dr Lenis Noon at [facility] in 06-2012 (chemotherapeutic agent not in notes available to me); she also had excision of an area on dorsum of left hand in Oct 2013. She had significant swelling of LUE after the limb perfusion, which gradually improved.She had progression of the in transit mets LUE since the limb perfusion, which are not symptomatic. She was seen in consultation by Dr Orvilla Fus at [facility], with recommendation for gemzar/taxotere as systemic sarcoma regimen, chosen in part due to her prior chemotherapy regimens. [facility] notes report no evidence of disease on CXR there on 11-23-2012, and their last CT CAP 06-15-12 no distant disease; she has not had more recent imaging in [facility] system. She did not receive day 1 cycle 1 gemzar as planned at [facility] last week due to need for precertification; my office is in process of getting the precert changed to our facitily. Tho she did not  use central catheter for previous chemotherapy, with prior chemo, risk of gemzar irritation and as access is limited to just RUE, she is status post PAC placed by IR on 01-02-13.  PAC was used for day 1 of chemotherapy on 01/04/13.   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 She saw Dr Yolande Jolly in June 2013 and has year follow up with him.  Her left breast cancer was T3N1 (2 of 20 nodes) ER+ in 1994, treated with mastectomy and axillary nodes, adriamycin/cytoxan x4, RT, and tamoxifen x 5 years. Last mammograms were at [facility] 05-12-12; she would like to have mammograms done now at [facility].  .   She complains of left upper/lateral back pain that began on Thursday. She describes it more as an intermittent spasm. It is well-controlled with her current pain pills. She takes primarily one pain pill at night but may take on during the day if if the back pain is more problematic. She states that this is a different pain than the known lower for T. April collapse back pain she did have some diarrhea and nausea related to the chemotherapy. The diarrhea has been intermittent and is not currently present today. The nausea is episodic as well as well-controlled with her current antiemetics. Energy and appetite at baseline. No pain or increased swelling LUE. No SOB or cough. No other pain. No recent fever or symptoms of infection. Continued slight swelling LUE now, mostly around elbow. No noted changes right breast or left mastectomy scan. No abdominal or pelvic symptoms. No  swelling LE. Patient cannot tell any changes in multiple in transit lesions LUE. Bowels as described above with intermittent diarrhea, no blood noted. Remainder of 10 point Review of Systems negative.  Objective:  Vital signs in last 24 hours:  BP 131/65  Pulse 76  Temp(Src) 98.6 F (37 C) (Oral)  Resp 18  Ht 5\' 2"  (1.575 m)  Wt 99 lb 12.8 oz (45.269 kg)  BMI 18.25  kg/m2   Weight is down 2.5 lbs from Nov. Alert, easily ambulatory, NAD, very pleasant as always.  HEENT:PERRLA, extra ocular movement intact, sclera clear, anicteric and oropharynx clear, no lesions. Normal hair pattern. LymphaticsCervical, supraclavicular, and axillary nodes normal. No inguinal adenopathy. Resp: clear to auscultation bilaterally and normal percussion bilaterally Cardio: regular rate and rhythm GI: soft, non-tender; bowel sounds normal; no masses,  no organomegaly Extremities: RUE and bilateral LE without edema, cords, tenderness LUE with at least 18 scattered in transit lesions on forearm, most 1-3 mm and one more superiorly 5 mm diameter, also several areas up to 5-7 m above elbow. No apparent swelling now in left hand or wrist, and 1+ around elbow without erythema or heat. None of the areas are tender. Neuro: nonfocal Back: There is some soreness to palpation of the left upper mid to lateral rib cage. There are no lesions consistent with herpes zoster, no erythema or excoriations. There are several scattered lesions about the back of varying in pigmentation consistent with seborrheic keratosis type lesions   Lab Results:  Results for orders placed in visit on 01/04/13  CBC WITH DIFFERENTIAL      Result Value Range   WBC 3.6 (*) 3.9 - 10.3 10e3/uL   NEUT# 2.0  1.5 - 6.5 10e3/uL   HGB 10.9 (*) 11.6 - 15.9 g/dL   HCT 16.1 (*) 09.6 - 04.5 %   Platelets 136 (*) 145 - 400 10e3/uL   MCV 93.0  79.5 - 101.0 fL   MCH 32.0  25.1 - 34.0 pg   MCHC 34.4  31.5 - 36.0 g/dL   RBC 4.09 (*) 8.11 - 9.14 10e6/uL   RDW 13.7  11.2 - 14.5 %   lymph# 1.0  0.9 - 3.3 10e3/uL   MONO# 0.4  0.1 - 0.9 10e3/uL   Eosinophils Absolute 0.2  0.0 - 0.5 10e3/uL   Basophils Absolute 0.0  0.0 - 0.1 10e3/uL   NEUT% 55.6  38.4 - 76.8 %   LYMPH% 27.8  14.0 - 49.7 %   MONO% 10.9  0.0 - 14.0 %   EOS% 4.9  0.0 - 7.0 %   BASO% 0.8  0.0 - 2.0 %    Last iron studies in this system were in Nov 2013 with  serum iron 79, %sat 27 and ferritin 44  Studies/Results:  Plan is for repeat body CTs with visit to Dr Lin Givens after third cycle of gem/taxotere  Medications: I have reviewed the patient's current medications.  Patient discussed with Dr. Darrold Span Dr. Darrold Span is been in touch with Dr. Orvilla Fus at [facility] and the dosage for her chemotherapy confirmed. The patient will return as scheduled on 01/11/2013 for day 8 of her chemotherapy with repeat labs at that visit. Currently the patient is not using Emlacream prior to chemotherapy. She will see how things go with the Port-A-Cath access without the Emla cream and let us know if she would like a prescription for the Emla cream in the future.  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 2. Mild anemia: iron deficiency in 2012, with iron studies improved recently. She is on oral iron ~ twice weekly. Previous B12 and folate ok. Negative colonoscopy march 2012. 3.history of node positive left breast cancer: history as above, not known recurrent. Has had problems with swelling LUE following procedures for the porocarcinoma, in part related to 20 node axillary dissection and RT for the breast cancer. Mammograms likely in [facility] June 2014 (previously [place]) 4.history of high grade 1A ovarian carcinoma: history as above, not known recurrent, followed by gyn oncology yearly now. 5.inadequate peripheral IV access status post PAC by IR 01-02-13. 6.osteoporosis with vertebral compression fracture 2012 7. Cardiomyopathy out of proportion to CAD at cath 2006, normal LVF by echo March 2012. LBBB. Elevated lipids. Known to Dr Jens Som. 8. Flu vaccine done    Judy Slipper, PA-C   01/09/2013, 4:50 PM

## 2013-01-11 ENCOUNTER — Other Ambulatory Visit: Payer: Self-pay | Admitting: Oncology

## 2013-01-11 ENCOUNTER — Ambulatory Visit (HOSPITAL_BASED_OUTPATIENT_CLINIC_OR_DEPARTMENT_OTHER): Payer: Medicare Other

## 2013-01-11 ENCOUNTER — Other Ambulatory Visit (HOSPITAL_BASED_OUTPATIENT_CLINIC_OR_DEPARTMENT_OTHER): Payer: Medicare Other | Admitting: Lab

## 2013-01-11 VITALS — BP 169/81 | HR 74 | Temp 97.9°F | Resp 18

## 2013-01-11 DIAGNOSIS — C569 Malignant neoplasm of unspecified ovary: Secondary | ICD-10-CM

## 2013-01-11 DIAGNOSIS — C44601 Unspecified malignant neoplasm of skin of unspecified upper limb, including shoulder: Secondary | ICD-10-CM

## 2013-01-11 DIAGNOSIS — C4499 Other specified malignant neoplasm of skin, unspecified: Secondary | ICD-10-CM

## 2013-01-11 DIAGNOSIS — Z853 Personal history of malignant neoplasm of breast: Secondary | ICD-10-CM

## 2013-01-11 DIAGNOSIS — Z5111 Encounter for antineoplastic chemotherapy: Secondary | ICD-10-CM

## 2013-01-11 LAB — CBC WITH DIFFERENTIAL/PLATELET
BASO%: 0 % (ref 0.0–2.0)
EOS%: 0 % (ref 0.0–7.0)
HCT: 31.2 % — ABNORMAL LOW (ref 34.8–46.6)
MCH: 31.1 pg (ref 25.1–34.0)
MCHC: 34.3 g/dL (ref 31.5–36.0)
MONO#: 0 10*3/uL — ABNORMAL LOW (ref 0.1–0.9)
NEUT%: 89.7 % — ABNORMAL HIGH (ref 38.4–76.8)
RDW: 12.6 % (ref 11.2–14.5)
WBC: 5.4 10*3/uL (ref 3.9–10.3)
lymph#: 0.5 10*3/uL — ABNORMAL LOW (ref 0.9–3.3)
nRBC: 0 % (ref 0–0)

## 2013-01-11 LAB — COMPREHENSIVE METABOLIC PANEL (CC13)
ALT: 13 U/L (ref 0–55)
AST: 12 U/L (ref 5–34)
Albumin: 3.5 g/dL (ref 3.5–5.0)
Alkaline Phosphatase: 58 U/L (ref 40–150)
BUN: 20 mg/dL (ref 7.0–26.0)
Calcium: 9.5 mg/dL (ref 8.4–10.4)
Chloride: 98 mEq/L (ref 98–107)
Potassium: 3.8 mEq/L (ref 3.5–5.1)
Sodium: 135 mEq/L — ABNORMAL LOW (ref 136–145)
Total Protein: 6.5 g/dL (ref 6.4–8.3)

## 2013-01-11 MED ORDER — SODIUM CHLORIDE 0.9 % IV SOLN
650.0000 mg/m2 | Freq: Once | INTRAVENOUS | Status: AC
  Administered 2013-01-11: 912 mg via INTRAVENOUS
  Filled 2013-01-11: qty 24.01

## 2013-01-11 MED ORDER — SODIUM CHLORIDE 0.9 % IV SOLN
Freq: Once | INTRAVENOUS | Status: AC
  Administered 2013-01-11: 14:00:00 via INTRAVENOUS

## 2013-01-11 MED ORDER — SODIUM CHLORIDE 0.9 % IJ SOLN
10.0000 mL | INTRAMUSCULAR | Status: DC | PRN
  Filled 2013-01-11: qty 10

## 2013-01-11 MED ORDER — DOCETAXEL CHEMO INJECTION 160 MG/16ML
60.0000 mg/m2 | Freq: Once | INTRAVENOUS | Status: AC
  Administered 2013-01-11: 80 mg via INTRAVENOUS
  Filled 2013-01-11: qty 8

## 2013-01-11 MED ORDER — HEPARIN SOD (PORK) LOCK FLUSH 100 UNIT/ML IV SOLN
500.0000 [IU] | Freq: Once | INTRAVENOUS | Status: DC | PRN
  Filled 2013-01-11: qty 5

## 2013-01-11 MED ORDER — ACETAMINOPHEN 325 MG PO TABS: 325.0000 mg | ORAL_TABLET | Freq: Once | ORAL | Status: DC

## 2013-01-11 MED ORDER — DEXAMETHASONE SODIUM PHOSPHATE 4 MG/ML IJ SOLN
20.0000 mg | Freq: Once | INTRAMUSCULAR | Status: AC
  Administered 2013-01-11: 20 mg via INTRAVENOUS

## 2013-01-11 MED ORDER — ACETAMINOPHEN 325 MG PO TABS
325.0000 mg | ORAL_TABLET | Freq: Once | ORAL | Status: AC
  Administered 2013-01-11: 325 mg via ORAL

## 2013-01-11 MED ORDER — ONDANSETRON 16 MG/50ML IVPB (CHCC)
16.0000 mg | Freq: Once | INTRAVENOUS | Status: AC
  Administered 2013-01-11: 16 mg via INTRAVENOUS

## 2013-01-11 NOTE — Patient Instructions (Addendum)
[facility] Discharge Instructions for Patients Receiving Chemotherapy  Today you received the following chemotherapy agents taxotere/ gemzar To help prevent nausea and vomiting after your treatment, we encourage you to take your nausea medication  and take it as often as prescribed.   If you develop nausea and vomiting that is not controlled by your nausea medication, call the clinic. If it is after clinic hours your family physician or the after hours number for the clinic or go to the Emergency Department.   BELOW ARE SYMPTOMS THAT SHOULD BE REPORTED IMMEDIATELY:  *FEVER GREATER THAN 100.5 F  *CHILLS WITH OR WITHOUT FEVER  NAUSEA AND VOMITING THAT IS NOT CONTROLLED WITH YOUR NAUSEA MEDICATION  *UNUSUAL SHORTNESS OF BREATH  *UNUSUAL BRUISING OR BLEEDING  TENDERNESS IN MOUTH AND THROAT WITH OR WITHOUT PRESENCE OF ULCERS  *URINARY PROBLEMS  *BOWEL PROBLEMS  UNUSUAL RASH Items with * indicate a potential emergency and should be followed up as soon as possible.  One of the nurses will contact you 24 hours after your treatment. Please let the nurse know about any problems that you may have experienced. Feel free to call the clinic you have any questions or concerns. The clinic phone number is [phone].   I have been informed and understand all the instructions given to me. I know to contact the clinic, my physician, or go to the Emergency Department if any problems should occur. I do not have any questions at this time, but understand that I may call the clinic during office hours   should I have any questions or need assistance in obtaining follow up care.    __________________________________________  _____________  __________ Signature of Patient or Authorized Representative            Date                   Time    __________________________________________ Nurse's Signature

## 2013-01-12 ENCOUNTER — Ambulatory Visit (HOSPITAL_BASED_OUTPATIENT_CLINIC_OR_DEPARTMENT_OTHER): Payer: Medicare Other

## 2013-01-12 ENCOUNTER — Ambulatory Visit: Payer: Medicare Other | Admitting: Internal Medicine

## 2013-01-12 VITALS — BP 135/66 | HR 79 | Temp 98.3°F

## 2013-01-12 DIAGNOSIS — C4499 Other specified malignant neoplasm of skin, unspecified: Secondary | ICD-10-CM

## 2013-01-12 DIAGNOSIS — C569 Malignant neoplasm of unspecified ovary: Secondary | ICD-10-CM

## 2013-01-12 DIAGNOSIS — C44601 Unspecified malignant neoplasm of skin of unspecified upper limb, including shoulder: Secondary | ICD-10-CM

## 2013-01-12 MED ORDER — PEGFILGRASTIM INJECTION 6 MG/0.6ML
6.0000 mg | Freq: Once | SUBCUTANEOUS | Status: AC
  Administered 2013-01-12: 6 mg via SUBCUTANEOUS
  Filled 2013-01-12: qty 0.6

## 2013-01-13 ENCOUNTER — Encounter: Payer: Self-pay | Admitting: Oncology

## 2013-01-13 NOTE — Progress Notes (Signed)
Medical Oncology  [facility] addendum note by Dr Lin Givens for visit there 12-09-12 received, with information as I had already discussed with him by phone. Note to be scanned into this EMR, says : As Judy Herrera has been previously treated with chemotherapy,would favor cycle 1 gemzar 650 mg/m2 days 1,8 and docetaxel at 65 mg/m2 day 8. Escalate as tolerated on subsequent cycles.  Judy Mcgill, MD

## 2013-01-16 ENCOUNTER — Encounter: Payer: Self-pay | Admitting: Internal Medicine

## 2013-01-16 ENCOUNTER — Ambulatory Visit (INDEPENDENT_AMBULATORY_CARE_PROVIDER_SITE_OTHER): Payer: Medicare Other | Admitting: Internal Medicine

## 2013-01-16 VITALS — BP 118/60 | HR 86 | Temp 98.1°F | Resp 20 | Wt 101.0 lb

## 2013-01-16 DIAGNOSIS — I2510 Atherosclerotic heart disease of native coronary artery without angina pectoris: Secondary | ICD-10-CM

## 2013-01-16 DIAGNOSIS — R7302 Impaired glucose tolerance (oral): Secondary | ICD-10-CM

## 2013-01-16 DIAGNOSIS — I10 Essential (primary) hypertension: Secondary | ICD-10-CM

## 2013-01-16 DIAGNOSIS — C4499 Other specified malignant neoplasm of skin, unspecified: Secondary | ICD-10-CM

## 2013-01-16 DIAGNOSIS — R7309 Other abnormal glucose: Secondary | ICD-10-CM

## 2013-01-16 DIAGNOSIS — E785 Hyperlipidemia, unspecified: Secondary | ICD-10-CM

## 2013-01-16 NOTE — Progress Notes (Signed)
Subjective:    Patient ID: Judy Herrera, female    DOB: [birth year], 79 y.o.   MRN: [id number]  HPI   79 year old patient who is seen today for followup. She has a history of hypertension dyslipidemia and coronary artery disease. She is doing quite well. 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 She is receiving chemotherapy. In general doing quite well. Denies any cardiopulmonary complaints. Laboratory studies done last month revealed an elevated random blood sugar of 179. Denies any exertional chest pain.  Past Medical History  Diagnosis Date  . Hypertension   . Allergy   . Cancer   . Ovarian ca   . Cataract   . Breast CA   . CAD (coronary artery disease)   . Hyperlipidemia   . Cardiomyopathy     improved  . Arthritis   . Osteoporosis     History   Social History  . Marital Status: Widowed    Spouse Name: N/A    Number of Children: N/A  . Years of Education: N/A   Occupational History  . Not on file.   Social History Main Topics  . Smoking status: Never Smoker   . Smokeless tobacco: Not on file  . Alcohol Use: No  . Drug Use: No  . Sexually Active: Not on file     Comment: didn't ask   Other Topics Concern  . Not on file   Social History Narrative  . No narrative on file    Past Surgical History  Procedure Laterality Date  . Left breast mastectomy  1994    T3 N1 Mx  . Shoulder arthroscopy    . Right leg    . Shoulder surgery      replacement  . Modified radical mastectomy w/ axillary lymph node dissection w/ pectoralis minor excision  1994    Left  . Eye surgery      CATARACTS  . Abdominal hysterectomy  2004  . Cholecystectomy  1993  . Breast surgery      Family History  Problem Relation Age of Onset  . Breast cancer Sister   . Cancer Sister   . Heart disease Sister   . Heart disease Father   . Heart disease Mother     Allergies  Allergen Reactions  . Ibuprofen Nausea Only  .  Morphine Nausea And Vomiting    Current Outpatient Prescriptions on File Prior to Visit  Medication Sig Dispense Refill  . acyclovir (ZOVIRAX) 400 MG tablet Take 20 mg by mouth 3 (three) times daily as needed (for fever blisters).      Marland Kitchen amLODipine (NORVASC) 2.5 MG tablet Take 2.5 mg by mouth every morning.      Marland Kitchen aspirin 81 MG tablet Take 81 mg by mouth at bedtime.       . Calcium Carbonate-Vitamin D (CALCIUM + D PO) Take 600 mg by mouth 2 (two) times daily.       . carvedilol (COREG) 25 MG tablet Take 1 tablet (25 mg total) by mouth 2 (two) times daily with a meal.  180 tablet  5  . Cholecalciferol (VITAMIN D) 2000 UNITS tablet Take 2,000 Units by mouth daily.      . Coenzyme Q10 (CO Q 10) 100 MG CAPS Take 1 capsule by mouth daily.      Marland Kitchen denosumab (PROLIA) 60 MG/ML SOLN injection Inject 60 mg into the skin every 6 (six) months. Administer in upper arm, thigh,  or abdomen      . dexamethasone (DECADRON) 4 MG tablet Take 2 tabs twice a day for 3 days beging the day prior to chemotherapy.  12 tablet  1  . ferrous gluconate (FERGON) 324 MG tablet Take 324 mg by mouth 2 (two) times a week. Takes 2 tabs on empty stomach with OJ on Monday and Friday.      Marland Kitchen HYDROcodone-acetaminophen (NORCO/VICODIN) 5-325 MG per tablet Take 1 tablet by mouth every 6 (six) hours as needed.  60 tablet  2  . lisinopril (PRINIVIL,ZESTRIL) 20 MG tablet Take 20 mg by mouth every morning.      Marland Kitchen LORazepam (ATIVAN) 0.5 MG tablet Take 1 tablet under the tongue or swallow every 6 hours as needed for nausea.  20 tablet  0  . ondansetron (ZOFRAN) 8 MG tablet Take 8-16 mg by mouth every 12 (twelve) hours as needed for nausea.      . polyethylene glycol (MIRALAX / GLYCOLAX) packet Take 17 g by mouth 2 (two) times daily as needed.        . simvastatin (ZOCOR) 40 MG tablet Take 1 tablet (40 mg total) by mouth at bedtime.  90 tablet  4  . traMADol (ULTRAM) 50 MG tablet Take 1 tablet (50 mg total) by mouth every 6 (six) hours as needed  for pain.  60 tablet  4   No current facility-administered medications on file prior to visit.    BP 118/60  Pulse 86  Temp(Src) 98.1 F (36.7 C) (Oral)  Resp 20  Wt 101 lb (45.813 kg)  BMI 18.47 kg/m2  SpO2 96%       Review of Systems  Constitutional: Negative.   HENT: Negative for hearing loss, congestion, sore throat, rhinorrhea, dental problem, sinus pressure and tinnitus.   Eyes: Negative for pain, discharge and visual disturbance.  Respiratory: Negative for cough and shortness of breath.   Cardiovascular: Negative for chest pain, palpitations and leg swelling.  Gastrointestinal: Negative for nausea, vomiting, abdominal pain, diarrhea, constipation, blood in stool and abdominal distention.  Genitourinary: Negative for dysuria, urgency, frequency, hematuria, flank pain, vaginal bleeding, vaginal discharge, difficulty urinating, vaginal pain and pelvic pain.  Musculoskeletal: Negative for joint swelling, arthralgias and gait problem.  Skin: Negative for rash.  Neurological: Negative for dizziness, syncope, speech difficulty, weakness, numbness and headaches.  Hematological: Negative for adenopathy.  Psychiatric/Behavioral: Negative for behavioral problems, dysphoric mood and agitation. The patient is not nervous/anxious.        Objective:   Physical Exam  Constitutional: She is oriented to person, place, and time. She appears well-developed and well-nourished.  HENT:  Head: Normocephalic.  Right Ear: External ear normal.  Left Ear: External ear normal.  Mouth/Throat: Oropharynx is clear and moist.  Eyes: Conjunctivae and EOM are normal. Pupils are equal, round, and reactive to light.  Neck: Normal range of motion. Neck supple. No thyromegaly present.  Cardiovascular: Normal rate, regular rhythm, normal heart sounds and intact distal pulses.   Pulmonary/Chest: Effort normal and breath sounds normal.  Abdominal: Soft. Bowel sounds are normal. She exhibits no mass.  There is no tenderness.  Musculoskeletal: Normal range of motion.  Lymphadenopathy:    She has no cervical adenopathy.  Neurological: She is alert and oriented to person, place, and time.  Skin: Skin is warm and dry. No rash noted.  Scattered subcutaneous nodules involving the extensor surface of her left lower arm ranging in diameter from 3-7 mm  Psychiatric: She has a normal mood  and affect. Her behavior is normal.          Assessment & Plan:  Hypertension well controlled Dyslipidemia Coronary artery disease stable Osteoporosis. Continue Prolia

## 2013-01-16 NOTE — Patient Instructions (Signed)
Limit your sodium (Salt) intake    It is important that you exercise regularly, at least 20 minutes 3 to 4 times per week.  If you develop chest pain or shortness of breath seek  medical attention.  Return in 6 months for follow-up  

## 2013-01-17 ENCOUNTER — Telehealth: Payer: Self-pay | Admitting: *Deleted

## 2013-01-17 NOTE — Telephone Encounter (Signed)
Pt denies any blood in stool or fever. Had ~ 7 bouts of diarrhea last evening, got up 1 time during night and has been 2 times this morning. Instructed pt to avoid dairy, suggested BRAT diet for today. Is eating crackers and coke. RN will call patient around lunch to check on her.

## 2013-01-17 NOTE — Telephone Encounter (Signed)
Pt states she has been having diarrhea all night, started last evening, has eased up some this morning, but " feels bad" and would like to see Dr Darrold Span today. States she has been hurting/achy since receiving neulasta injection, pt aware that is a side effect of neulasta. Will discuss with PA and call her back.

## 2013-01-18 ENCOUNTER — Encounter: Payer: Self-pay | Admitting: Oncology

## 2013-01-18 ENCOUNTER — Other Ambulatory Visit (HOSPITAL_BASED_OUTPATIENT_CLINIC_OR_DEPARTMENT_OTHER): Payer: Medicare Other | Admitting: Lab

## 2013-01-18 ENCOUNTER — Ambulatory Visit: Payer: Medicare Other | Admitting: Oncology

## 2013-01-18 ENCOUNTER — Inpatient Hospital Stay (HOSPITAL_COMMUNITY)
Admission: AD | Admit: 2013-01-18 | Discharge: 2013-01-24 | DRG: 808 | Disposition: A | Discharge status: Home Health | Payer: Medicare Other | Source: Ambulatory Visit | Attending: Oncology | Admitting: Oncology

## 2013-01-18 ENCOUNTER — Encounter (HOSPITAL_COMMUNITY): Payer: Self-pay

## 2013-01-18 VITALS — BP 135/65 | HR 104 | Temp 97.5°F | Resp 20 | Ht 62.0 in | Wt 92.2 lb

## 2013-01-18 DIAGNOSIS — E43 Unspecified severe protein-calorie malnutrition: Secondary | ICD-10-CM

## 2013-01-18 DIAGNOSIS — R197 Diarrhea, unspecified: Secondary | ICD-10-CM

## 2013-01-18 DIAGNOSIS — R5381 Other malaise: Secondary | ICD-10-CM | POA: Diagnosis present

## 2013-01-18 DIAGNOSIS — D61818 Other pancytopenia: Secondary | ICD-10-CM

## 2013-01-18 DIAGNOSIS — E871 Hypo-osmolality and hyponatremia: Secondary | ICD-10-CM

## 2013-01-18 DIAGNOSIS — D649 Anemia, unspecified: Secondary | ICD-10-CM

## 2013-01-18 DIAGNOSIS — I10 Essential (primary) hypertension: Secondary | ICD-10-CM

## 2013-01-18 DIAGNOSIS — T451X5A Adverse effect of antineoplastic and immunosuppressive drugs, initial encounter: Principal | ICD-10-CM

## 2013-01-18 DIAGNOSIS — E860 Dehydration: Secondary | ICD-10-CM

## 2013-01-18 DIAGNOSIS — D6481 Anemia due to antineoplastic chemotherapy: Secondary | ICD-10-CM

## 2013-01-18 DIAGNOSIS — J90 Pleural effusion, not elsewhere classified: Secondary | ICD-10-CM | POA: Diagnosis present

## 2013-01-18 DIAGNOSIS — E785 Hyperlipidemia, unspecified: Secondary | ICD-10-CM | POA: Diagnosis present

## 2013-01-18 DIAGNOSIS — I2510 Atherosclerotic heart disease of native coronary artery without angina pectoris: Secondary | ICD-10-CM | POA: Diagnosis present

## 2013-01-18 DIAGNOSIS — Z9010 Acquired absence of unspecified breast and nipple: Secondary | ICD-10-CM

## 2013-01-18 DIAGNOSIS — Z9221 Personal history of antineoplastic chemotherapy: Secondary | ICD-10-CM

## 2013-01-18 DIAGNOSIS — C44691 Other specified malignant neoplasm of skin of unspecified upper limb, including shoulder: Secondary | ICD-10-CM | POA: Diagnosis present

## 2013-01-18 DIAGNOSIS — C4499 Other specified malignant neoplasm of skin, unspecified: Secondary | ICD-10-CM

## 2013-01-18 DIAGNOSIS — I428 Other cardiomyopathies: Secondary | ICD-10-CM | POA: Diagnosis present

## 2013-01-18 DIAGNOSIS — D709 Neutropenia, unspecified: Secondary | ICD-10-CM

## 2013-01-18 DIAGNOSIS — D696 Thrombocytopenia, unspecified: Secondary | ICD-10-CM

## 2013-01-18 DIAGNOSIS — Z853 Personal history of malignant neoplasm of breast: Secondary | ICD-10-CM

## 2013-01-18 DIAGNOSIS — Z79899 Other long term (current) drug therapy: Secondary | ICD-10-CM

## 2013-01-18 DIAGNOSIS — Z8543 Personal history of malignant neoplasm of ovary: Secondary | ICD-10-CM

## 2013-01-18 DIAGNOSIS — M810 Age-related osteoporosis without current pathological fracture: Secondary | ICD-10-CM | POA: Diagnosis present

## 2013-01-18 DIAGNOSIS — D2330 Other benign neoplasm of skin of unspecified part of face: Secondary | ICD-10-CM

## 2013-01-18 DIAGNOSIS — E876 Hypokalemia: Secondary | ICD-10-CM

## 2013-01-18 DIAGNOSIS — R112 Nausea with vomiting, unspecified: Secondary | ICD-10-CM

## 2013-01-18 DIAGNOSIS — R0602 Shortness of breath: Secondary | ICD-10-CM | POA: Diagnosis present

## 2013-01-18 DIAGNOSIS — Z681 Body mass index (BMI) 19 or less, adult: Secondary | ICD-10-CM

## 2013-01-18 DIAGNOSIS — R599 Enlarged lymph nodes, unspecified: Secondary | ICD-10-CM | POA: Diagnosis present

## 2013-01-18 LAB — BASIC METABOLIC PANEL
BUN: 25 mg/dL — ABNORMAL HIGH (ref 6–23)
CO2: 26 mEq/L (ref 19–32)
Calcium: 7.1 mg/dL — ABNORMAL LOW (ref 8.4–10.5)
GFR calc non Af Amer: 57 mL/min — ABNORMAL LOW (ref >90)
Glucose, Bld: 91 mg/dL (ref 70–99)
Sodium: 129 mEq/L — ABNORMAL LOW (ref 135–145)

## 2013-01-18 LAB — HOLD TUBE, BLOOD BANK

## 2013-01-18 LAB — PREPARE RBC (CROSSMATCH)

## 2013-01-18 LAB — CBC WITH DIFFERENTIAL/PLATELET
BASO%: 0 % (ref 0.0–2.0)
HCT: 23.1 % — ABNORMAL LOW (ref 34.8–46.6)
LYMPH%: 16.1 % (ref 14.0–49.7)
MCH: 30.7 pg (ref 25.1–34.0)
MCHC: 35.1 g/dL (ref 31.5–36.0)
MCV: 87.5 fL (ref 79.5–101.0)
MONO%: 14.3 % — ABNORMAL HIGH (ref 0.0–14.0)
NEUT%: 69.6 % (ref 38.4–76.8)
Platelets: 7 10*3/uL — CL (ref 145–400)
RBC: 2.64 10*6/uL — ABNORMAL LOW (ref 3.70–5.45)
nRBC: 0 % (ref 0–0)

## 2013-01-18 LAB — CLOSTRIDIUM DIFFICILE BY PCR: Toxigenic C. Difficile by PCR: NEGATIVE

## 2013-01-18 LAB — COMPREHENSIVE METABOLIC PANEL (CC13)
ALT: 35 U/L (ref 0–55)
AST: 26 U/L (ref 5–34)
BUN: 28.4 mg/dL — ABNORMAL HIGH (ref 7.0–26.0)
Calcium: 7.8 mg/dL — ABNORMAL LOW (ref 8.4–10.4)
Chloride: 92 mEq/L — ABNORMAL LOW (ref 98–107)
Creatinine: 1 mg/dL (ref 0.6–1.1)
Total Bilirubin: 1.75 mg/dL — ABNORMAL HIGH (ref 0.20–1.20)

## 2013-01-18 LAB — ABO/RH: ABO/RH(D): B POS

## 2013-01-18 MED ORDER — CIPROFLOXACIN IN D5W 400 MG/200ML IV SOLN
250.0000 mg | Freq: Two times a day (BID) | INTRAVENOUS | Status: DC
  Filled 2013-01-18 (×2): qty 200

## 2013-01-18 MED ORDER — TRAMADOL HCL 50 MG PO TABS
50.0000 mg | ORAL_TABLET | Freq: Four times a day (QID) | ORAL | Status: DC | PRN
  Administered 2013-01-20: 50 mg via ORAL
  Filled 2013-01-18: qty 1

## 2013-01-18 MED ORDER — CARVEDILOL 25 MG PO TABS
25.0000 mg | ORAL_TABLET | Freq: Two times a day (BID) | ORAL | Status: DC
  Administered 2013-01-18 – 2013-01-24 (×10): 25 mg via ORAL
  Filled 2013-01-18 (×14): qty 1

## 2013-01-18 MED ORDER — POTASSIUM CHLORIDE IN NACL 40-0.9 MEQ/L-% IV SOLN
INTRAVENOUS | Status: DC
  Administered 2013-01-18 – 2013-01-19 (×3): via INTRAVENOUS
  Filled 2013-01-18 (×7): qty 1000

## 2013-01-18 MED ORDER — ACYCLOVIR 200 MG PO CAPS
400.0000 mg | ORAL_CAPSULE | Freq: Three times a day (TID) | ORAL | Status: DC
  Filled 2013-01-18 (×3): qty 2

## 2013-01-18 MED ORDER — HYDROCODONE-ACETAMINOPHEN 5-325 MG PO TABS
1.0000 | ORAL_TABLET | Freq: Four times a day (QID) | ORAL | Status: DC | PRN
  Administered 2013-01-19 – 2013-01-20 (×2): 1 via ORAL
  Filled 2013-01-18 (×2): qty 1

## 2013-01-18 MED ORDER — FERROUS GLUCONATE 324 (38 FE) MG PO TABS
324.0000 mg | ORAL_TABLET | ORAL | Status: DC
  Filled 2013-01-18: qty 1

## 2013-01-18 MED ORDER — CIPROFLOXACIN IN D5W 200 MG/100ML IV SOLN
200.0000 mg | Freq: Two times a day (BID) | INTRAVENOUS | Status: DC
  Administered 2013-01-18 – 2013-01-19 (×3): 200 mg via INTRAVENOUS
  Filled 2013-01-18 (×4): qty 100

## 2013-01-18 MED ORDER — LOPERAMIDE HCL 2 MG PO CAPS
2.0000 mg | ORAL_CAPSULE | ORAL | Status: DC | PRN
  Administered 2013-01-21 – 2013-01-22 (×3): 2 mg via ORAL
  Filled 2013-01-18 (×3): qty 1

## 2013-01-18 MED ORDER — VITAMINS A & D EX OINT
TOPICAL_OINTMENT | CUTANEOUS | Status: AC
  Administered 2013-01-18: 5
  Filled 2013-01-18: qty 5

## 2013-01-18 MED ORDER — ONDANSETRON HCL 8 MG PO TABS
8.0000 mg | ORAL_TABLET | Freq: Two times a day (BID) | ORAL | Status: DC | PRN
Start: 2013-01-18 — End: 2013-01-24

## 2013-01-18 MED ORDER — CIPROFLOXACIN HCL 250 MG PO TABS
250.0000 mg | ORAL_TABLET | Freq: Two times a day (BID) | ORAL | Status: DC
  Administered 2013-01-18 (×2): 250 mg via ORAL
  Filled 2013-01-18 (×3): qty 1

## 2013-01-18 MED ORDER — DIPHENHYDRAMINE HCL 25 MG PO CAPS
25.0000 mg | ORAL_CAPSULE | Freq: Once | ORAL | Status: AC
  Administered 2013-01-18: 25 mg via ORAL

## 2013-01-18 MED ORDER — LORAZEPAM 0.5 MG PO TABS
0.5000 mg | ORAL_TABLET | Freq: Four times a day (QID) | ORAL | Status: DC | PRN
  Administered 2013-01-18 – 2013-01-19 (×3): 0.5 mg via ORAL
  Filled 2013-01-18 (×3): qty 1

## 2013-01-18 MED ORDER — ACETAMINOPHEN 325 MG PO TABS: 325.0000 mg | ORAL_TABLET | Freq: Once | ORAL | Status: DC

## 2013-01-18 MED ORDER — ACYCLOVIR 400 MG PO TABS
400.0000 mg | ORAL_TABLET | Freq: Three times a day (TID) | ORAL | Status: DC
  Administered 2013-01-18 – 2013-01-24 (×18): 400 mg via ORAL
  Filled 2013-01-18 (×22): qty 1

## 2013-01-18 MED ORDER — POTASSIUM CHLORIDE CRYS ER 10 MEQ PO TBCR
10.0000 meq | EXTENDED_RELEASE_TABLET | ORAL | Status: AC
  Administered 2013-01-18 (×3): 10 meq via ORAL
  Filled 2013-01-18 (×5): qty 1

## 2013-01-18 MED ORDER — ACETAMINOPHEN 325 MG PO TABS
650.0000 mg | ORAL_TABLET | Freq: Once | ORAL | Status: AC
  Administered 2013-01-18: 650 mg via ORAL
  Filled 2013-01-18: qty 2

## 2013-01-18 MED ORDER — ONDANSETRON 8 MG/NS 50 ML IVPB
8.0000 mg | Freq: Three times a day (TID) | INTRAVENOUS | Status: DC | PRN
  Administered 2013-01-18 – 2013-01-19 (×2): 8 mg via INTRAVENOUS
  Filled 2013-01-18 (×2): qty 8

## 2013-01-18 MED ORDER — PANTOPRAZOLE SODIUM 40 MG IV SOLR
40.0000 mg | INTRAVENOUS | Status: DC
  Administered 2013-01-18 – 2013-01-23 (×6): 40 mg via INTRAVENOUS
  Filled 2013-01-18 (×6): qty 40

## 2013-01-18 MED ORDER — ONDANSETRON 8 MG/NS 50 ML IVPB
8.0000 mg | Freq: Once | INTRAVENOUS | Status: AC
  Administered 2013-01-18: 8 mg via INTRAVENOUS
  Filled 2013-01-18: qty 8

## 2013-01-18 MED ORDER — ACETAMINOPHEN 325 MG PO TABS
325.0000 mg | ORAL_TABLET | Freq: Once | ORAL | Status: AC
  Administered 2013-01-18: 325 mg via ORAL

## 2013-01-18 MED ORDER — AMLODIPINE BESYLATE 2.5 MG PO TABS
2.5000 mg | ORAL_TABLET | Freq: Every morning | ORAL | Status: DC
  Administered 2013-01-18 – 2013-01-24 (×5): 2.5 mg via ORAL
  Filled 2013-01-18 (×7): qty 1

## 2013-01-18 NOTE — Progress Notes (Signed)
Patient with multiple issues today. Type and screen drawn via right chest [facility] for hospital admit to [facility]/

## 2013-01-18 NOTE — Progress Notes (Signed)
Medical Oncology  See full H&P from earlier today. Status reviewed since admission, patient seen and examined now, discussed with RN on unit and at bedside. She has had no bleeding and Tmax 99. Platelets have completed, NS with K+ has run since admission, PRBCs to be given next. She has taken po potassium, but is still nauseated despite zofran x1 and ativan x1, with 2 small volume emesis. She has had diarrhea only x1 since admission. C diff just resulted negative. Repeat BMET  drawn now and will adjust IVF depending on those results. BP and other vitals stable, O2 sats 99% so not using [employer] O2.  Will repeat zofran 8 mg IV now and begin protonix IV.  Son not here presently.   Ila Mcgill, MD

## 2013-01-18 NOTE — Progress Notes (Signed)
Medical Oncology  01-18-2013   Patient was admitted directly to hospital from [facility] due to pancytopenia, diarrhea and electrolyte abnormalities. See full H & P for details. Charge will be for admission.  Ila Mcgill, MD

## 2013-01-18 NOTE — Progress Notes (Signed)
At 2130 pt had temp of 100.5, called MD for order for blood culture.  Dr. Gaylyn Rong on call ordered blood culture X 1.  Pt also unable to take PO cipro as soon as she attempted to swallow it she threw up.  MD notified and new orders given to switch to IV cipro.  Also notified MD that blood culture could not be drawn until 2 hours after blood transfusion has stopped.  MD aware and told RN to hold 2nd unit of blood until right after blood culture are drawn.  Will continue to monitor.  Fatima Sanger A

## 2013-01-18 NOTE — Patient Instructions (Signed)
Direct admission to hospital today

## 2013-01-18 NOTE — Progress Notes (Signed)
INITIAL NUTRITION ASSESSMENT  Pt meets criteria for severe MALNUTRITION in the context of acute illness as evidenced by <50% estimated energy intake in the past 5 days with 8.5% weight loss in the past 2 weeks.  DOCUMENTATION CODES Per approved criteria  -Severe malnutrition in the context of acute illness or injury -Underweight   INTERVENTION: - Anti-diarrheals per MD - Discussed diet therapy for diarrhea - Assisted pt with ordering bland, low fiber meals - Will continue to monitor   NUTRITION DIAGNOSIS: Altered GI function related to ongoing diarrhea as evidenced by pt report/MD notes.   Goal: 1. Resolution of diarrhea 2. Pt to consume >90% of meals  Monitor:  Weights, labs, intake, diarrhea  Reason for Assessment: Nutrition risk   79 y.o. female  Admitting Dx: Diarrhea  ASSESSMENT: Pt with porocarcinoma s/p re-excisions on outpatient chemotherapy. Pt with history of breast CA and ovarian CA. Pt reports no food intake in the past 5 days other than 1/2 of potato salad on Sunday. Pt reports trying to drink a lot of fluids at home. Pt reports 9 pound unintended weight loss in the past 2 weeks. Pt reports before Sunday she was eating 3 meals/day. Pt denies any problems chewing or swallowing. Pt with ongoing diarrhea since 01/16/13 and reports she was up all last night with diarrhea.   Height: Ht Readings from Last 1 Encounters:  01/18/13 5\' 2"  (1.575 m)    Weight: Wt Readings from Last 1 Encounters:  01/18/13 97 lb (44 kg)    Ideal Body Weight: 110 lb   % Ideal Body Weight: 88  Wt Readings from Last 10 Encounters:  01/18/13 97 lb (44 kg)  01/18/13 92 lb 3.2 oz (41.822 kg)  01/16/13 101 lb (45.813 kg)  01/09/13 99 lb 12.8 oz (45.269 kg)  12/30/12 100 lb 3.2 oz (45.45 kg)  11/15/12 99 lb (44.906 kg)  09/30/12 102 lb 12.8 oz (46.63 kg)  07/12/12 105 lb (47.628 kg)  05/20/12 104 lb 12.8 oz (47.537 kg)  05/13/12 103 lb (46.72 kg)    Usual Body Weight: 106 lb per pt  report  % Usual Body Weight: 92  BMI:  Body mass index is 17.74 kg/(m^2). Underweight  Estimated Nutritional Needs: Kcal: 1350-1550 Protein: 55-65g Fluid: 1.3-1.5L/day  Skin: Intact   Diet Order: General  EDUCATION NEEDS: -Education needs addressed - briefly discussed diet therapy for diarrhea  No intake or output data in the 24 hours ending 01/18/13 1525  Last BM: Up all night with diarrhea per pt report   Labs:   Recent Labs Lab 01/18/13 0944  NA 129*  K 3.1*  CL 92*  CO2 29  BUN 28.4*  CREATININE 1.0  CALCIUM 7.8*  GLUCOSE 110*    CBG (last 3)  No results found for this basename: GLUCAP,  in the last 72 hours  Scheduled Meds: . acyclovir  400 mg Oral TID  . amLODipine  2.5 mg Oral q morning - 10a  . carvedilol  25 mg Oral BID WC  . ciprofloxacin  250 mg Oral BID  . [START ON 01/19/2013] ferrous gluconate  324 mg Oral 2 times weekly    Continuous Infusions: . 0.9 % NaCl with KCl 40 mEq / L 250 mL/hr at 01/18/13 1243    Past Medical History  Diagnosis Date  . Hypertension   . Allergy   . Cancer   . Ovarian ca   . Cataract   . Breast CA   . CAD (coronary artery disease)   .  Hyperlipidemia   . Cardiomyopathy     improved  . Arthritis   . Osteoporosis     Past Surgical History  Procedure Laterality Date  . Left breast mastectomy  1994    T3 N1 Mx  . Shoulder arthroscopy    . Right leg    . Shoulder surgery      replacement  . Modified radical mastectomy w/ axillary lymph node dissection w/ pectoralis minor excision  1994    Left  . Eye surgery      CATARACTS  . Abdominal hysterectomy  2004  . Cholecystectomy  1993  . Breast surgery       Levon Hedger MS, RD, LDN [phone] Pager [phone] After Hours Pager

## 2013-01-18 NOTE — H&P (Signed)
MEDICAL ONCOLOCY History and Physical Exam 01-18-2013  Physicians: P.Kwaitkowski, P.Drusilla Kanner, F.Lupton, B.Crenshaw, M.Martin,  Gi  Chyrl Civatte W.Varano is a 79 yo lady admitted from [facility] day 15 cycle 1 gemzar/ taxotere for porocarcinoma involving left upper extremity, with pancytopenia, diarrhea, hypokalemia, hyponatremia and weakness related to all of this. She received gemzar on 01-04-13, gemzar + taxotere on 01-11-13 and neulasta on 01-12-13; doses of both gemzar and taxotere were reduced due to prior chemo and general health status. Patient has "felt badly" since 01-13-13 and had diarrhea since evening of 01-16-13 including every hour last night. She has not had fever or bleeding. She has some chronic back pain, otherwise only pain has been cramping discomfort in abdomen with diarrhea. She has had nausea without vomiting, has kept down some fluids (mostly water).   She had PAC placed by IR for this chemotherapy   Oncologic History Patient was diagnosed with porocarcinoma involving 3 areas left hand and arm in spring 2012 by Dr.Lupton. She had re-excisions of all areas (dorsum left hand, 2 on left forearm and left arm) by Dr Herbie Saxon. She had significant lymphedema LUE after those procedures, with history of left breast cancer with 20 node left axillary dissection in 1994 followed by radiation. Repeat scans at [facility] in 05-2012 reportedly had no other distant disease. Because of progressive in transit mets, she had hyperthermic limb infusion by Dr Lenis Noon at [facility] in 06-2012 (chemotherapeutic agent not in notes available to me); she also had excision of an area on dorsum of left hand in Oct 2013. She had significant swelling of LUE after the limb perfusion, which gradually improved.She had progression of the in transit mets LUE since the limb perfusion, which are not symptomatic. She was seen in consultation by Dr Orvilla Fus at [facility], with recommendation for gemzar/taxotere as systemic  sarcoma regimen, chosen in part due to her prior chemotherapy regimens. [facility] notes report no evidence of disease on CXR there on 11-23-2012, and their last CT CAP 06-15-12 no distant disease; she has not had more recent imaging in [facility] system. She did not receive day 1 cycle 1 gemzar as planned at [facility] last week due to need for precertification; my office is in process of getting the precert changed to our facitily. Tho she did not use central catheter for previous chemotherapy, with prior chemo, risk of gemzar irritation and as access is limited to just RUE, we have made arrangements for [facility] to be placed by IR on 01-02-13. Patient had teaching for gemzar/ taxotere at [facility] and RN has reviewed here today. We plan to begin treatment after PAC is placed next week.  She also had ovarian cancer  IA clear cell in 2004, treated after surgery with 3 cycles of taxol/ carboplatin. She saw Dr Yolande Jolly in June 2013 and has year follow up with him.  Her left breast cancer was T3N1 (2 of 20 nodes) ER+ in 1994, treated with mastectomy and axillary nodes, adriamycin/cytoxan x4, RT, and tamoxifen x 5 years. She has some baseline lymphedema LUE.  Other significant medical problems include cardiomyopathy and some CAD with normal EF by echo 2012, LBBB, elevated lipids known to Dr Jens Som. PCP is Dr Frederica Kuster, whom she saw on 01-16-13.  Allergies:  Allergies  Allergen Reactions  . Ibuprofen Nausea Only  . Morphine Nausea And Vomiting   Past Medical History  Diagnosis Date  . Hypertension   . Allergy   . Cancer   . Ovarian ca   . Cataract   .  Breast CA   . CAD (coronary artery disease)   . Hyperlipidemia   . Cardiomyopathy     improved  . Arthritis   . Osteoporosis     Past Surgical History  Procedure Laterality Date  . Left breast mastectomy  1994    T3 N1 Mx  . Shoulder arthroscopy    . Right leg    . Shoulder surgery      replacement  . Modified radical mastectomy w/ axillary  lymph node dissection w/ pectoralis minor excision  1994    Left  . Eye surgery      CATARACTS  . Abdominal hysterectomy  2004  . Cholecystectomy  1993  . Breast surgery     See details of PMH also in summary note above. Family History  Problem Relation Age of Onset  . Breast cancer Sister   . Cancer Sister   . Heart disease Sister   . Heart disease Father   . Heart disease Mother     Social History:  reports that she has never smoked. She does not have any smokeless tobacco history on file. She reports that she does not drink alcohol or use illicit drugs. Widowed, lives alone with son nearby.  Physical Exam  Weight 92 lbs 3 oz (down from 101 on 01-16-13 and 99 lb/12 oz on 01-09-13)  BP 135/65, HR 104 regular, resp 20 not labored at rest on RA, temp 97.5 oral  Alert, looks ill and weak. Pale, not jaundiced. No overt bleeding. Ambulatory with effort. HEENT: no alopecia. PERRL, not icteric. Oral mucosa dry, lips dry and cracking, no thrush or ulcerations. Neck supple without JVD. Lymphatics: no cervical, supraclavicular adenopathy Lungs without wheezes or rales, clear to P Heart RRR, tachy, no gallop PAC site without tenderness or erythema, accessed with good blood return at office. Left mastectomy scar. 1+ swelling LUE. Scattered firm nodules of the porocarcinoma unchanged. Abdomen soft, active bowel sounds, not tender, no appreciable HSM LE no pitting edema, cords, tenderness Skin without ecchymosis or petechiae Neuro: CN, motor, sensory, cerebellar grossly at baseline   Medications Prior to Admission  Medication Sig Dispense Refill  . acyclovir (ZOVIRAX) 400 MG tablet Take 20 mg by mouth 3 (three) times daily as needed (for fever blisters).      Marland Kitchen amLODipine (NORVASC) 2.5 MG tablet Take 2.5 mg by mouth every morning.      Marland Kitchen aspirin 81 MG tablet Take 81 mg by mouth at bedtime.       . Calcium Carbonate-Vitamin D (CALCIUM + D PO) Take 600 mg by mouth 2 (two) times daily.        . carvedilol (COREG) 25 MG tablet Take 1 tablet (25 mg total) by mouth 2 (two) times daily with a meal.  180 tablet  5  . Cholecalciferol (VITAMIN D) 2000 UNITS tablet Take 2,000 Units by mouth daily.      . Coenzyme Q10 (CO Q 10) 100 MG CAPS Take 1 capsule by mouth daily.      Marland Kitchen denosumab (PROLIA) 60 MG/ML SOLN injection Inject 60 mg into the skin every 6 (six) months. Administer in upper arm, thigh, or abdomen      . dexamethasone (DECADRON) 4 MG tablet Take 2 tabs twice a day for 3 days beging the day prior to chemotherapy.  12 tablet  1  . ferrous gluconate (FERGON) 324 MG tablet Take 324 mg by mouth 2 (two) times a week. Takes 2 tabs on empty stomach with OJ  on Monday and Friday.      Marland Kitchen HYDROcodone-acetaminophen (NORCO/VICODIN) 5-325 MG per tablet Take 1 tablet by mouth every 6 (six) hours as needed.  60 tablet  2  . lisinopril (PRINIVIL,ZESTRIL) 20 MG tablet Take 20 mg by mouth every morning.      Marland Kitchen LORazepam (ATIVAN) 0.5 MG tablet Take 1 tablet under the tongue or swallow every 6 hours as needed for nausea.  20 tablet  0  . ondansetron (ZOFRAN) 8 MG tablet Take 8-16 mg by mouth every 12 (twelve) hours as needed for nausea.      . polyethylene glycol (MIRALAX / GLYCOLAX) packet Take 17 g by mouth 2 (two) times daily as needed.        . simvastatin (ZOCOR) 40 MG tablet Take 1 tablet (40 mg total) by mouth at bedtime.  90 tablet  4  . traMADol (ULTRAM) 50 MG tablet Take 1 tablet (50 mg total) by mouth every 6 (six) hours as needed for pain.  60 tablet  4    Results for orders placed in visit on 01/18/13 (from the past 48 hour(s))  CBC WITH DIFFERENTIAL     Status: Abnormal   Collection Time    01/18/13  9:44 AM      Result Value Range   WBC 0.6 (*) 3.9 - 10.3 10e3/uL   NEUT# 0.4 (*) 1.5 - 6.5 10e3/uL   HGB 8.1 (*) 11.6 - 15.9 g/dL   HCT 08.6 (*) 57.8 - 46.9 %   Platelets 7 Platelet count confirmed by slide estimate (*) 145 - 400 10e3/uL   MCV 87.5  79.5 - 101.0 fL   MCH 30.7  25.1 -  34.0 pg   MCHC 35.1  31.5 - 36.0 g/dL   RBC 6.29 (*) 5.28 - 4.13 10e6/uL   RDW 12.5  11.2 - 14.5 %   lymph# 0.1 (*) 0.9 - 3.3 10e3/uL   MONO# 0.1  0.1 - 0.9 10e3/uL   Eosinophils Absolute 0.0  0.0 - 0.5 10e3/uL   Basophils Absolute 0.0  0.0 - 0.1 10e3/uL   NEUT% 69.6  38.4 - 76.8 %   LYMPH% 16.1  14.0 - 49.7 %   MONO% 14.3 (*) 0.0 - 14.0 %   EOS% 0.0  0.0 - 7.0 %   BASO% 0.0  0.0 - 2.0 %   nRBC 0  0 - 0 %  COMPREHENSIVE METABOLIC PANEL (CC13)     Status: Abnormal   Collection Time    01/18/13  9:44 AM      Result Value Range   Sodium 129 (*) 136 - 145 mEq/L   Potassium 3.1 (*) 3.5 - 5.1 mEq/L   Chloride 92 (*) 98 - 107 mEq/L   CO2 29  22 - 29 mEq/L   Glucose 110 (*) 70 - 99 mg/dl   BUN 24.4 (*) 7.0 - 01.0 mg/dL   Creatinine 1.0  0.6 - 1.1 mg/dL   Total Bilirubin 2.72 (*) 0.20 - 1.20 mg/dL   Alkaline Phosphatase 47  40 - 150 U/L   AST 26  5 - 34 U/L   ALT 35  0 - 55 U/L   Total Protein 5.0 (*) 6.4 - 8.3 g/dL   Albumin 2.2 (*) 3.5 - 5.0 g/dL   Calcium 7.8 (*) 8.4 - 10.4 mg/dL  HOLD TUBE, BLOOD BANK     Status: None   Collection Time    01/18/13 11:15 AM      Result Value Range  Hold Tube, Blood Bank       Value: Patient to be admitted,Sample held in Blood Bank 72 hrs.   No results found.   Stool for C diff pending   Discussed with patient and son together at [facility]. She needs transfusion platelets and PRBCs, correction of sodium and potassium and evaluation/ treatment of the ongoing diarrhea. WIth extent of problems, she needs urgent admission to hospital, and they agree.  Assessment/Plan 1.Pancytopenia: from recent chemotherapy.  She is neutropenic despite neulasta given 01-12-13 but not febrile; with ANC <0.5 will start empiric cipro. Neutropenic precautions. Anemia is acute related to recent chemotherapy, with some prior chronic anemia also; will transfuse 2 units PRBCs as she is also dehydrated, so actual Hgb is probably even lower. Thrombocytopenia related to recent  chemotherapy: no overt bleeding. Will transfuse platelets as soon as available. Hold ASA 2.Diarrhea: may be from chemo but will check C diff. If C diff negative can use imodium 3.Dehydration related to diarrhea: IVF and follow I/O and labs 4. Hyponatremia from diarrhea and free water replacement: NS and follow labs 5.hypokalemia from diarrhea supplement po and IV. Repeat BMET this pm 6. PAC in: accessed at [facility] to expedite start of care on inpatient unit 7. Porocarcinoma LUE with intransit mets despite excisions and limb perfusion chemotherapy at [facility]. Gemzar/ taxotere for this 8.history of node positive left breast cancer not known recurrent 9. History of ovarian cancer not known active. 10. Lymphedema LUE related to #8 and #7 11.cardiac disease including CAD, HTN and previous cardiomyopathy, known followed by [facility] cardiology 12. No DVT prophylaxis including SCD with platelets <10k now  LIVESAY,LENNIS P 01/18/2013, 11:48 AM

## 2013-01-18 NOTE — Progress Notes (Signed)
Patient just threw up 550 cc directly after po tylenol and other meds were given.  Patient has temp. 99.7 RN unsure if patient absorbed any of the tylenol due to emesis.  Patient also has odorous urine.  Called Dr. Darrold Span to notify.  Orders to transfuse blood despite temp, give tylenol 325 mg 30 minutes after blood is started if patient can take without throwing up, send urine for U/A + C+S

## 2013-01-19 DIAGNOSIS — R110 Nausea: Secondary | ICD-10-CM

## 2013-01-19 LAB — COMPREHENSIVE METABOLIC PANEL
ALT: 21 U/L (ref 0–35)
Alkaline Phosphatase: 39 U/L (ref 39–117)
BUN: 19 mg/dL (ref 6–23)
CO2: 21 mEq/L (ref 19–32)
GFR calc Af Amer: 90 mL/min — ABNORMAL LOW (ref >90)
GFR calc non Af Amer: 77 mL/min — ABNORMAL LOW (ref >90)
Glucose, Bld: 87 mg/dL (ref 70–99)
Potassium: 3.9 mEq/L (ref 3.5–5.1)
Total Protein: 4.6 g/dL — ABNORMAL LOW (ref 6.0–8.3)

## 2013-01-19 LAB — CBC WITH DIFFERENTIAL/PLATELET
Basophils Relative: 0 % (ref 0–1)
Eosinophils Absolute: 0 10*3/uL (ref 0.0–0.7)
Eosinophils Relative: 0 % (ref 0–5)
HCT: 28.7 % — ABNORMAL LOW (ref 36.0–46.0)
Hemoglobin: 10.3 g/dL — ABNORMAL LOW (ref 12.0–15.0)
MCH: 30.8 pg (ref 26.0–34.0)
MCHC: 35.9 g/dL (ref 30.0–36.0)
Monocytes Absolute: 0.3 10*3/uL (ref 0.1–1.0)
Neutro Abs: 1.3 10*3/uL — ABNORMAL LOW (ref 1.7–7.7)
RBC: 3.34 MIL/uL — ABNORMAL LOW (ref 3.87–5.11)

## 2013-01-19 LAB — PREPARE PLATELET PHERESIS: Unit division: 0

## 2013-01-19 MED ORDER — ACETAMINOPHEN 160 MG/5ML PO SOLN: 325.0000 mg | Freq: Once | ORAL | Status: DC

## 2013-01-19 MED ORDER — PROMETHAZINE HCL 25 MG/ML IJ SOLN
6.2500 mg | Freq: Four times a day (QID) | INTRAMUSCULAR | Status: DC | PRN
  Administered 2013-01-19 – 2013-01-20 (×3): 12.5 mg via INTRAVENOUS
  Filled 2013-01-19 (×3): qty 1

## 2013-01-19 MED ORDER — SODIUM CHLORIDE 0.9 % IV SOLN
INTRAVENOUS | Status: AC
  Administered 2013-01-20: 01:00:00 via INTRAVENOUS
  Filled 2013-01-19 (×2): qty 1000

## 2013-01-19 MED ORDER — SODIUM CHLORIDE 0.9 % IV SOLN
INTRAVENOUS | Status: AC
  Administered 2013-01-19: 12:00:00 via INTRAVENOUS
  Filled 2013-01-19: qty 1000

## 2013-01-19 MED ORDER — ACETAMINOPHEN 325 MG PO TABS: 325.0000 mg | ORAL_TABLET | Freq: Once | ORAL | Status: DC

## 2013-01-19 MED ORDER — ACETAMINOPHEN 160 MG/5ML PO SOLN
325.0000 mg | Freq: Once | ORAL | Status: AC
  Administered 2013-01-19: 325 mg via ORAL
  Filled 2013-01-19: qty 20.3

## 2013-01-19 NOTE — Progress Notes (Signed)
Have attempted 3 times to obtain urine specimen, each time pt has had small amount of bowel movement into the container mixed with urine.  Will continue to try and obtain this specimen.  Fatima Sanger A

## 2013-01-19 NOTE — Clinical Documentation Improvement (Signed)
MALNUTRITION DOCUMENTATION CLARIFICATION  THIS DOCUMENT IS NOT A PERMANENT PART OF THE MEDICAL RECORD  TO RESPOND TO THE THIS QUERY, FOLLOW THE INSTRUCTIONS BELOW:  1. If needed, update documentation for the patient's encounter via the notes activity.  2. Access this query again and click edit on the In [employer].  3. After updating, or not, click F2 to complete all highlighted (required) fields concerning your review. Select "additional documentation in the medical record" OR "no additional documentation provided".  4. Click Sign note button.  5. The deficiency will fall out of your In Basket *Please let us know if you are not able to complete this workflow by phone or e-mail (listed below).  Please update your documentation within the medical record to reflect your response to this query.                                                                                        01/19/13   Dear Dr.[facility],  In a better effort to capture your patient's severity of illness, reflect appropriate length of stay and utilization of resources, a review of the patient medical record has revealed the following indicators.    Based on your clinical judgment, please clarify and document in a progress note and/or discharge summary the clinical condition associated with the following supporting information:  In responding to this query please exercise your independent judgment.  The fact that a query is asked, does not imply that any particular answer is desired or expected. 01/18/13 Nutr Eval w/ nutrition documentation criteria for "-Severe malnutrition in the context of acute illness or injury-Underweight"... For accurate Dx specificity & severity please help validate nutr documentation for clinical cond being eval'd, mon'd & tx'd. Thank you   Possible Clinical Conditions? . Mild Malnutrition  . Moderate Malnutrition . Severe Malnutrition   . Protein Calorie Malnutrition . Severe  Protein Calorie Malnutrition  . Emaciation  . Cachexia   Other Condition (please specify) Cannot Clinically Determine  Supporting Information: Risk Factors: See Nutrition Eval 01/18/13  Signs & Symptoms: See Nutrition Eval 01/18/13  -Diagnostics: See Nutrition Eval 01/18/13  Treatments: See Nutrition Eval 01/18/13  -Nutrition Consult: See Nutrition Eval 01/18/13   You may use possible, probable, or suspect with inpatient documentation. possible, probable, suspected diagnoses MUST be documented at the time of discharge  Reviewed: additional documentation in the medical record  Thank You,  Toribio Harbour, RN, BSN, CCDS Certified Clinical Documentation Specialist Pager: [phone]  Health Information Management [place]

## 2013-01-19 NOTE — Significant Event (Signed)
CRITICAL VALUE ALERT  Critical value received:  platelets 15  Date of notification:  01/19/13  Time of notification:  0855  Critical value read back:yes   Nurse who received alert:  Bernita Buffy  MD notified (1st page):  Dr. Darrold Span  Time of first page:  [phone]  MD notified (2nd page):  Time of second page:  Responding MD: Dr Darrold Span  Time MD responded:  0900

## 2013-01-19 NOTE — Progress Notes (Signed)
  01/19/2013, 7:56 AM  Hospital day: 2 Antibiotics: prophylactic cipro Chemotherapy: day 16 cycle 1 gemzar taxotere (gemzar 2-19 and gemzar/taxotere 2-26 with neulasta 01-12-23).    Subjective:  thinks she is feeling just a little better this AM. No bleeding overnight, Tmax 100.5 at 2130 during PRBCs, down to 99.1 at recheck and 98 since 0500. Nausea much of night despite meds, and several episodes of diarrhea (charted as 2x). Labs are still pending this AM. Still slight nausea now, sips water. Transfused 1 phoresis pack platelets and 2 units PRBCs since admission. Son not here just now.  Objective: Vital signs in last 24 hours: Blood pressure 134/56, pulse 96, temperature 98.9 F (37.2 C), temperature source Oral, resp. rate 20, height 5\' 2"  (1.575 m), weight 97 lb (44 kg), SpO2 97.00%. Awake, alert, oriented and appropriate. Color better. Resp 24 just post bath. Oral mucosa dry, lips dry, no lesions. PERRL.No alopecia. Lungs clear,no wheezes or rales. Heart RRR, no gallop. PAC site fine, infusing NS with 40 K at 250. Abdomen soft, not tender, quiet. Perirectal area with no skin breakdown, minimal erythema, external hemorrhoids not inflamed. LE no edema, cords, tenderness and feet warm. Skin without petechiae or ecchymosis. No increased swelling LUE. Moves in bed without assistance.  Intake/Output from previous day: 03/05 0701 - 03/06 0700 In: 947 [I.V.:923; Blood:24] Out: 1501 [Urine:750; Emesis/NG output:750; Stool:1] Intake/Output this shift:   IV rate changed to 150cc/hr and will adjust further when AM labs result if needed.   Lab Results:  Recent Labs  01/18/13 0944  WBC 0.6*  HGB 8.1*  HCT 23.1*  PLT 7 Platelet count confirmed by slide estimate*   BMET  Recent Labs  01/18/13 0944 01/18/13 1754  NA 129* 129*  K 3.1* 3.2*  CL 92* 94*  CO2 29 26  GLUCOSE 110* 91  BUN 28.4* 25*  CREATININE 1.0 0.93  CALCIUM 7.8* 7.1*   Blood culture sent overnight. Urine still  pending. C.diff resulted negative yesterday.  Studies/Results: No results found.   Assessment/Plan: 1. Pancytopenia secondary to recent (dose reduced) chemotherapy: post neulasta 2-27, 1 phoresis pack platelets 3-5 and 2 units PRBCs overnight. On prophylactic cipro, neutropenic and bleeding precautions. Labs pending now.No prophylactic anticoagulation or PAS until platelets improved. Held ASA on admission 2.progressive porocarcinoma with in transit mets LUE: chemo as above 3.history of CAD, previous cardiomyopathy in 2006 which improved to normal by echo Jan 2010, mild AI and MR, LBBB per Dr Ludwig Clarks note 04-2012. No clinical CHF with fluids and blood products overnight, but need to watch closely. ACE held on admission with dehydration. 4.diarrhea and nausea: appears chemo related, C diff negative. Has not used prn imodium with nausea last night. Continue IV protonix, zofran/ ativan and have written phenergan at 6.25 - 12.5 mg IV q 6 hr prn. Clear liquids po for now. 5.hyponatremia, hypokalemia: related to diarrhea. Repeat labs pending. IVF as above 6.PAC in 7.history of node positive left breast cancer with mastectomy/ left axillary node dissection, chemo and RT. Baseline lymphedema LUE (worse with interventions for the porocarcinoma LUE) 8.history of ovarian cancer, not known active.  9. Osteoporosis with previous vertebral compression fractures and some chronic back and shoulder pain  Discussed with RN on unit; discussed with tech on unit. Not stable for DC LIVESAY,LENNIS P [phone]

## 2013-01-19 NOTE — Progress Notes (Signed)
Pt has finished receiving 2 units PRBC.  Pt tolerated ok, despite the fact that she has had continuous nausea and a low grade fever throughout the night and after anything PO she throws it right back up.  Pt did receive a little relief from the IV zofran but it didn't last very long.  Will continue to monitor.  Leonor Liv, RN

## 2013-01-20 LAB — TYPE AND SCREEN
ABO/RH(D): B POS
Unit division: 0

## 2013-01-20 LAB — COMPREHENSIVE METABOLIC PANEL
AST: 7 U/L (ref 0–37)
Alkaline Phosphatase: 40 U/L (ref 39–117)
CO2: 21 mEq/L (ref 19–32)
Chloride: 105 mEq/L (ref 96–112)
Creatinine, Ser: 0.7 mg/dL (ref 0.50–1.10)
GFR calc non Af Amer: 80 mL/min — ABNORMAL LOW (ref >90)
Potassium: 3 mEq/L — ABNORMAL LOW (ref 3.5–5.1)
Total Bilirubin: 1.3 mg/dL — ABNORMAL HIGH (ref 0.3–1.2)

## 2013-01-20 LAB — CBC WITH DIFFERENTIAL/PLATELET
Eosinophils Relative: 0 % (ref 0–5)
HCT: 29.5 % — ABNORMAL LOW (ref 36.0–46.0)
Lymphs Abs: 0.3 10*3/uL — ABNORMAL LOW (ref 0.7–4.0)
MCV: 87.8 fL (ref 78.0–100.0)
Monocytes Relative: 3 % (ref 3–12)
Neutro Abs: 2.9 10*3/uL (ref 1.7–7.7)
Platelets: 44 10*3/uL — ABNORMAL LOW (ref 150–400)
RBC: 3.36 MIL/uL — ABNORMAL LOW (ref 3.87–5.11)
WBC: 3.3 10*3/uL — ABNORMAL LOW (ref 4.0–10.5)

## 2013-01-20 LAB — URINALYSIS, ROUTINE W REFLEX MICROSCOPIC
Bilirubin Urine: NEGATIVE
Glucose, UA: NEGATIVE mg/dL
pH: 6 (ref 5.0–8.0)

## 2013-01-20 LAB — URINE MICROSCOPIC-ADD ON

## 2013-01-20 MED ORDER — LOPERAMIDE HCL 2 MG PO CAPS
2.0000 mg | ORAL_CAPSULE | Freq: Once | ORAL | Status: AC
  Administered 2013-01-20: 2 mg via ORAL
  Filled 2013-01-20: qty 1

## 2013-01-20 MED ORDER — BOOST / RESOURCE BREEZE PO LIQD
1.0000 | Freq: Three times a day (TID) | ORAL | Status: DC
  Administered 2013-01-20 (×2): 1 via ORAL

## 2013-01-20 MED ORDER — SODIUM CHLORIDE 0.9 % IV SOLN
INTRAVENOUS | Status: DC
  Administered 2013-01-20 – 2013-01-22 (×5): via INTRAVENOUS
  Filled 2013-01-20 (×6): qty 1000

## 2013-01-20 MED ORDER — POTASSIUM CHLORIDE 10 MEQ/50ML IV SOLN
10.0000 meq | INTRAVENOUS | Status: AC
  Administered 2013-01-20 (×4): 10 meq via INTRAVENOUS
  Filled 2013-01-20 (×4): qty 50

## 2013-01-20 MED ORDER — CALCIUM CARBONATE ANTACID 500 MG PO CHEW
1.0000 | CHEWABLE_TABLET | Freq: Three times a day (TID) | ORAL | Status: DC
  Administered 2013-01-20 – 2013-01-24 (×13): 200 mg via ORAL
  Filled 2013-01-20 (×15): qty 1

## 2013-01-20 NOTE — Progress Notes (Signed)
Nutrition Brief Consult Note  Intervention: - Advanced diet to regular - Encouraged continued excellent meal and supplement intake - Will continue to monitor  Full nutrition assessment completed 01/18/13. Pt meets criteria for severe malnutrition in the context of acute illness as evidenced by <50% estimated energy intake in the past 5 days with 8.5% weight loss in the past 2 weeks. Pt's diet advanced to full liquids today. Met with pt who denies any nausea or vomiting today. Pt reports the Imodium is helping with her diarrhea and reports only 2 episodes today. Pt reports she did well with the cream of chicken soup and vanilla pudding and is ready to try solid foods. MD orders are to advance diet as tolerated, will advance to regular foods, assisted pt with ordering dinner. Pt states she is enjoying the [employer]. Will continue to monitor.   Levon Hedger MS, RD, LDN [phone] Pager [phone] After Hours Pager

## 2013-01-20 NOTE — Progress Notes (Signed)
CRITICAL VALUE ALERT  Critical value received:  Ca of 6.0  Date of notification:  01/20/13  Time of notification:  0708  Critical value read back:yes  Nurse who received alert:  Oneita Jolly, RN  MD notified (1st page):  Dr. Gaylyn Rong  Time of first page:  0711  MD notified (2nd page):  Time of second page:  Responding MD:    Time MD responded:

## 2013-01-20 NOTE — Progress Notes (Signed)
  01/20/2013, 8:44 AM  Hospital day: 3 Antibiotics: cipro DCd Chemotherapy: day 17 cycle 1 gemzar/taxotere Neulasta 01-12-13    Subjective: Feeling a little better. Less nausea, still having diarrhea, able to drink grape juice and eat jello now. Did sleep much better last pm. No SOB at rest and tolerating up to BR. No bleeding. Perirectal area more comfortable with nursing interventions. Comfortable in bed with pillows for back now.  Objective: Vital signs in last 24 hours: Blood pressure 112/50, pulse 88, temperature 98 F (36.7 C), temperature source Oral, resp. rate 16, height 5\' 2"  (1.575 m), weight 97 lb (44 kg), SpO2 97.00%. Awake, alert, looks brighter but still very weak. Oral mucosa dry and bright red from jello. PAC infusing without difficulty, site fine. Lungs clear to bases ant/post. No JVD at 60 degrees.Heart RRR no gallop. Abdomen soft, some bowel sounds, not tender, not more distended. LE trace pedal edema, no cords or tenderness, warm. LUE 1-2+ lymphedema, no cords or tenderness, nodules of in transit porocarcinoma not worse, no erythema or streaking. Significant muscle wasting.  Intake/Output from previous day: 03/06 0701 - 03/07 0700 In: 1145 [P.O.:725; I.V.:407.5; Blood:12.5] Out: 600 [Urine:600] Intake/Output this shift:      Lab Results:  Recent Labs  01/19/13 0815 01/20/13 0600  WBC 1.7* 3.3*  HGB 10.3* 10.3*  HCT 28.7* 29.5*  PLT 15* 44*   BMET  Recent Labs  01/19/13 0815 01/20/13 0600  NA 133* 135  K 3.9 3.0*  CL 101 105  CO2 21 21  GLUCOSE 87 91  BUN 19 12  CREATININE 0.78 0.70  CALCIUM 6.5* 6.0*  albumin 1.9 Urine culture pending  Studies/Results: No results found.   Assessment/Plan: 1. Pancytopenia secondary to recent (dose reduced) chemotherapy: post neulasta 2-27, and 2 phoresis packs platelets and 2 units PRBCs since admission. Neutropenia resolved, hgb stable in adequate range and platelets better this AM tho still low. Add SCD,  continue to hold ASA.   2.progressive porocarcinoma with in transit mets LUE: chemo as above  3.history of CAD, previous cardiomyopathy in 2006 which improved to normal by echo Jan 2010, mild AI and MR, LBBB per Dr Ludwig Clarks note 04-2012. No clinical CHF with fluids and blood products overnight, but need to watch closely. ACE held on admission with dehydration. Will hold AM Coreg with present BP. 4.diarrhea and nausea: appears chemo related, C diff negative. Has not used prn imodium but nausea better so will try that now. Continue IV protonix, zofran/ ativan/ phenergan. Tolerating some clear liquids now.  5.hyponatremia resolved. hypokalemia: related to diarrhea, for IV K runs now and will increase in maintenance IVF. Daily labs. 6.PAC in  7.history of node positive left breast cancer with mastectomy/ left axillary node dissection, chemo and RT. Baseline lymphedema LUE (worse with interventions for the porocarcinoma LUE)  8.history of ovarian cancer, not known active.  9. Osteoporosis with previous vertebral compression fractures and some chronic back and shoulder pain stable 10. Protein calorie malnutrition likely severe. Try Resource, advance diet when tolerates, dietician to evaluate and assist. 11, general debility related to acute on chronic illness. She is not quite ready for PT yet, but likely that will be helpful in next day or so. Order put in. Note she lives alone with son next door.  Not stable for DC. Theadore Blunck P

## 2013-01-21 DIAGNOSIS — E876 Hypokalemia: Secondary | ICD-10-CM

## 2013-01-21 DIAGNOSIS — C44601 Unspecified malignant neoplasm of skin of unspecified upper limb, including shoulder: Secondary | ICD-10-CM

## 2013-01-21 DIAGNOSIS — R197 Diarrhea, unspecified: Secondary | ICD-10-CM

## 2013-01-21 DIAGNOSIS — E871 Hypo-osmolality and hyponatremia: Secondary | ICD-10-CM

## 2013-01-21 LAB — URINE CULTURE
Colony Count: NO GROWTH
Culture: NO GROWTH

## 2013-01-21 LAB — CBC WITH DIFFERENTIAL/PLATELET
Basophils Absolute: 0.1 10*3/uL (ref 0.0–0.1)
Basophils Relative: 1 % (ref 0–1)
Eosinophils Absolute: 0 10*3/uL (ref 0.0–0.7)
Eosinophils Relative: 0 % (ref 0–5)
HCT: 32.5 % — ABNORMAL LOW (ref 36.0–46.0)
Hemoglobin: 11.2 g/dL — ABNORMAL LOW (ref 12.0–15.0)
Lymphs Abs: 0.4 10*3/uL — ABNORMAL LOW (ref 0.7–4.0)
MCH: 30.4 pg (ref 26.0–34.0)
MCHC: 34.5 g/dL (ref 30.0–36.0)
MCV: 88.1 fL (ref 78.0–100.0)
Monocytes Absolute: 0.3 10*3/uL (ref 0.1–1.0)
Neutro Abs: 4.2 10*3/uL (ref 1.7–7.7)

## 2013-01-21 LAB — COMPREHENSIVE METABOLIC PANEL
ALT: 12 U/L (ref 0–35)
AST: 7 U/L (ref 0–37)
Albumin: 1.9 g/dL — ABNORMAL LOW (ref 3.5–5.2)
Alkaline Phosphatase: 47 U/L (ref 39–117)
Chloride: 105 mEq/L (ref 96–112)
Creatinine, Ser: 0.72 mg/dL (ref 0.50–1.10)
Potassium: 4 mEq/L (ref 3.5–5.1)
Sodium: 134 mEq/L — ABNORMAL LOW (ref 135–145)
Total Bilirubin: 0.9 mg/dL (ref 0.3–1.2)

## 2013-01-21 NOTE — Progress Notes (Signed)
CRITICAL VALUE ALERT  Critical value received:  Calcium 6.2  Date of notification:  01/21/2013  Time of notification:  0620am  Critical value read back:yes  Nurse who received alert:  Trellis Paganini  MD notified (1st page):  Dr. Gaylyn Rong  Time of first page:  0625am  MD notified (2nd page): n/a    Time of second page: n/a  Responding MD:  Dr. Gaylyn Rong  Time MD responded: 0630  (no orders received-continue with supplemental calcium already ordered).

## 2013-01-21 NOTE — Evaluation (Signed)
Physical Therapy Evaluation Patient Details Name: Judy Herrera MRN: [id number] DOB: 29-May-1931 Today's Date: 01/21/2013 Time: 4098-1191 PT Time Calculation (min): 15 min  PT Assessment / Plan / Recommendation Clinical Impression  Pt. is 79 yo female admitted 01/18/13 with diarrhea and weakness. h/o cancer, currently receiving treatments. Pt. is weaker than her baseline of total independence. Pt. will benefit from PT while in acute care.    PT Assessment  Patient needs continued PT services    Follow Up Recommendations  Home health PT    Does the patient have the potential to tolerate intense rehabilitation      Barriers to Discharge        Equipment Recommendations  None recommended by PT    Recommendations for Other Services     Frequency Min 3X/week    Precautions / Restrictions Precautions Precautions: Fall Precaution Comments: diarrhea   Pertinent Vitals/Pain HR 100 sats 96RA, 2-3 dyspnea      Mobility  Bed Mobility Bed Mobility: Supine to Sit Supine to Sit: 5: Supervision;HOB flat Transfers Transfers: Sit to Stand;Stand to Sit Sit to Stand: 4: Min guard Stand to Sit: 5: Supervision Ambulation/Gait Ambulation/Gait Assistance: 4: Min assist Ambulation Distance (Feet): 50 Feet Assistive device: Rolling walker Gait Pattern: Step-through pattern Gait velocity: decr    Exercises     PT Diagnosis: Difficulty walking;Generalized weakness  PT Problem List: Decreased strength;Decreased activity tolerance;Decreased mobility;Decreased safety awareness;Decreased knowledge of use of DME PT Treatment Interventions: DME instruction;Gait training;Therapeutic activities;Therapeutic exercise;Patient/family education   PT Goals Acute Rehab PT Goals PT Goal Formulation: With patient/family Time For Goal Achievement: 02/04/13 Potential to Achieve Goals: Good Pt will go Sit to Stand: with modified independence PT Goal: Sit to Stand - Progress: Goal set today Pt will go  Stand to Sit: with modified independence PT Goal: Stand to Sit - Progress: Goal set today Pt will Ambulate: >150 feet;with supervision;with least restrictive assistive device PT Goal: Ambulate - Progress: Goal set today Pt will Perform Home Exercise Program: with supervision, verbal cues required/provided PT Goal: Perform Home Exercise Program - Progress: Goal set today  Visit Information  Last PT Received On: 01/21/13 Assistance Needed: +1    Subjective Data  Subjective: i have been so weak Patient Stated Goal:  to go home   Prior Functioning  Home Living Lives With: Alone Available Help at Discharge: Family Type of Home: House Home Access: Stairs to enter Secretary/administrator of Steps: 1 Home Layout: One level Firefighter: Standard Home Adaptive Equipment: Walker - rolling Prior Function Level of Independence: Independent Able to Take Stairs?: Yes Driving: Yes Communication Communication: No difficulties    Cognition  Cognition Overall Cognitive Status: Appears within functional limits for tasks assessed/performed Arousal/Alertness: Awake/alert Orientation Level: Appears intact for tasks assessed Behavior During Session: [facility] for tasks performed    Extremity/Trunk Assessment Right Upper Extremity Assessment RUE ROM/Strength/Tone: [facility] for tasks assessed Left Upper Extremity Assessment LUE ROM/Strength/Tone: [facility] for tasks assessed Right Lower Extremity Assessment RLE ROM/Strength/Tone: [facility] for tasks assessed Left Lower Extremity Assessment LLE ROM/Strength/Tone: [facility] for tasks assessed   Balance    End of Session PT - End of Session Activity Tolerance: Patient limited by fatigue Patient left: in chair;with call bell/phone within reach;with family/visitor present Nurse Communication: Mobility status  GP     Rada Hay 01/21/2013, 4:27 PM  [phone]

## 2013-01-21 NOTE — Progress Notes (Signed)
Subjective: She is feeling much better this morning with only one episode of diarrhea ear;ier today. Afebrile. No Nausea or vomiting.  Objective: Vital signs in last 24 hours: Temp:  [97.8 F (36.6 C)-98.5 F (36.9 C)] 98.5 F (36.9 C) (03/08 0519) Pulse Rate:  [84-100] 100 (03/08 0519) Resp:  [16-18] 16 (03/08 0519) BP: (121-130)/(60-71) 121/71 mmHg (03/08 0519) SpO2:  [96 %-98 %] 97 % (03/08 0519)  Intake/Output from previous day: 03/07 0701 - 03/08 0700 In: 2152 [P.O.:480; I.V.:1672] Out: 950 [Urine:950] Intake/Output this shift:    General appearance: alert, cooperative and no distress Resp: clear to auscultation bilaterally Cardio: regular rate and rhythm, S1, S2 normal, no murmur, click, rub or gallop GI: soft, non-tender; bowel sounds normal; no masses,  no organomegaly Extremities: extremities normal, atraumatic, no cyanosis or edema  Lab Results:   Recent Labs  01/20/13 0600 01/21/13 0530  WBC 3.3* 5.0  HGB 10.3* 11.2*  HCT 29.5* 32.5*  PLT 44* 51*   BMET  Recent Labs  01/20/13 0600 01/21/13 0530  NA 135 134*  K 3.0* 4.0  CL 105 105  CO2 21 22  GLUCOSE 91 106*  BUN 12 15  CREATININE 0.70 0.72  CALCIUM 6.0* 6.2*    Studies/Results: No results found.  Medications: I have reviewed the patient's current medications.  Assessment/Plan: 1. Pancytopenia secondary to recent (dose reduced) chemotherapy: post neulasta 2-27, 1 phoresis pack platelets 3-5 and 2 units PRBCs overnight. On prophylactic cipro, neutropenic and bleeding precautions. Labs are better today with normal WBCs, improved Hb and platelet count. May resume prophylactic anticoagulation if platelet count continues to improve above 50K. 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 No clinical CHF with  fluids and blood products overnight, but need to watch closely. ACE held on admission with dehydration.  4.diarrhea and nausea: appears chemo related, C diff negative. Only one episode of diarrhea ear;ier today. Has not used prn imodium with nausea last night. Continue IV protonix, zofran/ ativan and have written phenergan at 6.25 - 12.5 mg IV q 6 hr prn. Clear liquids po for now.  5.hyponatremia, hypokalemia: related to diarrhea. Improved.   6.PAC in  7.history of node positive left breast cancer with mastectomy/ left axillary node dissection, chemo and RT. Baseline lymphedema LUE (worse with interventions for the porocarcinoma LUE)  8.history of ovarian cancer, not known active.  9. Osteoporosis with previous vertebral compression fractures and some chronic back and shoulder pain  10. Disposition: likely on Monday as the patient continues to improve.    LOS: 3 days    Judy Duecker K. 01/21/2013

## 2013-01-22 DIAGNOSIS — D61810 Antineoplastic chemotherapy induced pancytopenia: Principal | ICD-10-CM

## 2013-01-22 DIAGNOSIS — T451X5A Adverse effect of antineoplastic and immunosuppressive drugs, initial encounter: Principal | ICD-10-CM

## 2013-01-22 DIAGNOSIS — C4490 Unspecified malignant neoplasm of skin, unspecified: Secondary | ICD-10-CM

## 2013-01-22 LAB — CBC WITH DIFFERENTIAL/PLATELET
Eosinophils Relative: 1 % (ref 0–5)
Lymphs Abs: 0.6 10*3/uL — ABNORMAL LOW (ref 0.7–4.0)
MCH: 30.5 pg (ref 26.0–34.0)
MCV: 88.5 fL (ref 78.0–100.0)
Monocytes Absolute: 0.6 10*3/uL (ref 0.1–1.0)
Platelets: 65 10*3/uL — ABNORMAL LOW (ref 150–400)
RBC: 3.47 MIL/uL — ABNORMAL LOW (ref 3.87–5.11)

## 2013-01-22 LAB — COMPREHENSIVE METABOLIC PANEL
Albumin: 1.6 g/dL — ABNORMAL LOW (ref 3.5–5.2)
BUN: 14 mg/dL (ref 6–23)
CO2: 21 mEq/L (ref 19–32)
Chloride: 103 mEq/L (ref 96–112)
Creatinine, Ser: 0.63 mg/dL (ref 0.50–1.10)
GFR calc non Af Amer: 83 mL/min — ABNORMAL LOW (ref >90)
Total Bilirubin: 0.8 mg/dL (ref 0.3–1.2)

## 2013-01-22 NOTE — Significant Event (Signed)
CRITICAL VALUE ALERT  Critical value received:  Calcium of 6.1  Date of notification:  01/22/2013  Time of notification:  0815  Critical value read back:yes  Nurse who received alert:  Bernita Buffy  MD notified (1st page): Dr. Welton Flakes  Time of first page:  1046  MD notified (2nd page):  Time of second page:  Responding MD:  Dr. Welton Flakes  Time MD responded:  1050  Dr. Welton Flakes did not give any orders.

## 2013-01-22 NOTE — Progress Notes (Signed)
Judy Herrera   DOB:1931/11/15   ZO#:[id number]   JWJ#:[id number]  Subjective: clinically patient seems to be doing a little bit better. She is denying any fevers or chills or night sweats. Stools are slowly improving.   Objective:  Filed Vitals:   01/22/13 0531  BP: 120/63  Pulse: 95  Temp: 98.4 F (36.9 C)  Resp: 18    Body mass index is 17.74 kg/(m^2).  Intake/Output Summary (Last 24 hours) at 01/22/13 1345 Last data filed at 01/22/13 1122  Gross per 24 hour  Intake      0 ml  Output    750 ml  Net   -750 ml     Sclerae unicteric  Oropharynx clear  No peripheral adenopathy  Lungs clear -- no rales or rhonchi  Heart regular rate and rhythm  Abdomen benign  MSK no focal spinal tenderness, no peripheral edema  Neuro nonfocal    CBG (last 3)  No results found for this basename: GLUCAP,  in the last 72 hours   Labs:  Lab Results  Component Value Date   WBC 5.0 01/22/2013   HGB 10.6* 01/22/2013   HCT 30.7* 01/22/2013   MCV 88.5 01/22/2013   PLT 65* 01/22/2013   NEUTROABS 3.6 01/22/2013    Urine Studies No results found for this basename: UACOL, UAPR, USPG, UPH, UTP, UGL, UKET, UBIL, UHGB, UNIT, UROB, ULEU, UEPI, UWBC, URBC, UBAC, CAST, CRYS, UCOM, BILUA,  in the last 72 hours  Basic Metabolic Panel:  Recent Labs Lab 01/18/13 1754 01/19/13 0815 01/20/13 0600 01/21/13 0530 01/22/13 0650  NA 129* 133* 135 134* 131*  K 3.2* 3.9 3.0* 4.0 3.9  CL 94* 101 105 105 103  CO2 26 21 21 22 21   GLUCOSE 91 87 91 106* 96  BUN 25* 19 12 15 14   CREATININE 0.93 0.78 0.70 0.72 0.63  CALCIUM 7.1* 6.5* 6.0* 6.2* 6.1*  MG 2.3  --   --   --   --    GFR Estimated Creatinine Clearance: 39.6 ml/min (by C-G formula based on Cr of 0.63). Liver Function Tests:  Recent Labs Lab 01/18/13 0944 01/19/13 0815 01/20/13 0600 01/21/13 0530 01/22/13 0650  AST 26 10 7 7 8   ALT 35 21 14 12 9   ALKPHOS 47 39 40 47 43  BILITOT 1.75* 2.1* 1.3* 0.9 0.8  PROT 5.0* 4.6* 4.4* 4.4* 3.8*  ALBUMIN  2.2* 2.1* 1.9* 1.9* 1.6*   No results found for this basename: LIPASE, AMYLASE,  in the last 168 hours No results found for this basename: AMMONIA,  in the last 168 hours Coagulation profile No results found for this basename: INR, PROTIME,  in the last 168 hours  CBC:  Recent Labs Lab 01/18/13 0944 01/19/13 0815 01/20/13 0600 01/21/13 0530 01/22/13 0650  WBC 0.6* 1.7* 3.3* 5.0 5.0  NEUTROABS 0.4* 1.3* 2.9 4.2 3.6  HGB 8.1* 10.3* 10.3* 11.2* 10.6*  HCT 23.1* 28.7* 29.5* 32.5* 30.7*  MCV 87.5 85.9 87.8 88.1 88.5  PLT 7 Platelet count confirmed by slide estimate* 15* 44* 51* 65*   Cardiac Enzymes: No results found for this basename: CKTOTAL, CKMB, CKMBINDEX, TROPONINI,  in the last 168 hours BNP: No components found with this basename: POCBNP,  CBG: No results found for this basename: GLUCAP,  in the last 168 hours D-Dimer No results found for this basename: DDIMER,  in the last 72 hours Hgb A1c No results found for this basename: HGBA1C,  in the last 72 hours Lipid  Profile No results found for this basename: CHOL, HDL, LDLCALC, TRIG, CHOLHDL, LDLDIRECT,  in the last 72 hours Thyroid function studies No results found for this basename: TSH, T4TOTAL, FREET3, T3FREE, THYROIDAB,  in the last 72 hours Anemia work up No results found for this basename: VITAMINB12, FOLATE, FERRITIN, TIBC, IRON, RETICCTPCT,  in the last 72 hours Microbiology Recent Results (from the past 240 hour(s))  CLOSTRIDIUM DIFFICILE BY PCR     Status: None   Collection Time    01/18/13  2:29 PM      Result Value Range Status   C difficile by pcr NEGATIVE  NEGATIVE Final  CULTURE, BLOOD (SINGLE)     Status: None   Collection Time    01/19/13  1:25 AM      Result Value Range Status   Specimen Description BLOOD RIGHT ANTECUBITAL   Final   Special Requests BOTTLES DRAWN AEROBIC AND ANAEROBIC 2CC   Final   Culture  Setup Time 01/19/2013 07:34   Final   Culture     Final   Value:        BLOOD CULTURE  RECEIVED NO GROWTH TO DATE CULTURE WILL BE HELD FOR 5 DAYS BEFORE ISSUING A FINAL NEGATIVE REPORT   Report Status PENDING   Incomplete  URINE CULTURE     Status: None   Collection Time    01/20/13 12:41 AM      Result Value Range Status   Specimen Description URINE, CATHETERIZED   Final   Special Requests cipro   Final   Culture  Setup Time 01/20/2013 06:30   Final   Colony Count NO GROWTH   Final   Culture NO GROWTH   Final   Report Status 01/21/2013 FINAL   Final      Studies:  No results found.  Assessment: 79 y.o. 79 yo lady admitted with   with pancytopenia, diarrhea, hypokalemia, hyponatremia and weakness related to gemzar/taxotere given on 01/04/13  Plan:    1. Pancytopenia secondary to recent (dose reduced) chemotherapy: post neulasta 2-27, 1 phoresis pack platelets 3-5 and 2 units PRBCs overnight. On prophylactic cipro, neutropenic and bleeding precautions. Labs are better today with normal WBCs, improved Hb and platelet count. May resume prophylactic anticoagulation if platelet count continues to improve above 50K. 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 No clinical CHF with fluids and blood products overnight, but need to watch closely. ACE held on admission with dehydration.  4.diarrhea and nausea: appears chemo related, C diff negative. Only one episode of diarrhea ear;ier today. Has not used prn imodium with nausea last night. Continue IV protonix, zofran/ ativan and have written phenergan at 6.25 - 12.5 mg IV q 6 hr prn. Clear liquids po for now.  5.hyponatremia, hypokalemia: related to diarrhea. Improved.  6.PAC in  7.history of node positive left breast cancer with mastectomy/ left axillary node dissection, chemo and RT. Baseline lymphedema LUE (worse with interventions for  the porocarcinoma LUE)  8.history of ovarian cancer, not known active.  9. Osteoporosis with previous vertebral compression fractures and some chronic back and shoulder pain  10. Disposition: likely on Monday as the patient continues to improve.     Welton Flakes, KALSOOM 01/22/2013

## 2013-01-23 ENCOUNTER — Inpatient Hospital Stay (HOSPITAL_COMMUNITY): Payer: Medicare Other

## 2013-01-23 DIAGNOSIS — R5381 Other malaise: Secondary | ICD-10-CM

## 2013-01-23 DIAGNOSIS — E46 Unspecified protein-calorie malnutrition: Secondary | ICD-10-CM

## 2013-01-23 LAB — CBC WITH DIFFERENTIAL/PLATELET
Basophils Absolute: 0.1 10*3/uL (ref 0.0–0.1)
Lymphocytes Relative: 13 % (ref 12–46)
Monocytes Relative: 14 % — ABNORMAL HIGH (ref 3–12)
Neutrophils Relative %: 71 % (ref 43–77)
Platelets: 114 10*3/uL — ABNORMAL LOW (ref 150–400)
RDW: 14.2 % (ref 11.5–15.5)
WBC: 5.9 10*3/uL (ref 4.0–10.5)

## 2013-01-23 LAB — COMPREHENSIVE METABOLIC PANEL
AST: 7 U/L (ref 0–37)
Albumin: 1.5 g/dL — ABNORMAL LOW (ref 3.5–5.2)
Alkaline Phosphatase: 40 U/L (ref 39–117)
Chloride: 105 mEq/L (ref 96–112)
Creatinine, Ser: 0.62 mg/dL (ref 0.50–1.10)
Potassium: 3.8 mEq/L (ref 3.5–5.1)
Total Bilirubin: 0.7 mg/dL (ref 0.3–1.2)

## 2013-01-23 LAB — PATHOLOGIST SMEAR REVIEW

## 2013-01-23 MED ORDER — LOPERAMIDE HCL 2 MG PO CAPS: 2.0000 mg | ORAL_CAPSULE | ORAL | Status: DC | PRN

## 2013-01-23 NOTE — Progress Notes (Signed)
  01/23/2013, 8:43 AM  Hospital day: 6 Antibiotics: none Chemotherapy: D20 cycle 1 gemzar/taxotere Neulasta 01-12-13   Subjective: Able to walk to BR this AM. Still weak and is SOB with this exertion, tho denies SOB at rest. Diarrhea x 1 yesterday, none so far today. Slept last pm. Dislikes Resource "too sweet"; can drink chocolate milk and would try CIB. Yesterday ate only bites of egg in AM, crackers at lunch and 1/2 hamburger at supper. No bleeding  Objective: Vital signs in last 24 hours: Blood pressure 128/65, pulse 87, temperature 97.9 F (36.6 C), temperature source Oral, resp. rate 16, height 5\' 2"  (1.575 m), weight 97 lb (44 kg), SpO2 97.00%.  IV still NS with 20 KCL at 75. Intake/Output from previous day: 03/09 0701 - 03/10 0700 In: 120 [Herrera.O.:120] Out: 201 [Urine:200; Stool:1] Intake/Output this shift:    Physical exam: Awake, alert, slightly dyspneic just back to bed from BR. No alopecia. Mouth moist and clear. PAC site ok. Lungs with decreased BS, no wheezes or crackles. No JVD upright. Cor RRR no gallop. Abdomen soft, quiet, not tender. 2+ lymphedema LUE in forearm and several cm above elbow, no erythema or tenderness. No pitting edema or cords LE. Feet warm. No focal neuro deficits.  Lab Results:  Recent Labs  01/22/13 0650 01/23/13 0645  WBC 5.0 5.9  HGB 10.6* 10.1*  HCT 30.7* 29.6*  PLT 65* 114*  ANC 4.1 BMET  Recent Labs  01/22/13 0650 01/23/13 0645  NA 131* 134*  K 3.9 3.8  CL 103 105  CO2 21 23  GLUCOSE 96 100*  BUN 14 13  CREATININE 0.63 0.62  CALCIUM 6.1* 6.0*   T prot 3.7   Alb 1.5 Studies/Results:  CXR pending  Assessment/Plan: 1. Pancytopenia secondary to recent (dose reduced) chemotherapy: post neulasta 2-27, and 2 phoresis packs platelets and 2 units PRBCs since admission. Counts recovering. 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 3.history of CAD,  previous cardiomyopathy in 2006 which improved to normal by echo Jan 2010, mild AI and MR, LBBB per Dr Ludwig Clarks note [phone]. No clinical CHF with fluids and blood products overnight, but need to watch closely. ACE held on admission with dehydration.  4.diarrhea and nausea: secondary to chemo. Improving. 5.hyponatremia, hypokalemia: related to diarrhea, improved. Will DC IVF and lock PAC now.  6.PAC in  7.history of node positive left breast cancer with mastectomy/ left axillary node dissection, chemo and RT. Baseline lymphedema LUE (worse with interventions for the porocarcinoma LUE)  8.history of ovarian cancer, not known active.  9. Osteoporosis with previous vertebral compression fractures and some chronic back and shoulder pain stable  10. Severe malnutrition in context of acute illness:  Po intake poor. I have asked for calorie count, carnation instant breakfast and dietician follow up. 11, general debility related to acute on chronic illness. PT saw over weekend and will need to follow up. She cannot go home alone. I have asked case manager to evaluate also. 12. SOB with exertion likely multifactorial. Will check CXR.   Family not here presently Judy Herrera,Judy Herrera

## 2013-01-23 NOTE — Progress Notes (Signed)
Calorie Count Note  48 hour calorie count ordered. Calorie count started today. Envelope placed by RN on door.   Intervention:  - Chocolate Carnation Instant Breakfast BID - RD to continue to monitor and analyze calorie count  Diet: Regular   3/9  Dinner: 234 calories, 13g protein  3/10 Breakfast: 400 calories, 12g protein Lunch: 230 calories, 7g protein ([employer] biscuit, pinto beans, sweet tea) Dinner: Not arrived yet  Total intake so far today: 630 kcal (47% of minimum estimated needs)  19g protein (35% of minimum estimated needs)  Pt reports eating 50% of meals over the weekend. Pt disliked how sweet Resource Breeze was, ordered chocolate Carnation Instant Breakfast BID instead after discussing issue with pt. Pt denies any nausea/vomiting.   Nutrition Dx: Altered GI function - ongoing, pt with diarrhea yesterday.  Goal:  1. Resolution of diarrhea - not met 2. Pt to consume >90% of meals - not met   Levon Hedger MS, RD, LDN [phone] Pager [phone] After Hours Pager

## 2013-01-23 NOTE — Progress Notes (Signed)
Physical Therapy Treatment Patient Details Name: Judy Herrera MRN: [id number] DOB: 04-27-1931 Today's Date: 01/23/2013 Time: 1530-1550 PT Time Calculation (min): 20 min  PT Assessment / Plan / Recommendation Comments on Treatment Session  pt progressing    Follow Up Recommendations  Home health PT     Does the patient have the potential to tolerate intense rehabilitation     Barriers to Discharge        Equipment Recommendations  None recommended by PT    Recommendations for Other Services    Frequency Min 3X/week   Plan Discharge plan remains appropriate;Frequency remains appropriate    Precautions / Restrictions Precautions Precautions: Fall   Pertinent Vitals/Pain     Mobility  Bed Mobility Bed Mobility: Supine to Sit Supine to Sit: 5: Supervision;HOB flat Transfers Transfers: Sit to Stand;Stand to Sit Sit to Stand: 4: Min guard Stand to Sit: 5: Supervision Details for Transfer Assistance: cues for hands and safety Ambulation/Gait Ambulation/Gait Assistance: 4: Min guard;4: Min Environmental consultant (Feet): 120 Feet Assistive device: Rolling walker Ambulation/Gait Assistance Details: cues for Rw position Gait Pattern: Decreased stride length Gait velocity: decr    Exercises     PT Diagnosis:    PT Problem List:   PT Treatment Interventions:     PT Goals Acute Rehab PT Goals Time For Goal Achievement: 02/04/13 Potential to Achieve Goals: Good Pt will go Sit to Stand: with modified independence PT Goal: Sit to Stand - Progress: Progressing toward goal Pt will go Stand to Sit: with modified independence PT Goal: Stand to Sit - Progress: Progressing toward goal Pt will Ambulate: >150 feet;with supervision;with least restrictive assistive device PT Goal: Ambulate - Progress: Progressing toward goal Pt will Perform Home Exercise Program: with supervision, verbal cues required/provided  Visit Information  Last PT Received On: 01/23/13 Assistance  Needed: +1    Subjective Data  Subjective: i feel bettter Patient Stated Goal:  to go home   Cognition  Cognition Overall Cognitive Status: Appears within functional limits for tasks assessed/performed Arousal/Alertness: Awake/alert Orientation Level: Appears intact for tasks assessed Behavior During Session: [facility] for tasks performed    Balance     End of Session PT - End of Session Activity Tolerance: Patient tolerated treatment well Patient left: in chair;with call bell/phone within reach;with family/visitor present Nurse Communication: Mobility status   GP     [facility] 01/23/2013, 4:38 PM

## 2013-01-24 ENCOUNTER — Other Ambulatory Visit: Payer: Self-pay | Admitting: Oncology

## 2013-01-24 DIAGNOSIS — C4499 Other specified malignant neoplasm of skin, unspecified: Secondary | ICD-10-CM

## 2013-01-24 LAB — BASIC METABOLIC PANEL
BUN: 11 mg/dL (ref 6–23)
CO2: 25 mEq/L (ref 19–32)
GFR calc non Af Amer: 84 mL/min — ABNORMAL LOW (ref >90)
Glucose, Bld: 107 mg/dL — ABNORMAL HIGH (ref 70–99)
Potassium: 3.5 mEq/L (ref 3.5–5.1)

## 2013-01-24 MED ORDER — LOPERAMIDE HCL 2 MG PO CAPS: 2.0000 mg | ORAL_CAPSULE | ORAL | Status: DC | PRN

## 2013-01-24 MED ORDER — HEPARIN SOD (PORK) LOCK FLUSH 100 UNIT/ML IV SOLN
INTRAVENOUS | Status: AC
  Filled 2013-01-24: qty 5

## 2013-01-24 MED ORDER — LOPERAMIDE HCL 2 MG PO CAPS: 2.0000 mg | ORAL_CAPSULE | ORAL | Status: AC | PRN

## 2013-01-24 NOTE — Progress Notes (Signed)
Calorie Count Note  Intervention: - Discussed with pt the importance of nutrition in strength building. Discussed high protein/calorie diet and gave examples of recommended food and snacks. Pt expressed understanding. Being discharged today.   48 hour calorie count ordered. Ended last night  Diet: Regular  3/10 Breakfast: 400 calories, 12g protein Lunch: 230 calories, 7g protein ([employer] biscuit, pinto beans, sweet tea) Dinner: 403 calories, 12g protein Supplements: Did not like Carn[employer] Instant Breakfast  Total intake: 1033 kcal (77% of minimum estimated needs)  31g protein (56% of minimum estimated needs)  Levon Hedger MS, RD, LDN [phone] Pager [phone] After Hours Pager

## 2013-01-24 NOTE — Discharge Summary (Signed)
Physician Discharge Summary  Patient ID: Judy Herrera MRN: [id number] DOB: August 05, 1931   Admit date: 01/18/2013 Discharge date: 01/24/2013    Discharge Diagnoses:  1. Pancytopenia secondary to chemotherapy: neutropenia resolved, anemia improved, thrombocytopenia improved. Post transfusion PRBCs and platelets on admission 2.Diarrhea with dehydration, hyponatremia and hypokalemia: C diff negative and diarrhea appears secondary to chemotherapy, much improved. Dehydration resolved, taking po fluids well. Electrolyte abnormalities improved (BMET pending at time of this transcription and will be reviewed prior to patient leaving hospital).  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 Lesions stable to slightly improved. 4.severe protein calorie malnutrition: po intake improving. 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 8. History of IA clear cell ovarian cancer 2004,treated with surgery and 3 cycles adjuvant taxol carboplatin chemotherapy, not known active 9. Osteoporosis with previous vertebral fractures and chronic shoulder pain 10. PAC in 11.baseline lymphedema LUE 12.small left pleural effusion by CXR: etiology not clear, will follow.  Discharged Condition: guarded but improved   Hospital Course:  For details of presentation, past history, social and family history, ROS and PE on admission, see full H&P.  Patient was admitted from [facility] to inpatient oncology unit with pancytopenia (including ANC 0.4, Hgb 8.1 and platelets 7k), diarrhea, dehydration and hyponatremia/ hypokalemia. She was begun on IV NS with potassium  replacement, transfused 2 units PRBCs and 2 phoresis packs of platelets, given prophylactic ciprofloxacin initially (DCd when ANC out of neutropenic range) and imodium after C diff resulted negative. Blood and urine cultures were negative. Pain was controlled with tramadol. ANC was out of neutropenic range by 01-19-13 (as she had neulasta 01-12-13); hemoglobin increased to ~ 10 with the transfusions and platelets slowly improved, tho without overt bleeding. ASA and prophylactic dose heparin was not used due to thrombocytopenia; she did use SCD after platelets began to improve. PO intake progressively improved, with nutritionist involved; she did meet criteria for severe protein calorie malnutrition. IVF was [facility] on 01-23-13 as she was slightly SOB then, CXR with no CHF or pulmonary infiltrates, did have small left pleural effusion which will be followed, breathing improved subsequently. Lymphedema LUE was at recent baseline by time of DC. PAC functioned well in hospital. Physical therapy was involved during last ~ 3 days in hospital, with improvement noticeable and rolling walker/ home PT recommended. She remained afebrile after initial low grade temperatures.    Consults: physical therapy, nutritionist  Significant Diagnostic Studies:  Bloodwork, CXR 01-23-13  Treatments: IV hydration, correction of electrolyte abnormalities, symptomatic treatment of diarrhea, transfusion PRBCs and platelets, PT  Discharge Exam: Blood pressure 131/59, pulse 82, temperature 97.8 F (36.6 C), temperature source Oral, resp. rate 16, height 5\' 2"  (1.575 m), weight 97 lb (44 kg), SpO2 97.00%. Awake, alert, looks comfortable and generally much better, in bed on RA. No alopecia. PERRL, not icteric. Oral mucosa moist and clear. Neck supple without JVD. PAC site fine, locked. Lungs with breath sounds to lower fields bilaterally, no wheezes or rales. Heart RRR, no gallop. Abdomen soft, not tender, protuberant but not distended, no  appreciable HSM. LE no edema, cords, tenderness. LUE with 1-2+ edema.Nodules of the intransit porocarcinoma less obvious with the slight increase in lymphedema, but  may also be slightly less prominent and no new areas seen. Largest is ~ 4 mm diameter on posterior forearm just below olecranon.Neuro: moves all extremities equally, speech fluent and appropriate, no focal deficits. Skin without other rash or ecchymosis/ petechiae.  Disposition: 01-Home or Self Care  Discharge Orders   Future Appointments Provider Department Dept Phone   05/03/2013 11:00 AM Jeannette Corpus, MD [facility] GYNECOLOGICAL ONCOLOGY [phone]   05/12/2013 9:00 AM [facility] [phone]   Patient should wear two piece clothing and wear no powder or deodorant. Patient should arrive 15 minutes early   07/19/2013 8:30 AM Gordy Savers, MD [facility] [phone]   Future Orders Complete By Expires     Call MD for:  persistant nausea and vomiting  As directed     Call MD for:  temperature >100.4  As directed     Call MD for:  As directed     Comments:      Increased weakness or diarrhea    Diet - low sodium heart healthy  As directed     Discharge instructions  As directed     Comments:      You need an excellent diet as instructed as you are malnourished now. Home physical therapy, use rolling walker.Call prior to scheduled office visit if any concerns. Dr Precious Reel office will call you about follow up appointments on 02-03-13 and 02-15-13.    Increase activity slowly  As directed     Nursing communication  As directed     Comments:      DC PAC access when ready for DC home. Need to know results of AM labs today and to have all arrangements in place for DC before [facility] access removed.        Medication List    STOP taking these medications       dexamethasone 4 MG tablet  Commonly known as:  DECADRON     polyethylene glycol  packet  Commonly known as:  MIRALAX / GLYCOLAX     PRESCRIPTION MEDICATION      TAKE these medications       acyclovir 400 MG tablet  Commonly known as:  ZOVIRAX  Take 400 mg by mouth 3 (three) times daily as needed (for fever blisters.).     amLODipine 2.5 MG tablet  Commonly known as:  NORVASC  Take 2.5 mg by mouth every morning.     aspirin EC 81 MG tablet  Take 81 mg by mouth daily.     CALCIUM + D PO  Take 600 mg by mouth 2 (two) times daily.     carvedilol 25 MG tablet  Commonly known as:  COREG  Take 1 tablet (25 mg total) by mouth 2 (two) times daily with a meal.     Co Q 10 100 MG Caps  Take 1 capsule by mouth daily.     denosumab 60 MG/ML Soln injection  Commonly known as:  PROLIA  Inject 60 mg into the skin every 6 (six) months. Administer in upper arm, thigh, or abdomen     ferrous gluconate 324 MG tablet  Commonly known as:  FERGON  Take 324 mg by mouth 2 (two) times a week. Takes 2 tabs on empty stomach with OJ on Monday and Friday.     HYDROcodone-acetaminophen 5-325 MG per tablet  Commonly known as:  NORCO/VICODIN  Take 1 tablet by mouth every 6 (  six) hours as needed for pain.     lisinopril 20 MG tablet  Commonly known as:  PRINIVIL,ZESTRIL  Take 20 mg by mouth every morning.     loperamide 2 MG capsule  Commonly known as:  IMODIUM  Take 1 capsule (2 mg total) by mouth as needed for diarrhea or loose stools.     LORazepam 0.5 MG tablet  Commonly known as:  ATIVAN  Take 1 tablet under the tongue or swallow every 6 hours as needed for nausea.     ondansetron 8 MG tablet  Commonly known as:  ZOFRAN  Take 8-16 mg by mouth every 12 (twelve) hours as needed for nausea.     simvastatin 40 MG tablet  Commonly known as:  ZOCOR  Take 1 tablet (40 mg total) by mouth at bedtime.     traMADol 50 MG tablet  Commonly known as:  ULTRAM  Take 1 tablet (50 mg total) by mouth every 6 (six) hours as needed for pain.     Vitamin D 2000 UNITS tablet  Take  2,000 Units by mouth daily.       Discussed with RN on unit and with case manager on unit now.  Signed: LIVESAY,LENNIS P 01/24/2013, 8:31 AM

## 2013-01-24 NOTE — Progress Notes (Signed)
Pt selected [facility] for Home Health needs, referral given to Darl Pikes, RN with [facility] in house rep.

## 2013-01-25 LAB — CULTURE, BLOOD (SINGLE)

## 2013-01-27 ENCOUNTER — Emergency Department (HOSPITAL_COMMUNITY): Payer: Medicare Other

## 2013-01-27 ENCOUNTER — Emergency Department (HOSPITAL_COMMUNITY)
Admission: EM | Admit: 2013-01-27 | Discharge: 2013-01-27 | Disposition: A | Discharge status: Home / Self Care | Payer: Medicare Other | Attending: Emergency Medicine | Admitting: Emergency Medicine

## 2013-01-27 DIAGNOSIS — J690 Pneumonitis due to inhalation of food and vomit: Secondary | ICD-10-CM

## 2013-01-27 DIAGNOSIS — I10 Essential (primary) hypertension: Secondary | ICD-10-CM | POA: Insufficient documentation

## 2013-01-27 DIAGNOSIS — Z8543 Personal history of malignant neoplasm of ovary: Secondary | ICD-10-CM | POA: Insufficient documentation

## 2013-01-27 DIAGNOSIS — Z8679 Personal history of other diseases of the circulatory system: Secondary | ICD-10-CM | POA: Insufficient documentation

## 2013-01-27 DIAGNOSIS — Z853 Personal history of malignant neoplasm of breast: Secondary | ICD-10-CM | POA: Insufficient documentation

## 2013-01-27 DIAGNOSIS — Z8739 Personal history of other diseases of the musculoskeletal system and connective tissue: Secondary | ICD-10-CM | POA: Insufficient documentation

## 2013-01-27 DIAGNOSIS — I2510 Atherosclerotic heart disease of native coronary artery without angina pectoris: Secondary | ICD-10-CM | POA: Insufficient documentation

## 2013-01-27 DIAGNOSIS — J189 Pneumonia, unspecified organism: Secondary | ICD-10-CM | POA: Insufficient documentation

## 2013-01-27 DIAGNOSIS — M7989 Other specified soft tissue disorders: Secondary | ICD-10-CM | POA: Insufficient documentation

## 2013-01-27 DIAGNOSIS — Z85828 Personal history of other malignant neoplasm of skin: Secondary | ICD-10-CM | POA: Insufficient documentation

## 2013-01-27 DIAGNOSIS — Z7982 Long term (current) use of aspirin: Secondary | ICD-10-CM | POA: Insufficient documentation

## 2013-01-27 DIAGNOSIS — E785 Hyperlipidemia, unspecified: Secondary | ICD-10-CM | POA: Insufficient documentation

## 2013-01-27 DIAGNOSIS — Z79899 Other long term (current) drug therapy: Secondary | ICD-10-CM | POA: Insufficient documentation

## 2013-01-27 DIAGNOSIS — Z5111 Encounter for antineoplastic chemotherapy: Secondary | ICD-10-CM | POA: Insufficient documentation

## 2013-01-27 DIAGNOSIS — Z8669 Personal history of other diseases of the nervous system and sense organs: Secondary | ICD-10-CM | POA: Insufficient documentation

## 2013-01-27 LAB — CBC WITH DIFFERENTIAL/PLATELET
Basophils Relative: 1 % (ref 0–1)
Eosinophils Absolute: 0.1 10*3/uL (ref 0.0–0.7)
Eosinophils Relative: 1 % (ref 0–5)
HCT: 31.3 % — ABNORMAL LOW (ref 36.0–46.0)
Hemoglobin: 10.5 g/dL — ABNORMAL LOW (ref 12.0–15.0)
Lymphocytes Relative: 5 % — ABNORMAL LOW (ref 12–46)
Monocytes Relative: 8 % (ref 3–12)
Neutrophils Relative %: 85 % — ABNORMAL HIGH (ref 43–77)
RBC: 3.43 MIL/uL — ABNORMAL LOW (ref 3.87–5.11)
WBC: 13.2 10*3/uL — ABNORMAL HIGH (ref 4.0–10.5)

## 2013-01-27 LAB — COMPREHENSIVE METABOLIC PANEL
ALT: 11 U/L (ref 0–35)
AST: 15 U/L (ref 0–37)
Alkaline Phosphatase: 60 U/L (ref 39–117)
CO2: 25 mEq/L (ref 19–32)
Calcium: 7.3 mg/dL — ABNORMAL LOW (ref 8.4–10.5)
Chloride: 100 mEq/L (ref 96–112)
GFR calc non Af Amer: 81 mL/min — ABNORMAL LOW (ref >90)
Potassium: 3.6 mEq/L (ref 3.5–5.1)
Sodium: 132 mEq/L — ABNORMAL LOW (ref 135–145)

## 2013-01-27 MED ORDER — ALBUTEROL SULFATE (5 MG/ML) 0.5% IN NEBU
5.0000 mg | INHALATION_SOLUTION | Freq: Once | RESPIRATORY_TRACT | Status: AC
  Administered 2013-01-27: 5 mg via RESPIRATORY_TRACT
  Filled 2013-01-27: qty 1

## 2013-01-27 MED ORDER — IPRATROPIUM BROMIDE 0.02 % IN SOLN
0.5000 mg | Freq: Once | RESPIRATORY_TRACT | Status: AC
  Administered 2013-01-27: 0.5 mg via RESPIRATORY_TRACT
  Filled 2013-01-27: qty 2.5

## 2013-01-27 MED ORDER — LEVOFLOXACIN 500 MG PO TABS
750.0000 mg | ORAL_TABLET | Freq: Once | ORAL | Status: AC
  Administered 2013-01-27: 750 mg via ORAL
  Filled 2013-01-27: qty 2

## 2013-01-27 MED ORDER — ALBUTEROL SULFATE (5 MG/ML) 0.5% IN NEBU
INHALATION_SOLUTION | RESPIRATORY_TRACT | Status: AC
  Administered 2013-01-27: 13:00:00
  Filled 2013-01-27: qty 1

## 2013-01-27 MED ORDER — MOXIFLOXACIN HCL 400 MG PO TABS: 400.0000 mg | ORAL_TABLET | Freq: Every day | ORAL | Status: DC

## 2013-01-27 MED ORDER — LEVOFLOXACIN IN D5W 750 MG/150ML IV SOLN: 750.0000 mg | Freq: Once | INTRAVENOUS | Status: DC

## 2013-01-27 NOTE — ED Notes (Addendum)
Pt. Brought in at 1206 by [facility] from. Pt was with family member. Pt. Choked and became SOB. Initial pulse oximetry was 77% and rose to 92% with nasal cannula, then rose to 99% with nebulizer. 20 gauge IV in right AC. No access to left arm due to mastectomy. 5mg  albuterol nebulizer administered by EMS.

## 2013-01-27 NOTE — ED Notes (Signed)
Returned from xray

## 2013-01-27 NOTE — ED Notes (Signed)
MD at bedside. 

## 2013-01-27 NOTE — ED Notes (Signed)
Respiratory therapist in to administer breathing treatments.

## 2013-01-27 NOTE — ED Notes (Signed)
Phlebotomy at bedside to collect labs. Pt arrived with IV from EMS

## 2013-01-27 NOTE — ED Notes (Signed)
Went to xray 

## 2013-01-27 NOTE — ED Notes (Signed)
[id number]<AV> Expected date:<BR> Expected time:<BR> Means of arrival:<BR> Comments:<BR> 79yo-SOB after choking this am

## 2013-01-27 NOTE — ED Provider Notes (Addendum)
History     CSN: [id number]  Arrival date & time 01/27/13  1204   First MD Initiated Contact with Patient 01/27/13 1204      Chief Complaint  Patient presents with  . Shortness of Breath    (Consider location/radiation/quality/duration/timing/severity/associated sxs/prior treatment) The history is provided by the patient and the EMS personnel.  Judy Herrera is a 79 y.o. female history of hypertension ovarian cancer and breast cancer, and skin cancer on chemotherapy here presenting with shortness of breath. She was recently admitted for dehydration and gastroenteritis and was hydrated and was sent home 3 days ago. She said since that her legs have been swollen bilaterally and today she was trying to swallow her saliva and also when she felt like she choked on her saliva. She then became very short of breath and per EMS O2 sat was in the 70s. She was tripoding at the time and was given albuterol nebs and now feels better. Denies any fevers or chills or cough at home. Denies any history of heart failure.    Past Medical History  Diagnosis Date  . Hypertension   . Allergy   . Cancer   . Ovarian ca   . Cataract   . Breast CA   . CAD (coronary artery disease)   . Hyperlipidemia   . Cardiomyopathy     improved  . Arthritis   . Osteoporosis     Past Surgical History  Procedure Laterality Date  . Left breast mastectomy  1994    T3 N1 Mx  . Shoulder arthroscopy    . Right leg    . Shoulder surgery      replacement  . Modified radical mastectomy w/ axillary lymph node dissection w/ pectoralis minor excision  1994    Left  . Eye surgery      CATARACTS  . Abdominal hysterectomy  2004  . Cholecystectomy  1993  . Breast surgery      Family History  Problem Relation Age of Onset  . Breast cancer Sister   . Cancer Sister   . Heart disease Sister   . Heart disease Father   . Heart disease Mother     History  Substance Use Topics  . Smoking status: Never Smoker   .  Smokeless tobacco: Never Used  . Alcohol Use: No    OB History   Grav Para Term Preterm Abortions TAB SAB Ect Mult Living                  Review of Systems  Respiratory: Positive for shortness of breath.   All other systems reviewed and are negative.    Allergies  Ibuprofen and Morphine  Home Medications   Current Outpatient Rx  Name  Route  Sig  Dispense  Refill  . acyclovir (ZOVIRAX) 400 MG tablet   Oral   Take 400 mg by mouth 3 (three) times daily as needed (for fever blisters.).         Marland Kitchen amLODipine (NORVASC) 2.5 MG tablet   Oral   Take 2.5 mg by mouth every morning.         Marland Kitchen aspirin EC 81 MG tablet   Oral   Take 81 mg by mouth at bedtime.          . Calcium Carbonate-Vitamin D (CALCIUM + D PO)   Oral   Take 600 mg by mouth daily after lunch.          . carvedilol (  COREG) 25 MG tablet   Oral   Take 1 tablet (25 mg total) by mouth 2 (two) times daily with a meal.   180 tablet   5     Needs to be seen for future refills - last seen 1/ ...   . Cholecalciferol (VITAMIN D) 2000 UNITS tablet   Oral   Take 2,000 Units by mouth daily.         . Coenzyme Q10 (CO Q 10) 100 MG CAPS   Oral   Take 1 capsule by mouth daily.         Marland Kitchen denosumab (PROLIA) 60 MG/ML SOLN injection   Subcutaneous   Inject 60 mg into the skin every 6 (six) months. Administer in upper arm, thigh, or abdomen         . ferrous gluconate (FERGON) 324 MG tablet   Oral   Take 324 mg by mouth 2 (two) times a week. Takes 2 tabs on empty stomach with OJ on Monday and Friday.         Marland Kitchen HYDROcodone-acetaminophen (NORCO/VICODIN) 5-325 MG per tablet   Oral   Take 1 tablet by mouth every 6 (six) hours as needed for pain.         Marland Kitchen lisinopril (PRINIVIL,ZESTRIL) 20 MG tablet   Oral   Take 20 mg by mouth at bedtime.          Marland Kitchen loperamide (IMODIUM) 2 MG capsule   Oral   Take 1 capsule (2 mg total) by mouth as needed for diarrhea or loose stools.   20 capsule   0   .  LORazepam (ATIVAN) 0.5 MG tablet      Take 1 tablet under the tongue or swallow every 6 hours as needed for nausea.   20 tablet   0   . ondansetron (ZOFRAN) 8 MG tablet   Oral   Take 8-16 mg by mouth every 12 (twelve) hours as needed for nausea.         . simvastatin (ZOCOR) 40 MG tablet   Oral   Take 1 tablet (40 mg total) by mouth at bedtime.   90 tablet   4     Must be seen for refills- last seen 11/2010   . traMADol (ULTRAM) 50 MG tablet   Oral   Take 1 tablet (50 mg total) by mouth every 6 (six) hours as needed for pain.   60 tablet   4     Must be seen for future refills- last seen 11/2010     BP 115/69  Pulse 98  Temp(Src) 97.5 F (36.4 C) (Oral)  SpO2 94%  Physical Exam  Nursing note and vitals reviewed. Constitutional: She is oriented to person, place, and time.  Chronically ill, well appearing. Slightly tachypneic, but talking in full sentences.   HENT:  Head: Normocephalic.  Mouth/Throat: Oropharynx is clear and moist.  Eyes: Conjunctivae are normal. Pupils are equal, round, and reactive to light.  Neck: Normal range of motion. Neck supple.  Cardiovascular: Normal rate, regular rhythm and normal heart sounds.   Pulmonary/Chest:  Slightly tachypneic. Dec breath sounds bilateral bases, no wheezing or crackles. Mod air movement. No accessory muscle use.   Abdominal: Soft. Bowel sounds are normal. She exhibits no distension. There is no tenderness. There is no rebound and no guarding.  Musculoskeletal: Normal range of motion. She exhibits no tenderness.  2+ edema bilateral legs, no calf tenderness   Neurological: She is alert and oriented to person,  place, and time.  Skin: Skin is warm and dry.  Psychiatric: She has a normal mood and affect. Her behavior is normal. Judgment and thought content normal.    ED Course  Procedures (including critical care time)  Labs Reviewed  CBC WITH DIFFERENTIAL - Abnormal; Notable for the following:    WBC 13.2 (*)     RBC 3.43 (*)    Hemoglobin 10.5 (*)    HCT 31.3 (*)    Neutrophils Relative 85 (*)    Lymphocytes Relative 5 (*)    Neutro Abs 11.2 (*)    Monocytes Absolute 1.1 (*)    All other components within normal limits  COMPREHENSIVE METABOLIC PANEL - Abnormal; Notable for the following:    Sodium 132 (*)    Glucose, Bld 151 (*)    Calcium 7.3 (*)    Total Protein 4.3 (*)    Albumin 1.9 (*)    GFR calc non Af Amer 81 (*)    All other components within normal limits  PRO B NATRIURETIC PEPTIDE - Abnormal; Notable for the following:    Pro B Natriuretic peptide (BNP) 918.6 (*)    All other components within normal limits  TROPONIN I   Dg Chest 2 View  01/27/2013  *RADIOLOGY REPORT*  Clinical Data: Shortness of breath, breast cancer  CHEST - 2 VIEW  Comparison: 01/23/2013  Findings: Chronic interstitial markings/emphysematous changes. Small bilateral pleural effusions, left greater than right.  Right lower lobe opacity, likely atelectasis.  No pneumothorax.  The heart is normal in size.  Right chest power port.  Surgical clips in the left chest wall / axilla.  Left shoulder arthroplasty.  IMPRESSION: Small bilateral pleural effusions, left greater than right.  Right lower lobe opacity, likely atelectasis.   Original Report Authenticated By: Charline Bills, M.D.      No diagnosis found.   Date: 01/27/2013  Rate: 93  Rhythm: normal sinus rhythm  QRS Axis: normal  Intervals: normal  ST/T Wave abnormalities: nonspecific ST changes  Conduction Disutrbances:left bundle branch block  Narrative Interpretation:   Old EKG Reviewed: unchanged    MDM  Peggye Pitt Woolworth is a 79 y.o. female here with shortness of breath. I think she may have aspiration event vs bronchitis vs CHF from given IVF during previous admission. Will get labs, BNP, CXR and reassess.   2:57 PM Patient felt better. Never hypoxic here. WBC 13, labs otherwise unremarkable. CXR showed ? R lower lobe opacity. BNP 900, no  previous. I don't think she is in heart failure. I think she is volume overloaded slightly and she may have aspiration pneumonia. I gave her levaquin here and will d/c on avelox. I gave her the option of taking low dose lasix to remove the fluid, but I told her that it may dehydrate her again (last admission was for dehydration). We decided that she will restrict her fluid intact and will f/u with PMD.   3:08 PM I discussed with Dr. Ninfa Linden nurse and updated her. She will f/u early next week.      Richardean Canal, MD 01/27/13 1500  Richardean Canal, MD 01/27/13 [phone]

## 2013-01-31 ENCOUNTER — Telehealth: Payer: Self-pay | Admitting: Oncology

## 2013-01-31 ENCOUNTER — Ambulatory Visit: Payer: Medicare Other | Admitting: Internal Medicine

## 2013-02-02 ENCOUNTER — Encounter: Payer: Self-pay | Admitting: Internal Medicine

## 2013-02-02 ENCOUNTER — Ambulatory Visit (INDEPENDENT_AMBULATORY_CARE_PROVIDER_SITE_OTHER): Payer: Medicare Other | Admitting: Internal Medicine

## 2013-02-02 VITALS — BP 128/60 | HR 72 | Temp 97.4°F | Resp 20 | Wt 109.0 lb

## 2013-02-02 DIAGNOSIS — C4499 Other specified malignant neoplasm of skin, unspecified: Secondary | ICD-10-CM

## 2013-02-02 DIAGNOSIS — I10 Essential (primary) hypertension: Secondary | ICD-10-CM

## 2013-02-02 DIAGNOSIS — I2510 Atherosclerotic heart disease of native coronary artery without angina pectoris: Secondary | ICD-10-CM

## 2013-02-02 MED ORDER — FUROSEMIDE 20 MG PO TABS: 20.0000 mg | ORAL_TABLET | Freq: Every day | ORAL | Status: DC

## 2013-02-02 NOTE — Progress Notes (Signed)
Subjective:    Patient ID: Judy Herrera, female    DOB: 06-06-1931, 79 y.o.   MRN: [id number]  HPI  79 year old patient who is seen today following her hospital discharge approximately 9 days ago. She had an approximate five-day inpatient stay for chemotherapy associated complications with neutropenia. 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  She generally feels well without fever or chills.  She feels her appetite is well maintained. She states that she has developed a progressive peripheral edema since her hospital discharge.   Hospital records reviewed and she has significan hypoalbuminemia. Denies any shortness of breath.  Medical regimen includes modest dose of amlodipine.  Hospital records reviewed  Wt Readings from Last 3 Encounters:  02/02/13 109 lb (49.442 kg)  01/18/13 97 lb (44 kg)  01/18/13 92 lb 3.2 oz (41.822 kg)    Past Medical History  Diagnosis Date  . Hypertension   . Allergy   . Cancer   . Ovarian ca   . Cataract   . Breast CA   . CAD (coronary artery disease)   . Hyperlipidemia   . Cardiomyopathy     improved  . Arthritis   . Osteoporosis     History   Social History  . Marital Status: Widowed    Spouse Name: N/A    Number of Children: N/A  . Years of Education: N/A   Occupational History  . Not on file.   Social History Main Topics  . Smoking status: Never Smoker   . Smokeless tobacco: Never Used  . Alcohol Use: No  . Drug Use: No  . Sexually Active: Not on file     Comment: didn't ask   Other Topics Concern  . Not on file   Social History Narrative  . No narrative on file    Past Surgical History  Procedure Laterality Date  . Left breast mastectomy  1994    T3 N1 Mx  . Shoulder arthroscopy    . Right leg    . Shoulder surgery      replacement  . Modified radical mastectomy w/ axillary lymph node dissection w/ pectoralis minor excision  1994    Left  . Eye surgery     CATARACTS  . Abdominal hysterectomy  2004  . Cholecystectomy  1993  . Breast surgery      Family History  Problem Relation Age of Onset  . Breast cancer Sister   . Cancer Sister   . Heart disease Sister   . Heart disease Father   . Heart disease Mother     Allergies  Allergen Reactions  . Ibuprofen Nausea Only  . Morphine Nausea And Vomiting    Current Outpatient Prescriptions on File Prior to Visit  Medication Sig Dispense Refill  . acyclovir (ZOVIRAX) 400 MG tablet Take 400 mg by mouth 3 (three) times daily as needed (for fever blisters.).      Marland Kitchen amLODipine (NORVASC) 2.5 MG tablet Take 2.5 mg by mouth every morning.      Marland Kitchen aspirin EC 81 MG tablet Take 81 mg by mouth at bedtime.       . Calcium Carbonate-Vitamin D (CALCIUM + D PO) Take 600 mg by mouth daily after lunch.       . carvedilol (COREG) 25 MG tablet Take 1 tablet (25 mg total) by mouth 2 (two) times daily with a meal.  180 tablet  5  . Cholecalciferol (VITAMIN D) 2000  UNITS tablet Take 2,000 Units by mouth daily.      . Coenzyme Q10 (CO Q 10) 100 MG CAPS Take 1 capsule by mouth daily.      Marland Kitchen denosumab (PROLIA) 60 MG/ML SOLN injection Inject 60 mg into the skin every 6 (six) months. Administer in upper arm, thigh, or abdomen      . ferrous gluconate (FERGON) 324 MG tablet Take 324 mg by mouth 2 (two) times a week. Takes 2 tabs on empty stomach with OJ on Monday and Friday.      Marland Kitchen HYDROcodone-acetaminophen (NORCO/VICODIN) 5-325 MG per tablet Take 1 tablet by mouth every 6 (six) hours as needed for pain.      Marland Kitchen lisinopril (PRINIVIL,ZESTRIL) 20 MG tablet Take 20 mg by mouth at bedtime.       Marland Kitchen loperamide (IMODIUM) 2 MG capsule Take 1 capsule (2 mg total) by mouth as needed for diarrhea or loose stools.  20 capsule  0  . LORazepam (ATIVAN) 0.5 MG tablet Take 1 tablet under the tongue or swallow every 6 hours as needed for nausea.  20 tablet  0  . moxifloxacin (AVELOX) 400 MG tablet Take 1 tablet (400 mg total) by mouth  daily.  7 tablet  0  . ondansetron (ZOFRAN) 8 MG tablet Take 8-16 mg by mouth every 12 (twelve) hours as needed for nausea.      . simvastatin (ZOCOR) 40 MG tablet Take 1 tablet (40 mg total) by mouth at bedtime.  90 tablet  4  . traMADol (ULTRAM) 50 MG tablet Take 1 tablet (50 mg total) by mouth every 6 (six) hours as needed for pain.  60 tablet  4   No current facility-administered medications on file prior to visit.    BP 128/60  Pulse 72  Temp(Src) 97.4 F (36.3 C) (Oral)  Resp 20  Wt 109 lb (49.442 kg)  BMI 19.93 kg/m2  SpO2 92%       Review of Systems  HENT: Negative for hearing loss, congestion, sore throat, rhinorrhea, dental problem, sinus pressure and tinnitus.   Eyes: Negative for pain, discharge and visual disturbance.  Respiratory: Negative for cough and shortness of breath.   Cardiovascular: Positive for leg swelling. Negative for chest pain and palpitations.  Gastrointestinal: Negative for nausea, vomiting, abdominal pain, diarrhea, constipation, blood in stool and abdominal distention.  Genitourinary: Negative for dysuria, urgency, frequency, hematuria, flank pain, vaginal bleeding, vaginal discharge, difficulty urinating, vaginal pain and pelvic pain.  Musculoskeletal: Negative for joint swelling, arthralgias and gait problem.  Skin: Negative for rash.  Neurological: Positive for weakness. Negative for dizziness, syncope, speech difficulty, numbness and headaches.  Hematological: Negative for adenopathy.  Psychiatric/Behavioral: Negative for behavioral problems, dysphoric mood and agitation. The patient is not nervous/anxious.        Objective:   Physical Exam  Constitutional: She is oriented to person, place, and time. She appears well-developed and well-nourished.  Blood pressure 110/60  HENT:  Head: Normocephalic.  Right Ear: External ear normal.  Left Ear: External ear normal.  Mouth/Throat: Oropharynx is clear and moist.  Eyes: Conjunctivae and EOM  are normal. Pupils are equal, round, and reactive to light.  Neck: Normal range of motion. Neck supple. No thyromegaly present.  Cardiovascular: Normal rate, regular rhythm, normal heart sounds and intact distal pulses.   Pulmonary/Chest: Effort normal and breath sounds normal. No respiratory distress. She has no rales.  O2 saturation 96  Abdominal: Soft. Bowel sounds are normal. She exhibits no mass.  There is no tenderness.  Musculoskeletal: Normal range of motion. She exhibits edema.  Prominent pitting edema of the lower extremities As well as chronic swelling of the left forearm  Lymphadenopathy:    She has no cervical adenopathy.  Neurological: She is alert and oriented to person, place, and time.  Skin: Skin is warm and dry. No rash noted.  PAC noted right anterior chest  Psychiatric: She has a normal mood and affect. Her behavior is normal.          Assessment & Plan:  Lower extremity edema. Multifactorial -we'll discontinue the amlodipine and place on modest dose furosemide since this seems to be worsening. We'll continue restricted salt diet Hypertension. Blood pressure low normal we'll discontinue amlodipine that may be aggravating the peripheral edema CAD stable  Followup oncology in the morning as scheduled

## 2013-02-02 NOTE — Patient Instructions (Signed)
Follow up oncology  Limit your sodium (Salt) intake  Keep legs elevated as much as possible  Discontinue amlodipine

## 2013-02-03 ENCOUNTER — Encounter: Payer: Self-pay | Admitting: Physician Assistant

## 2013-02-03 ENCOUNTER — Telehealth: Payer: Self-pay | Admitting: Dietician

## 2013-02-03 ENCOUNTER — Ambulatory Visit (HOSPITAL_BASED_OUTPATIENT_CLINIC_OR_DEPARTMENT_OTHER): Payer: Medicare Other | Admitting: Physician Assistant

## 2013-02-03 ENCOUNTER — Other Ambulatory Visit (HOSPITAL_BASED_OUTPATIENT_CLINIC_OR_DEPARTMENT_OTHER): Payer: Medicare Other | Admitting: Lab

## 2013-02-03 VITALS — BP 127/65 | HR 81 | Temp 97.4°F | Resp 18 | Ht 62.0 in | Wt 104.9 lb

## 2013-02-03 DIAGNOSIS — E871 Hypo-osmolality and hyponatremia: Secondary | ICD-10-CM

## 2013-02-03 DIAGNOSIS — D649 Anemia, unspecified: Secondary | ICD-10-CM

## 2013-02-03 DIAGNOSIS — C4499 Other specified malignant neoplasm of skin, unspecified: Secondary | ICD-10-CM

## 2013-02-03 DIAGNOSIS — D61810 Antineoplastic chemotherapy induced pancytopenia: Secondary | ICD-10-CM

## 2013-02-03 DIAGNOSIS — T451X5A Adverse effect of antineoplastic and immunosuppressive drugs, initial encounter: Secondary | ICD-10-CM

## 2013-02-03 LAB — CBC WITH DIFFERENTIAL/PLATELET
Eosinophils Absolute: 0.3 10*3/uL (ref 0.0–0.5)
HCT: 31.2 % — ABNORMAL LOW (ref 34.8–46.6)
LYMPH%: 7.9 % — ABNORMAL LOW (ref 14.0–49.7)
MCHC: 33.9 g/dL (ref 31.5–36.0)
MCV: 92.4 fL (ref 79.5–101.0)
MONO#: 0.7 10*3/uL (ref 0.1–0.9)
NEUT#: 6.1 10*3/uL (ref 1.5–6.5)
NEUT%: 77.6 % — ABNORMAL HIGH (ref 38.4–76.8)
Platelets: 266 10*3/uL (ref 145–400)
WBC: 7.8 10*3/uL (ref 3.9–10.3)

## 2013-02-03 LAB — COMPREHENSIVE METABOLIC PANEL (CC13)
CO2: 33 mEq/L — ABNORMAL HIGH (ref 22–29)
Creatinine: 0.8 mg/dL (ref 0.6–1.1)
Glucose: 99 mg/dl (ref 70–99)
Total Bilirubin: 0.87 mg/dL (ref 0.20–1.20)

## 2013-02-03 NOTE — Patient Instructions (Addendum)
Continue to increase your by mouth intake of food as instructed when you were discharged from the hospital Follow up with Dr. Darrold Span as scheduled on 02/15/2013

## 2013-02-03 NOTE — Progress Notes (Signed)
OFFICE PROGRESS NOTE   02/03/2013   Physicians: P.Kwaitkowski, P.Drusilla Kanner, F.Lupton, B.Crenshaw, M.Martin, [facility]  INTERVAL HISTORY:   Patient is seen, alone for a hospital followup visit. She is receiving chemotherapy in [place], this for porocarcinoma involving LUE. Well known to Dr. Darrold Span since node postitve breast cancer and subsequent early stage ovarian cancer, neither known active now. She status post 1 cycle of systemic chemotherapy with gemcitabine and docetaxel. Gemcitabine is given on days 1 and 8 and was given on 01/04/2013 at a dose of 800 mg per meter squared and given on 226 at 650 mg per meter squared with docetaxel at 60 mg per meter squared. She was admitted from the [facility] on 01/18/2013 with diarrhea, dehydration, hyponatremia and hypokalemia. She was transfused both packed red blood cells and phoresed platelets while hospitalized as well as prophylacticly covered with ciprofloxacin until her ANC was out of the neutropenic range. She reports that she went to the emergency room on 01/27/2013 because of difficulty breathing. She was given a breathing treatment in the ambulance on the way to the hospital and discharged from the emergency room with a 10 day course of Avelox. She reports that she is 3-4 days with a prescription remaining. She had a visit with PCP Dr Frederica Kuster yesterday and he placed her on Lasix for some lower extremity edema. She is to see Dr Yolande Jolly in June.  Patient was diagnosed with porocarcinoma involving 3 areas left hand and arm in spring 2012 by Dr.Lupton. She had re-excisions of all areas (dorsum left hand, 2 on left forearm and left arm) by Dr Herbie Saxon. She had significant lymphedema LUE after those procedures, with history of left breast cancer with 20 node left axillary dissection in 1994 followed by radiation. Repeat scans at [facility] in 05-2012 reportedly had no other distant disease. Because of progressive in  transit mets, she had hyperthermic limb infusion by Dr Lenis Noon at [facility] in 06-2012 (chemotherapeutic agent not in notes available to me); she also had excision of an area on dorsum of left hand in Oct 2013. She had significant swelling of LUE after the limb perfusion, which gradually improved.She had progression of the in transit mets LUE since the limb perfusion, which are not symptomatic. She was seen in consultation by Dr Orvilla Fus at [facility], with recommendation for gemzar/taxotere as systemic sarcoma regimen, chosen in part due to her prior chemotherapy regimens. [facility] notes report no evidence of disease on CXR there on 11-23-2012, and their last CT CAP 06-15-12 no distant disease; she has not had more recent imaging in [facility] system. She did not receive day 1 cycle 1 gemzar as planned at [facility] last week due to need for precertification; my office is in process of getting the precert changed to our facitily. Tho she did not use central catheter for previous chemotherapy, with prior chemo, risk of gemzar irritation and as access is limited to just RUE, she is status post PAC placed by IR on 01-02-13.  PAC was used for day 1 of chemotherapy on 01/04/13.   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 She saw Dr Yolande Jolly in June 2013 and has year follow up with him.  Her left breast cancer was T3N1 (2 of 20 nodes) ER+ in 1994, treated with mastectomy and axillary nodes, adriamycin/cytoxan x4, RT, and tamoxifen x 5 years. Last mammograms were at [facility] 05-12-12; she would like to have  mammograms done now at [facility].  .   Today she feels well and has had no further episodes of diarrhea. The diuretic that her primary care physician started her on has helped with the lower extremity edema as well as caused her to lose some weight. In Dr. Vernon Prey office yesterday she weighed 109 pounds and in our office today she's 104.9 pounds having  are really started to Lasix. She reports no further episodes of breathing difficulty. She reports that she is eating better and is paying attention to eat more protein as she was instructed when she was discharged from the hospital. She initially was having some lower extremity discomfort because of the edema but this has begun to improve after starting the Lasix prescribed by Dr. Amador Cunas. She denied any further episodes of diarrhea. No SOB or cough. No other pain. No recent fever or symptoms of infection. Continued slight swelling LUE now, mostly around elbow. No noted changes right breast or left mastectomy scan. No abdominal or pelvic symptoms. No swelling LE.  Remainder of 10 point Review of Systems negative.  Objective:  Vital signs in last 24 hours:  BP 127/65  Pulse 81  Temp(Src) 97.4 F (36.3 C) (Oral)  Resp 18  Ht 5\' 2"  (1.575 m)  Wt 104 lb 14.4 oz (47.582 kg)  BMI 19.18 kg/m2   Weight is up 7.9 lbs from 01/18/2013. Alert, easily ambulatory, NAD, very pleasant.  HEENT:PERRLA, extra ocular movement intact, sclera clear, anicteric and oropharynx clear, no lesions. Normal hair pattern. LymphaticsCervical, supraclavicular, and axillary nodes normal. No inguinal adenopathy. Resp: clear to auscultation bilaterally and normal percussion bilaterally Cardio: regular rate and rhythm GI: soft, non-tender; bowel sounds normal; no masses,  no organomegaly Extremities: RUE  without edema, cords, tenderness. Bilateral lower extremities with 2+ putting edema, without warmth point tenderness or palpable cords LUE with at least 18 scattered in transit lesions on forearm, most 1-3 mm and one more superiorly 5 mm diameter, also several areas up to 5-7 m above elbow. No apparent swelling now in left hand or wrist, and 1+ around elbow without erythema or heat. None of the areas are tender. Neuro: nonfocal Port-A-Cath in place right anterior chest-nontender, no erythema and no evidence of  infection   Lab Results:  Results for orders placed in visit on 02/03/13  CBC WITH DIFFERENTIAL      Result Value Range   WBC 7.8  3.9 - 10.3 10e3/uL   NEUT# 6.1  1.5 - 6.5 10e3/uL   HGB 10.6 (*) 11.6 - 15.9 g/dL   HCT 09.8 (*) 11.9 - 14.7 %   Platelets 266  145 - 400 10e3/uL   MCV 92.4  79.5 - 101.0 fL   MCH 31.3  25.1 - 34.0 pg   MCHC 33.9  31.5 - 36.0 g/dL   RBC 8.29 (*) 5.62 - 1.30 10e6/uL   RDW 15.2 (*) 11.2 - 14.5 %   lymph# 0.6 (*) 0.9 - 3.3 10e3/uL   MONO# 0.7  0.1 - 0.9 10e3/uL   Eosinophils Absolute 0.3  0.0 - 0.5 10e3/uL   Basophils Absolute 0.2 (*) 0.0 - 0.1 10e3/uL   NEUT% 77.6 (*) 38.4 - 76.8 %   LYMPH% 7.9 (*) 14.0 - 49.7 %   MONO% 8.5  0.0 - 14.0 %   EOS% 3.6  0.0 - 7.0 %   BASO% 2.4 (*) 0.0 - 2.0 %  COMPREHENSIVE METABOLIC PANEL (CC13)      Result Value Range   Sodium  141  136 - 145 mEq/L   Potassium 3.3 (*) 3.5 - 5.1 mEq/L   Chloride 100  98 - 107 mEq/L   CO2 33 (*) 22 - 29 mEq/L   Glucose 99  70 - 99 mg/dl   BUN 9.7  7.0 - 16.1 mg/dL   Creatinine 0.8  0.6 - 1.1 mg/dL   Total Bilirubin 0.96  0.20 - 1.20 mg/dL   Alkaline Phosphatase 65  40 - 150 U/L   AST 12  5 - 34 U/L   ALT 11  0 - 55 U/L   Total Protein 4.8 (*) 6.4 - 8.3 g/dL   Albumin 2.2 (*) 3.5 - 5.0 g/dL   Calcium 8.1 (*) 8.4 - 10.4 mg/dL    Last iron studies in this system were in Nov 2013 with serum iron 79, %sat 27 and ferritin 44  Studies/Results:  Plan is for repeat body CTs with visit to Dr Lin Givens after third cycle of gem/taxotere  Medications: I have reviewed the patient's current medications.  Patient discussed with Dr. Darrold Span  Patient to continue her Lasix as prescribed by Dr. Amador Cunas for her lower extremity edema. She is encouraged to follow the dietary instructions that she was given at hospital discharge. We'll also arrange for her to meet with our dietitian when she returns to see Dr. Darrold Span as scheduled on 02/15/2013. Chemotherapy in his current form is currently on  hold due to her significant pancytopenia and subsequent diarrhea leading to dehydration hyponatremia and hypokalemia.  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 Specific chemotherapy subject to change depending a followup appointment with Dr. Darrold Span on 02/15/2013. 2. Mild anemia: iron deficiency in 2012, with iron studies improved recently. She is on oral iron ~ twice weekly. Previous B12 and folate ok. Negative colonoscopy march 2012. As stated above patient received packed red blood cell transfusion while hospitalized as a result of her chemotherapy induced pancytopenia. 3.history of node positive left breast cancer: history as above, not known recurrent. Has had problems with swelling LUE following procedures for the porocarcinoma, in part related to 20 node axillary dissection and RT for the breast cancer. Mammograms likely in [facility] June 2014 (previously [place]) 4.history of high grade 1A ovarian carcinoma: history as above, not known recurrent, followed by gyn oncology yearly now. 5.inadequate peripheral IV access status post PAC by IR 01-02-13. Nontender and without evidence of infection 6.osteoporosis with vertebral compression fracture 2012 7. Cardiomyopathy out of proportion to CAD at cath 2006, normal LVF by echo March 2012. LBBB. Elevated lipids. Known to Dr Jens Som. 8. Flu vaccine done    Conni Slipper, PA-C   02/03/2013, 11:42 AM

## 2013-02-03 NOTE — Telephone Encounter (Signed)
Brief Outpatient Oncology Nutrition Note  Patient has been identified to be at risk on malnutrition screen.   Wt Readings from Last 10 Encounters:  02/03/13 104 lb 14.4 oz (47.582 kg)  02/02/13 109 lb (49.442 kg)  01/18/13 97 lb (44 kg)  01/18/13 92 lb 3.2 oz (41.822 kg)  01/16/13 101 lb (45.813 kg)  01/09/13 99 lb 12.8 oz (45.269 kg)  12/30/12 100 lb 3.2 oz (45.45 kg)  11/15/12 99 lb (44.906 kg)  09/30/12 102 lb 12.8 oz (46.63 kg)  07/12/12 105 lb (47.628 kg)    Patient seen by inpatient RD 01/18/13 with dx of severe malnutrition.  Patient was having diarrhea.  Called patient and left a message with RD contact info to call if she has any questions.  Oran Rein, RD, LDN

## 2013-02-07 ENCOUNTER — Telehealth: Payer: Self-pay | Admitting: Oncology

## 2013-02-07 ENCOUNTER — Telehealth: Payer: Self-pay

## 2013-02-07 DIAGNOSIS — E876 Hypokalemia: Secondary | ICD-10-CM

## 2013-02-07 MED ORDER — POTASSIUM CHLORIDE ER 10 MEQ PO TBCR: EXTENDED_RELEASE_TABLET | ORAL | Status: DC

## 2013-02-07 NOTE — Telephone Encounter (Addendum)
Spoke with Ms. Judy Herrera and told her the results of labs as noted below by Dr. Darrold Span.  Dietitian Judy Herrera spoke with Patient yesterday and she told Judy Herrera that she was eating much Better.  Fowarded Dr. Precious Reel note  to [facility] so she will follow up with Ms. Judy Herrera.

## 2013-02-07 NOTE — Telephone Encounter (Signed)
Message copied by Lorine Bears on Tue Feb 07, 2013 11:07 AM ------      Message from: Reece Packer      Created: Fri Feb 03, 2013  8:51 PM       Labs seen and need follow up: Please let her know that her potassium is just a little low. Add potassium 10 mEq daily x 7 days. Her protein and albumin are a little better but still low - please see if Lesle Reek could talk with her by phone before hopefully she meets with her at next MD appointment. ------

## 2013-02-08 ENCOUNTER — Telehealth: Payer: Self-pay

## 2013-02-08 NOTE — Telephone Encounter (Signed)
Faxed signed orders dated 02-08-13 to [facility] for phycical therapy.  Send a copy to HIM to be scanned into EMR.

## 2013-02-15 ENCOUNTER — Telehealth: Payer: Self-pay | Admitting: Oncology

## 2013-02-15 ENCOUNTER — Ambulatory Visit (HOSPITAL_BASED_OUTPATIENT_CLINIC_OR_DEPARTMENT_OTHER): Payer: Medicare Other | Admitting: Oncology

## 2013-02-15 ENCOUNTER — Encounter: Payer: Self-pay | Admitting: Oncology

## 2013-02-15 ENCOUNTER — Encounter: Payer: Medicare Other | Admitting: Nutrition

## 2013-02-15 ENCOUNTER — Other Ambulatory Visit (HOSPITAL_BASED_OUTPATIENT_CLINIC_OR_DEPARTMENT_OTHER): Payer: Medicare Other | Admitting: Lab

## 2013-02-15 VITALS — BP 149/70 | HR 82 | Temp 98.1°F | Resp 18 | Ht 62.0 in | Wt 97.7 lb

## 2013-02-15 DIAGNOSIS — C4499 Other specified malignant neoplasm of skin, unspecified: Secondary | ICD-10-CM

## 2013-02-15 DIAGNOSIS — M810 Age-related osteoporosis without current pathological fracture: Secondary | ICD-10-CM

## 2013-02-15 LAB — COMPREHENSIVE METABOLIC PANEL (CC13)
Albumin: 2.8 g/dL — ABNORMAL LOW (ref 3.5–5.0)
BUN: 16.8 mg/dL (ref 7.0–26.0)
CO2: 30 mEq/L — ABNORMAL HIGH (ref 22–29)
Calcium: 8.3 mg/dL — ABNORMAL LOW (ref 8.4–10.4)
Chloride: 102 mEq/L (ref 98–107)
Glucose: 111 mg/dl — ABNORMAL HIGH (ref 70–99)
Potassium: 3.8 mEq/L (ref 3.5–5.1)
Sodium: 137 mEq/L (ref 136–145)
Total Protein: 5.7 g/dL — ABNORMAL LOW (ref 6.4–8.3)

## 2013-02-15 LAB — CBC WITH DIFFERENTIAL/PLATELET
Basophils Absolute: 0.1 10*3/uL (ref 0.0–0.1)
Eosinophils Absolute: 0.3 10*3/uL (ref 0.0–0.5)
HGB: 10.6 g/dL — ABNORMAL LOW (ref 11.6–15.9)
MCV: 94.5 fL (ref 79.5–101.0)
MONO#: 0.4 10*3/uL (ref 0.1–0.9)
NEUT#: 3.7 10*3/uL (ref 1.5–6.5)
RDW: 17 % — ABNORMAL HIGH (ref 11.2–14.5)
WBC: 5.2 10*3/uL (ref 3.9–10.3)
lymph#: 0.7 10*3/uL — ABNORMAL LOW (ref 0.9–3.3)

## 2013-02-15 NOTE — Progress Notes (Signed)
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 Cycle 1 gemzar/ taxotere was given 2-19 and 01-11-13 with neulasta 01-12-13. She has been gradually improving since discharge from hospital 01-24-2013, with home PT and assistance from [facility] dietician. She can tell some improvement in the areas of porocarcinoma LUE with no new concerns there She is understandably very reluctant to consider further chemotherapy. She has PAC in, flushed 01-24-13.  Patient was diagnosed with porocarcinoma involving 3 areas left hand and arm in spring 2012 by Dr.Lupton. She had re-excisions of all areas (dorsum left hand, 2 on left forearm and left arm) by Dr Herbie Saxon. She had significant lymphedema LUE after those procedures, with history of left breast cancer with 20 node left axillary dissection in 1994 followed by radiation. Repeat scans at [facility] in 05-2012 reportedly had no other distant disease. Because of progressive in transit mets, she had hyperthermic limb infusion by Dr Lenis Noon at [facility] in 06-2012 (chemotherapeutic agent not in notes available to me); she also had excision of an area on dorsum of left hand in Oct 2013. She had significant swelling of LUE after the limb perfusion, which gradually improved.She had progression of the in transit mets LUE since the limb perfusion, which are not symptomatic. She was seen in consultation by Dr Orvilla Fus at [facility], with recommendation for gemzar/taxotere as systemic sarcoma regimen, chosen in part due to her prior chemotherapy regimens. [facility] notes report no evidence of disease on CXR there on 11-23-2012, and their last CT CAP 06-15-12 no  distant disease; she has not had more recent imaging in [facility] system.   She additionally had ovarian cancer IA clear cell in 2004, treated after surgery with 3 cycles of taxol/ carboplatin. She saw Dr Yolande Jolly in June 2013 and has year follow up with him.  Her left breast cancer was T3N1 (2 of 20 nodes) ER+ in 1994, treated with mastectomy and axillary nodes, adriamycin/cytoxan x4, RT, and tamoxifen x 5 years. Last mammograms were at [facility] 05-12-12; she would like to have mammograms done now at [facility]   Patient has been eating regular diet as well as one [employer] daily; I have encouraged her to try at least 2 [employer] daily. She can tell strength is improving with home PT, but still tires easily She denies increased SOB or LE swelling, no nausea/vomiting, no bleeding, no fever or symptoms of infection. Chronic back pain is stable. Swelling in LUE is stable or possibly a little better. She has had no problems from [facility]. Remainder of 10 point Review of Systems negative.  Objective:  Vital signs in last 24 hours:  BP 149/70  Pulse 82  Temp(Src) 98.1 F (36.7 C) (Oral)  Resp 18  Ht 5\' 2"  (1.575 m)  Wt 97 lb 11.2 oz (44.316 kg)  BMI 17.86 kg/m2 Weight is down 2 lbs from Feb. Ambulatory without assistance, looks better overall today tho still frail Alert, appropriate conversation, very pleasant as always.    HEENT:PERRLA, sclera clear, anicteric and oropharynx clear, no lesions. Wearing wig. LymphaticsCervical, supraclavicular, and axillary nodes normal. Resp: clear to auscultation bilaterally and normal percussion bilaterally Cardio: regular rate and rhythm GI: soft, non-tender; bowel sounds normal; no masses,  no organomegaly Extremities:  RUE and bilat LE no edema, cords, tenderness LUE with 2+ lymphedema forearm to just above elbow. The areas of porocarcinoma midforearm dorsally and above elbow all are less prominent and less fleshy/ more keratotic,  none tender and none open.No palpable nodes left axilla. No apparent infection LUE. Neuro: nonfocal Breast: left mastectomy scar without evidence of local recurrence Right breast without dominant mass or other findings of concern. Portacath-without erythema or tenderness.  Lab Results:  Results for orders placed in visit on 02/15/13  CBC WITH DIFFERENTIAL      Result Value Range   WBC 5.2  3.9 - 10.3 10e3/uL   NEUT# 3.7  1.5 - 6.5 10e3/uL   HGB 10.6 (*) 11.6 - 15.9 g/dL   HCT 16.1 (*) 09.6 - 04.5 %   Platelets 144 (*) 145 - 400 10e3/uL   MCV 94.5  79.5 - 101.0 fL   MCH 31.8  25.1 - 34.0 pg   MCHC 33.7  31.5 - 36.0 g/dL   RBC 4.09 (*) 8.11 - 9.14 10e6/uL   RDW 17.0 (*) 11.2 - 14.5 %   lymph# 0.7 (*) 0.9 - 3.3 10e3/uL   MONO# 0.4  0.1 - 0.9 10e3/uL   Eosinophils Absolute 0.3  0.0 - 0.5 10e3/uL   Basophils Absolute 0.1  0.0 - 0.1 10e3/uL   NEUT% 71.0  38.4 - 76.8 %   LYMPH% 13.6 (*) 14.0 - 49.7 %   MONO% 8.6  0.0 - 14.0 %   EOS% 5.6  0.0 - 7.0 %   BASO% 1.2  0.0 - 2.0 %  COMPREHENSIVE METABOLIC PANEL (CC13)      Result Value Range   Sodium 137  136 - 145 mEq/L   Potassium 3.8  3.5 - 5.1 mEq/L   Chloride 102  98 - 107 mEq/L   CO2 30 (*) 22 - 29 mEq/L   Glucose 111 (*) 70 - 99 mg/dl   BUN 78.2  7.0 - 95.6 mg/dL   Creatinine 0.8  0.6 - 1.1 mg/dL   Total Bilirubin 2.13 (*) 0.20 - 1.20 mg/dL   Alkaline Phosphatase 53  40 - 150 U/L   AST 13  5 - 34 U/L   ALT 8  0 - 55 U/L   Total Protein 5.7 (*) 6.4 - 8.3 g/dL   Albumin 2.8 (*) 3.5 - 5.0 g/dL   Calcium 8.3 (*) 8.4 - 10.4 mg/dL     Studies/Results:  No results found.  Medications: I have reviewed the patient's current medications.  Assessment/Plan: 1.In transit porocarcinoma of LUE: not known to have distant mets. Now post single cycle of dose reduced gemzar taxotere, which clearly has improved the skin lesions, however complications were so difficult for her that patient is not presently willing to consider further  treatment. We have decided to continue to follow closely with another visit when  [facility] is due flush in ~ 4 weeks, hopefully with patient continuing to improve PS during that time. I did tell her that if we treat again, certainly doses will be lowered further. 2.history of high grade IA ovarian carcinoma, not known active 3.history of node positive left breast cancer with 20 node left axillary dissection and local radiation , and increased lymphedema since porocarcinoma involving that arm . Breast cancer not known recurrent 4.PAC in due to the chemotherapy, but extremely useful during her acute hospitalization. 5.cardiomyopathy in 2006, normal EF by echo March 2012. LBBB 6.osteoporosis with previous vertebral compression fracture 7. Poor  nutritional status related to complicated medical course, with po's improving. 8.significant deconditioning   Patient is in agreement with plan above and knows that she can call at any time prior to return visit if needed. She prefers appointments on Mondays.  Shelbia Scinto P, MD   02/15/2013, 3:09 PM

## 2013-02-16 ENCOUNTER — Ambulatory Visit (INDEPENDENT_AMBULATORY_CARE_PROVIDER_SITE_OTHER): Payer: Medicare Other | Admitting: Internal Medicine

## 2013-02-16 ENCOUNTER — Encounter: Payer: Self-pay | Admitting: Internal Medicine

## 2013-02-16 VITALS — BP 130/72 | HR 78 | Temp 97.9°F | Resp 18 | Wt 99.0 lb

## 2013-02-16 DIAGNOSIS — I10 Essential (primary) hypertension: Secondary | ICD-10-CM

## 2013-02-16 DIAGNOSIS — I428 Other cardiomyopathies: Secondary | ICD-10-CM

## 2013-02-16 DIAGNOSIS — I2510 Atherosclerotic heart disease of native coronary artery without angina pectoris: Secondary | ICD-10-CM

## 2013-02-16 NOTE — Progress Notes (Signed)
Subjective:    Patient ID: Judy Herrera, female    DOB: 1931-05-20, 79 y.o.   MRN: [id number]  HPI  79 year old patient who is seen today for followup. She has a history of coronary artery disease cardiomyopathy and hypertension.  She was seen one month ago with considerable lower extremity edema which was multi-factorial. She was placed on a modest dose of furosemide and amlodipine discontinued. She had a history of significant hypoalbuminemia. Appetite is stable and her edema has resolved. She took furosemide for approximately 2 weeks and has been off his medication for approximately 2 weeks. Weight is down 10 pounds  Wt Readings from Last 3 Encounters:  02/16/13 99 lb (44.906 kg)  02/15/13 97 lb 11.2 oz (44.316 kg)  02/03/13 104 lb 14.4 oz (47.582 kg)   Past Medical History  Diagnosis Date  . Hypertension   . Allergy   . Cancer   . Ovarian ca   . Cataract   . Breast CA   . CAD (coronary artery disease)   . Hyperlipidemia   . Cardiomyopathy     improved  . Arthritis   . Osteoporosis     History   Social History  . Marital Status: Widowed    Spouse Name: N/A    Number of Children: N/A  . Years of Education: N/A   Occupational History  . Not on file.   Social History Main Topics  . Smoking status: Never Smoker   . Smokeless tobacco: Never Used  . Alcohol Use: No  . Drug Use: No  . Sexually Active: Not on file     Comment: didn't ask   Other Topics Concern  . Not on file   Social History Narrative  . No narrative on file    Past Surgical History  Procedure Laterality Date  . Left breast mastectomy  1994    T3 N1 Mx  . Shoulder arthroscopy    . Right leg    . Shoulder surgery      replacement  . Modified radical mastectomy w/ axillary lymph node dissection w/ pectoralis minor excision  1994    Left  . Eye surgery      CATARACTS  . Abdominal hysterectomy  2004  . Cholecystectomy  1993  . Breast surgery      Family History  Problem Relation Age  of Onset  . Breast cancer Sister   . Cancer Sister   . Heart disease Sister   . Heart disease Father   . Heart disease Mother     Allergies  Allergen Reactions  . Ibuprofen Nausea Only  . Morphine Nausea And Vomiting    Current Outpatient Prescriptions on File Prior to Visit  Medication Sig Dispense Refill  . acyclovir (ZOVIRAX) 400 MG tablet Take 400 mg by mouth 3 (three) times daily as needed (for fever blisters.).      Marland Kitchen aspirin EC 81 MG tablet Take 81 mg by mouth at bedtime.       . Calcium Carbonate-Vitamin D (CALCIUM + D PO) Take 600 mg by mouth daily after lunch.       . carvedilol (COREG) 25 MG tablet Take 1 tablet (25 mg total) by mouth 2 (two) times daily with a meal.  180 tablet  5  . Cholecalciferol (VITAMIN D) 2000 UNITS tablet Take 2,000 Units by mouth daily.      . Coenzyme Q10 (CO Q 10) 100 MG CAPS Take 1 capsule by mouth daily.      Marland Kitchen  denosumab (PROLIA) 60 MG/ML SOLN injection Inject 60 mg into the skin every 6 (six) months. Administer in upper arm, thigh, or abdomen      . ferrous gluconate (FERGON) 324 MG tablet Take 324 mg by mouth 2 (two) times a week. Takes 2 tabs on empty stomach with OJ on Monday and Friday.      . furosemide (LASIX) 20 MG tablet Take 1 tablet (20 mg total) by mouth daily.  30 tablet  3  . HYDROcodone-acetaminophen (NORCO/VICODIN) 5-325 MG per tablet Take 1 tablet by mouth every 6 (six) hours as needed for pain.      Marland Kitchen lisinopril (PRINIVIL,ZESTRIL) 20 MG tablet Take 20 mg by mouth at bedtime.       Marland Kitchen loperamide (IMODIUM) 2 MG capsule Take 1 capsule (2 mg total) by mouth as needed for diarrhea or loose stools.  20 capsule  0  . LORazepam (ATIVAN) 0.5 MG tablet Take 1 tablet under the tongue or swallow every 6 hours as needed for nausea.  20 tablet  0  . ondansetron (ZOFRAN) 8 MG tablet Take 8-16 mg by mouth every 12 (twelve) hours as needed for nausea.      . potassium chloride (K-DUR) 10 MEQ tablet Take one tablet daily for 7 days.  7 tablet  0   . simvastatin (ZOCOR) 40 MG tablet Take 1 tablet (40 mg total) by mouth at bedtime.  90 tablet  4  . traMADol (ULTRAM) 50 MG tablet Take 1 tablet (50 mg total) by mouth every 6 (six) hours as needed for pain.  60 tablet  4   No current facility-administered medications on file prior to visit.    BP 130/72  Pulse 78  Temp(Src) 97.9 F (36.6 C) (Oral)  Resp 18  Wt 99 lb (44.906 kg)  BMI 18.1 kg/m2  SpO2 97%      Review of Systems  Constitutional: Negative.   HENT: Negative for hearing loss, congestion, sore throat, rhinorrhea, dental problem, sinus pressure and tinnitus.   Eyes: Negative for pain, discharge and visual disturbance.  Respiratory: Negative for cough and shortness of breath.   Cardiovascular: Positive for leg swelling. Negative for chest pain and palpitations.  Gastrointestinal: Negative for nausea, vomiting, abdominal pain, diarrhea, constipation, blood in stool and abdominal distention.  Genitourinary: Negative for dysuria, urgency, frequency, hematuria, flank pain, vaginal bleeding, vaginal discharge, difficulty urinating, vaginal pain and pelvic pain.  Musculoskeletal: Negative for joint swelling, arthralgias and gait problem.  Skin: Negative for rash.  Neurological: Negative for dizziness, syncope, speech difficulty, weakness, numbness and headaches.  Hematological: Negative for adenopathy.  Psychiatric/Behavioral: Negative for behavioral problems, dysphoric mood and agitation. The patient is not nervous/anxious.        Objective:   Physical Exam  Constitutional: She is oriented to person, place, and time. She appears well-developed and well-nourished.  HENT:  Head: Normocephalic.  Right Ear: External ear normal.  Left Ear: External ear normal.  Mouth/Throat: Oropharynx is clear and moist.  Eyes: Conjunctivae and EOM are normal. Pupils are equal, round, and reactive to light.  Neck: Normal range of motion. Neck supple. No thyromegaly present.   Cardiovascular: Normal rate, regular rhythm, normal heart sounds and intact distal pulses.   Pulmonary/Chest: Effort normal and breath sounds normal.  Abdominal: Soft. Bowel sounds are normal. She exhibits no mass. There is no tenderness.  Musculoskeletal: Normal range of motion. She exhibits no edema.  Lymphadenopathy:    She has no cervical adenopathy.  Neurological: She is  alert and oriented to person, place, and time.  Skin: Skin is warm and dry. No rash noted.  Psychiatric: She has a normal mood and affect. Her behavior is normal.          Assessment & Plan:    Lower extremity edema. Resolved we'll continue present regimen and uses furosemide when necessary only. We'll discontinue potassium supplement Hypertension well controlled blood pressure 120/70 Coronary artery disease stable  Recheck 6 months

## 2013-02-16 NOTE — Patient Instructions (Signed)
Limit your sodium (Salt) intake  Okay to discontinue potassium supplementation  Use  furosemide for control of swelling as needed  Return in 6 months for follow-up

## 2013-02-17 ENCOUNTER — Encounter: Payer: Self-pay | Admitting: Nutrition

## 2013-02-17 NOTE — Progress Notes (Signed)
Patient did not show up for scheduled nutrition appointment on 02/15/2013.

## 2013-03-14 ENCOUNTER — Other Ambulatory Visit: Payer: Self-pay | Admitting: Oncology

## 2013-03-17 ENCOUNTER — Ambulatory Visit: Payer: Medicare Other | Admitting: Oncology

## 2013-03-17 ENCOUNTER — Ambulatory Visit (HOSPITAL_BASED_OUTPATIENT_CLINIC_OR_DEPARTMENT_OTHER): Payer: Medicare Other | Admitting: Oncology

## 2013-03-17 ENCOUNTER — Other Ambulatory Visit: Payer: Medicare Other | Admitting: Lab

## 2013-03-17 ENCOUNTER — Encounter: Payer: Self-pay | Admitting: Oncology

## 2013-03-17 ENCOUNTER — Other Ambulatory Visit (HOSPITAL_BASED_OUTPATIENT_CLINIC_OR_DEPARTMENT_OTHER): Payer: Medicare Other | Admitting: Lab

## 2013-03-17 ENCOUNTER — Telehealth: Payer: Self-pay | Admitting: Oncology

## 2013-03-17 VITALS — BP 173/80 | HR 79 | Temp 97.6°F | Resp 18 | Ht 62.0 in | Wt 100.7 lb

## 2013-03-17 DIAGNOSIS — M810 Age-related osteoporosis without current pathological fracture: Secondary | ICD-10-CM

## 2013-03-17 DIAGNOSIS — C4499 Other specified malignant neoplasm of skin, unspecified: Secondary | ICD-10-CM

## 2013-03-17 DIAGNOSIS — D649 Anemia, unspecified: Secondary | ICD-10-CM

## 2013-03-17 LAB — CBC WITH DIFFERENTIAL/PLATELET
Basophils Absolute: 0 10*3/uL (ref 0.0–0.1)
EOS%: 3.9 % (ref 0.0–7.0)
Eosinophils Absolute: 0.2 10*3/uL (ref 0.0–0.5)
HGB: 10.9 g/dL — ABNORMAL LOW (ref 11.6–15.9)
NEUT#: 2.8 10*3/uL (ref 1.5–6.5)
RBC: 3.34 10*6/uL — ABNORMAL LOW (ref 3.70–5.45)
RDW: 15.1 % — ABNORMAL HIGH (ref 11.2–14.5)
lymph#: 0.6 10*3/uL — ABNORMAL LOW (ref 0.9–3.3)

## 2013-03-17 LAB — COMPREHENSIVE METABOLIC PANEL (CC13)
AST: 14 U/L (ref 5–34)
Albumin: 3.3 g/dL — ABNORMAL LOW (ref 3.5–5.0)
Alkaline Phosphatase: 51 U/L (ref 40–150)
BUN: 14 mg/dL (ref 7.0–26.0)
Calcium: 9.2 mg/dL (ref 8.4–10.4)
Chloride: 100 mEq/L (ref 98–107)
Potassium: 4.3 mEq/L (ref 3.5–5.1)
Sodium: 138 mEq/L (ref 136–145)
Total Protein: 6.1 g/dL — ABNORMAL LOW (ref 6.4–8.3)

## 2013-03-17 MED ORDER — SODIUM CHLORIDE 0.9 % IJ SOLN
10.0000 mL | INTRAMUSCULAR | Status: DC | PRN
  Administered 2013-03-17: 10 mL via INTRAVENOUS
  Filled 2013-03-17: qty 10

## 2013-03-17 MED ORDER — HEPARIN SOD (PORK) LOCK FLUSH 100 UNIT/ML IV SOLN
500.0000 [IU] | Freq: Once | INTRAVENOUS | Status: AC
  Administered 2013-03-17: 500 [IU] via INTRAVENOUS
  Filled 2013-03-17: qty 5

## 2013-03-17 NOTE — Patient Instructions (Addendum)
Call before next scheduled appointment if needed.

## 2013-03-17 NOTE — Progress Notes (Signed)
OFFICE PROGRESS NOTE   03/17/2013   Physicians: P.Kwaitkowski, P.Drusilla Kanner, F.Lupton, B.Crenshaw, M.Martin,  GI   INTERVAL HISTORY:   Patient is seen, alone for visit, in continuing attention to porocarcinoma LUE with in transit mets, continuing observation since she had an extremely difficult time after single cycle of gemzar/ taxotere in February 2014.  She has PAC in, flushed today.  Patient was diagnosed with porocarcinoma involving 3 areas left hand and arm in spring 2012 by Dr.Lupton. She had re-excisions of all areas (dorsum left hand, 2 on left forearm and left arm) by Dr Herbie Saxon. She had significant lymphedema LUE after those procedures, with history of left breast cancer with 20 node left axillary dissection in 1994 followed by radiation. Repeat scans at [facility] in 05-2012 reportedly had no other distant disease. Because of progressive in transit mets, she had hyperthermic limb infusion by Dr Lenis Noon at [facility] in 06-2012 (chemotherapeutic agent not in notes available to me); she also had excision of an area on dorsum of left hand in Oct 2013. She had significant swelling of LUE after the limb perfusion, which gradually improved.She had progression of the in transit mets LUE since the limb perfusion, which are not symptomatic. She was seen in consultation by Dr Orvilla Fus at [facility], with recommendation for gemzar/taxotere as systemic sarcoma regimen, chosen in part due to her prior chemotherapy regimens. [facility] notes report no evidence of disease on CXR there on 11-23-2012, and their last CT CAP 06-15-12 no distant disease; she has not had more recent imaging in [facility] system.   She additionally had ovarian cancer IA clear cell in 2004, treated after surgery with 3 cycles of taxol/ carboplatin. She saw Dr Yolande Jolly in June 2013 and has year follow up with him.  Her left breast cancer was T3N1 (2 of 20 nodes) ER+ in 1994, treated with mastectomy and axillary  nodes, adriamycin/cytoxan x4, RT, and tamoxifen x 5 years. Last mammograms were at [facility] 05-12-12; she would like to have mammograms done now at [facility]   Patient has felt gradually stronger and better since she was here last a month ago. Home PT has completed and her exercise tolerance is improving, tho she still has rest frequently with regular home activities. Appetite is improved, still using some [employer] which I have encouraged her to continue. No fever or symptoms of infection. No new or different pain. No cough. Less swelling LUE. Areas of intransit porocarcinoma are not bothersome, tho a little tender with pressure. No bleeding.  Remainder of 10 point Review of Systems negative.  Objective:  Vital signs in last 24 hours:  BP 173/80  Pulse 79  Temp(Src) 97.6 F (36.4 C) (Oral)  Resp 18  Ht 5\' 2"  (1.575 m)  Wt 100 lb 11.2 oz (45.677 kg)  BMI 18.41 kg/m2  Weight is up 3 lbs from 02-15-13. Still looks quite frail, but more easily mobile and overall more comfortable. Still wearing wig.  HEENT:PERRLA, sclera clear, anicteric and oropharynx clear, no lesions LymphaticsCervical, supraclavicular, and axillary nodes normal. Resp: clear to auscultation bilaterally and normal percussion bilaterally Cardio: regular rate and rhythm GI: scaphoid, soft, normal bowel sounds, no appreciable HSM or mass Extremities: extremities normal, atraumatic, no cyanosis or edema RUE and bilateral LE. LUE with lymphedema still apparent above and below elbow dependently, but no swelling in distal forearm or hand.  Skin: areas of in transit porocarcinoma scattered left forearm to mid upper arm, largest just above olecranon ~ 6-7 mm  diameter and others from ~ 1-4 mm. These seem slightly more prominent today, which may be with decrease in the UE swelling. Neuro: no new focal deficits Breast: left mastectomy scar without evidence of local recurrence, almost no SQ tissue across that area, large benign  seborrheic lesion centrally. Nothing palpable left axilla. Right breast without dominant mass or other findings of concern and axilla benign.   Lab Results:  Results for orders placed in visit on 03/17/13  CBC WITH DIFFERENTIAL      Result Value Range   WBC 4.0  3.9 - 10.3 10e3/uL   NEUT# 2.8  1.5 - 6.5 10e3/uL   HGB 10.9 (*) 11.6 - 15.9 g/dL   HCT 47.8 (*) 29.5 - 62.1 %   Platelets 136 (*) 145 - 400 10e3/uL   MCV 95.4  79.5 - 101.0 fL   MCH 32.8  25.1 - 34.0 pg   MCHC 34.4  31.5 - 36.0 g/dL   RBC 3.08 (*) 6.57 - 8.46 10e6/uL   RDW 15.1 (*) 11.2 - 14.5 %   lymph# 0.6 (*) 0.9 - 3.3 10e3/uL   MONO# 0.4  0.1 - 0.9 10e3/uL   Eosinophils Absolute 0.2  0.0 - 0.5 10e3/uL   Basophils Absolute 0.0  0.0 - 0.1 10e3/uL   NEUT% 69.4  38.4 - 76.8 %   LYMPH% 15.0  14.0 - 49.7 %   MONO% 10.5  0.0 - 14.0 %   EOS% 3.9  0.0 - 7.0 %   BASO% 1.2  0.0 - 2.0 %  COMPREHENSIVE METABOLIC PANEL (CC13)      Result Value Range   Sodium 138  136 - 145 mEq/L   Potassium 4.3  3.5 - 5.1 mEq/L   Chloride 100  98 - 107 mEq/L   CO2 31 (*) 22 - 29 mEq/L   Glucose 86  70 - 99 mg/dl   BUN 96.2  7.0 - 95.2 mg/dL   Creatinine 0.9  0.6 - 1.1 mg/dL   Total Bilirubin 8.41  0.20 - 1.20 mg/dL   Alkaline Phosphatase 51  40 - 150 U/L   AST 14  5 - 34 U/L   ALT 9  0 - 55 U/L   Total Protein 6.1 (*) 6.4 - 8.3 g/dL   Albumin 3.3 (*) 3.5 - 5.0 g/dL   Calcium 9.2  8.4 - 32.4 mg/dL     Studies/Results:  No results found. Last CTs CAP were at [facility] July 2013; we have discussed and she is in agreement with repeating CTs prior to my next visit in June. Last right mammogram was June 2013  Medications: I have reviewed the patient's current medications.   We have discussed fact that the porocarcinoma lesions did improve after the single cycle of gemzar/taxotere in Feb 2014, but even those reduced doses were not tolerated, including severe diarrhea and cytopenias (ANC <0.5, plt <10k) requiring hospitalization, only just  now really feeling better again. She is understandably not interested in further chemo now, tho is aware that we could use same agents later with further dose reductions if necessary for significant progression of the porocarcinoma. Will get restaging CTs in June as above, and for now let her continue to improve diet and PS.  Assessment/Plan: 1.In transit porocarcinoma of LUE: not known to have distant mets: progression after limb perfusion at [facility] early fall 2013, then single cycle of dose reduced gemzar taxotere which she tolerated extremely poorly. Continuing observation now as areas are asymptomatic. Follow up scans June.  2.history of high grade IA ovarian carcinoma, not known active, for visit back to Dr Yolande Jolly also in June 3.history of node positive left breast cancer with 20 node left axillary dissection and local radiation , and increased lymphedema since porocarcinoma involving that arm . Breast cancer not known recurrent  4.PAC in due to the chemotherapy, very difficult peripheral access, so keeping for now including flushes every 6-8 weeks. 5.cardiomyopathy in 2006, normal EF by echo March 2012. LBBB  6.osteoporosis with previous vertebral compression fracture  7. Poor nutritional status related to complicated medical course, with po's improving, and significant deconditioning   Patient understood discussion and is in agreement with plan Time spent 25 min.  LIVESAY,LENNIS P, MD   03/17/2013, 11:09 AM

## 2013-03-17 NOTE — Telephone Encounter (Signed)
gv pt appt schedule for June and took ins papers to [place] in managed care to complete for pt. Pt aware papers taken to managed care and that central would contact her re ct appt.

## 2013-03-21 ENCOUNTER — Encounter: Payer: Self-pay | Admitting: Oncology

## 2013-03-21 NOTE — Progress Notes (Signed)
Put cancer form on nurse's desk. °

## 2013-03-22 ENCOUNTER — Encounter: Payer: Self-pay | Admitting: Oncology

## 2013-03-22 NOTE — Progress Notes (Signed)
Mailed cancer form to patient's home.

## 2013-03-25 ENCOUNTER — Emergency Department
Admission: EM | Admit: 2013-03-25 | Discharge: 2013-03-25 | Disposition: A | Discharge status: Home / Self Care | Payer: Medicare Other | Source: Home / Self Care | Attending: Family Medicine | Admitting: Family Medicine

## 2013-03-25 ENCOUNTER — Encounter: Payer: Self-pay | Admitting: Emergency Medicine

## 2013-03-25 DIAGNOSIS — R300 Dysuria: Secondary | ICD-10-CM

## 2013-03-25 LAB — POCT URINALYSIS DIP (MANUAL ENTRY)
Bilirubin, UA: NEGATIVE
Glucose, UA: NEGATIVE
Ketones, POC UA: NEGATIVE
Spec Grav, UA: 1.02 (ref 1.005–1.03)

## 2013-03-25 MED ORDER — CEPHALEXIN 500 MG PO CAPS: 500.0000 mg | ORAL_CAPSULE | Freq: Three times a day (TID) | ORAL | Status: AC

## 2013-03-25 NOTE — ED Provider Notes (Signed)
History     CSN: [id number]  Arrival date & time 03/25/13  0920   First MD Initiated Contact with Patient 03/25/13 0920      Chief Complaint  Patient presents with  . Urinary Frequency  . Dysuria   HPI DYSURIA Onset:  2-3 days  Description: increased urinary frequency, dysuria Modifying factors: s/p chemo for skin ca 2-3 months ago. No active treatment. No fevers, chills, flank pain.   Symptoms Urgency:  yes Frequency: yes  Hesitancy:  no Hematuria:  no Flank Pain:  no Fever: no Nausea/Vomiting:  no Missed LMP: no STD exposure: no Discharge: no Irritants: no Rash: no  Red Flags   More than 3 UTI's last 12 months:  no PMH of  Diabetes or Immunosuppression:  Resolved immunosuppression Renal Disease/Calculi: no Urinary Tract Abnormality:  no Instrumentation or Trauma: no    Past Medical History  Diagnosis Date  . Hypertension   . Allergy   . Cancer   . Ovarian ca   . Cataract   . Breast CA   . CAD (coronary artery disease)   . Hyperlipidemia   . Cardiomyopathy     improved  . Arthritis   . Osteoporosis     Past Surgical History  Procedure Laterality Date  . Left breast mastectomy  1994    T3 N1 Mx  . Shoulder arthroscopy    . Right leg    . Shoulder surgery      replacement  . Modified radical mastectomy w/ axillary lymph node dissection w/ pectoralis minor excision  1994    Left  . Eye surgery      CATARACTS  . Abdominal hysterectomy  2004  . Cholecystectomy  1993  . Breast surgery      Family History  Problem Relation Age of Onset  . Breast cancer Sister   . Cancer Sister   . Heart disease Sister   . Heart disease Father   . Heart disease Mother     History  Substance Use Topics  . Smoking status: Never Smoker   . Smokeless tobacco: Never Used  . Alcohol Use: No    OB History   Grav Para Term Preterm Abortions TAB SAB Ect Mult Living                  Review of Systems  All other systems reviewed and are  negative.    Allergies  Ibuprofen and Morphine  Home Medications   Current Outpatient Rx  Name  Route  Sig  Dispense  Refill  . acyclovir (ZOVIRAX) 400 MG tablet   Oral   Take 400 mg by mouth 3 (three) times daily as needed (for fever blisters.).         Marland Kitchen aspirin EC 81 MG tablet   Oral   Take 81 mg by mouth at bedtime.          . Calcium Carbonate-Vitamin D (CALCIUM + D PO)   Oral   Take 600 mg by mouth daily after lunch.          . carvedilol (COREG) 25 MG tablet   Oral   Take 1 tablet (25 mg total) by mouth 2 (two) times daily with a meal.   180 tablet   5     Needs to be seen for future refills - last seen 1/ ...   . Cholecalciferol (VITAMIN D) 2000 UNITS tablet   Oral   Take 2,000 Units by mouth daily.         Marland Kitchen  Coenzyme Q10 (CO Q 10) 100 MG CAPS   Oral   Take 1 capsule by mouth daily.         Marland Kitchen denosumab (PROLIA) 60 MG/ML SOLN injection   Subcutaneous   Inject 60 mg into the skin every 6 (six) months. Administer in upper arm, thigh, or abdomen         . ferrous gluconate (FERGON) 324 MG tablet   Oral   Take 324 mg by mouth 2 (two) times a week. Takes 2 tabs on empty stomach with OJ on Monday and Friday.         Marland Kitchen HYDROcodone-acetaminophen (NORCO/VICODIN) 5-325 MG per tablet   Oral   Take 1 tablet by mouth every 6 (six) hours as needed for pain.         Marland Kitchen lisinopril (PRINIVIL,ZESTRIL) 20 MG tablet   Oral   Take 20 mg by mouth at bedtime.          Marland Kitchen loperamide (IMODIUM) 2 MG capsule   Oral   Take 1 capsule (2 mg total) by mouth as needed for diarrhea or loose stools.   20 capsule   0   . LORazepam (ATIVAN) 0.5 MG tablet      Take 1 tablet under the tongue or swallow every 6 hours as needed for nausea.   20 tablet   0   . ondansetron (ZOFRAN) 8 MG tablet   Oral   Take 8-16 mg by mouth every 12 (twelve) hours as needed for nausea.         . potassium chloride (K-DUR) 10 MEQ tablet      Take one tablet daily for 7 days.    7 tablet   0   . simvastatin (ZOCOR) 40 MG tablet   Oral   Take 1 tablet (40 mg total) by mouth at bedtime.   90 tablet   4     Must be seen for refills- last seen 11/2010   . traMADol (ULTRAM) 50 MG tablet   Oral   Take 1 tablet (50 mg total) by mouth every 6 (six) hours as needed for pain.   60 tablet   4     Must be seen for future refills- last seen 11/2010     BP 159/73  Pulse 78  Temp(Src) 98 F (36.7 C) (Oral)  Resp 16  Ht 5\' 2"  (1.575 m)  Wt 100 lb (45.36 kg)  BMI 18.29 kg/m2  SpO2 99%  Physical Exam  Constitutional: She appears well-developed and well-nourished.  HENT:  Head: Normocephalic and atraumatic.  Eyes: Conjunctivae are normal. Pupils are equal, round, and reactive to light.  Neck: Normal range of motion.  Cardiovascular: Normal rate, regular rhythm and normal heart sounds.   Pulmonary/Chest: Effort normal.  Abdominal: Soft. Bowel sounds are normal.  No flank pain  Mild suprapubic tenderness   Musculoskeletal: Normal range of motion.  Neurological: She is alert.  Skin: Skin is warm.    ED Course  Procedures (including critical care time)  Labs Reviewed  URINE CULTURE  POCT URINALYSIS DIP (MANUAL ENTRY)   No results found.   1. Dysuria       MDM  Will treat with keflex.  Urine culture.  No red flags on exam or in history.  Discussed infectious and GU red flags at length.  Follow up with PCP in 1-2 weeks.  Follow up as needed.     The patient and/or caregiver has been counseled thoroughly with regard to  treatment plan and/or medications prescribed including dosage, schedule, interactions, rationale for use, and possible side effects and they verbalize understanding. Diagnoses and expected course of recovery discussed and will return if not improved as expected or if the condition worsens. Patient and/or caregiver verbalized understanding.             Doree Albee, MD 03/25/13 [phone]

## 2013-03-25 NOTE — ED Notes (Signed)
Reports 3 day hx of frequency of urination; today began dysuria.

## 2013-03-26 LAB — URINE CULTURE

## 2013-03-27 ENCOUNTER — Telehealth: Payer: Self-pay | Admitting: *Deleted

## 2013-04-05 ENCOUNTER — Ambulatory Visit (INDEPENDENT_AMBULATORY_CARE_PROVIDER_SITE_OTHER): Payer: Medicare Other | Admitting: Cardiology

## 2013-04-05 ENCOUNTER — Encounter: Payer: Self-pay | Admitting: Cardiology

## 2013-04-05 VITALS — BP 150/70 | HR 70 | Wt 103.0 lb

## 2013-04-05 DIAGNOSIS — E785 Hyperlipidemia, unspecified: Secondary | ICD-10-CM

## 2013-04-05 DIAGNOSIS — I2510 Atherosclerotic heart disease of native coronary artery without angina pectoris: Secondary | ICD-10-CM

## 2013-04-05 DIAGNOSIS — I429 Cardiomyopathy, unspecified: Secondary | ICD-10-CM

## 2013-04-05 DIAGNOSIS — I428 Other cardiomyopathies: Secondary | ICD-10-CM

## 2013-04-05 DIAGNOSIS — I10 Essential (primary) hypertension: Secondary | ICD-10-CM

## 2013-04-05 NOTE — Assessment & Plan Note (Signed)
Continue statin. 

## 2013-04-05 NOTE — Progress Notes (Signed)
HPI: Judy Herrera is a very pleasant female with a history of cardiomyopathy out of proportion to coronary artery disease. She did have a cardiac catheterization performed on August 18, 2005. At that time, she had a 60% OM, but otherwise nonobstructive coronary artery disease. Ejection fraction was 45%. The last echocardiogram in January 2010 showed normal LV function, mild aortic insufficiency and mitral regurgitation. I last saw her in June of 2013. Patient recently treated with chemo for skin cancer; hospitalized 3/14 with dehydration. Since then, the patient has dyspnea with more extreme activities but not with routine activities. It is relieved with rest. It is not associated with chest pain. There is no orthopnea, PND or pedal edema. There is no syncope or palpitations. There is no exertional chest pain.   Current Outpatient Prescriptions  Medication Sig Dispense Refill  . acyclovir (ZOVIRAX) 400 MG tablet Take 400 mg by mouth 3 (three) times daily as needed (for fever blisters.).      Marland Kitchen aspirin EC 81 MG tablet Take 81 mg by mouth at bedtime.       . Calcium Carbonate-Vitamin D (CALCIUM + D PO) Take 600 mg by mouth daily after lunch.       . carvedilol (COREG) 25 MG tablet Take 1 tablet (25 mg total) by mouth 2 (two) times daily with a meal.  180 tablet  5  . Cholecalciferol (VITAMIN D) 2000 UNITS tablet Take 2,000 Units by mouth daily.      . Coenzyme Q10 (CO Q 10) 100 MG CAPS Take 1 capsule by mouth daily.      Marland Kitchen denosumab (PROLIA) 60 MG/ML SOLN injection Inject 60 mg into the skin every 6 (six) months. Administer in upper arm, thigh, or abdomen      . ferrous gluconate (FERGON) 324 MG tablet Take 324 mg by mouth 2 (two) times a week. Takes 2 tabs on empty stomach with OJ on Monday and Friday.      Marland Kitchen HYDROcodone-acetaminophen (NORCO/VICODIN) 5-325 MG per tablet Take 1 tablet by mouth every 6 (six) hours as needed for pain.      Marland Kitchen lisinopril (PRINIVIL,ZESTRIL) 20 MG tablet Take 20 mg by mouth  at bedtime.       Marland Kitchen loperamide (IMODIUM) 2 MG capsule Take 1 capsule (2 mg total) by mouth as needed for diarrhea or loose stools.  20 capsule  0  . LORazepam (ATIVAN) 0.5 MG tablet Take 1 tablet under the tongue or swallow every 6 hours as needed for nausea.  20 tablet  0  . simvastatin (ZOCOR) 40 MG tablet Take 1 tablet (40 mg total) by mouth at bedtime.  90 tablet  4  . traMADol (ULTRAM) 50 MG tablet Take 1 tablet (50 mg total) by mouth every 6 (six) hours as needed for pain.  60 tablet  4   No current facility-administered medications for this visit.     Past Medical History  Diagnosis Date  . Hypertension   . Allergy   . Cancer   . Ovarian ca   . Cataract   . Breast CA   . CAD (coronary artery disease)   . Hyperlipidemia   . Cardiomyopathy     improved  . Arthritis   . Osteoporosis     Past Surgical History  Procedure Laterality Date  . Left breast mastectomy  1994    T3 N1 Mx  . Shoulder arthroscopy    . Right leg    . Shoulder surgery  replacement  . Modified radical mastectomy w/ axillary lymph node dissection w/ pectoralis minor excision  1994    Left  . Eye surgery      CATARACTS  . Abdominal hysterectomy  2004  . Cholecystectomy  1993  . Breast surgery      History   Social History  . Marital Status: Widowed    Spouse Name: N/A    Number of Children: N/A  . Years of Education: N/A   Occupational History  . Not on file.   Social History Main Topics  . Smoking status: Never Smoker   . Smokeless tobacco: Never Used  . Alcohol Use: No  . Drug Use: No  . Sexually Active: Not on file     Comment: didn't ask   Other Topics Concern  . Not on file   Social History Narrative  . No narrative on file    ROS: no fevers or chills, productive cough, hemoptysis, dysphasia, odynophagia, melena, hematochezia, dysuria, hematuria, rash, seizure activity, orthopnea, PND, pedal edema, claudication. Remaining systems are negative.  Physical  Exam: Well-developed well-nourished in no acute distress.  Skin is warm and dry.  HEENT is normal.  Neck is supple.  Chest is clear to auscultation with normal expansion.  Cardiovascular exam is regular rate and rhythm. 2/6 systolic murmur Abdominal exam nontender or distended. No masses palpated. Extremities show no edema. LUE with small nodules. neuro grossly intact  ECG 01/27/13 - sinus with LBBB

## 2013-04-05 NOTE — Patient Instructions (Addendum)

## 2013-04-05 NOTE — Assessment & Plan Note (Signed)
Continue present medications. 

## 2013-04-05 NOTE — Assessment & Plan Note (Signed)
LV function improved on most recent echo. Will repeat. Continue ACE inhibitor and beta blocker.

## 2013-04-05 NOTE — Assessment & Plan Note (Signed)
Continue aspirin and statin. 

## 2013-04-13 ENCOUNTER — Ambulatory Visit (HOSPITAL_COMMUNITY): Payer: Medicare Other | Attending: Cardiology

## 2013-04-13 DIAGNOSIS — I429 Cardiomyopathy, unspecified: Secondary | ICD-10-CM

## 2013-04-13 DIAGNOSIS — Z9010 Acquired absence of unspecified breast and nipple: Secondary | ICD-10-CM | POA: Insufficient documentation

## 2013-04-13 DIAGNOSIS — C50919 Malignant neoplasm of unspecified site of unspecified female breast: Secondary | ICD-10-CM | POA: Insufficient documentation

## 2013-04-13 DIAGNOSIS — I428 Other cardiomyopathies: Secondary | ICD-10-CM | POA: Insufficient documentation

## 2013-04-13 DIAGNOSIS — Z85828 Personal history of other malignant neoplasm of skin: Secondary | ICD-10-CM | POA: Insufficient documentation

## 2013-04-13 DIAGNOSIS — Z9221 Personal history of antineoplastic chemotherapy: Secondary | ICD-10-CM | POA: Insufficient documentation

## 2013-04-13 DIAGNOSIS — I079 Rheumatic tricuspid valve disease, unspecified: Secondary | ICD-10-CM | POA: Insufficient documentation

## 2013-04-13 DIAGNOSIS — I080 Rheumatic disorders of both mitral and aortic valves: Secondary | ICD-10-CM | POA: Insufficient documentation

## 2013-04-13 DIAGNOSIS — E785 Hyperlipidemia, unspecified: Secondary | ICD-10-CM | POA: Insufficient documentation

## 2013-04-13 DIAGNOSIS — I10 Essential (primary) hypertension: Secondary | ICD-10-CM | POA: Insufficient documentation

## 2013-04-13 DIAGNOSIS — I2510 Atherosclerotic heart disease of native coronary artery without angina pectoris: Secondary | ICD-10-CM | POA: Insufficient documentation

## 2013-04-13 NOTE — Progress Notes (Signed)
Echocardiogram performed.  

## 2013-04-19 ENCOUNTER — Ambulatory Visit: Payer: Medicare Other | Admitting: Cardiology

## 2013-04-22 ENCOUNTER — Encounter: Payer: Self-pay | Admitting: Emergency Medicine

## 2013-04-22 ENCOUNTER — Emergency Department (INDEPENDENT_AMBULATORY_CARE_PROVIDER_SITE_OTHER)
Admission: EM | Admit: 2013-04-22 | Discharge: 2013-04-22 | Disposition: A | Discharge status: Home / Self Care | Payer: Medicare Other | Source: Home / Self Care | Attending: Emergency Medicine | Admitting: Emergency Medicine

## 2013-04-22 DIAGNOSIS — R81 Glycosuria: Secondary | ICD-10-CM

## 2013-04-22 DIAGNOSIS — N390 Urinary tract infection, site not specified: Secondary | ICD-10-CM

## 2013-04-22 LAB — POCT FASTING CBG KUC MANUAL ENTRY: POCT Glucose (KUC): 113 mg/dL — AB (ref 70–99)

## 2013-04-22 LAB — POCT URINALYSIS DIP (MANUAL ENTRY)
Bilirubin, UA: NEGATIVE
Blood, UA: NEGATIVE
Glucose, UA: 100
Ketones, POC UA: NEGATIVE
Leukocytes, UA: NEGATIVE
Nitrite, UA: POSITIVE
Protein Ur, POC: NEGATIVE
Spec Grav, UA: 1.015
Urobilinogen, UA: 1
pH, UA: 7.5

## 2013-04-22 MED ORDER — CEPHALEXIN 500 MG PO CAPS: 500.0000 mg | ORAL_CAPSULE | Freq: Three times a day (TID) | ORAL | Status: DC

## 2013-04-22 NOTE — ED Provider Notes (Signed)
History     CSN: [id number]  Arrival date & time 04/22/13  0901   First MD Initiated Contact with Patient 04/22/13 [phone]      Chief Complaint  Patient presents with  . Dysuria    The history is provided by the patient.  This is a 79 y.o. female who presents today with UTI symptoms for 1 day.  + dysuria + frequency + urgency No hematuria No vaginal discharge No fever/chills Mild suprapubic lower abdominal pain No nausea No vomiting Minimal back pain, similar to her chronic low back pain No fatigue  Has tried over-the-counter measures, Azo, without improvement. Was seen here 03/25/2013 for uncomplicated cystitis, treated effectively with Keflex. Prior to that, last UTI was several years ago.    Past Medical History  Diagnosis Date  . Hypertension   . Allergy   . Cancer   . Ovarian ca   . Cataract   . Breast CA   . CAD (coronary artery disease)   . Hyperlipidemia   . Cardiomyopathy     improved  . Arthritis   . Osteoporosis     Past Surgical History  Procedure Laterality Date  . Left breast mastectomy  1994    T3 N1 Mx  . Shoulder arthroscopy    . Right leg    . Shoulder surgery      replacement  . Modified radical mastectomy w/ axillary lymph node dissection w/ pectoralis minor excision  1994    Left  . Eye surgery      CATARACTS  . Abdominal hysterectomy  2004  . Cholecystectomy  1993  . Breast surgery      Family History  Problem Relation Age of Onset  . Breast cancer Sister   . Cancer Sister   . Heart disease Sister   . Heart disease Father   . Heart disease Mother     History  Substance Use Topics  . Smoking status: Never Smoker   . Smokeless tobacco: Never Used  . Alcohol Use: No    OB History   Grav Para Term Preterm Abortions TAB SAB Ect Mult Living                  Review of Systems  Constitutional: Negative for fever, chills and diaphoresis.  HENT: Negative.   Eyes: Negative.   Respiratory: Negative.   Cardiovascular:  Negative.   Gastrointestinal: Negative for nausea, vomiting, blood in stool and abdominal distention.  Genitourinary: Negative for hematuria, flank pain, vaginal bleeding and pelvic pain.  Neurological: Negative for light-headedness.  Hematological: Negative.   Psychiatric/Behavioral: Negative.  Negative for hallucinations and decreased concentration.    Allergies  Ibuprofen and Morphine  Home Medications   Current Outpatient Rx  Name  Route  Sig  Dispense  Refill  . acyclovir (ZOVIRAX) 400 MG tablet   Oral   Take 400 mg by mouth 3 (three) times daily as needed (for fever blisters.).         Marland Kitchen aspirin EC 81 MG tablet   Oral   Take 81 mg by mouth at bedtime.          . Calcium Carbonate-Vitamin D (CALCIUM + D PO)   Oral   Take 600 mg by mouth daily after lunch.          . carvedilol (COREG) 25 MG tablet   Oral   Take 1 tablet (25 mg total) by mouth 2 (two) times daily with a meal.   180 tablet  5     Needs to be seen for future refills - last seen 1/ ...   . cephALEXin (KEFLEX) 500 MG capsule   Oral   Take 1 capsule (500 mg total) by mouth 3 (three) times daily.   30 capsule   0   . Cholecalciferol (VITAMIN D) 2000 UNITS tablet   Oral   Take 2,000 Units by mouth daily.         . Coenzyme Q10 (CO Q 10) 100 MG CAPS   Oral   Take 1 capsule by mouth daily.         Marland Kitchen denosumab (PROLIA) 60 MG/ML SOLN injection   Subcutaneous   Inject 60 mg into the skin every 6 (six) months. Administer in upper arm, thigh, or abdomen         . ferrous gluconate (FERGON) 324 MG tablet   Oral   Take 324 mg by mouth 2 (two) times a week. Takes 2 tabs on empty stomach with OJ on Monday and Friday.         Marland Kitchen HYDROcodone-acetaminophen (NORCO/VICODIN) 5-325 MG per tablet   Oral   Take 1 tablet by mouth every 6 (six) hours as needed for pain.         Marland Kitchen lisinopril (PRINIVIL,ZESTRIL) 20 MG tablet   Oral   Take 20 mg by mouth at bedtime.          Marland Kitchen loperamide  (IMODIUM) 2 MG capsule   Oral   Take 1 capsule (2 mg total) by mouth as needed for diarrhea or loose stools.   20 capsule   0   . LORazepam (ATIVAN) 0.5 MG tablet      Take 1 tablet under the tongue or swallow every 6 hours as needed for nausea.   20 tablet   0   . simvastatin (ZOCOR) 40 MG tablet   Oral   Take 1 tablet (40 mg total) by mouth at bedtime.   90 tablet   4     Must be seen for refills- last seen 11/2010   . traMADol (ULTRAM) 50 MG tablet   Oral   Take 1 tablet (50 mg total) by mouth every 6 (six) hours as needed for pain.   60 tablet   4     Must be seen for future refills- last seen 11/2010     BP 160/66  Pulse 83  Temp(Src) 97.7 F (36.5 C) (Oral)  Ht 5\' 2"  (1.575 m)  Wt 101 lb (45.813 kg)  BMI 18.47 kg/m2  SpO2 100%  Physical Exam  Nursing note and vitals reviewed. Constitutional: She is oriented to person, place, and time. She appears well-developed and well-nourished. No distress.  HENT:  Mouth/Throat: Oropharynx is clear and moist.  Eyes: No scleral icterus.  Neck: Neck supple.  Cardiovascular: Normal rate, regular rhythm and normal heart sounds.   Pulmonary/Chest: Breath sounds normal.  Abdominal: Soft. She exhibits no mass. There is no hepatosplenomegaly. There is tenderness in the suprapubic area. There is no rebound, no guarding and no CVA tenderness.  Lymphadenopathy:    She has no cervical adenopathy.  Neurological: She is alert and oriented to person, place, and time.  Skin: Skin is warm and dry.    ED Course  Procedures (including critical care time)  Labs Reviewed  POCT FASTING [facility]G [facility] MANUAL ENTRY - Abnormal; Notable for the following:    POCT Glucose ([facility]) 113 (*)    All other components within normal limits  URINE CULTURE  POCT URINALYSIS DIP (MANUAL ENTRY)   No results found.   1. UTI (lower urinary tract infection)   2. Glucose found in urine on examination       MDM  Discussed diagnosis and treatment with  patient. UTI and cystitis. No evidence of pyelonephritis or sepsis. Urinalysis positive for leukocytes. Also positive for glucose.  Please see AVS for further details.--Written instructions provided in AVS . Questions invited and answered. Prescribed Keflex 500 mg 3 times a day for 10 days . Urine culture sent. Red flags discussed. Advised patient: "Take the antibiotic 3 times a day for 10 days. Followup with your doctor in 10 days to recheck the urine to make sure that the urinary infection has resolved.--If you continue to have urinary infections, your PCP might need to refer you to a urologist. We sent off a urine culture today, results should be back within 3 days.  Also, we found some sugar in your urine, but we rechecked a fingerstick blood sugar that was normal, (nonfasting) at 113."  Patient voiced understanding and agreement with above.        Lajean Manes, MD 04/22/13 [facility]

## 2013-04-22 NOTE — ED Notes (Signed)
Reports dysuria began last night; took some AZO. Was seen here with dx UTI on 03/25/2013 and treated accordingly.

## 2013-04-23 LAB — URINE CULTURE: Colony Count: 5000

## 2013-05-02 ENCOUNTER — Encounter (HOSPITAL_COMMUNITY): Payer: Self-pay

## 2013-05-02 ENCOUNTER — Other Ambulatory Visit (HOSPITAL_BASED_OUTPATIENT_CLINIC_OR_DEPARTMENT_OTHER): Payer: Medicare Other | Admitting: Lab

## 2013-05-02 ENCOUNTER — Ambulatory Visit (HOSPITAL_BASED_OUTPATIENT_CLINIC_OR_DEPARTMENT_OTHER): Payer: Medicare Other

## 2013-05-02 ENCOUNTER — Other Ambulatory Visit: Payer: Self-pay | Admitting: *Deleted

## 2013-05-02 ENCOUNTER — Ambulatory Visit (HOSPITAL_COMMUNITY)
Admission: RE | Admit: 2013-05-02 | Discharge: 2013-05-02 | Disposition: A | Discharge status: Home / Self Care | Payer: Medicare Other | Source: Ambulatory Visit | Attending: Oncology | Admitting: Oncology

## 2013-05-02 VITALS — BP 164/63 | HR 79 | Temp 97.6°F

## 2013-05-02 DIAGNOSIS — Z9089 Acquired absence of other organs: Secondary | ICD-10-CM | POA: Insufficient documentation

## 2013-05-02 DIAGNOSIS — I2510 Atherosclerotic heart disease of native coronary artery without angina pectoris: Secondary | ICD-10-CM | POA: Insufficient documentation

## 2013-05-02 DIAGNOSIS — Z90710 Acquired absence of both cervix and uterus: Secondary | ICD-10-CM | POA: Insufficient documentation

## 2013-05-02 DIAGNOSIS — I700 Atherosclerosis of aorta: Secondary | ICD-10-CM | POA: Insufficient documentation

## 2013-05-02 DIAGNOSIS — R937 Abnormal findings on diagnostic imaging of other parts of musculoskeletal system: Secondary | ICD-10-CM | POA: Insufficient documentation

## 2013-05-02 DIAGNOSIS — Z8543 Personal history of malignant neoplasm of ovary: Secondary | ICD-10-CM | POA: Insufficient documentation

## 2013-05-02 DIAGNOSIS — C44601 Unspecified malignant neoplasm of skin of unspecified upper limb, including shoulder: Secondary | ICD-10-CM | POA: Insufficient documentation

## 2013-05-02 DIAGNOSIS — J8410 Pulmonary fibrosis, unspecified: Secondary | ICD-10-CM | POA: Insufficient documentation

## 2013-05-02 DIAGNOSIS — C4499 Other specified malignant neoplasm of skin, unspecified: Secondary | ICD-10-CM

## 2013-05-02 DIAGNOSIS — Z853 Personal history of malignant neoplasm of breast: Secondary | ICD-10-CM | POA: Insufficient documentation

## 2013-05-02 DIAGNOSIS — C801 Malignant (primary) neoplasm, unspecified: Secondary | ICD-10-CM

## 2013-05-02 LAB — COMPREHENSIVE METABOLIC PANEL (CC13)
ALT: 11 U/L (ref 0–55)
AST: 16 U/L (ref 5–34)
Albumin: 3.5 g/dL (ref 3.5–5.0)
Calcium: 9.2 mg/dL (ref 8.4–10.4)
Chloride: 101 mEq/L (ref 98–107)
Creatinine: 0.9 mg/dL (ref 0.6–1.1)
Potassium: 4.1 mEq/L (ref 3.5–5.1)
Sodium: 137 mEq/L (ref 136–145)
Total Protein: 6.2 g/dL — ABNORMAL LOW (ref 6.4–8.3)

## 2013-05-02 LAB — CBC WITH DIFFERENTIAL/PLATELET
Basophils Absolute: 0.1 10*3/uL (ref 0.0–0.1)
Eosinophils Absolute: 0.2 10*3/uL (ref 0.0–0.5)
HGB: 11.1 g/dL — ABNORMAL LOW (ref 11.6–15.9)
MCV: 92.8 fL (ref 79.5–101.0)
MONO%: 9.4 % (ref 0.0–14.0)
NEUT#: 2.9 10*3/uL (ref 1.5–6.5)
Platelets: 121 10*3/uL — ABNORMAL LOW (ref 145–400)
RDW: 12.3 % (ref 11.2–14.5)

## 2013-05-02 MED ORDER — IOHEXOL 300 MG/ML  SOLN
80.0000 mL | Freq: Once | INTRAMUSCULAR | Status: AC | PRN
  Administered 2013-05-02: 80 mL via INTRAVENOUS

## 2013-05-02 MED ORDER — SODIUM CHLORIDE 0.9 % IJ SOLN
10.0000 mL | INTRAMUSCULAR | Status: DC | PRN
  Administered 2013-05-02: 10 mL via INTRAVENOUS
  Filled 2013-05-02: qty 10

## 2013-05-02 MED ORDER — HEPARIN SOD (PORK) LOCK FLUSH 100 UNIT/ML IV SOLN
500.0000 [IU] | Freq: Once | INTRAVENOUS | Status: AC
  Administered 2013-05-02: 500 [IU] via INTRAVENOUS
  Filled 2013-05-02: qty 5

## 2013-05-02 MED ORDER — LIDOCAINE-PRILOCAINE 2.5-2.5 % EX CREA: TOPICAL_CREAM | CUTANEOUS | Status: DC

## 2013-05-02 NOTE — Patient Instructions (Signed)
Call MD for problems 

## 2013-05-02 NOTE — Progress Notes (Signed)
Pt requested scrip for EMLA cream.  Message sent to Triage.

## 2013-05-03 ENCOUNTER — Ambulatory Visit: Payer: Medicare Other | Admitting: Gynecology

## 2013-05-05 ENCOUNTER — Encounter: Payer: Self-pay | Admitting: Oncology

## 2013-05-05 ENCOUNTER — Telehealth: Payer: Self-pay | Admitting: Oncology

## 2013-05-05 ENCOUNTER — Telehealth: Payer: Self-pay

## 2013-05-05 ENCOUNTER — Ambulatory Visit (HOSPITAL_BASED_OUTPATIENT_CLINIC_OR_DEPARTMENT_OTHER): Payer: Medicare Other | Admitting: Oncology

## 2013-05-05 ENCOUNTER — Ambulatory Visit (HOSPITAL_BASED_OUTPATIENT_CLINIC_OR_DEPARTMENT_OTHER): Payer: Medicare Other | Admitting: Lab

## 2013-05-05 VITALS — BP 132/68 | HR 72 | Temp 98.2°F | Resp 18 | Ht 62.0 in | Wt 103.2 lb

## 2013-05-05 DIAGNOSIS — N390 Urinary tract infection, site not specified: Secondary | ICD-10-CM

## 2013-05-05 DIAGNOSIS — C4499 Other specified malignant neoplasm of skin, unspecified: Secondary | ICD-10-CM

## 2013-05-05 LAB — URINALYSIS, MICROSCOPIC - CHCC
Blood: NEGATIVE
Ketones: NEGATIVE mg/dL
Protein: NEGATIVE mg/dL
RBC / HPF: NEGATIVE (ref 0–2)
Specific Gravity, Urine: 1.005 (ref 1.003–1.035)
Urobilinogen, UR: 0.2 mg/dL (ref 0.2–1)
pH: 8 (ref 4.6–8.0)

## 2013-05-05 NOTE — Progress Notes (Signed)
OFFICE PROGRESS NOTE   05/05/2013   Physicians:P.Kwaitkowski, P.Drusilla Kanner, F.Lupton, B.Crenshaw, M.Martin, [facility], D.ClarkePearson. 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 Oncologic history is also significant for remote node positive left breast cancer (with lymphedema LUE related to that treatment) and IA ovarian cancer 2004. She had CT CAP for restaging on 05-02-13, fortunately with no evidence of distant metastatic disease from the porocarcinoma or the other oncologic problems. She has PAC in, flushed with CT on 05-02-13.  ONCOLOGIC HISTORY Patient was diagnosed with porocarcinoma involving 3 areas left hand and arm in spring 2012 by Dr.Lupton. She had re-excisions of all areas (dorsum left hand, 2 on left forearm and left arm) by Dr Herbie Saxon. She had significant lymphedema LUE after those procedures, with history of left breast cancer with 20 node left axillary dissection in 1994 followed by radiation. Repeat scans at [facility] in 05-2012 reportedly had no other distant disease. Because of progressive in transit mets, she had hyperthermic limb infusion by Dr Lenis Noon at [facility] in 06-2012 (chemotherapeutic agent not in notes available to me); she also had excision of an area on dorsum of left hand in Oct 2013. She had significant swelling of LUE after the limb perfusion, which gradually improved.She had progression of the in transit mets LUE after the limb perfusion, which were not symptomatic. She was seen in consultation by Dr Orvilla Fus at [facility], with recommendation for gemzar/taxotere as systemic sarcoma regimen, chosen in part due to her prior chemotherapy regimens. [facility] notes report no evidence of disease on CXR there on 11-23-2012. She had a single cycle of gemcitabine taxotere at [facility] in Feb 2014,  tolerated very poorly even with dose reduction, with hospitalization due to pancytopenia despite neulasta, requiring transfusions of PRBCs and platelets, as well as severe diarrhea with dehydration and electrolyte abnormalities, and related decrease in nutritional status and general debility; it has taken several months to improve PS back to baseline since that attempt at chemotherapy. She additionally had ovarian cancer IA clear cell in 2004, treated after surgery with 3 cycles of taxol/ carboplatin. She is to see Dr Yolande Jolly 05-02-13.  Her left breast cancer was T3N1 (2 of 20 nodes) ER+ in 1994, treated with mastectomy and axillary nodes, adriamycin/cytoxan x4, RT, and tamoxifen x 5 years. Last mammograms were at [facility] 05-12-12; she would like to have mammograms done now in [facility] facility in [facility], scheduled later this month.   She has continued to feel gradually better since last visit, tho she still tires with activity and has back discomfort from previous osteoporotic compression fractures with any extended standing or walking. Appetite is much better. She was treated for UTI recently by urgent care, those symptoms resolved; she was told to have follow up for this and we will send repeat urine specimen today. Swelling in LUE is stable, mostly forearm down. The nodule of porocarcinoma just below olecranon is uncomfortable and bleeds when hit; the other nodules are not bothersome. She wonders if she feels something different in left axilla. She has had no fever, denies increased SOB, no changes right breast, no abdominal or pelvic discomfort. Remainder of 10 point Review of Systems negative/ unchanged.  Objective:  Vital signs in last 24 hours:  BP 132/68  Pulse 72  Temp(Src) 98.2 F (36.8 C) (Oral)  Resp 18  Ht 5\' 2"  (1.575 m)  Wt 103 lb 3.2 oz (46.811 kg)  BMI 18.87 kg/m2  Weight is up 3 lbs! Quickly ambulatory, looks comfortable seated in exam room. Very pleasant, never  complains.  HEENT:PERRLA, sclera clear, anicteric and oropharynx clear, no lesions Normal hair pattern LymphaticsCervical, supraclavicular, and axillary nodes normal. No inguinal adenopathy Resp: clear to auscultation bilaterally and normal percussion bilaterally Cardio: regular rate and rhythm GI: soft, non-tender; bowel sounds normal; no masses,  no organomegaly. Surgical incisions healed, not distended Extremities: RUE and bilateral LE without edema, cords, tenderness Neuro: no focal deficits Breast: right atrophic, question slight thickening 9:00, no tenderness, no skin or nipple findings. Left mastectomy scar not remarkable. Essentially no adipose tissue across left chest wall or in left axilla, with area questioned in axilla apparently a small amount of soft tissue overlying rib. LUE with 2+ lymphedema in forearm. The in transit porocarcinoma nodules are 1-3 mm primarily, however the nodule below olecranon is 6 x 8 mm diameter and raised, firm. Portacath/ without erythema or tenderness (this placed for the gemar/ taxotere which was [facility] after one cycle).  Lab Results:  Results for orders placed in visit on 05/02/13  CBC WITH DIFFERENTIAL      Result Value Range   WBC 4.4  3.9 - 10.3 10e3/uL   NEUT# 2.9  1.5 - 6.5 10e3/uL   HGB 11.1 (*) 11.6 - 15.9 g/dL   HCT 16.1 (*) 09.6 - 04.5 %   Platelets 121 (*) 145 - 400 10e3/uL   MCV 92.8  79.5 - 101.0 fL   MCH 33.0  25.1 - 34.0 pg   MCHC 35.5  31.5 - 36.0 g/dL   RBC 4.09 (*) 8.11 - 9.14 10e6/uL   RDW 12.3  11.2 - 14.5 %   lymph# 0.9  0.9 - 3.3 10e3/uL   MONO# 0.4  0.1 - 0.9 10e3/uL   Eosinophils Absolute 0.2  0.0 - 0.5 10e3/uL   Basophils Absolute 0.1  0.0 - 0.1 10e3/uL   NEUT% 66.0  38.4 - 76.8 %   LYMPH% 19.9  14.0 - 49.7 %   MONO% 9.4  0.0 - 14.0 %   EOS% 3.6  0.0 - 7.0 %   BASO% 1.1  0.0 - 2.0 %  COMPREHENSIVE METABOLIC PANEL (CC13)      Result Value Range   Sodium 137  136 - 145 mEq/L   Potassium 4.1  3.5 - 5.1 mEq/L    Chloride 101  98 - 107 mEq/L   CO2 27  22 - 29 mEq/L   Glucose 98  70 - 99 mg/dl   BUN 78.2  7.0 - 95.6 mg/dL   Creatinine 0.9  0.6 - 1.1 mg/dL   Total Bilirubin 2.13  0.20 - 1.20 mg/dL   Alkaline Phosphatase 51  40 - 150 U/L   AST 16  5 - 34 U/L   ALT 11  0 - 55 U/L   Total Protein 6.2 (*) 6.4 - 8.3 g/dL   Albumin 3.5  3.5 - 5.0 g/dL   Calcium 9.2  8.4 - 08.6 mg/dL    Follow up urine sent and pending from treatment of UTI ~ 2 weeks ago thru urgent care.  Studies/Results: CT CHEST 05-02-13 Findings: No pleural effusion identified. There is subpleural  thickening and fibrosis within the left apex, images 5-9.  Associated volume loss of the left upper lobe is noted.  Normal heart size. There are prominent coronary artery  calcifications involving the LAD  and the left circumflex coronary  artery. No pericardial effusion identified. There is no enlarged  mediastinal or hilar lymph nodes identified.  No supraclavicular or axillary adenopathy identified. Left  shoulder arthroplasty has been performed. The thyroid gland  appears normal.  No aggressive lytic or sclerotic bone lesions identified. The  vertebral plana deformity involves the T12 vertebra. This is  unchanged from 01/27/2013. Focal area of asymmetric sclerosis is  noted involving the lateral aspect of the right fifth rib.  IMPRESSION:  1. Pleural thickening and fibrosis within the left apex is  identified and may be the sequela of external beam radiation.  2. Nonspecific, indeterminate a sclerosis lesion involving the  lateral aspect of the right fifth rib. Recommend further assessment  with bone scintigraphy to look for additional evidence of bone  metastasis.  3. T12 vertebral plana  CT ABDOMEN AND PELVIS  Findings: No suspicious liver abnormality identified. Prior  cholecystectomy.  No significant biliary dilatation. The pancreas is within normal  limits. Normal appearance of the spleen.  The adrenal glands are both  normal. Bilateral pelvocaliectasis  identified. The urinary bladder appears normal. Prior  hysterectomy.  Calcified atherosclerotic disease affects the abdominal aorta. No  aneurysm. There is no upper abdominal adenopathy noted. No pelvic  or inguinal adenopathy noted.  There is no ascites or focal fluid collections within the abdomen  or pelvis. The stomach appears normal. The small bowel loops are  unremarkable. Normal appearance of the colon.  There is no ascites or focal fluid collections identified within  the abdomen or pelvis. No suspicious peritoneal nodule or mass  noted.  Review of the visualized osseous structures shows no aggressive  lytic or sclerotic bone lesions.  IMPRESSION:  1. No specific features identified to suggest metastatic disease  to the abdomen or pelvis.  2. Prior cholecystectomy.  3. Bilateral pelvocaliectasis appears similar to previous  examination.  We have discussed results of the CTs in general, with nothing that we can tell is metastatic or recurrent cancer and nothing visualied in left axilla.  Medications: I have reviewed the patient's current medications.  We have discussed options for the intransit porocarcinoma, keeping in mind her extremely difficult time with the single treatment of gemar taxotere (tho she did have some decrease in size of lesions following that chemo) and the lymphedema problems LUE related to previous axillary node dissection and radiation (worse since limb perfusion). We would like consultation with Dr Kathrynn Running to consider localized RT especially to the symptomatic nodule at olecranon. Otherwise with the unremarkable scans and rest of situation, we will continue observation.   She will need to continue follow up med onc/ gyn onc. The PAC needs flush at least every 8 weeks. Peripheral IV access is very poor, so we are keeping this for now.  Assessment/Plan:  1.In transit porocarcinoma of LUE: not known to have distant mets:  progression after limb perfusion at [facility] early fall 2013, then single cycle of dose reduced gemzar taxotere which she tolerated extremely poorly.  Consultation with Dr Kathrynn Running - thank you. Continue observation, back to medical oncology coordinating with PAC flush at least early Oct. 2.history of high grade IA ovarian carcinoma, not known active, for visit back to Dr Yolande Jolly next week. 3.history of node positive left breast cancer with 20 node left axillary dissection and local radiation , and increased lymphedema since porocarcinoma involving that arm . Breast cancer not known recurrent  4.PAC in due to the chemotherapy, very difficult peripheral access, so  keeping for now including flushes every 6-8 weeks, due Aug 1 and Oct 7.  5.cardiomyopathy in 2006, normal EF by echo March 2012. LBBB  6.osteoporosis with previous vertebral compression fracture  7. Poor nutritional status and significant deconditioning improving  Next apt Dr Franki Cabot (PCP) ~ Sept. Patient knows to be in touch with RN if any scheduling needs.  Treylen Gibbs P, MD   05/05/2013, 1:55 PM

## 2013-05-05 NOTE — Telephone Encounter (Signed)
Told Ms. Garr that the urinalysis looked good per Dr. Darrold Span.  Will make sure with urine culture that the infection is gone from 2 weeks ago.   Will call her if the urine still shows infection. She is going to be getting mammogram done at [facility] next week on 05-12-13. Will notify Dr. Katheren Puller that mammogram is set up.

## 2013-05-05 NOTE — Telephone Encounter (Signed)
Gave pt appt for lab , flush , MD and Radiation for June 2014 and flush

## 2013-05-05 NOTE — Patient Instructions (Signed)
We will ask Dr Margaretmary Bayley if he thinks some very directed radiation to the area at left elbow would be reasonable   Portacath needs to be flushed every 6-8 weeks, last done with the CT 05-02-13

## 2013-05-06 LAB — URINE CULTURE

## 2013-05-08 ENCOUNTER — Telehealth: Payer: Self-pay

## 2013-05-08 ENCOUNTER — Telehealth: Payer: Self-pay | Admitting: *Deleted

## 2013-05-08 NOTE — Telephone Encounter (Signed)
Patient called and moved her appt from 8/12 to 8/11. Appt moved.  JMW

## 2013-05-08 NOTE — Telephone Encounter (Signed)
Message copied by Lorine Bears on Mon May 08, 2013  3:38 PM ------      Message from: Reece Packer      Created: Mon May 08, 2013 11:27 AM       Labs seen and need follow up please let her know that the urine infection is completely gone ------

## 2013-05-08 NOTE — Telephone Encounter (Signed)
Told Ms. Mchatton results of urine culture as noted below by Dr. Darrold Span.   Patient verbalized understanding.

## 2013-05-10 ENCOUNTER — Other Ambulatory Visit: Payer: Self-pay | Admitting: Internal Medicine

## 2013-05-12 ENCOUNTER — Ambulatory Visit: Payer: Medicare Other | Attending: Gynecology | Admitting: Gynecology

## 2013-05-12 ENCOUNTER — Encounter: Payer: Self-pay | Admitting: Gynecology

## 2013-05-12 ENCOUNTER — Ambulatory Visit (HOSPITAL_BASED_OUTPATIENT_CLINIC_OR_DEPARTMENT_OTHER)
Admission: RE | Admit: 2013-05-12 | Discharge: 2013-05-12 | Disposition: A | Discharge status: Home / Self Care | Payer: Medicare Other | Source: Ambulatory Visit | Attending: Oncology | Admitting: Oncology

## 2013-05-12 VITALS — BP 102/62 | HR 70 | Temp 98.5°F | Resp 16 | Ht 62.0 in | Wt 102.2 lb

## 2013-05-12 DIAGNOSIS — Z79899 Other long term (current) drug therapy: Secondary | ICD-10-CM | POA: Insufficient documentation

## 2013-05-12 DIAGNOSIS — I10 Essential (primary) hypertension: Secondary | ICD-10-CM | POA: Insufficient documentation

## 2013-05-12 DIAGNOSIS — M810 Age-related osteoporosis without current pathological fracture: Secondary | ICD-10-CM | POA: Insufficient documentation

## 2013-05-12 DIAGNOSIS — E785 Hyperlipidemia, unspecified: Secondary | ICD-10-CM | POA: Insufficient documentation

## 2013-05-12 DIAGNOSIS — I2510 Atherosclerotic heart disease of native coronary artery without angina pectoris: Secondary | ICD-10-CM | POA: Insufficient documentation

## 2013-05-12 DIAGNOSIS — C569 Malignant neoplasm of unspecified ovary: Secondary | ICD-10-CM | POA: Insufficient documentation

## 2013-05-12 DIAGNOSIS — Z853 Personal history of malignant neoplasm of breast: Secondary | ICD-10-CM | POA: Insufficient documentation

## 2013-05-12 DIAGNOSIS — Z1231 Encounter for screening mammogram for malignant neoplasm of breast: Secondary | ICD-10-CM | POA: Insufficient documentation

## 2013-05-12 DIAGNOSIS — Z9010 Acquired absence of unspecified breast and nipple: Secondary | ICD-10-CM | POA: Insufficient documentation

## 2013-05-12 DIAGNOSIS — Z9221 Personal history of antineoplastic chemotherapy: Secondary | ICD-10-CM | POA: Insufficient documentation

## 2013-05-12 NOTE — Progress Notes (Signed)
Consult Note: Gyn-Onc   Judy ALAMILLO 79 y.o. female  Chief Complaint  Patient presents with  . Ovarian Cancer    Follow up   Assessment/Plan: Stage IA  clear-cell carcinoma of the ovary 2004. Clinically NED. : Given her a new cancer of her left arm, we will not obtain a CA 125 today as it potentially could be falsely elevated.She will return to see [place] in one year for continuing followup.  Interval History:  The patient returns today as previously scheduled for routine followup of ovarian cancer. Since her last visit a year ago she's done well.   She denies any GI or GU symptoms. She has no abdominal bloating or pelvic pressure pain or bleeding. Functional status is good.  HPI: Stage IA  clear-cell carcinoma the ovary. The patient underwent initial surgical staging followed by 3 cycles of carboplatin and Taxol chemotherapy completed in November of 2004. She's done well with no evidence of recurrent disease.  Review of Systems:10 point review of systems is negative as noted above.   Vitals: Blood pressure 102/62, pulse 70, temperature 98.5 F (36.9 C), temperature source Oral, resp. rate 16, height 5\' 2"  (1.575 m), weight 46.358 kg (102 lb 3.2 oz).  Physical Exam: General : The patient is a healthy woman in no acute distress.  HEENT: normocephalic, extraoccular movements normal; neck is supple without thyromegally  Lynphnodes: Supraclavicular and inguinal nodes not enlarged  Abdomen: Soft, non-tender, no ascites, no organomegally, no masses, no hernias  Pelvic:  EGBUS: Normal female  Vagina: Normal, no lesions  Urethra and Bladder: Normal, non-tender  Cervix: Surgically absent  Uterus: Surgically absent  Bi-manual examination: Non-tender; no adenxal masses or nodularity  Rectal: normal sphincter tone, no masses, no blood  Lower extremities: No edema or varicosities. Normal range of motion     Allergies  Allergen Reactions  . Ibuprofen Nausea Only  . Morphine Nausea And  Vomiting    Past Medical History  Diagnosis Date  . Hypertension   . Allergy   . Cancer   . Ovarian ca   . Cataract   . Breast CA   . CAD (coronary artery disease)   . Hyperlipidemia   . Cardiomyopathy     improved  . Arthritis   . Osteoporosis     Past Surgical History  Procedure Laterality Date  . Left breast mastectomy  1994    T3 N1 Mx  . Shoulder arthroscopy    . Right leg    . Shoulder surgery      replacement  . Modified radical mastectomy w/ axillary lymph node dissection w/ pectoralis minor excision  1994    Left  . Eye surgery      CATARACTS  . Abdominal hysterectomy  2004  . Cholecystectomy  1993  . Breast surgery      Current Outpatient Prescriptions  Medication Sig Dispense Refill  . acyclovir (ZOVIRAX) 400 MG tablet Take 400 mg by mouth 3 (three) times daily as needed (for fever blisters.).      Marland Kitchen amLODipine (NORVASC) 2.5 MG tablet TAKE 1 TABLET DAILY.  30 tablet  3  . aspirin EC 81 MG tablet Take 81 mg by mouth at bedtime.       . Calcium Carbonate-Vitamin D (CALCIUM + D PO) Take 600 mg by mouth daily after lunch.       . carvedilol (COREG) 25 MG tablet Take 1 tablet (25 mg total) by mouth 2 (two) times daily with a meal.  180 tablet  5  . Cholecalciferol (VITAMIN D) 2000 UNITS tablet Take 2,000 Units by mouth daily.      . Coenzyme Q10 (CO Q 10) 100 MG CAPS Take 1 capsule by mouth daily.      Marland Kitchen denosumab (PROLIA) 60 MG/ML SOLN injection Inject 60 mg into the skin every 6 (six) months. Administer in upper arm, thigh, or abdomen      . ferrous gluconate (FERGON) 324 MG tablet Take 324 mg by mouth 2 (two) times a week. Takes 2 tabs on empty stomach with OJ on Monday and Friday.      Marland Kitchen HYDROcodone-acetaminophen (NORCO/VICODIN) 5-325 MG per tablet Take 1 tablet by mouth every 6 (six) hours as needed for pain.      Marland Kitchen lidocaine-prilocaine (EMLA) cream Apply cream to port-a-cath 1.5 hours before chemotherapy.  Cover cream with band-aid or plastic.  30 g  3  .  lisinopril (PRINIVIL,ZESTRIL) 20 MG tablet Take 20 mg by mouth at bedtime.       Marland Kitchen loperamide (IMODIUM) 2 MG capsule Take 1 capsule (2 mg total) by mouth as needed for diarrhea or loose stools.  20 capsule  0  . LORazepam (ATIVAN) 0.5 MG tablet Take 1 tablet under the tongue or swallow every 6 hours as needed for nausea.  20 tablet  0  . simvastatin (ZOCOR) 40 MG tablet Take 1 tablet (40 mg total) by mouth at bedtime.  90 tablet  4  . traMADol (ULTRAM) 50 MG tablet Take 1 tablet (50 mg total) by mouth every 6 (six) hours as needed for pain.  60 tablet  4   No current facility-administered medications for this visit.    History   Social History  . Marital Status: Widowed    Spouse Name: N/A    Number of Children: N/A  . Years of Education: N/A   Occupational History  . Not on file.   Social History Main Topics  . Smoking status: Never Smoker   . Smokeless tobacco: Never Used  . Alcohol Use: No  . Drug Use: No  . Sexually Active: Not on file     Comment: didn't ask   Other Topics Concern  . Not on file   Social History Narrative  . No narrative on file    Family History  Problem Relation Age of Onset  . Breast cancer Sister   . Cancer Sister   . Heart disease Sister   . Heart disease Father   . Heart disease Mother       Jeannette Corpus, MD 05/12/2013, 12:56 PM

## 2013-05-12 NOTE — Patient Instructions (Signed)
We will have another visit in one year.

## 2013-05-13 ENCOUNTER — Encounter: Payer: Self-pay | Admitting: Radiation Oncology

## 2013-05-13 NOTE — Progress Notes (Signed)
Histology and Location of Primary Cancer: porocarcinoma of left dorsal hand, left forearm and left posterior arm above elbow  Location(s) of Symptomatic tumor(s): nodule at olecranon  Past/Anticipated chemotherapy by medical oncology, if any: past treatment with single cycle of gemzar Taxotere which she tolerated extremely poorly.  Patient's main complaints related to symptomatic tumor(s) are:   Pain on a scale of 0-10 is:     If Spine Met(s), symptoms, if any, include:  Bowel/Bladder retention or incontinence (please describe):   Numbness or weakness in extremities (please describe):   Current Decadron regimen, if applicable: none  Ambulatory status? Walker? Wheelchair?:  SAFETY ISSUES:  Prior radiation? Left chest wall 1994   Pacemaker/ICD? no  Possible current pregnancy? no  Is the patient on methotrexate? no  Additional Complaints / other details:  Patient has a history of left breast cancer (1994) T3N1 (2 of 20 nodes) ER+ in 1994, treated with mastectomy and axillary nodes, adriamycin/cytoxan x4, RT, and tamoxifen x 5 years and ovarian cancer (2004) treated with 3 cycles of taxol/carboplatin.  Patient has lymphedema in left arm.

## 2013-05-14 ENCOUNTER — Encounter: Payer: Self-pay | Admitting: Radiation Oncology

## 2013-05-14 NOTE — Progress Notes (Addendum)
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 female with a history of breast and ovarian cancer.  1994, she underwent left mastectomy for stage T3N1 (2 of 20 nodes) ER+ breast cancer followed Adriamycin and Cytoxan x4 cycles and chest wall radiotherapy under the care of Dr Genelle Bal followed by 5 years of tamoxifen..  Reportedly, she has had some left upper extremity lymphedema, but no evidence of recurrent disease. Subsequently,  the patient underwent surgical staging for stage IA clear-cell carcinoma the ovary followed by 3 cycles of carboplatin and Taxol chemotherapy completed in November of 2004. She's done well with no evidence of recurrent disease.  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 She had re-excisions of all areas (dorsum left hand, 2 on left forearm and left arm) by Dr Herbie Saxon. She had significant lymphedema LUE after those procedures, with history of left breast cancer with 20 node left axillary dissection in 1994 followed by radiation. Repeat scans at [facility] in 05-2012 reportedly had no other distant disease. 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 She had significant swelling of LUE after the limb perfusion, which gradually improved.She had progression of the in transit mets LUE after the limb perfusion, which were not symptomatic. She was  seen in consultation by Dr Orvilla Fus at [facility], with recommendation for gemzar/taxotere as systemic sarcoma regimen, chosen in part due to her prior chemotherapy regimens. [facility] notes report no evidence of disease on CXR there on 11-23-2012. She had a single cycle of gemcitabine taxotere at [facility] in Feb 2014, tolerated very poorly even with dose reduction, with hospitalization due to pancytopenia despite neulasta, requiring transfusions of PRBCs and platelets, as well as severe diarrhea with dehydration and electrolyte abnormalities, and related decrease in nutritional status and general debility; it has taken several months to improve PS back to baseline since that attempt at chemotherapy.   Swelling in LUE is stable, mostly forearm down. The nodule of porocarcinoma just below olecranon is uncomfortable and bleeds when hit; the other nodules are not bothersome. She wonders if she feels something different in left axilla.   Today, she has kindly been referred for discussion of possible palliative radiation.  PREVIOUS RADIATION THERAPY: Yes as above  PAST MEDICAL HISTORY:  has a past medical history of Hypertension; Allergy; Cancer; Ovarian ca; Cataract; Breast CA; CAD (coronary artery disease); Hyperlipidemia; Cardiomyopathy; Arthritis; Osteoporosis; History of radiation therapy (1994); and History of chemotherapy.    PAST SURGICAL HISTORY: Past Surgical History  Procedure Laterality Date  . Left breast mastectomy  1994    T3 N1 Mx  . Shoulder arthroscopy    . Right leg    . Shoulder surgery      replacement  . Modified radical mastectomy w/ axillary lymph node dissection w/ pectoralis minor excision  1994    Left  . Eye surgery      CATARACTS  .  Abdominal hysterectomy  2004  . Cholecystectomy  1993  . Breast surgery    . Skin biopsy  04/27/2011    left hand, forearm and posterior arm    FAMILY HISTORY: family history includes Breast cancer in her sister; Cancer in her sister; and  Heart disease in her father, mother, and sister.  SOCIAL HISTORY:  reports that she has never smoked. She has never used smokeless tobacco. She reports that she does not drink alcohol or use illicit drugs.  ALLERGIES: Ibuprofen and Morphine  MEDICATIONS:  Current Outpatient Prescriptions  Medication Sig Dispense Refill  . acyclovir (ZOVIRAX) 400 MG tablet Take 400 mg by mouth 3 (three) times daily as needed (for fever blisters.).      Marland Kitchen amLODipine (NORVASC) 2.5 MG tablet TAKE 1 TABLET DAILY.  30 tablet  3  . aspirin EC 81 MG tablet Take 81 mg by mouth at bedtime.       . Calcium Carbonate-Vitamin D (CALCIUM + D PO) Take 600 mg by mouth daily after lunch.       . carvedilol (COREG) 25 MG tablet Take 1 tablet (25 mg total) by mouth 2 (two) times daily with a meal.  180 tablet  5  . Cholecalciferol (VITAMIN D) 2000 UNITS tablet Take 2,000 Units by mouth daily.      . Coenzyme Q10 (CO Q 10) 100 MG CAPS Take 1 capsule by mouth daily.      Marland Kitchen denosumab (PROLIA) 60 MG/ML SOLN injection Inject 60 mg into the skin every 6 (six) months. Administer in upper arm, thigh, or abdomen      . ferrous gluconate (FERGON) 324 MG tablet Take 324 mg by mouth 2 (two) times a week. Takes 2 tabs on empty stomach with OJ on Monday and Friday.      Marland Kitchen HYDROcodone-acetaminophen (NORCO/VICODIN) 5-325 MG per tablet Take 1 tablet by mouth every 6 (six) hours as needed for pain.      Marland Kitchen lidocaine-prilocaine (EMLA) cream Apply cream to port-a-cath 1.5 hours before chemotherapy.  Cover cream with band-aid or plastic.  30 g  3  . lisinopril (PRINIVIL,ZESTRIL) 20 MG tablet Take 20 mg by mouth at bedtime.       Marland Kitchen loperamide (IMODIUM) 2 MG capsule Take 1 capsule (2 mg total) by mouth as needed for diarrhea or loose stools.  20 capsule  0  . LORazepam (ATIVAN) 0.5 MG tablet Take 1 tablet under the tongue or swallow every 6 hours as needed for nausea.  20 tablet  0  . simvastatin (ZOCOR) 40 MG tablet Take 1 tablet (40 mg total) by  mouth at bedtime.  90 tablet  4  . traMADol (ULTRAM) 50 MG tablet Take 1 tablet (50 mg total) by mouth every 6 (six) hours as needed for pain.  60 tablet  4   No current facility-administered medications for this encounter.    REVIEW OF SYSTEMS:  A 15 point review of systems is documented in the electronic medical record. This was obtained by the nursing staff. However, I reviewed this with the patient to discuss relevant findings and make appropriate changes.  Pertinent items are noted in HPI.   PHYSICAL EXAM:  height is 5\' 2"  (1.575 m) and weight is 100 lb 6.4 oz (45.541 kg). Her oral temperature is 97.9 F (36.6 C). Her blood pressure is 130/56 and her pulse is 64. Her respiration is 18 and oxygen saturation is 100%.   The patient is a frail elderly woman in  no acute stress. She is alert and oriented. She is well-groomed appears younger than her stated age.. Focused examination of the left arm reveals mild lymphedema. The patient does have multiple 2-3 mm purplish nodules involving the skin of the left forearm with intact overlying skin. In addition, she is an 8 mm purplish nodule over the left olecranon with some breakdown of the overlying skin but no active bleeding or signs of infection. None of the cutaneous nodules are tender. In addition, patient has a subcutaneous presumed epitrochlear lymph node on the left which is nontender.  LABORATORY DATA:  Lab Results  Component Value Date   WBC 4.4 05/02/2013   HGB 11.1* 05/02/2013   HCT 31.3* 05/02/2013   MCV 92.8 05/02/2013   PLT 121* 05/02/2013   Lab Results  Component Value Date   NA 137 05/02/2013   K 4.1 05/02/2013   CL 101 05/02/2013   CO2 27 05/02/2013   Lab Results  Component Value Date   ALT 11 05/02/2013   AST 16 05/02/2013   ALKPHOS 51 05/02/2013   BILITOT 1.08 05/02/2013     RADIOGRAPHY: Ct Chest W Contrast  05/02/2013   *RADIOLOGY REPORT*  Clinical Data:  Restaging porocarcinoma left upper extremity  CT CHEST, ABDOMEN AND  PELVIS WITH CONTRAST  Technique:  Multidetector CT imaging of the chest, abdomen and pelvis was performed following the standard protocol during bolus administration of intravenous contrast.  Contrast: 80mL OMNIPAQUE IOHEXOL 300 MG/ML  SOLN  Comparison:  CT abdomen and pelvis 06/29/2010  CT CHEST  Findings:  No pleural effusion identified.  There is subpleural thickening and fibrosis within the left apex, images 5-9. Associated volume loss of the left upper lobe is noted.  Normal heart size.  There are prominent coronary artery calcifications involving the LAD and the left circumflex coronary artery.  No pericardial effusion identified.  There is no enlarged mediastinal or hilar lymph nodes identified.  No supraclavicular or axillary adenopathy identified.  Left shoulder arthroplasty has been performed.  The thyroid gland appears normal.  No aggressive lytic or sclerotic bone lesions identified.  The vertebral plana deformity involves the T12 vertebra.  This is unchanged from 01/27/2013.  Focal area of asymmetric sclerosis is noted involving the lateral aspect of the right fifth rib.  IMPRESSION:  1.  Pleural thickening and fibrosis within the left apex is identified and may be the sequela of external beam radiation. 2. Nonspecific, indeterminate a sclerosis lesion involving the lateral aspect of the right fifth rib. Recommend further assessment with bone scintigraphy to look for additional evidence of bone metastasis. 3.  T12 vertebral plana  CT ABDOMEN AND PELVIS  Findings:  No suspicious liver abnormality identified.  Prior cholecystectomy. No significant biliary dilatation.  The pancreas is within normal limits.  Normal appearance of the spleen.  The adrenal glands are both normal.  Bilateral pelvocaliectasis identified.  The urinary bladder appears normal.  Prior hysterectomy.  Calcified atherosclerotic disease affects the abdominal aorta.  No aneurysm.  There is no upper abdominal adenopathy noted.  No pelvic  or inguinal adenopathy noted.  There is no ascites or focal fluid collections within the abdomen or pelvis.  The stomach appears normal.  The small bowel loops are unremarkable.  Normal appearance of the colon.  There is no ascites or focal fluid collections identified within the abdomen or pelvis.  No suspicious peritoneal nodule or mass noted.  Review of the visualized osseous structures shows no aggressive lytic or sclerotic bone lesions.  IMPRESSION:  1.  No specific features identified to suggest metastatic disease to the abdomen or pelvis. 2.  Prior cholecystectomy. 3.  Bilateral pelvocaliectasis appears similar to previous examination.   Original Report Authenticated By: Signa Kell, M.D.   Ct Abdomen Pelvis W Contrast  05/02/2013   *RADIOLOGY REPORT*  Clinical Data:  Restaging porocarcinoma left upper extremity  CT CHEST, ABDOMEN AND PELVIS WITH CONTRAST  Technique:  Multidetector CT imaging of the chest, abdomen and pelvis was performed following the standard protocol during bolus administration of intravenous contrast.  Contrast: 80mL OMNIPAQUE IOHEXOL 300 MG/ML  SOLN  Comparison:  CT abdomen and pelvis 06/29/2010  CT CHEST  Findings:  No pleural effusion identified.  There is subpleural thickening and fibrosis within the left apex, images 5-9. Associated volume loss of the left upper lobe is noted.  Normal heart size.  There are prominent coronary artery calcifications involving the LAD and the left circumflex coronary artery.  No pericardial effusion identified.  There is no enlarged mediastinal or hilar lymph nodes identified.  No supraclavicular or axillary adenopathy identified.  Left shoulder arthroplasty has been performed.  The thyroid gland appears normal.  No aggressive lytic or sclerotic bone lesions identified.  The vertebral plana deformity involves the T12 vertebra.  This is unchanged from 01/27/2013.  Focal area of asymmetric sclerosis is noted involving the lateral aspect of the right  fifth rib.  IMPRESSION:  1.  Pleural thickening and fibrosis within the left apex is identified and may be the sequela of external beam radiation. 2. Nonspecific, indeterminate a sclerosis lesion involving the lateral aspect of the right fifth rib. Recommend further assessment with bone scintigraphy to look for additional evidence of bone metastasis. 3.  T12 vertebral plana  CT ABDOMEN AND PELVIS  Findings:  No suspicious liver abnormality identified.  Prior cholecystectomy. No significant biliary dilatation.  The pancreas is within normal limits.  Normal appearance of the spleen.  The adrenal glands are both normal.  Bilateral pelvocaliectasis identified.  The urinary bladder appears normal.  Prior hysterectomy.  Calcified atherosclerotic disease affects the abdominal aorta.  No aneurysm.  There is no upper abdominal adenopathy noted.  No pelvic or inguinal adenopathy noted.  There is no ascites or focal fluid collections within the abdomen or pelvis.  The stomach appears normal.  The small bowel loops are unremarkable.  Normal appearance of the colon.  There is no ascites or focal fluid collections identified within the abdomen or pelvis.  No suspicious peritoneal nodule or mass noted.  Review of the visualized osseous structures shows no aggressive lytic or sclerotic bone lesions.  IMPRESSION:  1.  No specific features identified to suggest metastatic disease to the abdomen or pelvis. 2.  Prior cholecystectomy. 3.  Bilateral pelvocaliectasis appears similar to previous examination.   Original Report Authenticated By: Signa Kell, M.D.   Mm Digital Screening Unilat R  05/12/2013   *RADIOLOGY REPORT*  Clinical Data: Screening.  DIGITAL SCREENING UNILATERAL RIGHT MAMMOGRAM WITH CAD  Comparison:  Previous exams.  FINDINGS:  ACR Breast Density Category d:  The breasts are extremely dense, which lowers the sensitivity of mammography.  There are no findings suspicious for malignancy. Right sided Port-A- Cath in  place.  Images were processed with CAD.  IMPRESSION: No mammographic evidence of malignancy.  A result letter of this screening mammogram will be mailed directly to the patient.  RECOMMENDATION: Screening mammogram in one year. (Code:SM-B-01Y)  BI-RADS CATEGORY 1:  Negative.   Original Report Authenticated  By: Christiana Pellant, M.D.     IMPRESSION: Patient is a very nice 79 year old woman with recurrent multifocal porocarcinomas of the left arm with a remote history of left chest wall radiotherapy. She has some tenderness and superficial bleeding of a larger nodule on the left olecranon area. While literature is limited for this rare type of malignancy, there is some evidence that radiotherapy can be an effective modality primarily as an adjunct to surgery.  PLAN:Today, I talked to the patient and family about the findings and work-up thus far.  We discussed the natural history of disease and general treatment, highlighting the role or radiotherapy in the management.  We discussed the available radiation techniques, and focused on the details of logistics and delivery.  We reviewed the anticipated acute and late sequelae associated with radiation in this setting.  The patient was encouraged to ask questions that I answered to the best of my ability.  We retained a copy for our records.  The patient would like to proceed with radiation and will be scheduled for CT simulation.  I spent 60 minutes minutes face to face with the patient and more than 50% of that time was spent in counseling and/or coordination of care.   ------------------------------------------------  Artist Pais. Kathrynn Running, M.D.

## 2013-05-15 ENCOUNTER — Ambulatory Visit
Admission: RE | Admit: 2013-05-15 | Discharge: 2013-05-15 | Disposition: A | Discharge status: Home / Self Care | Payer: Medicare Other | Source: Ambulatory Visit | Attending: Radiation Oncology | Admitting: Radiation Oncology

## 2013-05-15 ENCOUNTER — Encounter: Payer: Self-pay | Admitting: Radiation Oncology

## 2013-05-15 VITALS — BP 130/56 | HR 64 | Temp 97.9°F | Resp 18 | Ht 62.0 in | Wt 100.4 lb

## 2013-05-15 DIAGNOSIS — Z9221 Personal history of antineoplastic chemotherapy: Secondary | ICD-10-CM | POA: Insufficient documentation

## 2013-05-15 DIAGNOSIS — Z9010 Acquired absence of unspecified breast and nipple: Secondary | ICD-10-CM | POA: Insufficient documentation

## 2013-05-15 DIAGNOSIS — Z853 Personal history of malignant neoplasm of breast: Secondary | ICD-10-CM | POA: Insufficient documentation

## 2013-05-15 DIAGNOSIS — I10 Essential (primary) hypertension: Secondary | ICD-10-CM | POA: Insufficient documentation

## 2013-05-15 DIAGNOSIS — C4499 Other specified malignant neoplasm of skin, unspecified: Secondary | ICD-10-CM

## 2013-05-15 DIAGNOSIS — I700 Atherosclerosis of aorta: Secondary | ICD-10-CM | POA: Insufficient documentation

## 2013-05-15 DIAGNOSIS — C44691 Other specified malignant neoplasm of skin of unspecified upper limb, including shoulder: Secondary | ICD-10-CM | POA: Insufficient documentation

## 2013-05-15 DIAGNOSIS — M810 Age-related osteoporosis without current pathological fracture: Secondary | ICD-10-CM | POA: Insufficient documentation

## 2013-05-15 DIAGNOSIS — Z90710 Acquired absence of both cervix and uterus: Secondary | ICD-10-CM | POA: Insufficient documentation

## 2013-05-15 DIAGNOSIS — D2330 Other benign neoplasm of skin of unspecified part of face: Secondary | ICD-10-CM

## 2013-05-15 DIAGNOSIS — Z8543 Personal history of malignant neoplasm of ovary: Secondary | ICD-10-CM | POA: Insufficient documentation

## 2013-05-15 DIAGNOSIS — I2510 Atherosclerotic heart disease of native coronary artery without angina pectoris: Secondary | ICD-10-CM | POA: Insufficient documentation

## 2013-05-15 DIAGNOSIS — I890 Lymphedema, not elsewhere classified: Secondary | ICD-10-CM | POA: Insufficient documentation

## 2013-05-15 DIAGNOSIS — Z923 Personal history of irradiation: Secondary | ICD-10-CM | POA: Insufficient documentation

## 2013-05-15 DIAGNOSIS — Z79899 Other long term (current) drug therapy: Secondary | ICD-10-CM | POA: Insufficient documentation

## 2013-05-15 DIAGNOSIS — E785 Hyperlipidemia, unspecified: Secondary | ICD-10-CM | POA: Insufficient documentation

## 2013-05-15 HISTORY — DX: Personal history of irradiation: Z92.3

## 2013-05-15 HISTORY — DX: Personal history of antineoplastic chemotherapy: Z92.21

## 2013-05-15 NOTE — Progress Notes (Signed)
See progress note under physician encounter. 

## 2013-05-15 NOTE — Progress Notes (Signed)
Complete PATIENT MEASURE OF DISTRESS worksheet with a score of 0 submitted to social work.  

## 2013-05-15 NOTE — Progress Notes (Signed)
Swelling in LUE is stable, mostly forearm down. The raised nodule of porocarcinoma just below olecranon is uncomfortable and bleeds when hit about the size of a dime; the other nodules are not bothersome just above her elbow on the posterior side also the size of a dime. Reports occasionally she is able to palpate an nodule that Dr. Darrold Span tells her is bone in her left axilla. Reports having hot flashes during the night. Denies nausea, vomiting, headache, dizziness or unintentional weight loss. Reports nodule on left hand between third and fourth digit was removed three years ago by Dr. Lenis Noon but, had been present for some three years before that.

## 2013-05-17 ENCOUNTER — Ambulatory Visit: Admission: RE | Admit: 2013-05-17 | Payer: Medicare Other | Source: Ambulatory Visit | Admitting: Radiation Oncology

## 2013-05-18 ENCOUNTER — Ambulatory Visit
Admission: RE | Admit: 2013-05-18 | Discharge: 2013-05-18 | Disposition: A | Discharge status: Home / Self Care | Payer: Medicare Other | Source: Ambulatory Visit | Attending: Radiation Oncology | Admitting: Radiation Oncology

## 2013-05-18 DIAGNOSIS — C44691 Other specified malignant neoplasm of skin of unspecified upper limb, including shoulder: Secondary | ICD-10-CM | POA: Insufficient documentation

## 2013-05-18 DIAGNOSIS — C4499 Other specified malignant neoplasm of skin, unspecified: Secondary | ICD-10-CM

## 2013-05-18 DIAGNOSIS — Z510 Encounter for antineoplastic radiation therapy: Secondary | ICD-10-CM | POA: Insufficient documentation

## 2013-05-18 NOTE — Progress Notes (Signed)
  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  Identity was confirmed.  All relevant records and images related to the planned course of therapy were reviewed.  The patient freely provided informed written consent to proceed with treatment after reviewing the details related to the planned course of therapy. The consent form was witnessed and verified by the simulation staff.  Then, the patient was set-up in a stable reproducible  supine position for radiation therapy.  CT images were obtained.  Surface markings were placed.  The CT images were loaded into the planning software.  Then the target and avoidance structures were contoured.  Treatment planning then occurred.  The radiation prescription was entered and confirmed.  Then, I designed and supervised the construction of a total of 2 medically necessary complex treatment devices. These to devices included a body fix immobilization bag in order to immobilize her arm and hand for precise daily setup as well as a Cerrobend block to shape radiation around the left olecranon cutaneous lesion plus a 2 cm margin using an en face electron beam. I have requested : Special port plan to characterize the depth dose distribution of this radiation field.  PLAN:  The patient will receive 40 Gy in 10 fractions.  ________________________________  Artist Pais Kathrynn Running, M.D.

## 2013-05-29 ENCOUNTER — Ambulatory Visit
Admission: RE | Admit: 2013-05-29 | Discharge: 2013-05-29 | Disposition: A | Discharge status: Home / Self Care | Payer: Medicare Other | Source: Ambulatory Visit | Attending: Radiation Oncology | Admitting: Radiation Oncology

## 2013-05-29 DIAGNOSIS — C4499 Other specified malignant neoplasm of skin, unspecified: Secondary | ICD-10-CM

## 2013-05-30 ENCOUNTER — Ambulatory Visit
Admission: RE | Admit: 2013-05-30 | Discharge: 2013-05-30 | Disposition: A | Discharge status: Home / Self Care | Payer: Medicare Other | Source: Ambulatory Visit | Attending: Radiation Oncology | Admitting: Radiation Oncology

## 2013-05-31 ENCOUNTER — Ambulatory Visit
Admission: RE | Admit: 2013-05-31 | Discharge: 2013-05-31 | Disposition: A | Discharge status: Home / Self Care | Payer: Medicare Other | Source: Ambulatory Visit | Attending: Radiation Oncology | Admitting: Radiation Oncology

## 2013-06-01 ENCOUNTER — Ambulatory Visit
Admission: RE | Admit: 2013-06-01 | Discharge: 2013-06-01 | Disposition: A | Discharge status: Home / Self Care | Payer: Medicare Other | Source: Ambulatory Visit | Attending: Radiation Oncology | Admitting: Radiation Oncology

## 2013-06-01 ENCOUNTER — Ambulatory Visit: Admission: RE | Admit: 2013-06-01 | Payer: Medicare Other | Source: Ambulatory Visit

## 2013-06-01 ENCOUNTER — Encounter: Payer: Self-pay | Admitting: Radiation Oncology

## 2013-06-01 VITALS — BP 145/66 | HR 74 | Temp 97.6°F | Resp 16 | Wt 101.8 lb

## 2013-06-01 DIAGNOSIS — C4499 Other specified malignant neoplasm of skin, unspecified: Secondary | ICD-10-CM

## 2013-06-01 MED ORDER — RADIAPLEXRX EX GEL
Freq: Once | CUTANEOUS | Status: AC
  Administered 2013-06-01: 11:00:00 via TOPICAL

## 2013-06-01 NOTE — Progress Notes (Signed)
Swelling in LUE is stable, mostly forearm down. The nodule of porocarcinoma just below olecranon is uncomfortable and drains serosanguinous drainage persistently. Provided patient with radiaplex gel and directed upon use. Denies pain at this time. Denies fatigue.

## 2013-06-01 NOTE — Addendum Note (Signed)
Encounter addended by: Agnes Lawrence, RN on: 06/01/2013 10:44 AM<BR>     Documentation filed: Inpatient MAR, Orders

## 2013-06-01 NOTE — Progress Notes (Signed)
  Radiation Oncology         [phone] ________________________________  Name: Judy Herrera MRN: [id number]  Date: 06/01/2013  DOB: Aug 26, 1931  Weekly Radiation Therapy Management  Current Dose: Reviewed/Approved  Narrative . . . . . . . . The patient presents for routine under treatment assessment.                                                      The patient is without complaint during her first week of radiation. She has not noticed any changes as yet..                                   The chart was checked. Physical Findings. . .  weight is 101 lb 12.8 oz (46.176 kg). Her oral temperature is 97.6 F (36.4 C). Her blood pressure is 145/66 and her pulse is 74. Her respiration is 16 and oxygen saturation is 100%. . Weight essentially stable.  No significant changes. Her treated left arm appears unchanged. Impression . . . . . . . The patient is  tolerating radiation. Plan . . . . . . . . . . . . Continue treatment as planned.  ________________________________  Artist Pais. Kathrynn Running, M.D.

## 2013-06-02 ENCOUNTER — Ambulatory Visit
Admission: RE | Admit: 2013-06-02 | Discharge: 2013-06-02 | Disposition: A | Discharge status: Home / Self Care | Payer: Medicare Other | Source: Ambulatory Visit | Attending: Radiation Oncology | Admitting: Radiation Oncology

## 2013-06-05 ENCOUNTER — Ambulatory Visit
Admission: RE | Admit: 2013-06-05 | Discharge: 2013-06-05 | Disposition: A | Discharge status: Home / Self Care | Payer: Medicare Other | Source: Ambulatory Visit | Attending: Radiation Oncology | Admitting: Radiation Oncology

## 2013-06-06 ENCOUNTER — Ambulatory Visit
Admission: RE | Admit: 2013-06-06 | Discharge: 2013-06-06 | Disposition: A | Discharge status: Home / Self Care | Payer: Medicare Other | Source: Ambulatory Visit | Attending: Radiation Oncology | Admitting: Radiation Oncology

## 2013-06-07 ENCOUNTER — Ambulatory Visit
Admission: RE | Admit: 2013-06-07 | Discharge: 2013-06-07 | Disposition: A | Discharge status: Home / Self Care | Payer: Medicare Other | Source: Ambulatory Visit | Attending: Radiation Oncology | Admitting: Radiation Oncology

## 2013-06-08 ENCOUNTER — Encounter: Payer: Self-pay | Admitting: Radiation Oncology

## 2013-06-08 ENCOUNTER — Ambulatory Visit
Admission: RE | Admit: 2013-06-08 | Discharge: 2013-06-08 | Disposition: A | Discharge status: Home / Self Care | Payer: Medicare Other | Source: Ambulatory Visit | Attending: Radiation Oncology | Admitting: Radiation Oncology

## 2013-06-08 NOTE — Progress Notes (Signed)
  Radiation Oncology         [phone] ________________________________  Name: Judy Herrera MRN: [id number]  Date: 06/09/2013  DOB: 03/02/1931  Weekly Radiation Therapy Management  Current Dose: 36 Gy     Planned Dose:  40 Gy  Narrative . . . . . . . . The patient presents for routine under treatment assessment.                                                      The patient is without complaint.                                 Set-up films were reviewed.                                 The chart was checked. Physical Findings. . .  weight is 102 lb 6.4 oz (46.448 kg). Her blood pressure is 116/67 and her pulse is 89. Her respiration is 16. . Weight essentially stable.  No significant changes. Impression . . . . . . . The patient is  tolerating radiation. Plan . . . . . . . . . . . . Continue treatment as planned.  ________________________________  Artist Pais. Kathrynn Running, M.D.

## 2013-06-09 ENCOUNTER — Ambulatory Visit
Admission: RE | Admit: 2013-06-09 | Discharge: 2013-06-09 | Disposition: A | Discharge status: Home / Self Care | Payer: Medicare Other | Source: Ambulatory Visit | Attending: Radiation Oncology | Admitting: Radiation Oncology

## 2013-06-09 ENCOUNTER — Encounter: Payer: Self-pay | Admitting: Radiation Oncology

## 2013-06-09 VITALS — BP 116/67 | HR 89 | Resp 16 | Wt 102.4 lb

## 2013-06-09 DIAGNOSIS — C4499 Other specified malignant neoplasm of skin, unspecified: Secondary | ICD-10-CM

## 2013-06-09 NOTE — Progress Notes (Signed)
Swelling in LUE is stable, mostly forearm down. The nodule of porocarcinoma just below olecranon is uncomfortable and drains serosanguinous drainage persistently. Provided patient with radiaplex gel and directed upon use. No hyperpigmentation or desquamation of treatment area noted.  Denies pain at this time. Denies fatigue.

## 2013-06-12 ENCOUNTER — Ambulatory Visit
Admission: RE | Admit: 2013-06-12 | Discharge: 2013-06-12 | Disposition: A | Discharge status: Home / Self Care | Payer: Medicare Other | Source: Ambulatory Visit | Attending: Radiation Oncology | Admitting: Radiation Oncology

## 2013-06-12 ENCOUNTER — Encounter: Payer: Self-pay | Admitting: Radiation Oncology

## 2013-06-18 NOTE — Progress Notes (Signed)
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   Her nodule did not significantly change during radiation. No complications occurred.  Plan: The patient has completed radiation treatment. The patient will return to radiation oncology clinic for routine followup in one month. I am hopeful that her nodule will progressively shrink over the next month. ________________________________  Artist Pais. Kathrynn Running, M.D.

## 2013-06-24 NOTE — Progress Notes (Signed)
°  Radiation Oncology         [phone] °________________________________ ° °Name: Judy Herrera MRN: [id number]  °Date: 06/12/2013  DOB: 08/16/1931 ° °End of Treatment Note ° °Diagnosis:   79-year-old woman with porocarcinoma involving the left arm ° °Indication for treatment:  Palliation      ° °Radiation treatment dates:   05/29/2013-06/12/2013 ° °Site/dose:   The cutaneous tumor on the left olecranon process received 40 gray in 10 fractions of 4 gray ° °Beams/energy:   An en face electron beam was used with a thermoplastic immobilization device covering the nodule plus a 2 cm surrounding margin with 6 megaelectronvolt electrons prescribed according to a special port plan ° °Narrative: The patient tolerated radiation treatment relatively well.   Her nodule did not significantly change during radiation. No complications occurred. ° °Plan: The patient has completed radiation treatment. The patient will return to radiation oncology clinic for routine followup in one month. I am hopeful that her nodule will progressively shrink over the next month. °________________________________ ° °Payson Crumby A. Marquon Alcala, M.D. ° ° °

## 2013-06-26 ENCOUNTER — Ambulatory Visit (HOSPITAL_BASED_OUTPATIENT_CLINIC_OR_DEPARTMENT_OTHER): Payer: Medicare Other

## 2013-06-26 VITALS — BP 171/72 | HR 80 | Temp 97.6°F | Resp 18

## 2013-06-26 DIAGNOSIS — C439 Malignant melanoma of skin, unspecified: Secondary | ICD-10-CM

## 2013-06-26 DIAGNOSIS — Z452 Encounter for adjustment and management of vascular access device: Secondary | ICD-10-CM

## 2013-06-26 MED ORDER — HEPARIN SOD (PORK) LOCK FLUSH 100 UNIT/ML IV SOLN
500.0000 [IU] | Freq: Once | INTRAVENOUS | Status: AC
  Administered 2013-06-26: 500 [IU] via INTRAVENOUS
  Filled 2013-06-26: qty 5

## 2013-06-26 MED ORDER — SODIUM CHLORIDE 0.9 % IJ SOLN
10.0000 mL | INTRAMUSCULAR | Status: DC | PRN
  Administered 2013-06-26: 10 mL via INTRAVENOUS
  Filled 2013-06-26: qty 10

## 2013-06-26 NOTE — Patient Instructions (Addendum)

## 2013-07-19 ENCOUNTER — Encounter: Payer: Self-pay | Admitting: Internal Medicine

## 2013-07-19 ENCOUNTER — Ambulatory Visit (INDEPENDENT_AMBULATORY_CARE_PROVIDER_SITE_OTHER): Payer: Medicare Other | Admitting: Internal Medicine

## 2013-07-19 VITALS — BP 140/70 | HR 76 | Temp 98.2°F | Resp 20 | Wt 101.0 lb

## 2013-07-19 DIAGNOSIS — C4499 Other specified malignant neoplasm of skin, unspecified: Secondary | ICD-10-CM

## 2013-07-19 DIAGNOSIS — I10 Essential (primary) hypertension: Secondary | ICD-10-CM

## 2013-07-19 DIAGNOSIS — Z23 Encounter for immunization: Secondary | ICD-10-CM

## 2013-07-19 DIAGNOSIS — I428 Other cardiomyopathies: Secondary | ICD-10-CM

## 2013-07-19 DIAGNOSIS — E785 Hyperlipidemia, unspecified: Secondary | ICD-10-CM

## 2013-07-19 DIAGNOSIS — M810 Age-related osteoporosis without current pathological fracture: Secondary | ICD-10-CM

## 2013-07-19 MED ORDER — SIMVASTATIN 40 MG PO TABS: 40.0000 mg | ORAL_TABLET | Freq: Every day | ORAL | Status: DC

## 2013-07-19 MED ORDER — LISINOPRIL 20 MG PO TABS: 20.0000 mg | ORAL_TABLET | Freq: Every day | ORAL | Status: DC

## 2013-07-19 MED ORDER — TRAMADOL HCL 50 MG PO TABS: 50.0000 mg | ORAL_TABLET | Freq: Four times a day (QID) | ORAL | Status: DC | PRN

## 2013-07-19 MED ORDER — AMLODIPINE BESYLATE 2.5 MG PO TABS: ORAL_TABLET | ORAL | Status: DC

## 2013-07-19 NOTE — Progress Notes (Signed)
Subjective:    Patient ID: Judy Herrera, female    DOB: [birth year], 79 y.o.   MRN: [id number]  HPI  79 year old patient who is seen today for her biannual followup. She has a history of hypertension dyslipidemia coronary artery disease. She has osteoporosis and is receiving Prolia.  She is followed closely by oncology. Since her last visit here she has also been seen by cardiology due 2 nonobstructive coronary artery disease and cardiomyopathy. Her cardiac status has been quite stable. No concerns or complaints today.  Past Medical History  Diagnosis Date  . Hypertension   . Allergy   . Cancer   . Ovarian ca   . Cataract   . Breast CA   . CAD (coronary artery disease)   . Hyperlipidemia   . Cardiomyopathy     improved  . Arthritis   . Osteoporosis   . History of radiation therapy 1994    left chest wall  . History of chemotherapy     History   Social History  . Marital Status: Widowed    Spouse Name: N/A    Number of Children: N/A  . Years of Education: N/A   Occupational History  . Not on file.   Social History Main Topics  . Smoking status: Never Smoker   . Smokeless tobacco: Never Used  . Alcohol Use: No  . Drug Use: No  . Sexual Activity: Not on file     Comment: didn't ask   Other Topics Concern  . Not on file   Social History Narrative  . No narrative on file    Past Surgical History  Procedure Laterality Date  . Left breast mastectomy  1994    T3 N1 Mx  . Shoulder arthroscopy    . Right leg    . Shoulder surgery      replacement  . Modified radical mastectomy w/ axillary lymph node dissection w/ pectoralis minor excision  1994    Left  . Eye surgery      CATARACTS  . Abdominal hysterectomy  2004  . Cholecystectomy  1993  . Breast surgery    . Skin biopsy  04/27/2011    left hand, forearm and posterior arm    Family History  Problem Relation Age of Onset  . Breast cancer Sister   . Cancer Sister   . Heart disease Sister   . Heart  disease Father   . Heart disease Mother     Allergies  Allergen Reactions  . Ibuprofen Nausea Only  . Morphine Nausea And Vomiting    Current Outpatient Prescriptions on File Prior to Visit  Medication Sig Dispense Refill  . acyclovir (ZOVIRAX) 400 MG tablet Take 400 mg by mouth 3 (three) times daily as needed (for fever blisters.).      Marland Kitchen aspirin EC 81 MG tablet Take 81 mg by mouth at bedtime.       . Calcium Carbonate-Vitamin D (CALCIUM + D PO) Take 600 mg by mouth daily after lunch.       . carvedilol (COREG) 25 MG tablet Take 1 tablet (25 mg total) by mouth 2 (two) times daily with a meal.  180 tablet  5  . Cholecalciferol (VITAMIN D) 2000 UNITS tablet Take 2,000 Units by mouth daily.      . Coenzyme Q10 (CO Q 10) 100 MG CAPS Take 1 capsule by mouth daily.      Marland Kitchen denosumab (PROLIA) 60 MG/ML SOLN injection Inject 60 mg into the  skin every 6 (six) months. Administer in upper arm, thigh, or abdomen      . ferrous gluconate (FERGON) 324 MG tablet Take 324 mg by mouth 2 (two) times a week. Takes 2 tabs on empty stomach with OJ on Monday and Friday.      Marland Kitchen HYDROcodone-acetaminophen (NORCO/VICODIN) 5-325 MG per tablet Take 1 tablet by mouth every 6 (six) hours as needed for pain.      Marland Kitchen loperamide (IMODIUM) 2 MG capsule Take 1 capsule (2 mg total) by mouth as needed for diarrhea or loose stools.  20 capsule  0   No current facility-administered medications on file prior to visit.    BP 140/70  Pulse 76  Temp(Src) 98.2 F (36.8 C) (Oral)  Resp 20  Wt 101 lb (45.813 kg)  BMI 18.47 kg/m2  SpO2 96%       Review of Systems  Constitutional: Negative.   HENT: Negative for hearing loss, congestion, sore throat, rhinorrhea, dental problem, sinus pressure and tinnitus.   Eyes: Negative for pain, discharge and visual disturbance.  Respiratory: Negative for cough and shortness of breath.   Cardiovascular: Negative for chest pain, palpitations and leg swelling.  Gastrointestinal:  Negative for nausea, vomiting, abdominal pain, diarrhea, constipation, blood in stool and abdominal distention.  Genitourinary: Negative for dysuria, urgency, frequency, hematuria, flank pain, vaginal bleeding, vaginal discharge, difficulty urinating, vaginal pain and pelvic pain.  Musculoskeletal: Negative for joint swelling, arthralgias and gait problem.  Skin: Negative for rash.  Neurological: Negative for dizziness, syncope, speech difficulty, weakness, numbness and headaches.  Hematological: Negative for adenopathy.  Psychiatric/Behavioral: Negative for behavioral problems, dysphoric mood and agitation. The patient is not nervous/anxious.        Objective:   Physical Exam  Constitutional: She is oriented to person, place, and time. She appears well-developed and well-nourished.  HENT:  Head: Normocephalic.  Right Ear: External ear normal.  Left Ear: External ear normal.  Mouth/Throat: Oropharynx is clear and moist.  Eyes: Conjunctivae and EOM are normal. Pupils are equal, round, and reactive to light.  Neck: Normal range of motion. Neck supple. No thyromegaly present.  Cardiovascular: Normal rate, regular rhythm, normal heart sounds and intact distal pulses.   Pulmonary/Chest: Effort normal and breath sounds normal.  Abdominal: Soft. Bowel sounds are normal. She exhibits no mass. There is no tenderness.  Musculoskeletal: Normal range of motion.  Lymphadenopathy:    She has no cervical adenopathy.  Neurological: She is alert and oriented to person, place, and time.  Skin: Skin is warm and dry. No rash noted.  Multiple subcutaneous nodules involving the left upper and lower arm Multiple scars from excisional biopsies  Psychiatric: She has a normal mood and affect. Her behavior is normal.          Assessment & Plan:   Hypertension controlled Dyslipidemia CAD stable Osteoporosis  Followup oncology Recheck 6 months or as needed  medications updated

## 2013-07-19 NOTE — Patient Instructions (Signed)
Limit your sodium (Salt) intake    It is important that you exercise regularly, at least 20 minutes 3 to 4 times per week.  If you develop chest pain or shortness of breath seek  medical attention.  Return in 6 months for follow-up  

## 2013-07-20 ENCOUNTER — Ambulatory Visit
Admission: RE | Admit: 2013-07-20 | Discharge: 2013-07-20 | Disposition: A | Discharge status: Home / Self Care | Payer: Medicare Other | Source: Ambulatory Visit | Attending: Radiation Oncology | Admitting: Radiation Oncology

## 2013-07-20 ENCOUNTER — Encounter: Payer: Self-pay | Admitting: Radiation Oncology

## 2013-07-20 VITALS — BP 153/66 | HR 77 | Temp 97.4°F | Resp 16 | Wt 101.0 lb

## 2013-07-20 DIAGNOSIS — C4499 Other specified malignant neoplasm of skin, unspecified: Secondary | ICD-10-CM

## 2013-07-20 NOTE — Progress Notes (Signed)
Swelling in LUE is stable, mostly forearm down. The nodule of porocarcinoma just below olecranon has diminished leaving behind a hard flat palpable mass. Skin of old olecranon site with hyperpigmentation and dry desquamation. Patient reports that she continues to use radiaplex on said area. The is no longer a bleeding raised mass and patient is pleased. However, a new olecranon mass that does not drain is not 4 inches distal to old site. Denies pain at this time. Denies fatigue.

## 2013-07-20 NOTE — Progress Notes (Signed)
Radiation Oncology         [phone] ________________________________  Name: Judy Herrera MRN: [id number]  Date: 07/20/2013  DOB: May 11, 1931  Follow-Up Visit Note  CC: Rogelia Boga, MD  Gordy Savers, MD  Diagnosis:   79 year old woman with porocarcinoma involving the left arm s/p palliative radiotherapy for one larger oozing/bleeding lesion  on the left olecranon process 05/29/2013-06/12/2013 which received 40 gray in 10 fractions of 4 gray  Interval Since Last Radiation:  4  weeks  Narrative:  The patient returns today for routine follow-up.  She had some erythema and dry desquamation after radiation, but, this has resolved.  Her treated nodule has also regressed.                              ALLERGIES:  is allergic to ibuprofen and morphine.  Meds: Current Outpatient Prescriptions  Medication Sig Dispense Refill  . amLODipine (NORVASC) 2.5 MG tablet TAKE 1 TABLET DAILY.  90 tablet  3  . aspirin EC 81 MG tablet Take 81 mg by mouth at bedtime.       . Calcium Carbonate-Vitamin D (CALCIUM + D PO) Take 600 mg by mouth daily after lunch.       . carvedilol (COREG) 25 MG tablet Take 1 tablet (25 mg total) by mouth 2 (two) times daily with a meal.  180 tablet  5  . Cholecalciferol (VITAMIN D) 2000 UNITS tablet Take 2,000 Units by mouth daily.      . Coenzyme Q10 (CO Q 10) 100 MG CAPS Take 1 capsule by mouth daily.      Marland Kitchen denosumab (PROLIA) 60 MG/ML SOLN injection Inject 60 mg into the skin every 6 (six) months. Administer in upper arm, thigh, or abdomen      . ferrous gluconate (FERGON) 324 MG tablet Take 324 mg by mouth 2 (two) times a week. Takes 2 tabs on empty stomach with OJ on Monday and Friday.      Marland Kitchen lisinopril (PRINIVIL,ZESTRIL) 20 MG tablet Take 1 tablet (20 mg total) by mouth at bedtime.  90 tablet  3  . simvastatin (ZOCOR) 40 MG tablet Take 1 tablet (40 mg total) by mouth at bedtime.  90 tablet  3  . traMADol (ULTRAM) 50 MG tablet Take 1 tablet (50 mg  total) by mouth every 6 (six) hours as needed for pain.  60 tablet  4  . acyclovir (ZOVIRAX) 400 MG tablet Take 400 mg by mouth 3 (three) times daily as needed (for fever blisters.).      Marland Kitchen HYDROcodone-acetaminophen (NORCO/VICODIN) 5-325 MG per tablet Take 1 tablet by mouth every 6 (six) hours as needed for pain.      Marland Kitchen loperamide (IMODIUM) 2 MG capsule Take 1 capsule (2 mg total) by mouth as needed for diarrhea or loose stools.  20 capsule  0   No current facility-administered medications for this encounter.    Physical Findings: The patient is in no acute distress. Patient is alert and oriented.  weight is 101 lb (45.813 kg). Her oral temperature is 97.4 F (36.3 C). Her blood pressure is 153/66 and her pulse is 77. Her respiration is 16 and oxygen saturation is 100%. .The treated nodule has resolved and there are some dry desq hyperpigmentation changes.  No significant changes.  Impression:  The patient is recovering from the effects of radiation with a good result from radiotherapy.  Plan:  She plans to  continue follow-up with med-onc (hopefully Dr. Darrold Span), and I will see her back prn.  _____________________________________  Artist Pais. Kathrynn Running, M.D.

## 2013-07-21 ENCOUNTER — Other Ambulatory Visit: Payer: Self-pay | Admitting: Internal Medicine

## 2013-08-08 ENCOUNTER — Telehealth: Payer: Self-pay | Admitting: *Deleted

## 2013-08-08 NOTE — Telephone Encounter (Signed)
Patient called asking for [facility].  Will call back tomorrow.  Reports she was instructed to call 2 weeks before scheduled f/u.  Scheduled to come in 08-22-2013 for lab at 9:30/f/u at 10:00 with provider #1.  Ms. Goforth can be reached at [phone].

## 2013-08-10 ENCOUNTER — Telehealth: Payer: Self-pay

## 2013-08-10 NOTE — Telephone Encounter (Signed)
Rescheduled Ms. Phang from Dr. Rosie Fate to Dr. Darrold Span for 08-23-13.  Pt. Verbalized understanding of appointments.

## 2013-08-22 ENCOUNTER — Ambulatory Visit: Payer: Medicare Other

## 2013-08-22 ENCOUNTER — Other Ambulatory Visit: Payer: Medicare Other | Admitting: Lab

## 2013-08-23 ENCOUNTER — Encounter (INDEPENDENT_AMBULATORY_CARE_PROVIDER_SITE_OTHER): Payer: Self-pay

## 2013-08-23 ENCOUNTER — Ambulatory Visit: Payer: Medicare Other

## 2013-08-23 ENCOUNTER — Other Ambulatory Visit (HOSPITAL_BASED_OUTPATIENT_CLINIC_OR_DEPARTMENT_OTHER): Payer: Medicare Other | Admitting: Lab

## 2013-08-23 ENCOUNTER — Ambulatory Visit (HOSPITAL_BASED_OUTPATIENT_CLINIC_OR_DEPARTMENT_OTHER): Payer: Medicare Other | Admitting: Oncology

## 2013-08-23 ENCOUNTER — Ambulatory Visit (HOSPITAL_BASED_OUTPATIENT_CLINIC_OR_DEPARTMENT_OTHER): Payer: Medicare Other

## 2013-08-23 ENCOUNTER — Encounter: Payer: Self-pay | Admitting: Oncology

## 2013-08-23 ENCOUNTER — Telehealth: Payer: Self-pay | Admitting: *Deleted

## 2013-08-23 VITALS — BP 164/71 | HR 73 | Temp 97.5°F | Resp 18 | Ht 62.0 in | Wt 101.1 lb

## 2013-08-23 DIAGNOSIS — C439 Malignant melanoma of skin, unspecified: Secondary | ICD-10-CM

## 2013-08-23 DIAGNOSIS — I890 Lymphedema, not elsewhere classified: Secondary | ICD-10-CM

## 2013-08-23 DIAGNOSIS — Z8543 Personal history of malignant neoplasm of ovary: Secondary | ICD-10-CM

## 2013-08-23 DIAGNOSIS — M810 Age-related osteoporosis without current pathological fracture: Secondary | ICD-10-CM

## 2013-08-23 DIAGNOSIS — C4499 Other specified malignant neoplasm of skin, unspecified: Secondary | ICD-10-CM

## 2013-08-23 DIAGNOSIS — Z23 Encounter for immunization: Secondary | ICD-10-CM

## 2013-08-23 LAB — CBC WITH DIFFERENTIAL/PLATELET
BASO%: 1 % (ref 0.0–2.0)
LYMPH%: 29.1 % (ref 14.0–49.7)
MCHC: 34.7 g/dL (ref 31.5–36.0)
MCV: 92.9 fL (ref 79.5–101.0)
MONO#: 0.5 10*3/uL (ref 0.1–0.9)
MONO%: 11.4 % (ref 0.0–14.0)
NEUT#: 2.1 10*3/uL (ref 1.5–6.5)
Platelets: 162 10*3/uL (ref 145–400)
RBC: 3.35 10*6/uL — ABNORMAL LOW (ref 3.70–5.45)
RDW: 13 % (ref 11.2–14.5)
WBC: 4 10*3/uL (ref 3.9–10.3)

## 2013-08-23 LAB — COMPREHENSIVE METABOLIC PANEL (CC13)
ALT: 12 U/L (ref 0–55)
Albumin: 3.4 g/dL — ABNORMAL LOW (ref 3.5–5.0)
Alkaline Phosphatase: 42 U/L (ref 40–150)
Potassium: 4.2 mEq/L (ref 3.5–5.1)
Sodium: 137 mEq/L (ref 136–145)
Total Bilirubin: 1.33 mg/dL — ABNORMAL HIGH (ref 0.20–1.20)
Total Protein: 6.1 g/dL — ABNORMAL LOW (ref 6.4–8.3)

## 2013-08-23 MED ORDER — INFLUENZA VAC SPLIT QUAD 0.5 ML IM SUSP
0.5000 mL | Freq: Once | INTRAMUSCULAR | Status: AC
  Administered 2013-08-23: 0.5 mL via INTRAMUSCULAR
  Filled 2013-08-23: qty 0.5

## 2013-08-23 MED ORDER — HEPARIN SOD (PORK) LOCK FLUSH 100 UNIT/ML IV SOLN
500.0000 [IU] | Freq: Once | INTRAVENOUS | Status: AC
Start: 2013-08-23 — End: 2013-08-23
  Administered 2013-08-23: 500 [IU] via INTRAVENOUS
  Filled 2013-08-23: qty 5

## 2013-08-23 MED ORDER — SODIUM CHLORIDE 0.9 % IJ SOLN
10.0000 mL | INTRAMUSCULAR | Status: DC | PRN
  Administered 2013-08-23: 10 mL via INTRAVENOUS
  Filled 2013-08-23: qty 10

## 2013-08-23 NOTE — Progress Notes (Signed)
OFFICE PROGRESS NOTE   08/23/2013   Physicians:P.Kwaitkowski, M.Manning, P.Drusilla Kanner, F.Lupton, B.Crenshaw, M.Martin, [facility], D.ClarkePearson.    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 Since I saw her last, she had 40 Gy in 10 fractions in July 2014 to symptomatic in transit nodule at left olecranon, with excellent improvement at that spot. Prior to RT that area was tender and bled with contact. Last CT CAP was in June 2014.   Past history is also significant for remote node positive left breast cancer with secondary lymphedema LUE, and IA ovarian cancer in 2004. She had right mammogram (not tomo) at [facility] 05-12-13, yearly follow up recommended; she does have very dense breast tissue and should consider tomo imaging next. She has PAC in, flushed today.   Patient denies any significant discomfort from the areas of in transit mets LUE, and has had no bleeding from the remaining areas. Lymphedema LUE was not noticeably worse to her with recent RT. No new or different pain. No changes right breast. No lower respiratory symptoms. Appetite better. Energy at baseline. No swelling LE. No abdominal or pelvic pain.   ONCOLOGIC HISTORY Patient was diagnosed with porocarcinoma involving 3 areas left hand and arm in spring 2012 by Dr.Lupton. She had re-excisions of all areas (dorsum left hand, 2 on left forearm and left arm) by Dr Herbie Saxon. She had significant lymphedema LUE after those procedures, with history of left breast cancer with 20 node left axillary dissection in 1994 followed by radiation. Repeat scans at [facility] in 05-2012 reportedly had no other distant disease. Because of progressive in transit mets, she had hyperthermic limb infusion by Dr Lenis Noon at [facility] in 06-2012 (chemotherapeutic agent not in notes available to me); she also had excision of an area on dorsum of left hand in Oct 2013. She had  significant swelling of LUE after the limb perfusion, which gradually improved.She had progression of the in transit mets LUE after the limb perfusion, which were not symptomatic. She was seen in consultation by Dr Orvilla Fus at [facility], with recommendation for gemzar/taxotere as systemic sarcoma regimen, chosen in part due to her prior chemotherapy regimens. [facility] notes report no evidence of disease on CXR there on 11-23-2012. She had a single cycle of gemcitabine taxotere at [facility] in Feb 2014, tolerated very poorly even with dose reduction, with hospitalization due to pancytopenia despite neulasta, requiring transfusions of PRBCs and platelets, as well as severe diarrhea with dehydration and electrolyte abnormalities, and related decrease in nutritional status and general debility; it has taken several months to improve PS back to baseline since that attempt at chemotherapy.  She additionally had ovarian cancer IA clear cell in 2004, treated after surgery with 3 cycles of taxol/ carboplatin. She is to see Dr Yolande Jolly 05-02-13.  Her left breast cancer was T3N1 (2 of 20 nodes) ER+ in 1994, treated with mastectomy and axillary nodes, adriamycin/cytoxan x4, RT, and tamoxifen x 5 years. Last mammograms were at [facility] 05-12-12; she would like to have mammograms done now in [facility] facility in [facility]   Review of systems as above, also: No other bleeding. No fever or sweats. No problems with PAC. Remainder of 10 point Review of Systems negative.  Objective:  Vital signs in last 24 hours:  BP 164/71  Pulse 73  Temp(Src) 97.5 F (36.4 C) (Oral)  Resp 18  Ht 5\' 2"  (1.575 m)  Wt 101  lb 1.6 oz (45.859 kg)  BMI 18.49 kg/m2 weight is down 2 lbs.   Alert, oriented and appropriate. Ambulatory without assistance. Looks frail but comfortable, very pleasant as always.   HEENT:PERRL, sclerae not icteric. Oral mucosa moist without lesions, posterior pharynx clear.  Neck supple. No JVD.   Lymphatics:no cervical,suraclavicular, axillary or inguinal adenopathy Resp: clear to auscultation bilaterally and normal percussion bilaterally Cardio: regular rate and rhythm. No gallop. GI: soft, nontender, not distended, no mass or organomegaly. Normally active bowel sounds. Surgical incision not remarkable. Musculoskeletal/ Extremities: without pitting edema, cords, tenderness RUE and bilateral LE. LUE has 1-2+ edema most noticeable around elbow, no erythema or heat. The irradiated skin nodule has completely regressed, with no tenderness there. Largest of the multiple other nodules now is just superior to olecranon posteriorly, ~ 1.5 cm diameter and raised ~ 1 cm, no bleeding or breakdown. She has multiple other scattered lesions mostly on forearm, up to 0.5 - 0.8 cm diameter. Neuro: no peripheral neuropathy. Otherwise nonfocal Skin without rash, ecchymosis, petechiae Breasts: Right without dominant mass, skin or nipple findings. Left mastectomy scar without evidence of local recurrence.Axillae benign. Portacath-without erythema or tenderness, flushed without difficulty today  Lab Results:  Results for orders placed in visit on 08/23/13  CBC WITH DIFFERENTIAL      Result Value Range   WBC 4.0  3.9 - 10.3 10e3/uL   NEUT# 2.1  1.5 - 6.5 10e3/uL   HGB 10.8 (*) 11.6 - 15.9 g/dL   HCT 16.1 (*) 09.6 - 04.5 %   Platelets 162  145 - 400 10e3/uL   MCV 92.9  79.5 - 101.0 fL   MCH 32.2  25.1 - 34.0 pg   MCHC 34.7  31.5 - 36.0 g/dL   RBC 4.09 (*) 8.11 - 9.14 10e6/uL   RDW 13.0  11.2 - 14.5 %   lymph# 1.2  0.9 - 3.3 10e3/uL   MONO# 0.5  0.1 - 0.9 10e3/uL   Eosinophils Absolute 0.2  0.0 - 0.5 10e3/uL   Basophils Absolute 0.0  0.0 - 0.1 10e3/uL   NEUT% 53.2  38.4 - 76.8 %   LYMPH% 29.1  14.0 - 49.7 %   MONO% 11.4  0.0 - 14.0 %   EOS% 5.3  0.0 - 7.0 %   BASO% 1.0  0.0 - 2.0 %  COMPREHENSIVE METABOLIC PANEL (CC13)      Result Value Range   Sodium 137  136 - 145 mEq/L   Potassium 4.2  3.5 -  5.1 mEq/L   Chloride 102  98 - 109 mEq/L   CO2 29  22 - 29 mEq/L   Glucose 89  70 - 140 mg/dl   BUN 78.2  7.0 - 95.6 mg/dL   Creatinine 0.9  0.6 - 1.1 mg/dL   Total Bilirubin 2.13 (*) 0.20 - 1.20 mg/dL   Alkaline Phosphatase 42  40 - 150 U/L   AST 15  5 - 34 U/L   ALT 12  0 - 55 U/L   Total Protein 6.1 (*) 6.4 - 8.3 g/dL   Albumin 3.4 (*) 3.5 - 5.0 g/dL   Calcium 8.8  8.4 - 08.6 mg/dL   Anion Gap 7  3 - 11 mEq/L     Studies/Results: DIGITAL SCREENING UNILATERAL RIGHT MAMMOGRAM WITH CAD   [facility] Comparison: Previous exams.  FINDINGS:  ACR Breast Density Category d: The breasts are extremely dense,  which lowers the sensitivity of mammography.  There are no findings suspicious  for malignancy. Right sided Port-A-  Cath in place.  Images were processed with CAD.  IMPRESSION:  No mammographic evidence of malignancy.  A result letter of this screening mammogram will be mailed directly  to the patient.  RECOMMENDATION:  Screening mammogram in one year.   CT CAP 05-02-13 as noted.   Medications: I have reviewed the patient's current medications. Flu vaccine given today  DISCUSSION: Patient is still most comfortable being followed with observation only, and overall seems to be doing the best now that she has in at least a year. She is very pleased with improvement in the symptomatic skin nodule from RT. I will see her back when PAC is flushed in 2 mo and 4 months.  Assessment/Plan: 1.Porocarcinoma LUE with in transit mets: post several excisions, hyperbaric limb perfusion at [facility], one cycle of gemzar taxotere Feb 2014 with partial response of the lesions but tolerated extremely poorly such that we have followed with observation since then. Post directed RT to single area LUE as above. Follow up 2 mo or sooner if needed 2.Node positive left breast cancer not known recurrent: as above 3.LUE lymphedema 4.IA ovarian cancer 2004 5.flu vaccine given  today 6.cardiomyopathy 2008 7.osteoporosis with vertebral compression fracture 8.PAC in since attempt at chemotherapy in early 2014. Peripheral IV access is extremely poor such that this has been maintained.   Patient is in agreement with plan and has had questions answered to her satisfaction.    Lonie Newsham P, MD   08/23/2013, 1:57 PM

## 2013-08-23 NOTE — Patient Instructions (Signed)
Implanted Port Instructions  An implanted port is a central line that has a round shape and is placed under the skin. It is used for long-term IV (intravenous) access for:  · Medicine.  · Fluids.  · Liquid nutrition, such as TPN (total parenteral nutrition).  · Blood samples.  Ports can be placed:  · In the chest area just below the collarbone (this is the most common place.)  · In the arms.  · In the belly (abdomen) area.  · In the legs.  PARTS OF THE PORT  A port has 2 main parts:  · The reservoir. The reservoir is round, disc-shaped, and will be a small, raised area under your skin.  · The reservoir is the part where a needle is inserted (accessed) to either give medicines or to draw blood.  · The catheter. The catheter is a long, slender tube that extends from the reservoir. The catheter is placed into a large vein.  · Medicine that is inserted into the reservoir goes into the catheter and then into the vein.  INSERTION OF THE PORT  · The port is surgically placed in either an operating room or in a procedural area (interventional radiology).  · Medicine may be given to help you relax during the procedure.  · The skin where the port will be inserted is numbed (local anesthetic).  · 1 or 2 small cuts (incisions) will be made in the skin to insert the port.  · The port can be used after it has been inserted.  INCISION SITE CARE  · The incision site may have small adhesive strips on it. This helps keep the incision site closed. Sometimes, no adhesive strips are placed. Instead of adhesive strips, a special kind of surgical glue is used to keep the incision closed.  · If adhesive strips were placed on the incision sites, do not take them off. They will fall off on their own.  · The incision site may be sore for 1 to 2 days. Pain medicine can help.  · Do not get the incision site wet. Bathe or shower as directed by your caregiver.  · The incision site should heal in 5 to 7 days. A small scar may form after the  incision has healed.  ACCESSING THE PORT  Special steps must be taken to access the port:  · Before the port is accessed, a numbing cream can be placed on the skin. This helps numb the skin over the port site.  · A sterile technique is used to access the port.  · The port is accessed with a needle. Only "non-coring" port needles should be used to access the port. Once the port is accessed, a blood return should be checked. This helps ensure the port is in the vein and is not clogged (clotted).  · If your caregiver believes your port should remain accessed, a clear (transparent) bandage will be placed over the needle site. The bandage and needle will need to be changed every week or as directed by your caregiver.  · Keep the bandage covering the needle clean and dry. Do not get it wet. Follow your caregiver's instructions on how to take a shower or bath when the port is accessed.  · If your port does not need to stay accessed, no bandage is needed over the port.  FLUSHING THE PORT  Flushing the port keeps it from getting clogged. How often the port is flushed depends on:  · If a   constant infusion is running. If a constant infusion is running, the port may not need to be flushed.  · If intermittent medicines are given.  · If the port is not being used.  For intermittent medicines:  · The port will need to be flushed:  · After medicines have been given.  · After blood has been drawn.  · As part of routine maintenance.  · A port is normally flushed with:  · Normal saline.  · Heparin.  · Follow your caregiver's advice on how often, how much, and the type of flush to use on your port.  IMPORTANT PORT INFORMATION  · Tell your caregiver if you are allergic to heparin.  · After your port is placed, you will get a manufacturer's information card. The card has information about your port. Keep this card with you at all times.  · There are many types of ports available. Know what kind of port you have.  · In case of an  emergency, it may be helpful to wear a medical alert bracelet. This can help alert health care workers that you have a port.  · The port can stay in for as long as your caregiver believes it is necessary.  · When it is time for the port to come out, surgery will be done to remove it. The surgery will be similar to how the port was put in.  · If you are in the hospital or clinic:  · Your port will be taken care of and flushed by a nurse.  · If you are at home:  · A home health care nurse may give medicines and take care of the port.  · You or a family member can get special training and directions for giving medicine and taking care of the port at home.  SEEK IMMEDIATE MEDICAL CARE IF:   · Your port does not flush or you are unable to get a blood return.  · New drainage or pus is coming from the incision.  · A bad smell is coming from the incision site.  · You develop swelling or increased redness at the incision site.  · You develop increased swelling or pain at the port site.  · You develop swelling or pain in the surrounding skin near the port.  · You have an oral temperature above 102° F (38.9° C), not controlled by medicine.  MAKE SURE YOU:   · Understand these instructions.  · Will watch your condition.  · Will get help right away if you are not doing well or get worse.  Document Released: 11/02/2005 Document Revised: 01/25/2012 Document Reviewed: 01/24/2009  [employer]® Patient Information ©2014 [employer].

## 2013-08-23 NOTE — Telephone Encounter (Signed)
appts made and printed...td 

## 2013-09-20 ENCOUNTER — Other Ambulatory Visit: Payer: Self-pay | Admitting: *Deleted

## 2013-09-20 ENCOUNTER — Other Ambulatory Visit: Payer: Self-pay | Admitting: Oncology

## 2013-09-20 MED ORDER — ACYCLOVIR 400 MG PO TABS: 200.0000 mg | ORAL_TABLET | Freq: Three times a day (TID) | ORAL | Status: DC | PRN

## 2013-09-20 NOTE — Telephone Encounter (Signed)
Sent in basket message to Dr. Amador Cunas (PCP) requesting his office take over her acyclovir script refills. No longer under active tx at [facility]

## 2013-10-18 ENCOUNTER — Encounter: Payer: Self-pay | Admitting: Oncology

## 2013-10-18 ENCOUNTER — Other Ambulatory Visit (HOSPITAL_BASED_OUTPATIENT_CLINIC_OR_DEPARTMENT_OTHER): Payer: Medicare Other | Admitting: Lab

## 2013-10-18 ENCOUNTER — Ambulatory Visit (HOSPITAL_BASED_OUTPATIENT_CLINIC_OR_DEPARTMENT_OTHER): Payer: Medicare Other | Admitting: Oncology

## 2013-10-18 ENCOUNTER — Ambulatory Visit (HOSPITAL_BASED_OUTPATIENT_CLINIC_OR_DEPARTMENT_OTHER): Payer: Medicare Other

## 2013-10-18 ENCOUNTER — Telehealth: Payer: Self-pay | Admitting: Oncology

## 2013-10-18 VITALS — BP 145/63 | HR 78 | Temp 98.8°F | Resp 17

## 2013-10-18 VITALS — BP 130/67 | HR 81 | Temp 98.2°F | Resp 20 | Ht 62.0 in | Wt 101.5 lb

## 2013-10-18 DIAGNOSIS — Z452 Encounter for adjustment and management of vascular access device: Secondary | ICD-10-CM

## 2013-10-18 DIAGNOSIS — C4499 Other specified malignant neoplasm of skin, unspecified: Secondary | ICD-10-CM

## 2013-10-18 DIAGNOSIS — C44691 Other specified malignant neoplasm of skin of unspecified upper limb, including shoulder: Secondary | ICD-10-CM

## 2013-10-18 DIAGNOSIS — M810 Age-related osteoporosis without current pathological fracture: Secondary | ICD-10-CM

## 2013-10-18 DIAGNOSIS — Z853 Personal history of malignant neoplasm of breast: Secondary | ICD-10-CM

## 2013-10-18 DIAGNOSIS — Z8543 Personal history of malignant neoplasm of ovary: Secondary | ICD-10-CM

## 2013-10-18 LAB — CBC WITH DIFFERENTIAL/PLATELET
BASO%: 0.7 % (ref 0.0–2.0)
EOS%: 3 % (ref 0.0–7.0)
HCT: 32.9 % — ABNORMAL LOW (ref 34.8–46.6)
LYMPH%: 16.4 % (ref 14.0–49.7)
MCH: 32.2 pg (ref 25.1–34.0)
MCHC: 33.5 g/dL (ref 31.5–36.0)
MCV: 96.1 fL (ref 79.5–101.0)
MONO%: 8.9 % (ref 0.0–14.0)
NEUT%: 71 % (ref 38.4–76.8)
Platelets: 129 10*3/uL — ABNORMAL LOW (ref 145–400)

## 2013-10-18 LAB — COMPREHENSIVE METABOLIC PANEL (CC13)
ALT: 14 U/L (ref 0–55)
AST: 17 U/L (ref 5–34)
Alkaline Phosphatase: 47 U/L (ref 40–150)
Anion Gap: 9 mEq/L (ref 3–11)
CO2: 28 mEq/L (ref 22–29)
Creatinine: 0.9 mg/dL (ref 0.6–1.1)
Total Bilirubin: 1.42 mg/dL — ABNORMAL HIGH (ref 0.20–1.20)

## 2013-10-18 MED ORDER — HEPARIN SOD (PORK) LOCK FLUSH 100 UNIT/ML IV SOLN
500.0000 [IU] | Freq: Once | INTRAVENOUS | Status: AC
  Administered 2013-10-18: 500 [IU] via INTRAVENOUS
  Filled 2013-10-18: qty 5

## 2013-10-18 MED ORDER — SODIUM CHLORIDE 0.9 % IJ SOLN
10.0000 mL | INTRAMUSCULAR | Status: DC | PRN
  Administered 2013-10-18: 10 mL via INTRAVENOUS
  Filled 2013-10-18: qty 10

## 2013-10-18 NOTE — Telephone Encounter (Signed)
, °

## 2013-10-18 NOTE — Patient Instructions (Signed)
Implanted Port Instructions  An implanted port is a central line that has a round shape and is placed under the skin. It is used for long-term IV (intravenous) access for:  · Medicine.  · Fluids.  · Liquid nutrition, such as TPN (total parenteral nutrition).  · Blood samples.  Ports can be placed:  · In the chest area just below the collarbone (this is the most common place.)  · In the arms.  · In the belly (abdomen) area.  · In the legs.  PARTS OF THE PORT  A port has 2 main parts:  · The reservoir. The reservoir is round, disc-shaped, and will be a small, raised area under your skin.  · The reservoir is the part where a needle is inserted (accessed) to either give medicines or to draw blood.  · The catheter. The catheter is a long, slender tube that extends from the reservoir. The catheter is placed into a large vein.  · Medicine that is inserted into the reservoir goes into the catheter and then into the vein.  INSERTION OF THE PORT  · The port is surgically placed in either an operating room or in a procedural area (interventional radiology).  · Medicine may be given to help you relax during the procedure.  · The skin where the port will be inserted is numbed (local anesthetic).  · 1 or 2 small cuts (incisions) will be made in the skin to insert the port.  · The port can be used after it has been inserted.  INCISION SITE CARE  · The incision site may have small adhesive strips on it. This helps keep the incision site closed. Sometimes, no adhesive strips are placed. Instead of adhesive strips, a special kind of surgical glue is used to keep the incision closed.  · If adhesive strips were placed on the incision sites, do not take them off. They will fall off on their own.  · The incision site may be sore for 1 to 2 days. Pain medicine can help.  · Do not get the incision site wet. Bathe or shower as directed by your caregiver.  · The incision site should heal in 5 to 7 days. A small scar may form after the  incision has healed.  ACCESSING THE PORT  Special steps must be taken to access the port:  · Before the port is accessed, a numbing cream can be placed on the skin. This helps numb the skin over the port site.  · A sterile technique is used to access the port.  · The port is accessed with a needle. Only "non-coring" port needles should be used to access the port. Once the port is accessed, a blood return should be checked. This helps ensure the port is in the vein and is not clogged (clotted).  · If your caregiver believes your port should remain accessed, a clear (transparent) bandage will be placed over the needle site. The bandage and needle will need to be changed every week or as directed by your caregiver.  · Keep the bandage covering the needle clean and dry. Do not get it wet. Follow your caregiver's instructions on how to take a shower or bath when the port is accessed.  · If your port does not need to stay accessed, no bandage is needed over the port.  FLUSHING THE PORT  Flushing the port keeps it from getting clogged. How often the port is flushed depends on:  · If a   constant infusion is running. If a constant infusion is running, the port may not need to be flushed.  · If intermittent medicines are given.  · If the port is not being used.  For intermittent medicines:  · The port will need to be flushed:  · After medicines have been given.  · After blood has been drawn.  · As part of routine maintenance.  · A port is normally flushed with:  · Normal saline.  · Heparin.  · Follow your caregiver's advice on how often, how much, and the type of flush to use on your port.  IMPORTANT PORT INFORMATION  · Tell your caregiver if you are allergic to heparin.  · After your port is placed, you will get a manufacturer's information card. The card has information about your port. Keep this card with you at all times.  · There are many types of ports available. Know what kind of port you have.  · In case of an  emergency, it may be helpful to wear a medical alert bracelet. This can help alert health care workers that you have a port.  · The port can stay in for as long as your caregiver believes it is necessary.  · When it is time for the port to come out, surgery will be done to remove it. The surgery will be similar to how the port was put in.  · If you are in the hospital or clinic:  · Your port will be taken care of and flushed by a nurse.  · If you are at home:  · A home health care nurse may give medicines and take care of the port.  · You or a family member can get special training and directions for giving medicine and taking care of the port at home.  SEEK IMMEDIATE MEDICAL CARE IF:   · Your port does not flush or you are unable to get a blood return.  · New drainage or pus is coming from the incision.  · A bad smell is coming from the incision site.  · You develop swelling or increased redness at the incision site.  · You develop increased swelling or pain at the port site.  · You develop swelling or pain in the surrounding skin near the port.  · You have an oral temperature above 102° F (38.9° C), not controlled by medicine.  MAKE SURE YOU:   · Understand these instructions.  · Will watch your condition.  · Will get help right away if you are not doing well or get worse.  Document Released: 11/02/2005 Document Revised: 01/25/2012 Document Reviewed: 01/24/2009  [employer]® Patient Information ©2014 [employer].

## 2013-10-18 NOTE — Progress Notes (Signed)
OFFICE PROGRESS NOTE   10/18/2013   Physicians:P.Kwaitkowski, M.Manning, P.Drusilla Kanner, F.Lupton, B.Crenshaw, M.Martin, [facility], D.ClarkePearson.    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  Patient tells me that she has felt well since she was here last, with no significant discomfort from the nodular areas of porocarcinoma on LUE (3 largest areas now not in locations where tend to have much direct contact), no increased swelling or persistent pain in that arm, energy and appetite at baseline (fatigues with outdoor work but is up and active thru day otherwise), no recent infectious illness, no pain otherwise. She has noticed no changes at mastectomy scar or right breast and no abdominal or pelvic concerns. PAC flushed today.  She is to see PCP Dr Frederica Kuster 01-15-13  ONCOLOGIC HISTORY Patient was diagnosed with porocarcinoma involving 3 areas left hand and arm in spring 2012 by Dr.Lupton. She had re-excisions of all areas (dorsum left hand, 2 on left forearm and left arm) by Dr Herbie Saxon. She had significant lymphedema LUE after those procedures, with history of left breast cancer with 20 node left axillary dissection in 1994 followed by radiation. Repeat scans at [facility] in 05-2012 reportedly had no other distant disease. Because of progressive in transit mets, she had hyperthermic limb infusion by Dr Lenis Noon at [facility] in 06-2012 (chemotherapeutic agent not in notes available to me);  she also had excision of an area on dorsum of left hand in Oct 2013. She had significant swelling of LUE after the limb perfusion, which gradually improved.She had progression of the in transit mets LUE after the limb perfusion, which were not symptomatic. She was seen in consultation by Dr Orvilla Fus at [facility], with recommendation for gemzar/taxotere as systemic sarcoma regimen, chosen in part due to her prior chemotherapy regimens. [facility] notes report no evidence of disease on CXR there on 11-23-2012. She had a single cycle of gemcitabine taxotere at [facility] in Feb 2014, tolerated very poorly even with dose reduction, with hospitalization due to pancytopenia despite neulasta, requiring transfusions of PRBCs and platelets, as well as severe diarrhea with dehydration and electrolyte abnormalities, and related decrease in nutritional status and general debility; it has taken several months to improve PS back to baseline since that attempt at chemotherapy.  She additionally had ovarian cancer IA clear cell in 2004, treated after surgery with 3 cycles of taxol/ carboplatin. She saw Dr Yolande Jolly [phone] and is to see him again in a year. Her left breast cancer was T3N1 (2 of 20 nodes) ER+ in 1994, treated with mastectomy and axillary nodes, adriamycin/cytoxan x4, RT, and tamoxifen x 5 years. Last mammograms were at [facility] facility in [facility] 05-12-2013.   Review of systems as above, also: Bowels move regularly, no abdominal or pelvic discomfort. No bleeding. No SOB or respiratory symptoms. No bleeding or unusual bruising. Remainder of 10 point Review of Systems negative.  Objective:  Vital signs in last 24 hours:  BP 130/67  Pulse 81  Temp(Src) 98.2 F (36.8 C) (Oral)  Resp 20  Ht 5\' 2"  (1.575 m)  Wt 101 lb 8 oz (46.04 kg)  BMI 18.56 kg/m2 weight is up 0.5 lb.  Alert, oriented and appropriate. Ambulatory without assistance. Looks comfortable, cheerful, very pleasant as always. No  alopecia  HEENT:PERRL, sclerae not icteric. Oral mucosa moist without lesions, posterior pharynx clear.  Neck supple. No JVD.  Lymphatics:no cervical,suraclavicular, axillary adenopathy Resp: clear to auscultation bilaterally and normal percussion bilaterally Cardio: regular rate and rhythm. No gallop. GI: soft, nontender, not distended, no mass or organomegaly. Normally active bowel sounds. Surgical incision not remarkable. Musculoskeletal/ Extremities:bilateral LE and RUE without pitting edema, cords, tenderness. LUE has 1+ edema mostly noticeable in forearm, without erythema or evidence of cellulitis. Back nontender. Neuro:  nonfocal Skin without rash, ecchymosis, petechiae. Seborrheic keratosis area 2-3 cm diameter above mastectomy scar. In transit porocarcinoma lesions include 3 larger nodular areas up to 1 - 1.2 cm diameter at upper forearm medial/ upper arm several cm above elbow dorsal and medial. Smaller line of in-transit mets <0.5 cm dorsal forearm with some scaling, no bleeding, no surrounding erythema. Area just below elbow that was treated with RT is entirely regressed, small nontender scar only. Breasts: Right without dominant mass, skin or nipple findings. Axillae without mass or adenopathy. Left mastectomy scar without evidence of local recurrence. Portacath-without erythema or tenderness  Lab Results:  Results for orders placed in visit on 10/18/13  CBC WITH DIFFERENTIAL      Result Value Range   WBC 4.6  3.9 - 10.3 10e3/uL   NEUT# 3.2  1.5 - 6.5 10e3/uL   HGB 11.0 (*) 11.6 - 15.9 g/dL   HCT 16.1 (*) 09.6 - 04.5 %   Platelets 129 (*) 145 - 400 10e3/uL   MCV 96.1  79.5 - 101.0 fL   MCH 32.2  25.1 - 34.0 pg   MCHC 33.5  31.5 - 36.0 g/dL   RBC 4.09 (*) 8.11 - 9.14 10e6/uL   RDW 12.9  11.2 - 14.5 %   lymph# 0.7 (*) 0.9 - 3.3 10e3/uL   MONO# 0.4  0.1 - 0.9 10e3/uL   Eosinophils Absolute 0.1  0.0 - 0.5 10e3/uL   Basophils Absolute 0.0  0.0 - 0.1 10e3/uL   NEUT% 71.0  38.4  - 76.8 %   LYMPH% 16.4  14.0 - 49.7 %   MONO% 8.9  0.0 - 14.0 %   EOS% 3.0  0.0 - 7.0 %   BASO% 0.7  0.0 - 2.0 %  COMPREHENSIVE METABOLIC PANEL (CC13)      Result Value Range   Sodium 140  136 - 145 mEq/L   Potassium 3.9  3.5 - 5.1 mEq/L   Chloride 102  98 - 109 mEq/L   CO2 28  22 - 29 mEq/L   Glucose 97  70 - 140 mg/dl   BUN 78.2  7.0 - 95.6 mg/dL   Creatinine 0.9  0.6 - 1.1 mg/dL   Total Bilirubin 2.13 (*) 0.20 - 1.20 mg/dL   Alkaline Phosphatase 47  40 - 150 U/L   AST 17  5 - 34 U/L   ALT 14  0 - 55 U/L   Total Protein 6.4  6.4 - 8.3 g/dL   Albumin 3.7  3.5 - 5.0 g/dL   Calcium 9.3  8.4 - 08.6 mg/dL   Anion Gap 9  3 - 11 mEq/L     Studies/Results:  No results found. Last right mammo 05-12-13 [facility] Last CT CAP 05-02-13  Medications: I have reviewed the patient's current medications. She had flu vaccine this fall.  DISCUSSION: we have discussed options of following the porocarcinoma with observation, additional directed RT if symptomatic areas on the arm, and possibility of further dose reduction in gemzar/ taxotere if distant metastatic disease or otherwise more symptomatic. She much prefers following with observation still. She is in agreement with leaving PAC, as this is not at all bothersome to her.   Assessment/Plan: 1.Porocarcinoma LUE with in transit mets: post several excisions, hyperbaric limb perfusion at [facility], one cycle of gemzar taxotere Feb 2014 with partial response of the lesions but tolerated extremely poorly such that we have followed with observation since then. Post directed RT to single symptomatic area LUE with excellent response there. Follow up ~ 4 mo coordinating with PAC flush, or sooner if needed. WIll recheck CBC and CMET with my visit IF these have not been done within ~ a month prior (note visit with Dr Frederica Kuster 01-15-13)/ 2.Node positive left breast cancer not known recurrent: as above  3.LUE lymphedema  4.IA ovarian cancer 2004  5.flu  vaccine given today  6.cardiomyopathy 2008  7.osteoporosis with vertebral compression fracture previously. No pain now. 8.PAC in since attempt at chemotherapy in early 2014. Peripheral IV access is extremely poor such that PAC has been maintained.    Patient followed all of discussion and had questions answered to her satisfaction.    LIVESAY,LENNIS P, MD   10/18/2013, 9:09 AM

## 2013-10-25 ENCOUNTER — Telehealth: Payer: Self-pay

## 2013-10-25 DIAGNOSIS — C4499 Other specified malignant neoplasm of skin, unspecified: Secondary | ICD-10-CM

## 2013-10-25 DIAGNOSIS — Z853 Personal history of malignant neoplasm of breast: Secondary | ICD-10-CM

## 2013-10-25 NOTE — Telephone Encounter (Signed)
Ms. Longino states that Dr. Terri Piedra could feel a moveable  lymph node in left axilla at visit on 10-23-13.  He suggested that she call Dr.Livesay to have her follow this finding. Told ms. Frasier that Dr. Darrold Span is out of the office and this note would be given to Dr. Darrold Span upon her return on 10-27-13. Obtained office Note from Dr. Dorita Sciara visit for Dr. Darrold Span to review.

## 2013-10-26 ENCOUNTER — Telehealth: Payer: Self-pay

## 2013-10-26 ENCOUNTER — Other Ambulatory Visit: Payer: Self-pay | Admitting: Oncology

## 2013-10-26 DIAGNOSIS — R599 Enlarged lymph nodes, unspecified: Secondary | ICD-10-CM

## 2013-10-26 NOTE — Telephone Encounter (Signed)
Told Judy Herrera that Dr. Darrold Span said to get an US of the Left Axilla in the next 4-6 weeks, with an appointment  with her after [place] completed.  Patient verbalized understanding. Sent POF to scheduling.  Placed [place] order in Epic for [place].

## 2013-10-26 NOTE — Telephone Encounter (Signed)
Gave Ms. Swarey an appointment for the [place] [facility] for [facility]12 and follow up appointment with Dr. Darrold Span on 11-22-12.  Patient verbalized understanding.

## 2013-11-22 ENCOUNTER — Ambulatory Visit
Admission: RE | Admit: 2013-11-22 | Discharge: 2013-11-22 | Disposition: A | Discharge status: Home / Self Care | Payer: Medicare HMO | Source: Ambulatory Visit | Attending: Oncology | Admitting: Oncology

## 2013-11-22 DIAGNOSIS — R599 Enlarged lymph nodes, unspecified: Secondary | ICD-10-CM

## 2013-11-26 ENCOUNTER — Other Ambulatory Visit: Payer: Self-pay | Admitting: Oncology

## 2013-11-26 ENCOUNTER — Encounter: Payer: Self-pay | Admitting: Oncology

## 2013-11-27 ENCOUNTER — Ambulatory Visit (HOSPITAL_BASED_OUTPATIENT_CLINIC_OR_DEPARTMENT_OTHER): Payer: Medicare HMO

## 2013-11-27 ENCOUNTER — Encounter (INDEPENDENT_AMBULATORY_CARE_PROVIDER_SITE_OTHER): Payer: Self-pay

## 2013-11-27 ENCOUNTER — Telehealth: Payer: Self-pay | Admitting: Oncology

## 2013-11-27 ENCOUNTER — Encounter: Payer: Self-pay | Admitting: Oncology

## 2013-11-27 ENCOUNTER — Ambulatory Visit (HOSPITAL_BASED_OUTPATIENT_CLINIC_OR_DEPARTMENT_OTHER): Payer: Medicare HMO | Admitting: Oncology

## 2013-11-27 ENCOUNTER — Other Ambulatory Visit: Payer: Medicare HMO

## 2013-11-27 VITALS — BP 144/68 | HR 83 | Temp 98.1°F | Resp 18 | Ht 62.0 in | Wt 101.9 lb

## 2013-11-27 DIAGNOSIS — Z95828 Presence of other vascular implants and grafts: Secondary | ICD-10-CM

## 2013-11-27 DIAGNOSIS — M810 Age-related osteoporosis without current pathological fracture: Secondary | ICD-10-CM

## 2013-11-27 DIAGNOSIS — C44691 Other specified malignant neoplasm of skin of unspecified upper limb, including shoulder: Secondary | ICD-10-CM

## 2013-11-27 DIAGNOSIS — C4499 Other specified malignant neoplasm of skin, unspecified: Secondary | ICD-10-CM

## 2013-11-27 DIAGNOSIS — I890 Lymphedema, not elsewhere classified: Secondary | ICD-10-CM

## 2013-11-27 DIAGNOSIS — Z853 Personal history of malignant neoplasm of breast: Secondary | ICD-10-CM

## 2013-11-27 DIAGNOSIS — C569 Malignant neoplasm of unspecified ovary: Secondary | ICD-10-CM

## 2013-11-27 DIAGNOSIS — Z8543 Personal history of malignant neoplasm of ovary: Secondary | ICD-10-CM

## 2013-11-27 LAB — CBC WITH DIFFERENTIAL/PLATELET
BASO%: 1.1 % (ref 0.0–2.0)
Basophils Absolute: 0 10*3/uL (ref 0.0–0.1)
EOS%: 3.6 % (ref 0.0–7.0)
Eosinophils Absolute: 0.1 10*3/uL (ref 0.0–0.5)
HEMATOCRIT: 32.6 % — AB (ref 34.8–46.6)
HGB: 11.2 g/dL — ABNORMAL LOW (ref 11.6–15.9)
LYMPH%: 18.8 % (ref 14.0–49.7)
MCH: 32.8 pg (ref 25.1–34.0)
MCHC: 34.3 g/dL (ref 31.5–36.0)
MCV: 95.7 fL (ref 79.5–101.0)
MONO#: 0.4 10*3/uL (ref 0.1–0.9)
MONO%: 9 % (ref 0.0–14.0)
NEUT#: 2.7 10*3/uL (ref 1.5–6.5)
NEUT%: 67.5 % (ref 38.4–76.8)
PLATELETS: 141 10*3/uL — AB (ref 145–400)
RBC: 3.41 10*6/uL — AB (ref 3.70–5.45)
RDW: 13 % (ref 11.2–14.5)
WBC: 3.9 10*3/uL (ref 3.9–10.3)
lymph#: 0.7 10*3/uL — ABNORMAL LOW (ref 0.9–3.3)

## 2013-11-27 LAB — COMPREHENSIVE METABOLIC PANEL (CC13)
ALK PHOS: 45 U/L (ref 40–150)
ALT: 15 U/L (ref 0–55)
AST: 16 U/L (ref 5–34)
Albumin: 3.7 g/dL (ref 3.5–5.0)
Anion Gap: 10 mEq/L (ref 3–11)
BILIRUBIN TOTAL: 1.52 mg/dL — AB (ref 0.20–1.20)
BUN: 20.6 mg/dL (ref 7.0–26.0)
CO2: 26 meq/L (ref 22–29)
CREATININE: 0.9 mg/dL (ref 0.6–1.1)
Calcium: 9.5 mg/dL (ref 8.4–10.4)
Chloride: 101 mEq/L (ref 98–109)
Glucose: 110 mg/dl (ref 70–140)
Potassium: 4.2 mEq/L (ref 3.5–5.1)
SODIUM: 138 meq/L (ref 136–145)
TOTAL PROTEIN: 6.3 g/dL — AB (ref 6.4–8.3)

## 2013-11-27 LAB — CA 125: CA 125: 8.8 U/mL (ref 0.0–30.2)

## 2013-11-27 MED ORDER — HEPARIN SOD (PORK) LOCK FLUSH 100 UNIT/ML IV SOLN
500.0000 [IU] | Freq: Once | INTRAVENOUS | Status: AC
  Administered 2013-11-27: 500 [IU] via INTRAVENOUS
  Filled 2013-11-27: qty 5

## 2013-11-27 MED ORDER — SODIUM CHLORIDE 0.9 % IJ SOLN
10.0000 mL | INTRAMUSCULAR | Status: DC | PRN
  Administered 2013-11-27: 10 mL via INTRAVENOUS
  Filled 2013-11-27: qty 10

## 2013-11-27 NOTE — Progress Notes (Signed)
OFFICE PROGRESS NOTE   11/27/2013   Physicians:P.Kwaitkowski, M.Manning, P.Lindaann Pascal, F.Lupton, B.Crenshaw, M.Martin, [facility], D.ClarkePearson.   INTERVAL HISTORY:  Patient is seen, alone for visit, earlier than originally planned due to concern for left axillary lymph node which Dr Allyson Sabal palpated at his visit 10-23-13. Patient subsequently had attempted [place] at [facility] on 11-22-13, which was a difficult procedure due to very limited mobility of left shoulder; the [place] did not show adenopathy and exam by breast radiologist then did not find anything of obvious concern.  Patient denies any new or different symptoms in left axilla or left chest wall. She has not noticed any changes in right breast. The nodular areas of in transit porocarcinoma LUE are not uncomfortable for her and are not bleeding; lymphedema in LUE seems fairly stable. She denies SOB or cough, has not been ill, did have flu vaccine and knows to call for tamiflu if influenza symptoms. Back pain is manageable, tho more noticeable in cold weather and with rain.  She has PAC, flushed today.   ONCOLOGIC HISTORY Patient was diagnosed with porocarcinoma involving 3 areas left hand and arm in spring 2012 by Dr.Lupton. She had re-excisions of all areas (dorsum left hand, 2 on left forearm and left arm) by Dr Ronnette Hila. She had significant lymphedema LUE after those procedures, with history of left breast cancer with 20 node left axillary dissection in 1994 followed by radiation. Repeat scans at [facility] in 05-2012 reportedly had no other distant disease. Because of progressive in transit mets, she had hyperthermic limb infusion by Dr Clovis Riley at [facility] in 06-2012 (chemotherapeutic agent not in notes available to me); she also had excision of an area on dorsum of left hand in Oct 2013. She had significant swelling of LUE after the limb perfusion, which gradually improved.She had progression of the in transit mets LUE after the limb  perfusion, which were not symptomatic. She was seen in consultation by Dr Turner Daniels at [facility], with recommendation for gemzar/taxotere as systemic sarcoma regimen, chosen in part due to her prior chemotherapy regimens. [facility] notes report no evidence of disease on CXR there on 11-23-2012. She had a single cycle of gemcitabine taxotere at [facility] in Feb 2014, tolerated very poorly even with dose reduction, with hospitalization due to pancytopenia despite neulasta, requiring transfusions of PRBCs and platelets, as well as severe diarrhea with dehydration and electrolyte abnormalities, and related decrease in nutritional status and general debility; it then took several months to improve PS back to baseline since that attempt at chemotherapy. She has not wanted any further attempt at chemotherapy for the porocarcinoma to this point. She did have good improvement in bothersome tumor nodule on left olecranon process with RT by Dr Tammi Klippel [phone], with 40 Gy in 10 fractions; the RT did not cause any significant worsening of baseline LUE lymphedema.  History is also remarkable for left breast cancer T3N1 (2 of 20 nodes) ER+ in 1994, treated with mastectomy and axillary nodes, adriamycin/cytoxan x4, RT, and tamoxifen x 5 years. Last right mammogram was in [facility] at [facility] facility 05-12-13, with extremely dense breast tissue (did not have 3D tomo).  She also had ovarian cancer IA clear cell in 2004, treated after surgery with 3 cycles of taxol/ carboplatin. She saw Dr Josephina Shih [phone] and is to see him again in a year.       Review of systems as above, also: Appetite good. No abdominal or pelvic symptoms. No LE swelling. No problems with  PAC. No fever or symptoms of infection Remainder of 10 point Review of Systems negative.  Objective:  Vital signs in last 24 hours:  BP 144/68  Pulse 83  Temp(Src) 98.1 F (36.7 C) (Oral)  Resp 18  Ht 5\' 2"  (1.575 m)  Wt 101 lb 14.4 oz (46.222 kg)  BMI  18.63 kg/m2 Weight is up 0.5 lb. Alert, oriented and appropriate. Ambulatory without assistance difficulty.    HEENT:PERRL, sclerae not icteric. Oral mucosa moist without lesions, posterior pharynx clear.  Neck supple. No JVD.  Lymphatics:no cervical or suraclavicular adenopathy. No clear adenopathy in either axilla. Resp: clear to auscultation bilaterally and normal percussion bilaterally Cardio: regular rate and rhythm. No gallop. GI: soft, nontender, not distended. Normally active bowel sounds.  Musculoskeletal/ Extremities: RUE and bilateral LE without pitting edema, cords, tenderness. LUE with 1+ lymphedema, slightly more pronounced than at last exam. Very limited ROM left shoulder.  Neuro: nonfocal other than limitations of LUE. Skin:nodular areas of in transit porocarcinoma from forearm to about 5 cm above elbow, ranging in size from 2-3 mm up to a little over 1 cm diameter,with one area on forearm and 2 areas upper arm most prominent. None of those areas are tender or appear to be bleeding. otherwise without rash, ecchymosis, petechiae Breasts: left mastectomy scar without evidence of local recurrence. Right breast without dominant mass, skin or nipple findings.  Lab Results:  Results for orders placed in visit on 10/18/13  CBC WITH DIFFERENTIAL      Result Value Range   WBC 4.6  3.9 - 10.3 10e3/uL   NEUT# 3.2  1.5 - 6.5 10e3/uL   HGB 11.0 (*) 11.6 - 15.9 g/dL   HCT 32.9 (*) 34.8 - 46.6 %   Platelets 129 (*) 145 - 400 10e3/uL   MCV 96.1  79.5 - 101.0 fL   MCH 32.2  25.1 - 34.0 pg   MCHC 33.5  31.5 - 36.0 g/dL   RBC 3.43 (*) 3.70 - 5.45 10e6/uL   RDW 12.9  11.2 - 14.5 %   lymph# 0.7 (*) 0.9 - 3.3 10e3/uL   MONO# 0.4  0.1 - 0.9 10e3/uL   Eosinophils Absolute 0.1  0.0 - 0.5 10e3/uL   Basophils Absolute 0.0  0.0 - 0.1 10e3/uL   NEUT% 71.0  38.4 - 76.8 %   LYMPH% 16.4  14.0 - 49.7 %   MONO% 8.9  0.0 - 14.0 %   EOS% 3.0  0.0 - 7.0 %   BASO% 0.7  0.0 - 2.0 %  COMPREHENSIVE  METABOLIC PANEL (ST41)      Result Value Range   Sodium 140  136 - 145 mEq/L   Potassium 3.9  3.5 - 5.1 mEq/L   Chloride 102  98 - 109 mEq/L   CO2 28  22 - 29 mEq/L   Glucose 97  70 - 140 mg/dl   BUN 16.6  7.0 - 26.0 mg/dL   Creatinine 0.9  0.6 - 1.1 mg/dL   Total Bilirubin 1.42 (*) 0.20 - 1.20 mg/dL   Alkaline Phosphatase 47  40 - 150 U/L   AST 17  5 - 34 U/L   ALT 14  0 - 55 U/L   Total Protein 6.4  6.4 - 8.3 g/dL   Albumin 3.7  3.5 - 5.0 g/dL   Calcium 9.3  8.4 - 10.4 mg/dL   Anion Gap 9  3 - 11 mEq/L     Studies/Results:  ULTRASOUND OF THE LEFT AXILLA  11-22-13 COMPARISON: None  FINDINGS:  On physical exam,no mass is palpated in the left axilla. Sonography  is difficult due to the inability of the patient to elevate her left  arm ; it is difficult to place the transducer in the left axilla.  Ultrasound is performed, showing no abnormal left axillary nodes.  IMPRESSION:  No sonographic abnormality identified in the left axilla.  RECOMMENDATION:  Patient is due for right screening mammogram 25 April 2014.  Medications: I have reviewed the patient's current medications. She has had flu vaccine. We discussed use of tamiflu if she develops influenza symptoms.   DISCUSSION: she would like PAC flushed today instead of returning to office later this month, so appointments for lab and flush adjusted now and for next in March. She very much prefers observation to further chemotherapy as no significant local symptoms from the porocarcinoma.  Assessment/Plan: 1.Porocarcinoma LUE with in transit mets: post several excisions, hyperbaric limb perfusion at [facility], one cycle of gemzar taxotere Feb 2014 with partial response of the lesions but tolerated extremely poorly such that we have followed with observation since then. Post directed RT to single symptomatic area LUE with excellent response there. Follow up ~ 2 mo coordinating with PAC flush, or sooner if needed. Have not clearly  demonstrated adenopathy in left axilla, will follow up at next visit. Certainly she is likely to have further spread of the porocarcinoma at some point.   2.Node positive left breast cancer not known recurrent: mammogram on right 04-2014 3.LUE lymphedema seems slightly moreso today, possibly with the gradual increase in porocarcinoma. Not bothersome to her now, follow. 4.IA ovarian cancer 2004. Clinically continues to do well in this regard. 5.flu vaccine given  6.cardiomyopathy 2008  7.osteoporosis with vertebral compression fracture previously.  8.PAC in since attempt at chemotherapy in early 2014. Peripheral IV access is extremely poor such that PAC has been maintained. Flush today and March appointment moved due to date change.   TIme spent 25 min including >50% in counseling and coordination of care     LIVESAY,LENNIS P, MD   11/27/2013, 9:11 AM

## 2013-11-27 NOTE — Patient Instructions (Signed)

## 2013-11-30 ENCOUNTER — Telehealth: Payer: Self-pay | Admitting: Gynecologic Oncology

## 2013-11-30 NOTE — Telephone Encounter (Signed)
Message left with CA 125 results and asking the patient the please call the office to schedule an appt in June 2015 with Dr. Fermin Schwab.

## 2013-12-13 ENCOUNTER — Other Ambulatory Visit: Payer: Medicare Other

## 2014-01-12 ENCOUNTER — Encounter: Payer: Self-pay | Admitting: Internal Medicine

## 2014-01-12 ENCOUNTER — Ambulatory Visit (INDEPENDENT_AMBULATORY_CARE_PROVIDER_SITE_OTHER): Payer: Medicare HMO | Admitting: Internal Medicine

## 2014-01-12 VITALS — BP 120/70 | HR 78 | Temp 97.6°F | Wt 104.0 lb

## 2014-01-12 DIAGNOSIS — I10 Essential (primary) hypertension: Secondary | ICD-10-CM

## 2014-01-12 DIAGNOSIS — I447 Left bundle-branch block, unspecified: Secondary | ICD-10-CM | POA: Insufficient documentation

## 2014-01-12 DIAGNOSIS — I452 Bifascicular block: Secondary | ICD-10-CM

## 2014-01-12 DIAGNOSIS — I2510 Atherosclerotic heart disease of native coronary artery without angina pectoris: Secondary | ICD-10-CM

## 2014-01-12 NOTE — Progress Notes (Signed)
Subjective:    Patient ID: Judy Herrera, female    DOB: 1931/05/15, 80 y.o.   MRN: [id number]  HPI  80 year old patient who is followed closely by oncology and has a very complex oncological history.  She was seen last month and has been stable. She has treated hypertension was also has been stable.  She has coronary artery disease.  History chronic left bundle branch block and cardiomyopathy.  Denies any cardiopulmonary complaints.  She has osteoporosis, which she is being treated.  No new concerns or complaints  Past Medical History  Diagnosis Date  . Hypertension   . Allergy   . Cancer   . Ovarian ca   . Cataract   . Breast CA   . CAD (coronary artery disease)   . Hyperlipidemia   . Cardiomyopathy     improved  . Arthritis   . Osteoporosis   . History of radiation therapy 1994    left chest wall  . History of chemotherapy     History   Social History  . Marital Status: Widowed    Spouse Name: N/A    Number of Children: N/A  . Years of Education: N/A   Occupational History  . Not on file.   Social History Main Topics  . Smoking status: Never Smoker   . Smokeless tobacco: Never Used  . Alcohol Use: No  . Drug Use: No  . Sexual Activity: Not on file     Comment: didn't ask   Other Topics Concern  . Not on file   Social History Narrative  . No narrative on file    Past Surgical History  Procedure Laterality Date  . Left breast mastectomy  1994    T3 N1 Mx  . Shoulder arthroscopy    . Right leg    . Shoulder surgery      replacement  . Modified radical mastectomy w/ axillary lymph node dissection w/ pectoralis minor excision  1994    Left  . Eye surgery      CATARACTS  . Abdominal hysterectomy  2004  . Cholecystectomy  1993  . Breast surgery    . Skin biopsy  04/27/2011    left hand, forearm and posterior arm    Family History  Problem Relation Age of Onset  . Breast cancer Sister   . Cancer Sister   . Heart disease Sister   . Heart  disease Father   . Heart disease Mother     Allergies  Allergen Reactions  . Ibuprofen Nausea Only  . Morphine Nausea And Vomiting    Current Outpatient Prescriptions on File Prior to Visit  Medication Sig Dispense Refill  . acyclovir (ZOVIRAX) 400 MG tablet Take 0.5 tablets (200 mg total) by mouth 3 (three) times daily as needed (for fever blisters.).  45 tablet  3  . amLODipine (NORVASC) 2.5 MG tablet TAKE 1 TABLET DAILY.  90 tablet  3  . aspirin EC 81 MG tablet Take 81 mg by mouth at bedtime.       . Calcium Carbonate-Vitamin D (CALCIUM + D PO) Take 600 mg by mouth daily after lunch.       . carvedilol (COREG) 25 MG tablet TAKE 1 TABLET 2 TIMES A DAY WITH FOOD.  180 tablet  1  . Cholecalciferol (VITAMIN D) 2000 UNITS tablet Take 2,000 Units by mouth daily.      . Coenzyme Q10 (CO Q 10) 100 MG CAPS Take 1 capsule by mouth  daily.      . denosumab (PROLIA) 60 MG/ML SOLN injection Inject 60 mg into the skin every 6 (six) months. Administer in upper arm, thigh, or abdomen      . ferrous gluconate (FERGON) 324 MG tablet Take 324 mg by mouth once a week. Takes 2 tabs on empty stomach with OJ on Monday.      Marland Kitchen HYDROcodone-acetaminophen (NORCO/VICODIN) 5-325 MG per tablet Take 1 tablet by mouth every 6 (six) hours as needed for pain.      Marland Kitchen lisinopril (PRINIVIL,ZESTRIL) 20 MG tablet Take 1 tablet (20 mg total) by mouth at bedtime.  90 tablet  3  . loperamide (IMODIUM) 2 MG capsule Take 1 capsule (2 mg total) by mouth as needed for diarrhea or loose stools.  20 capsule  0  . simvastatin (ZOCOR) 40 MG tablet Take 1 tablet (40 mg total) by mouth at bedtime.  90 tablet  3  . traMADol (ULTRAM) 50 MG tablet Take 1 tablet (50 mg total) by mouth every 6 (six) hours as needed for pain.  60 tablet  4   No current facility-administered medications on file prior to visit.    BP 120/70  Pulse 78  Temp(Src) 97.6 F (36.4 C) (Oral)  Wt 104 lb (47.174 kg)  SpO2 98%       Review of Systems    Constitutional: Negative.   HENT: Negative for congestion, dental problem, hearing loss, rhinorrhea, sinus pressure, sore throat and tinnitus.   Eyes: Negative for pain, discharge and visual disturbance.  Respiratory: Negative for cough and shortness of breath.   Cardiovascular: Negative for chest pain, palpitations and leg swelling.  Gastrointestinal: Negative for nausea, vomiting, abdominal pain, diarrhea, constipation, blood in stool and abdominal distention.  Genitourinary: Negative for dysuria, urgency, frequency, hematuria, flank pain, vaginal bleeding, vaginal discharge, difficulty urinating, vaginal pain and pelvic pain.  Musculoskeletal: Negative for arthralgias, gait problem and joint swelling.  Skin: Negative for rash.  Neurological: Negative for dizziness, syncope, speech difficulty, weakness, numbness and headaches.  Hematological: Negative for adenopathy.  Psychiatric/Behavioral: Negative for behavioral problems, dysphoric mood and agitation. The patient is not nervous/anxious.        Objective:   Physical Exam  Constitutional: She is oriented to person, place, and time. She appears well-developed and well-nourished.  HENT:  Head: Normocephalic.  Right Ear: External ear normal.  Left Ear: External ear normal.  Mouth/Throat: Oropharynx is clear and moist.  Eyes: Conjunctivae and EOM are normal. Pupils are equal, round, and reactive to light.  Neck: Normal range of motion. Neck supple. No thyromegaly present.  Left supraclavicular bruit  Cardiovascular: Normal rate, regular rhythm and intact distal pulses.   Murmur heard. Pulmonary/Chest: Effort normal and breath sounds normal.  Abdominal: Soft. Bowel sounds are normal. She exhibits no mass. There is no tenderness.  Musculoskeletal: Normal range of motion.  Lymphadenopathy:    She has no cervical adenopathy.  Neurological: She is alert and oriented to person, place, and time.  Skin: Skin is warm and dry. No rash noted.   Psychiatric: She has a normal mood and affect. Her behavior is normal.          Assessment & Plan:   Hypertension well controlled.  Continue present regimen Osteoporosis.  Continue prolia Dyslipidemia.  Continue statin therapy

## 2014-01-12 NOTE — Progress Notes (Signed)
Pre visit review using our clinic review tool, if applicable. No additional management support is needed unless otherwise documented below in the visit note. 

## 2014-01-12 NOTE — Patient Instructions (Signed)
Limit your sodium (Salt) intake    It is important that you exercise regularly, at least 20 minutes 3 to 4 times per week.  If you develop chest pain or shortness of breath seek  medical attention.  Return in 6 months for follow-up  

## 2014-01-15 ENCOUNTER — Ambulatory Visit: Payer: Medicare Other | Admitting: Internal Medicine

## 2014-01-15 ENCOUNTER — Telehealth: Payer: Self-pay | Admitting: Internal Medicine

## 2014-01-15 NOTE — Telephone Encounter (Signed)
Relevant patient education mailed to patient.  

## 2014-01-28 ENCOUNTER — Other Ambulatory Visit: Payer: Self-pay | Admitting: Oncology

## 2014-01-29 ENCOUNTER — Other Ambulatory Visit: Payer: Self-pay | Admitting: Internal Medicine

## 2014-02-01 ENCOUNTER — Encounter: Payer: Self-pay | Admitting: Oncology

## 2014-02-01 ENCOUNTER — Telehealth: Payer: Self-pay | Admitting: Oncology

## 2014-02-01 ENCOUNTER — Telehealth: Payer: Self-pay | Admitting: *Deleted

## 2014-02-01 ENCOUNTER — Ambulatory Visit (HOSPITAL_BASED_OUTPATIENT_CLINIC_OR_DEPARTMENT_OTHER): Payer: Medicare HMO | Admitting: Oncology

## 2014-02-01 ENCOUNTER — Ambulatory Visit (HOSPITAL_BASED_OUTPATIENT_CLINIC_OR_DEPARTMENT_OTHER): Payer: Medicare HMO

## 2014-02-01 ENCOUNTER — Other Ambulatory Visit (HOSPITAL_BASED_OUTPATIENT_CLINIC_OR_DEPARTMENT_OTHER): Payer: Medicare HMO

## 2014-02-01 VITALS — BP 161/69 | HR 86 | Temp 98.2°F | Resp 18 | Ht 62.0 in | Wt 103.4 lb

## 2014-02-01 VITALS — BP 120/68 | HR 86 | Temp 97.6°F

## 2014-02-01 DIAGNOSIS — C4499 Other specified malignant neoplasm of skin, unspecified: Secondary | ICD-10-CM

## 2014-02-01 DIAGNOSIS — C44621 Squamous cell carcinoma of skin of unspecified upper limb, including shoulder: Secondary | ICD-10-CM

## 2014-02-01 DIAGNOSIS — Z853 Personal history of malignant neoplasm of breast: Secondary | ICD-10-CM

## 2014-02-01 DIAGNOSIS — Z8543 Personal history of malignant neoplasm of ovary: Secondary | ICD-10-CM

## 2014-02-01 DIAGNOSIS — Z1231 Encounter for screening mammogram for malignant neoplasm of breast: Secondary | ICD-10-CM

## 2014-02-01 DIAGNOSIS — C569 Malignant neoplasm of unspecified ovary: Secondary | ICD-10-CM

## 2014-02-01 LAB — CBC WITH DIFFERENTIAL/PLATELET
BASO%: 0.9 % (ref 0.0–2.0)
BASOS ABS: 0 10*3/uL (ref 0.0–0.1)
EOS%: 2.6 % (ref 0.0–7.0)
Eosinophils Absolute: 0.1 10*3/uL (ref 0.0–0.5)
HEMATOCRIT: 32.8 % — AB (ref 34.8–46.6)
HEMOGLOBIN: 11.2 g/dL — AB (ref 11.6–15.9)
LYMPH#: 0.8 10*3/uL — AB (ref 0.9–3.3)
LYMPH%: 16.5 % (ref 14.0–49.7)
MCH: 32.2 pg (ref 25.1–34.0)
MCHC: 34.2 g/dL (ref 31.5–36.0)
MCV: 94.1 fL (ref 79.5–101.0)
MONO#: 0.4 10*3/uL (ref 0.1–0.9)
MONO%: 8.4 % (ref 0.0–14.0)
NEUT#: 3.3 10*3/uL (ref 1.5–6.5)
NEUT%: 71.6 % (ref 38.4–76.8)
PLATELETS: 133 10*3/uL — AB (ref 145–400)
RBC: 3.48 10*6/uL — ABNORMAL LOW (ref 3.70–5.45)
RDW: 12.9 % (ref 11.2–14.5)
WBC: 4.6 10*3/uL (ref 3.9–10.3)

## 2014-02-01 LAB — COMPREHENSIVE METABOLIC PANEL (CC13)
ALT: 9 U/L (ref 0–55)
AST: 15 U/L (ref 5–34)
Albumin: 3.7 g/dL (ref 3.5–5.0)
Alkaline Phosphatase: 42 U/L (ref 40–150)
Anion Gap: 9 mEq/L (ref 3–11)
BILIRUBIN TOTAL: 1.32 mg/dL — AB (ref 0.20–1.20)
BUN: 20.5 mg/dL (ref 7.0–26.0)
CALCIUM: 9.4 mg/dL (ref 8.4–10.4)
CHLORIDE: 103 meq/L (ref 98–109)
CO2: 29 mEq/L (ref 22–29)
CREATININE: 1 mg/dL (ref 0.6–1.1)
Glucose: 93 mg/dl (ref 70–140)
Potassium: 4.2 mEq/L (ref 3.5–5.1)
Sodium: 140 mEq/L (ref 136–145)
Total Protein: 6.2 g/dL — ABNORMAL LOW (ref 6.4–8.3)

## 2014-02-01 MED ORDER — SODIUM CHLORIDE 0.9 % IJ SOLN
10.0000 mL | INTRAMUSCULAR | Status: DC | PRN
  Administered 2014-02-01: 10 mL via INTRAVENOUS
  Filled 2014-02-01: qty 10

## 2014-02-01 MED ORDER — HEPARIN SOD (PORK) LOCK FLUSH 100 UNIT/ML IV SOLN
500.0000 [IU] | Freq: Once | INTRAVENOUS | Status: AC
  Administered 2014-02-01: 500 [IU] via INTRAVENOUS
  Filled 2014-02-01: qty 5

## 2014-02-01 NOTE — Telephone Encounter (Signed)
, °

## 2014-02-01 NOTE — Patient Instructions (Signed)

## 2014-02-01 NOTE — Telephone Encounter (Signed)
Received a request from Dr. Marko Plume to schedule a genetic appt for pt.  Called and confirmed 02/06/14 genetic appt w/ pt.  Unable to schedule the appt - emailed Santiago Glad to enter in and I entered the lab.  Emailed Dr. Marko Plume to make her aware.

## 2014-02-01 NOTE — Progress Notes (Signed)
OFFICE PROGRESS NOTE   02/01/2014   Physicians:P.Kwaitkowski, M.Manning, P.Lindaann Pascal, F.Lupton, B.Crenshaw, M.Martin,  GI, D.ClarkePearson.   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  She is on observation for the porocarcinoma at her preference, treated previously with limb perfusion at [facility] 08-2012, single treatment of gemzar taxotere which she tolerated poorly but which did give some transient improvement in the porocarcinoma lesions then, and local radiation to one symptomatic nodule by Dr Tammi Klippel 05-2013. She will be due right mammogram at [facility] in 04-2014. She had attempted [place] left axilla 11-2013, difficult due to chronic very limited mobility left shoulder. She is to see Dr Josephina Shih next 04-27-14, on yearly follow up there.  Patient tells me that her sister, Catalina Lunger, has recently been found to be BRCA 2 positive. Note that of 5 sisters, 4 have had breast cancer, including Mrs. Mare Ferrari. Mrs Tarkowski is the only sibling with gyn cancer. One brother has no known cancer; Mrs Labella has one son who has had no cancer. She is now interested in meeting with genetics counselor and would be in agreement with testing.   Patient tells me that she has felt fairly well since last visit, with no infectious illness, usual back pain, no bleeding, appetite at baseline, no respiratory problems. She is tolerating the LUE lymphedema and porocarcinoma lesions.  She has PAC, flushed today. This was placed for chemo and has been maintained due to very poor peripheral venous access.  ONCOLOGIC HISTORY Patient was diagnosed with porocarcinoma involving 3 areas left hand and arm in spring 2012 by Dr.Lupton. She had re-excisions of all areas (dorsum left hand, 2 on left forearm and left arm) by Dr Ronnette Hila. She had significant lymphedema LUE  after those procedures, with history of left breast cancer with 20 node left axillary dissection in 1994 followed by radiation. Repeat scans at [facility] in 05-2012 had no distant disease. With progressive in transit mets, she had hyperthermic limb perfusion by Dr Clovis Riley at [facility] in 06-2012 and excision of an area on dorsum of left hand in Oct 2013. She had significant swelling of LUE after the limb perfusion, which gradually improved, and progression of the in transit mets LUE after the limb perfusion. She saw Dr Turner Daniels at [facility], with recommendation for gemzar/taxotere as systemic sarcoma regimen, chosen in part due to her prior chemotherapy. She had no evidence of disease on CXR at [facility] 11-23-2012. She had single cycle of dose reduced gemcitabine taxotere Feb 2014, tolerated very poorly, with hospitalization due to pancytopenia despite neulasta, requiring transfusions of PRBCs and platelets, as well as severe diarrhea and related decrease in nutritional status and general debility.  PS took several months to improve back to baseline after that attempt at chemotherapy. She has not wanted any further attempt at chemotherapy for the porocarcinoma to this point. She did have good improvement in bothersome tumor nodule on left olecranon process with RT by Dr Tammi Klippel [phone], with 40 Gy in 10 fractions; the RT did not cause any significant worsening of baseline LUE lymphedema.   She had left breast cancer T3N1 (2 of 20 nodes) ER+ in 1994, treated with mastectomy and axillary node dissection, adriamycin/cytoxan x4, RT, and tamoxifen x 5 years. Last right mammogram was in [facility] at [facility] facility 05-12-13, with extremely dense breast tissue (did not have 3D tomo, tho this would be  appropriate with the dense tissue).  She had IA clear cell ovarian cancer in 2004, treated with surgery followed by 3 cycles of taxol/ carboplatin. She saw Dr Josephina Shih [phone] and is to see him again in a year. She has  had no known recurrence of the gyn cancer.    Review of systems as above, also: Bowels ok. No changes right breast noted. No swelling LE. The porocarcinoma areas are puritic at times, larger areas not in locations that are usually pressure points. Remainder of 10 point Review of Systems negative.  Objective:  Vital signs in last 24 hours:  BP 161/69  Pulse 86  Temp(Src) 98.2 F (36.8 C) (Oral)  Resp 18  Ht 5' 2"  (1.575 m)  Wt 103 lb 6.4 oz (46.902 kg)  BMI 18.91 kg/m2 weight is down 0.5 lb.  Alert, oriented and appropriate, quiet but very pleasant as always. Ambulatory without assistance.   HEENT:PERRL, sclerae not icteric. Oral mucosa moist without lesions, posterior pharynx clear.  Neck supple. No JVD.  Lymphatics:no cervical,suraclavicular, axillary adenopathy Resp: clear to auscultation bilaterally  Cardio: regular rate and rhythm. No gallop. GI: soft, nontender, not distended. Normally active bowel sounds.  Musculoskeletal/ Extremities: RUE and LE without pitting edema, cords, tenderness. 1-2+ lymphedema LUE, which seems slightly increased from my last exam, no erythema or streaking. Neuro: no peripheral neuropathy. Otherwise nonfocal Skin without  ecchymosis, petechiae. Multiple intransit nodular areas of the porocarcinom dorsum left forearm to mid upper left arm, largest just above elbow and two on medial upper arm each ~ 2 cm diameter, no obvious bleeding. No recurrence at site that was irradiated at elbow. Right breast without dominant mass, skin or nipple findings. Left mastectomy scar not remarkable. No obvious adenopathy in left axilla, tho exam difficult. Right axilla not remarkable Portacath not tender, no erythema.  Lab Results:  Results for orders placed in visit on 02/01/14  CBC WITH DIFFERENTIAL      Result Value Ref Range   WBC 4.6  3.9 - 10.3 10e3/uL   NEUT# 3.3  1.5 - 6.5 10e3/uL   HGB 11.2 (*) 11.6 - 15.9 g/dL   HCT 32.8 (*) 34.8 - 46.6 %   Platelets  133 (*) 145 - 400 10e3/uL   MCV 94.1  79.5 - 101.0 fL   MCH 32.2  25.1 - 34.0 pg   MCHC 34.2  31.5 - 36.0 g/dL   RBC 3.48 (*) 3.70 - 5.45 10e6/uL   RDW 12.9  11.2 - 14.5 %   lymph# 0.8 (*) 0.9 - 3.3 10e3/uL   MONO# 0.4  0.1 - 0.9 10e3/uL   Eosinophils Absolute 0.1  0.0 - 0.5 10e3/uL   Basophils Absolute 0.0  0.0 - 0.1 10e3/uL   NEUT% 71.6  38.4 - 76.8 %   LYMPH% 16.5  14.0 - 49.7 %   MONO% 8.4  0.0 - 14.0 %   EOS% 2.6  0.0 - 7.0 %   BASO% 0.9  0.0 - 2.0 %  COMPREHENSIVE METABOLIC PANEL (BD53)      Result Value Ref Range   Sodium 140  136 - 145 mEq/L   Potassium 4.2  3.5 - 5.1 mEq/L   Chloride 103  98 - 109 mEq/L   CO2 29  22 - 29 mEq/L   Glucose 93  70 - 140 mg/dl   BUN 20.5  7.0 - 26.0 mg/dL   Creatinine 1.0  0.6 - 1.1 mg/dL   Total Bilirubin 1.32 (*) 0.20 - 1.20 mg/dL  Alkaline Phosphatase 42  40 - 150 U/L   AST 15  5 - 34 U/L   ALT 9  0 - 55 U/L   Total Protein 6.2 (*) 6.4 - 8.3 g/dL   Albumin 3.7  3.5 - 5.0 g/dL   Calcium 9.4  8.4 - 10.4 mg/dL   Anion Gap 9  3 - 11 mEq/L     Studies/Results:  No results found.  Medications: I have reviewed the patient's current medications.  DISCUSSION: Patient agrees to visit back to Dr Tammi Klippel, to consider some additional radiation perhaps to the largest areas on LUE.  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 She is having gradual local progression, but no symptoms that seem concerning for distant metastatic disease. With excellent, durable response of the one lesion treated with RT, I would appreciate Dr Tammi Klippel seeing her again for consideration of treatment of the larger areas present now. 2.Node positive left breast cancer not known recurrent: mammogram on right 04-2014  3.LUE lymphedema seems slightly moreso today, possibly with the gradual increase in porocarcinoma. Not bothersome to her now, follow.  4.IA ovarian cancer 2004. Clinically continues to do well in this regard.  5.flu  vaccine given  6.cardiomyopathy 2008  7.osteoporosis with vertebral compression fracture previously.  8.PAC in since attempt at chemotherapy in early 2014. Peripheral IV access is extremely poor such that PAC has been maintained.  25. Sister BRCA 2+. Referral made to genetics counselor now. She understands that the porocarcinoma is probably separate from that problem.  I will see her back in ~ 2 mo with PAC flush. TIme spent 25 min including >50% counseling and coordination of care.     Masami Plata P, MD   02/01/2014, 11:12 AM

## 2014-02-06 ENCOUNTER — Encounter: Payer: Self-pay | Admitting: Genetic Counselor

## 2014-02-06 ENCOUNTER — Ambulatory Visit (HOSPITAL_BASED_OUTPATIENT_CLINIC_OR_DEPARTMENT_OTHER): Payer: Medicare HMO | Admitting: Genetic Counselor

## 2014-02-06 ENCOUNTER — Other Ambulatory Visit (HOSPITAL_BASED_OUTPATIENT_CLINIC_OR_DEPARTMENT_OTHER): Payer: Medicare HMO

## 2014-02-06 DIAGNOSIS — Z8042 Family history of malignant neoplasm of prostate: Secondary | ICD-10-CM

## 2014-02-06 DIAGNOSIS — C4499 Other specified malignant neoplasm of skin, unspecified: Secondary | ICD-10-CM

## 2014-02-06 DIAGNOSIS — Z8543 Personal history of malignant neoplasm of ovary: Secondary | ICD-10-CM

## 2014-02-06 DIAGNOSIS — C44621 Squamous cell carcinoma of skin of unspecified upper limb, including shoulder: Secondary | ICD-10-CM

## 2014-02-06 DIAGNOSIS — Z803 Family history of malignant neoplasm of breast: Secondary | ICD-10-CM

## 2014-02-06 DIAGNOSIS — IMO0002 Reserved for concepts with insufficient information to code with codable children: Secondary | ICD-10-CM

## 2014-02-06 DIAGNOSIS — C569 Malignant neoplasm of unspecified ovary: Secondary | ICD-10-CM

## 2014-02-06 DIAGNOSIS — Z853 Personal history of malignant neoplasm of breast: Secondary | ICD-10-CM

## 2014-02-06 LAB — CBC WITH DIFFERENTIAL/PLATELET
BASO%: 0.8 % (ref 0.0–2.0)
BASOS ABS: 0 10*3/uL (ref 0.0–0.1)
EOS ABS: 0.2 10*3/uL (ref 0.0–0.5)
EOS%: 4.1 % (ref 0.0–7.0)
HEMATOCRIT: 32.9 % — AB (ref 34.8–46.6)
HGB: 11.1 g/dL — ABNORMAL LOW (ref 11.6–15.9)
LYMPH%: 26.6 % (ref 14.0–49.7)
MCH: 32.2 pg (ref 25.1–34.0)
MCHC: 33.8 g/dL (ref 31.5–36.0)
MCV: 95.4 fL (ref 79.5–101.0)
MONO#: 0.3 10*3/uL (ref 0.1–0.9)
MONO%: 9 % (ref 0.0–14.0)
NEUT%: 59.5 % (ref 38.4–76.8)
NEUTROS ABS: 2.2 10*3/uL (ref 1.5–6.5)
PLATELETS: 141 10*3/uL — AB (ref 145–400)
RBC: 3.45 10*6/uL — ABNORMAL LOW (ref 3.70–5.45)
RDW: 12.8 % (ref 11.2–14.5)
WBC: 3.7 10*3/uL — ABNORMAL LOW (ref 3.9–10.3)
lymph#: 1 10*3/uL (ref 0.9–3.3)

## 2014-02-06 LAB — COMPREHENSIVE METABOLIC PANEL (CC13)
ALBUMIN: 3.8 g/dL (ref 3.5–5.0)
ALT: 10 U/L (ref 0–55)
AST: 16 U/L (ref 5–34)
Alkaline Phosphatase: 50 U/L (ref 40–150)
Anion Gap: 8 mEq/L (ref 3–11)
BILIRUBIN TOTAL: 1.2 mg/dL (ref 0.20–1.20)
BUN: 23 mg/dL (ref 7.0–26.0)
CO2: 28 mEq/L (ref 22–29)
Calcium: 8.9 mg/dL (ref 8.4–10.4)
Chloride: 102 mEq/L (ref 98–109)
Creatinine: 1 mg/dL (ref 0.6–1.1)
GLUCOSE: 119 mg/dL (ref 70–140)
POTASSIUM: 3.8 meq/L (ref 3.5–5.1)
Sodium: 138 mEq/L (ref 136–145)
Total Protein: 6.4 g/dL (ref 6.4–8.3)

## 2014-02-06 NOTE — Progress Notes (Signed)
Dr.  Evlyn Clines requested a consultation for genetic counseling and risk assessment for Judy Herrera, a 80 y.o. female, for discussion of her personal history of breast and ovarian cancer and eccrine porocarcinoma.  She presents to clinic today to discuss the possibility of a genetic predisposition to cancer, and to further clarify her risks, as well as her family members' risks for cancer.   HISTORY OF PRESENT ILLNESS: In 1994, at the age of 64, Judy Herrera was diagnosed with breast cancer. In 2004, at the age of 26, Judy Herrera was diagnosed with ovarian cancer.  In 2014, she was diagnosed with a rare skin cancer called eccrine porocarcinoma.      Past Medical History  Diagnosis Date  . Hypertension   . Allergy   . Cancer   . Ovarian ca   . Cataract   . Breast CA   . CAD (coronary artery disease)   . Hyperlipidemia   . Cardiomyopathy     improved  . Arthritis   . Osteoporosis   . History of radiation therapy 1994    left chest wall  . History of chemotherapy     Past Surgical History  Procedure Laterality Date  . Left breast mastectomy  1994    T3 N1 Mx  . Shoulder arthroscopy    . Right leg    . Shoulder surgery      replacement  . Modified radical mastectomy w/ axillary lymph node dissection w/ pectoralis minor excision  1994    Left  . Eye surgery      CATARACTS  . Abdominal hysterectomy  2004  . Cholecystectomy  1993  . Breast surgery    . Skin biopsy  04/27/2011    left hand, forearm and posterior arm    History   Social History  . Marital Status: Widowed    Spouse Name: N/A    Number of Children: 1  . Years of Education: N/A   Social History Main Topics  . Smoking status: Never Smoker   . Smokeless tobacco: Never Used  . Alcohol Use: No  . Drug Use: No  . Sexual Activity: None     Comment: didn't ask   Other Topics Concern  . None   Social History Narrative  . None    REPRODUCTIVE HISTORY AND PERSONAL RISK ASSESSMENT  FACTORS: Menarche was at age 24.   postmenopausal Uterus Intact: no Ovaries Intact: no G1P1A0, first live birth at age 60  She has not previously undergone treatment for infertility.   Oral Contraceptive use: 0 years   She has used HRT in the past.    FAMILY HISTORY:  We obtained a detailed, 4-generation family history.  Significant diagnoses are listed below: Family History  Problem Relation Age of Onset  . Breast cancer Sister     dx in her 110s  . Heart disease Sister   . Heart disease Father   . Heart disease Mother   . Stroke Mother   . Prostate cancer Maternal Uncle   . Stroke Maternal Grandmother   . Testicular cancer Maternal Grandfather   . Stroke Maternal Grandfather   . Stroke Paternal Grandmother   . Heart disease Paternal Grandfather   . Breast cancer Sister 7    BRCA2+  . Breast cancer Sister 42  . Prostate cancer Maternal Uncle   . Breast cancer Other     maternal great aunt dx later in life    Patient's ancestors are of unknown  descent. There is no reported Ashkenazi Jewish ancestry. There is no known consanguinity.  GENETIC COUNSELING ASSESSMENT: [name] is a 80 y.o. female with a personal history of breast and ovarian cancer and a known family mutation in BRCA2 which is somewhat suggestive of a hereditary breast and ovarian cancer syndrome and predisposition to cancer. We, therefore, discussed and recommended the following at today's visit.   DISCUSSION: We reviewed the characteristics, features and inheritance patterns of hereditary cancer syndromes. We also discussed genetic testing, including the appropriate family members to test, the process of testing, insurance coverage and turn-around-time for results. We discussed that, in theory, she is at 50% risk for inheriting the BRCA mutation found in her sister.  However, with her diagnosis of breast and ovarian cancer, there is a very high likelihood that she has a mutation.  PLAN: After considering  the risks, benefits, and limitations, NICOLE DEFINO provided informed consent to pursue genetic testing and the blood sample will be sent to [facility] for analysis of the familial BRCA mutation. We discussed the implications of a positive, negative and/ or variant of uncertain significance genetic test result. Results should be available within approximately 1 weeks' time, at which point they will be disclosed by telephone to Judy Herrera, as will any additional recommendations warranted by these results. MICHEALE SCHLACK will receive a summary of her genetic counseling visit and a copy of her results once available. This information will also be available in Epic. We encouraged Judy Herrera to remain in contact with cancer genetics annually so that we can continuously update the family history and inform her of any changes in cancer genetics and testing that may be of benefit for her family. Judy Cousin Sesay's questions were answered to her satisfaction today. Our contact information was provided should additional questions or concerns arise.  The patient was seen for a total of 30 minutes, greater than 50% of which was spent face-to-face counseling.  This note will also be sent to the referring provider via the electronic medical record. The patient will be supplied with a summary of this genetic counseling discussion as well as educational information on the discussed hereditary cancer syndromes following the conclusion of their visit.   Patient was discussed with Dr. Marcy Panning.   _______________________________________________________________________ For Office Staff:  Number of people involved in session: 1 Was an Intern/ student involved with case: no

## 2014-02-07 ENCOUNTER — Telehealth: Payer: Self-pay | Admitting: Internal Medicine

## 2014-02-07 NOTE — Telephone Encounter (Signed)
[facility] [place] is requesting re-fill on traMADol (ULTRAM) 50 MG tablet

## 2014-02-08 MED ORDER — TRAMADOL HCL 50 MG PO TABS: 50.0000 mg | ORAL_TABLET | Freq: Four times a day (QID) | ORAL | Status: DC | PRN

## 2014-02-08 NOTE — Telephone Encounter (Signed)
Rx called in to pharmacy. 

## 2014-02-09 ENCOUNTER — Other Ambulatory Visit: Payer: Medicare Other

## 2014-02-09 ENCOUNTER — Ambulatory Visit: Payer: Medicare Other | Admitting: Oncology

## 2014-02-13 ENCOUNTER — Telehealth: Payer: Self-pay | Admitting: *Deleted

## 2014-02-13 NOTE — Telephone Encounter (Signed)
Called and left a message for the pt to return my call so I can reschedule her genetic appt.

## 2014-02-15 ENCOUNTER — Telehealth: Payer: Self-pay | Admitting: *Deleted

## 2014-02-15 NOTE — Telephone Encounter (Signed)
Pt returned my call and I confirmed her seeing Cat on 03/05/14 for genetics.

## 2014-02-16 ENCOUNTER — Encounter: Payer: Self-pay | Admitting: Radiation Oncology

## 2014-02-16 NOTE — Progress Notes (Signed)
Histology and Location of Primary Skin Cancer: porocarcinoma LUE  Patient presented to Dr. Allyson Sabal in 2012 with three left arm lesions and lymphedema  Past/Anticipated interventions by patient's surgeon/dermatologist for current problematic lesion, if any: re-excisions    SAFETY ISSUES:  Prior radiation? YES  Pacemaker/ICD? NO  Possible current pregnancy? NO  Is the patient on methotrexate? NO  Current Complaints / other details:  She did have good improvement in bothersome tumor nodule on left olecranon process with RT by Dr Tammi Klippel [phone], with 40 Gy in 10 fractions; the RT did not cause any significant worsening of baseline LUE lymphedema.   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 She is having gradual local progression, but no symptoms that seem concerning for distant metastatic disease. With excellent, durable response of the one lesion treated with RT, I would appreciate Dr Tammi Klippel seeing her again for consideration of treatment of the larger areas present now.

## 2014-02-18 ENCOUNTER — Encounter: Payer: Self-pay | Admitting: Radiation Oncology

## 2014-02-18 NOTE — Progress Notes (Signed)
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  She completed a course of 10 treatments to an olecranon lesion 9 months ago with good tolerance, and has two enlarging lesions along the extensor surface of the left upper arm.                              ALLERGIES:  is allergic to ibuprofen and morphine.  Meds: Current Outpatient Prescriptions  Medication Sig Dispense Refill  . amLODipine (NORVASC) 2.5 MG tablet TAKE 1 TABLET DAILY.  90 tablet  3  . aspirin EC 81 MG tablet Take 81 mg by mouth at bedtime.       . Calcium Carbonate-Vitamin D (CALCIUM + D PO) Take 600 mg by mouth daily after lunch.       . carvedilol (COREG) 25 MG tablet TAKE 1 TABLET 2 TIMES A DAY WITH FOOD.  180 tablet  1  . Cholecalciferol (VITAMIN D) 2000 UNITS tablet Take 2,000 Units by mouth daily.      . Coenzyme Q10 (CO Q 10) 100 MG CAPS Take 1 capsule by mouth daily.      Marland Kitchen denosumab (PROLIA) 60 MG/ML SOLN injection Inject 60 mg into the skin every 6 (six) months. Administer in upper arm, thigh, or abdomen      . ferrous gluconate (FERGON) 324 MG tablet Take 324 mg by mouth once a week. Takes 2 tabs on empty stomach with OJ on Monday.      Marland Kitchen lisinopril (PRINIVIL,ZESTRIL) 20 MG tablet Take 1 tablet (20 mg total) by mouth at bedtime.  90 tablet  3  . loperamide (IMODIUM) 2 MG capsule Take 1 capsule (2 mg total) by mouth as needed for  diarrhea or loose stools.  20 capsule  0  . simvastatin (ZOCOR) 40 MG tablet Take 1 tablet (40 mg total) by mouth at bedtime.  90 tablet  3  . traMADol (ULTRAM) 50 MG tablet Take 1 tablet (50 mg total) by mouth every 6 (six) hours as needed.  60 tablet  5  . acyclovir (ZOVIRAX) 400 MG tablet Take 0.5 tablets (200 mg total) by mouth 3 (three) times daily as needed (for fever blisters.).  45 tablet  3  . HYDROcodone-acetaminophen (NORCO/VICODIN) 5-325 MG per tablet Take 1 tablet by mouth every 6 (six) hours as needed for pain.       No current facility-administered medications for this encounter.    Physical Findings: The patient is in no acute distress. Patient is alert and oriented.  height is 5\' 2"  (1.575 m) and weight is 106 lb 8 oz (48.308 kg). Her oral temperature is 98 F (36.7 C). Her blood pressure is 123/61 and her pulse is 71. Her respiration is 16 and oxygen saturation is 100%. . Her treated cutaneous lesion on the left olecranon region is relatively flatter following radiation with no overt bleeding.  The left triceps are is notable for 2 enlarging lesions in being largely subcutaneous, but, both have the potential to create wound healing issues, in the event of skin erosion.  No significant changes.  Lab Findings: Lab Results  Component Value Date   WBC 3.7* 02/06/2014   HGB 11.1* 02/06/2014   HCT 32.9* 02/06/2014   MCV 95.4 02/06/2014   PLT 141* 02/06/2014    @LASTCHEM @  Radiographic Findings: No results found.  Impression:  The patient has two enlarging cutaneous lesions on the left upper arm amenable to localized palliative radiotherapy   Plan:   Today, I talked to the patient about the 2 enlarging nodules along the posterior left upper arm.  We discussed the natural history of disease and general treatment, highlighting the role or radiotherapy in the management.  We discussed the available radiation techniques, and focused on the details of logistics and delivery.  We  reviewed the anticipated acute and late sequelae associated with radiation in this setting.  The patient would like to proceed with radiation and will be scheduled for CT simulation.  I spent 20 minutes minutes face to face with the patient and more than 50% of that time was spent in counseling and/or coordination of care.  _____________________________________  Sheral Apley. Tammi Klippel, M.D.

## 2014-02-19 ENCOUNTER — Ambulatory Visit
Admission: RE | Admit: 2014-02-19 | Discharge: 2014-02-19 | Disposition: A | Discharge status: Home / Self Care | Payer: Medicare HMO | Source: Ambulatory Visit | Attending: Radiation Oncology | Admitting: Radiation Oncology

## 2014-02-19 ENCOUNTER — Encounter: Payer: Self-pay | Admitting: Radiation Oncology

## 2014-02-19 VITALS — BP 123/61 | HR 71 | Temp 98.0°F | Resp 16 | Ht 62.0 in | Wt 106.5 lb

## 2014-02-19 DIAGNOSIS — L989 Disorder of the skin and subcutaneous tissue, unspecified: Secondary | ICD-10-CM | POA: Insufficient documentation

## 2014-02-19 DIAGNOSIS — C4499 Other specified malignant neoplasm of skin, unspecified: Secondary | ICD-10-CM

## 2014-02-19 DIAGNOSIS — D2360 Other benign neoplasm of skin of unspecified upper limb, including shoulder: Secondary | ICD-10-CM | POA: Insufficient documentation

## 2014-02-19 DIAGNOSIS — Z79899 Other long term (current) drug therapy: Secondary | ICD-10-CM | POA: Insufficient documentation

## 2014-02-19 NOTE — Progress Notes (Signed)
Patient returning for treatment to LUE previous treated.

## 2014-02-19 NOTE — Progress Notes (Signed)
See progress note under physician encounter. 

## 2014-02-19 NOTE — Progress Notes (Addendum)
Two dime size lesions noted posterior left upper extremity above elbow. Denies these two lesions bleed. Multiple LUE porocarcinoma nodules noted.  Nodule of porocarcinoma just below olecranon has diminished leaving behind a hard flat palpable mass. Reports previously treated lesion bleed only yesterday after the scab fell off. Denies headache, dizziness, nausea, vomiting or diarrhea. Denies night sweats or weight loss. Denies pain. Willing to allow Dr. Tammi Klippel to treatment new lesions "if he believes this will help." Lymphedema of left upper extremity noted.

## 2014-02-23 ENCOUNTER — Ambulatory Visit
Admission: RE | Admit: 2014-02-23 | Discharge: 2014-02-23 | Disposition: A | Discharge status: Home / Self Care | Payer: Medicare HMO | Source: Ambulatory Visit | Attending: Radiation Oncology | Admitting: Radiation Oncology

## 2014-02-23 DIAGNOSIS — C44691 Other specified malignant neoplasm of skin of unspecified upper limb, including shoulder: Secondary | ICD-10-CM | POA: Insufficient documentation

## 2014-02-23 DIAGNOSIS — Z510 Encounter for antineoplastic radiation therapy: Secondary | ICD-10-CM | POA: Insufficient documentation

## 2014-02-23 DIAGNOSIS — C4499 Other specified malignant neoplasm of skin, unspecified: Secondary | ICD-10-CM

## 2014-02-25 NOTE — Progress Notes (Signed)
  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  Identity was confirmed.  All relevant records and images related to the planned course of therapy were reviewed.  The patient freely provided informed written consent to proceed with treatment after reviewing the details related to the planned course of therapy. The consent form was witnessed and verified by the simulation staff.  Then, the patient was set-up in a stable reproducible  prone and arms akimbo position for radiation therapy.  CT images were obtained.  Surface markings were placed.  The CT images were loaded into the planning software.  Then the target and avoidance structures were contoured.  Treatment planning then occurred.  The radiation prescription was entered and confirmed.  Then, I designed and supervised the construction of a total of three medically necessary complex treatment devices.  These included a body fix immobilization mold using the prone body positioner.  They also included and Accuform cushion beneath the left elbow and the electron block to shape electrons around the involved sites on the left arm while shielding the elbow joint. I have requested : Electron Aeronautical engineer.    PLAN:  The patient will receive 40 Gy in 10 fractions.  ________________________________  Sheral Apley Tammi Klippel, M.D.

## 2014-02-28 ENCOUNTER — Telehealth: Payer: Self-pay | Admitting: *Deleted

## 2014-02-28 NOTE — Telephone Encounter (Signed)
Received email from [facility] [place] that pt does not need the counseling to cancel that appt and to change the blood draw to 9am.  Did as she requested and emailed her to make her aware.

## 2014-03-01 ENCOUNTER — Telehealth: Payer: Self-pay | Admitting: *Deleted

## 2014-03-05 ENCOUNTER — Other Ambulatory Visit: Payer: Medicare HMO

## 2014-03-05 ENCOUNTER — Encounter: Payer: Self-pay | Admitting: Genetic Counselor

## 2014-03-05 ENCOUNTER — Ambulatory Visit
Admission: RE | Admit: 2014-03-05 | Discharge: 2014-03-05 | Disposition: A | Discharge status: Home / Self Care | Payer: Medicare HMO | Source: Ambulatory Visit | Attending: Radiation Oncology | Admitting: Radiation Oncology

## 2014-03-05 NOTE — Progress Notes (Signed)
Ms. Buckalew tested negative for the familial BRCA mutation identified in Ms. Ting family. Due to her history of breast an ovarian cancer, [facility] is planning to rerun this test on a new blood sample per our request. Ms. Uriostegui had her blood drawn today for this purpose. If the subsequent test is also negative, we have asked [facility] to reflex to a Breast/Ovarian cancer gene panel given Ms. Bayer history.

## 2014-03-06 ENCOUNTER — Ambulatory Visit
Admission: RE | Admit: 2014-03-06 | Discharge: 2014-03-06 | Disposition: A | Discharge status: Home / Self Care | Payer: Medicare HMO | Source: Ambulatory Visit | Attending: Radiation Oncology | Admitting: Radiation Oncology

## 2014-03-07 ENCOUNTER — Ambulatory Visit
Admission: RE | Admit: 2014-03-07 | Discharge: 2014-03-07 | Disposition: A | Discharge status: Home / Self Care | Payer: Medicare HMO | Source: Ambulatory Visit | Attending: Radiation Oncology | Admitting: Radiation Oncology

## 2014-03-08 ENCOUNTER — Ambulatory Visit
Admission: RE | Admit: 2014-03-08 | Discharge: 2014-03-08 | Disposition: A | Discharge status: Home / Self Care | Payer: Medicare HMO | Source: Ambulatory Visit | Attending: Radiation Oncology | Admitting: Radiation Oncology

## 2014-03-08 ENCOUNTER — Encounter: Payer: Self-pay | Admitting: Radiation Oncology

## 2014-03-08 VITALS — BP 131/60 | HR 69 | Temp 98.0°F | Wt 104.6 lb

## 2014-03-08 DIAGNOSIS — C4499 Other specified malignant neoplasm of skin, unspecified: Secondary | ICD-10-CM

## 2014-03-08 MED ORDER — RADIAPLEXRX EX GEL
Freq: Once | CUTANEOUS | Status: AC
  Administered 2014-03-08: 15:00:00 via TOPICAL

## 2014-03-08 NOTE — Progress Notes (Signed)
  Radiation Oncology         (336) [phone] ________________________________  Name: Judy Herrera MRN: [id number]  Date: 03/08/2014  DOB: Jan 10, 1931    Weekly Radiation Therapy Management  Current Dose: 16 Gy     Planned Dose:  40 Gy  Narrative . . . . . . . . The patient presents for routine under treatment assessment.                                   The patient is without complaint.                                 Set-up films were reviewed.                                 The chart was checked. Physical Findings. . .  weight is 104 lb 9.6 oz (47.446 kg). Her temperature is 98 F (36.7 C). Her blood pressure is 131/60 and her pulse is 69. . Weight essentially stable.  No significant changes. Impression . . . . . . . The patient is tolerating radiation. Plan . . . . . . . . . . . . Continue treatment as planned.  ________________________________  Sheral Apley. Tammi Klippel, M.D.

## 2014-03-08 NOTE — Progress Notes (Signed)
Patient for weekly assessment of radiation to left arm/tricep region.Completed 4 of 10 treatments.Denies pain.Reinforced side effects of treatment to include fatigue and skin discoloration.Will give tube of radiaplex to apply twice daily when needed and knows not to have on 2 to 4 hours prior to radiation.

## 2014-03-09 ENCOUNTER — Ambulatory Visit
Admission: RE | Admit: 2014-03-09 | Discharge: 2014-03-09 | Disposition: A | Discharge status: Home / Self Care | Payer: Medicare HMO | Source: Ambulatory Visit | Attending: Radiation Oncology | Admitting: Radiation Oncology

## 2014-03-10 ENCOUNTER — Encounter: Payer: Self-pay | Admitting: Emergency Medicine

## 2014-03-10 ENCOUNTER — Emergency Department
Admission: EM | Admit: 2014-03-10 | Discharge: 2014-03-10 | Disposition: A | Discharge status: Home / Self Care | Payer: Medicare HMO | Source: Home / Self Care | Attending: Family Medicine | Admitting: Family Medicine

## 2014-03-10 DIAGNOSIS — R300 Dysuria: Secondary | ICD-10-CM

## 2014-03-10 LAB — POCT URINALYSIS DIP (MANUAL ENTRY)
Blood, UA: NEGATIVE
Glucose, UA: 100
NITRITE UA: POSITIVE
Spec Grav, UA: 1.015 (ref 1.005–1.03)
UROBILINOGEN UA: 4 (ref 0–1)
pH, UA: 8.5 (ref 5–8)

## 2014-03-10 MED ORDER — CEPHALEXIN 500 MG PO CAPS: 500.0000 mg | ORAL_CAPSULE | Freq: Three times a day (TID) | ORAL | Status: DC

## 2014-03-10 NOTE — ED Notes (Addendum)
Patient reports onset of dysuria yesterday. Hx of frequent UTIs. Has taken AZO.

## 2014-03-10 NOTE — Discharge Instructions (Signed)

## 2014-03-10 NOTE — ED Provider Notes (Signed)
CSN: [id number]     Arrival date & time 03/10/14  1231 History   First MD Initiated Contact with Patient 03/10/14 1331     Chief Complaint  Patient presents with  . Dysuria      HPI Comments: Patient complains of onset yesterday of dysuria, uregency, and nocturia  Patient is a 80 y.o. female presenting with dysuria. The history is provided by the patient.  Dysuria Pain quality:  Burning Pain severity:  Mild Onset quality:  Sudden Duration:  1 day Timing:  Constant Progression:  Worsening Chronicity:  Recurrent Recent urinary tract infections: no   Relieved by:  Nothing Worsened by:  Nothing tried Ineffective treatments:  Phenazopyridine Urinary symptoms: frequent urination and hesitancy   Urinary symptoms: no discolored urine, no foul-smelling urine, no hematuria and no bladder incontinence   Associated symptoms: no abdominal pain, no fever, no flank pain, no nausea and no vomiting   Risk factors: recurrent urinary tract infections     Past Medical History  Diagnosis Date  . Hypertension   . Allergy   . Cancer   . Ovarian ca   . Cataract   . Breast CA   . CAD (coronary artery disease)   . Hyperlipidemia   . Cardiomyopathy     improved  . Arthritis   . Osteoporosis   . History of radiation therapy 1994    left chest wall  . History of chemotherapy   . Eccrine porocarcinoma of skin    Past Surgical History  Procedure Laterality Date  . Left breast mastectomy  1994    T3 N1 Mx  . Shoulder arthroscopy    . Right leg    . Shoulder surgery      replacement  . Modified radical mastectomy w/ axillary lymph node dissection w/ pectoralis minor excision  1994    Left  . Eye surgery      CATARACTS  . Abdominal hysterectomy  2004  . Cholecystectomy  1993  . Breast surgery    . Skin biopsy  04/27/2011    left hand, forearm and posterior arm   Family History  Problem Relation Age of Onset  . Breast cancer Sister     dx in her 15s  . Heart disease Sister   . Heart  disease Father   . Heart disease Mother   . Stroke Mother   . Prostate cancer Maternal Uncle   . Stroke Maternal Grandmother   . Testicular cancer Maternal Grandfather   . Stroke Maternal Grandfather   . Stroke Paternal Grandmother   . Heart disease Paternal Grandfather   . Breast cancer Sister 47    BRCA2+  . Breast cancer Sister 85  . Prostate cancer Maternal Uncle   . Breast cancer Other     maternal great aunt dx later in life   History  Substance Use Topics  . Smoking status: Never Smoker   . Smokeless tobacco: Never Used  . Alcohol Use: No   OB History   Grav Para Term Preterm Abortions TAB SAB Ect Mult Living                 Review of Systems  Constitutional: Negative for fever.  Gastrointestinal: Negative for nausea, vomiting and abdominal pain.  Genitourinary: Positive for dysuria. Negative for flank pain.  All other systems reviewed and are negative.   Allergies  Ibuprofen and Morphine  Home Medications   Prior to Admission medications   Medication Sig Start Date End Date  Taking? Authorizing Provider  acyclovir (ZOVIRAX) 400 MG tablet Take 0.5 tablets (200 mg total) by mouth 3 (three) times daily as needed (for fever blisters.). 09/20/13   Marletta Lor, MD  amLODipine (NORVASC) 2.5 MG tablet TAKE 1 TABLET DAILY. 07/19/13   Marletta Lor, MD  aspirin EC 81 MG tablet Take 81 mg by mouth at bedtime.     Historical Provider, MD  Calcium Carbonate-Vitamin D (CALCIUM + D PO) Take 600 mg by mouth daily after lunch.     Historical Provider, MD  carvedilol (COREG) 25 MG tablet TAKE 1 TABLET 2 TIMES A DAY WITH FOOD. 01/29/14   Marletta Lor, MD  Cholecalciferol (VITAMIN D) 2000 UNITS tablet Take 2,000 Units by mouth daily.    Historical Provider, MD  Coenzyme Q10 (CO Q 10) 100 MG CAPS Take 1 capsule by mouth daily.    Historical Provider, MD  denosumab (PROLIA) 60 MG/ML SOLN injection Inject 60 mg into the skin every 6 (six) months. Administer in upper  arm, thigh, or abdomen    Historical Provider, MD  ferrous gluconate (FERGON) 324 MG tablet Take 324 mg by mouth once a week. Takes 2 tabs on empty stomach with OJ on Monday. 04/01/12   Lennis Marion Downer, MD  HYDROcodone-acetaminophen (NORCO/VICODIN) 5-325 MG per tablet Take 1 tablet by mouth every 6 (six) hours as needed for pain.    Historical Provider, MD  lisinopril (PRINIVIL,ZESTRIL) 20 MG tablet Take 1 tablet (20 mg total) by mouth at bedtime. 07/19/13   Marletta Lor, MD  loperamide (IMODIUM) 2 MG capsule Take 1 capsule (2 mg total) by mouth as needed for diarrhea or loose stools. 01/24/13   Lennis Marion Downer, MD  simvastatin (ZOCOR) 40 MG tablet Take 1 tablet (40 mg total) by mouth at bedtime. 07/19/13   Marletta Lor, MD  traMADol (ULTRAM) 50 MG tablet Take 1 tablet (50 mg total) by mouth every 6 (six) hours as needed. 02/08/14   Marletta Lor, MD   BP 128/68  Pulse 71  Temp(Src) 97.5 F (36.4 C) (Oral)  Resp 16  Ht 5' 2"  (1.575 m)  Wt 105 lb (47.628 kg)  BMI 19.20 kg/m2  SpO2 97% Physical Exam Nursing notes and Vital Signs reviewed. Appearance:  Patient appears healthy, stated age, and in no acute distress Eyes:  Pupils are equal, round, and reactive to light and accomodation.  Extraocular movement is intact.  Conjunctivae are not inflamed  Nose:   Normal turbinates.    Mouth/Pharynx:  Normal; moist mucous membranes  Neck:  Supple.  No adenopathy Lungs:  Clear to auscultation.  Breath sounds are equal.  Heart:  Regular rate and rhythm without murmurs, rubs, or gallops.  Abdomen:  Nontender without masses or hepatosplenomegaly.  Bowel sounds are present.  No CVA or flank tenderness.  Extremities:  No edema.  No calf tenderness Skin:  No rash present.   ED Course  Procedures  none    Labs Reviewed  URINE CULTURE  POCT URINALYSIS DIP (MANUAL ENTRY):  GLU 120m/dL; BIL small; KET trace; PRO 1060mdL; URO 4.0 E.U./dL NIT positive; LEU large         MDM   1.  Dysuria    Urine culture pending.  Begin Keflex.  May contiue AZO. Followup with urologist if symptoms persist.  Kandra Nicolas, MD 03/16/14 [phone]

## 2014-03-12 ENCOUNTER — Ambulatory Visit
Admission: RE | Admit: 2014-03-12 | Discharge: 2014-03-12 | Disposition: A | Discharge status: Home / Self Care | Payer: Medicare HMO | Source: Ambulatory Visit | Attending: Radiation Oncology | Admitting: Radiation Oncology

## 2014-03-12 LAB — URINE CULTURE

## 2014-03-13 ENCOUNTER — Ambulatory Visit: Payer: Medicare HMO

## 2014-03-14 ENCOUNTER — Ambulatory Visit
Admission: RE | Admit: 2014-03-14 | Discharge: 2014-03-14 | Disposition: A | Discharge status: Home / Self Care | Payer: Medicare HMO | Source: Ambulatory Visit | Attending: Radiation Oncology | Admitting: Radiation Oncology

## 2014-03-15 ENCOUNTER — Ambulatory Visit
Admission: RE | Admit: 2014-03-15 | Discharge: 2014-03-15 | Disposition: A | Discharge status: Home / Self Care | Payer: Medicare HMO | Source: Ambulatory Visit | Attending: Radiation Oncology | Admitting: Radiation Oncology

## 2014-03-16 ENCOUNTER — Encounter: Payer: Self-pay | Admitting: Radiation Oncology

## 2014-03-16 ENCOUNTER — Ambulatory Visit: Payer: Medicare HMO

## 2014-03-16 ENCOUNTER — Ambulatory Visit
Admission: RE | Admit: 2014-03-16 | Discharge: 2014-03-16 | Disposition: A | Discharge status: Home / Self Care | Payer: Medicare HMO | Source: Ambulatory Visit | Attending: Radiation Oncology | Admitting: Radiation Oncology

## 2014-03-16 VITALS — BP 115/56 | HR 71 | Temp 98.5°F | Resp 20 | Wt 103.7 lb

## 2014-03-16 DIAGNOSIS — C4499 Other specified malignant neoplasm of skin, unspecified: Secondary | ICD-10-CM

## 2014-03-16 NOTE — Progress Notes (Signed)
  Radiation Oncology         (336) [phone] ________________________________  Name: Judy Herrera MRN: [id number]  Date: 03/16/2014  DOB: Jul 05, 1931  Weekly Radiation Therapy Management     Eccrine porocarcinoma of skin   04/01/2012 Initial Diagnosis Eccrine porocarcinoma of skin   Current Dose: 36 Gy     Planned Dose:  40 Gy  Narrative . . . . . . . . The patient presents for routine under treatment assessment after her penultimate fraction.                                   The patient is without complaint.                                 Set-up films were reviewed.                                 The chart was checked. Physical Findings. . .  weight is 103 lb 11.2 oz (47.038 kg). Her oral temperature is 98.5 F (36.9 C). Her blood pressure is 115/56 and her pulse is 71. Her respiration is 20. . Weight essentially stable.  No significant changes. Impression . . . . . . . The patient is tolerating radiation. Plan . . . . . . . . . . . . Continue treatment as planned.  ________________________________  Sheral Apley. Tammi Klippel, M.D.

## 2014-03-16 NOTE — Progress Notes (Signed)
Weekly rad txs 9/10 lt triceop, slight swelling just below elbow, stated"its gotten a little more swollen since taking rad txs",  No skin changes, no c/o pain, appetite good,  11:14 AM

## 2014-03-19 ENCOUNTER — Ambulatory Visit
Admission: RE | Admit: 2014-03-19 | Discharge: 2014-03-19 | Disposition: A | Discharge status: Home / Self Care | Payer: Medicare HMO | Source: Ambulatory Visit | Attending: Radiation Oncology | Admitting: Radiation Oncology

## 2014-03-19 ENCOUNTER — Encounter: Payer: Self-pay | Admitting: Radiation Oncology

## 2014-03-19 ENCOUNTER — Ambulatory Visit: Payer: Medicare HMO

## 2014-03-21 NOTE — Progress Notes (Signed)
  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  Beams/energy:   The patient was setup with a body fix immobilization mold using the prone body positioner. We constructed an Accuform cushion beneath the left elbow and then built an electron block to shape electrons around the involved sites on the left arm while shielding the elbow joint. The radiation dose was calculated using monte Carlo planning to deliver 9 MeV electrons covering the cutaneous nodules.   Narrative: The patient tolerated radiation treatment relatively well.   She had some minor edema near the radiation field but no significant dermatitis upon completion.  Plan: The patient has completed radiation treatment. The patient will return to radiation oncology clinic for routine followup in one month. I advised them to call or return sooner if they have any questions or concerns related to their recovery or treatment. ________________________________  Sheral Apley. Tammi Klippel, M.D.

## 2014-03-25 ENCOUNTER — Other Ambulatory Visit: Payer: Self-pay | Admitting: Oncology

## 2014-03-28 ENCOUNTER — Other Ambulatory Visit (HOSPITAL_BASED_OUTPATIENT_CLINIC_OR_DEPARTMENT_OTHER): Payer: Medicare HMO

## 2014-03-28 ENCOUNTER — Ambulatory Visit (HOSPITAL_BASED_OUTPATIENT_CLINIC_OR_DEPARTMENT_OTHER): Payer: Medicare HMO | Admitting: Oncology

## 2014-03-28 ENCOUNTER — Encounter: Payer: Self-pay | Admitting: Oncology

## 2014-03-28 ENCOUNTER — Ambulatory Visit (HOSPITAL_BASED_OUTPATIENT_CLINIC_OR_DEPARTMENT_OTHER): Payer: Medicare HMO

## 2014-03-28 ENCOUNTER — Telehealth: Payer: Self-pay | Admitting: Oncology

## 2014-03-28 VITALS — BP 135/57 | HR 77 | Temp 98.2°F | Resp 18 | Ht 62.0 in | Wt 104.7 lb

## 2014-03-28 DIAGNOSIS — C44621 Squamous cell carcinoma of skin of unspecified upper limb, including shoulder: Secondary | ICD-10-CM

## 2014-03-28 DIAGNOSIS — C569 Malignant neoplasm of unspecified ovary: Secondary | ICD-10-CM

## 2014-03-28 DIAGNOSIS — M810 Age-related osteoporosis without current pathological fracture: Secondary | ICD-10-CM

## 2014-03-28 DIAGNOSIS — C4499 Other specified malignant neoplasm of skin, unspecified: Secondary | ICD-10-CM

## 2014-03-28 DIAGNOSIS — Z8543 Personal history of malignant neoplasm of ovary: Secondary | ICD-10-CM

## 2014-03-28 DIAGNOSIS — Z853 Personal history of malignant neoplasm of breast: Secondary | ICD-10-CM

## 2014-03-28 DIAGNOSIS — Z95828 Presence of other vascular implants and grafts: Secondary | ICD-10-CM

## 2014-03-28 DIAGNOSIS — Z1231 Encounter for screening mammogram for malignant neoplasm of breast: Secondary | ICD-10-CM

## 2014-03-28 DIAGNOSIS — I890 Lymphedema, not elsewhere classified: Secondary | ICD-10-CM

## 2014-03-28 LAB — CBC WITH DIFFERENTIAL/PLATELET
BASO%: 0.8 % (ref 0.0–2.0)
Basophils Absolute: 0 10*3/uL (ref 0.0–0.1)
EOS ABS: 0.1 10*3/uL (ref 0.0–0.5)
EOS%: 2.5 % (ref 0.0–7.0)
HEMATOCRIT: 32.4 % — AB (ref 34.8–46.6)
HGB: 11 g/dL — ABNORMAL LOW (ref 11.6–15.9)
LYMPH#: 0.6 10*3/uL — AB (ref 0.9–3.3)
LYMPH%: 14.5 % (ref 14.0–49.7)
MCH: 32.2 pg (ref 25.1–34.0)
MCHC: 33.8 g/dL (ref 31.5–36.0)
MCV: 95.3 fL (ref 79.5–101.0)
MONO#: 0.4 10*3/uL (ref 0.1–0.9)
MONO%: 7.9 % (ref 0.0–14.0)
NEUT%: 74.3 % (ref 38.4–76.8)
NEUTROS ABS: 3.3 10*3/uL (ref 1.5–6.5)
PLATELETS: 144 10*3/uL — AB (ref 145–400)
RBC: 3.4 10*6/uL — ABNORMAL LOW (ref 3.70–5.45)
RDW: 13.2 % (ref 11.2–14.5)
WBC: 4.5 10*3/uL (ref 3.9–10.3)

## 2014-03-28 LAB — COMPREHENSIVE METABOLIC PANEL (CC13)
ALBUMIN: 3.6 g/dL (ref 3.5–5.0)
ALT: 13 U/L (ref 0–55)
AST: 15 U/L (ref 5–34)
Alkaline Phosphatase: 45 U/L (ref 40–150)
Anion Gap: 11 mEq/L (ref 3–11)
BILIRUBIN TOTAL: 1.12 mg/dL (ref 0.20–1.20)
BUN: 19.2 mg/dL (ref 7.0–26.0)
CO2: 25 mEq/L (ref 22–29)
CREATININE: 1 mg/dL (ref 0.6–1.1)
Calcium: 9.4 mg/dL (ref 8.4–10.4)
Chloride: 103 mEq/L (ref 98–109)
Glucose: 92 mg/dl (ref 70–140)
Potassium: 4.2 mEq/L (ref 3.5–5.1)
Sodium: 140 mEq/L (ref 136–145)
Total Protein: 6.2 g/dL — ABNORMAL LOW (ref 6.4–8.3)

## 2014-03-28 LAB — CA 125: CA 125: 9.4 U/mL (ref 0.0–30.2)

## 2014-03-28 MED ORDER — HEPARIN SOD (PORK) LOCK FLUSH 100 UNIT/ML IV SOLN
500.0000 [IU] | Freq: Once | INTRAVENOUS | Status: AC
  Administered 2014-03-28: 500 [IU] via INTRAVENOUS
  Filled 2014-03-28: qty 5

## 2014-03-28 MED ORDER — SODIUM CHLORIDE 0.9 % IJ SOLN
10.0000 mL | INTRAMUSCULAR | Status: DC | PRN
  Administered 2014-03-28: 10 mL via INTRAVENOUS
  Filled 2014-03-28: qty 10

## 2014-03-28 NOTE — Telephone Encounter (Signed)
, °

## 2014-03-28 NOTE — Progress Notes (Signed)
OFFICE PROGRESS NOTE   03/28/2014   Physicians:P.Kwaitkowski, M.Manning, P.Lindaann Pascal, F.Lupton, B.Crenshaw, M.Martin, [facility], D.ClarkePearson.   INTERVAL HISTORY:  Patient is seen, alone for visit, in continuing attention to in transit metastatic porocarcinoma LUE, also history of left breast cancer and ovarian cancer.   She had additional radiation 4-20 thru 03-19-14  by Dr Tammi Klippel to symptomatic area of the porocarcinoma just above left elbow; she sees Dr Tammi Klippel in follow up on 04-19-14. She has more swelling in LUE since the recent RT, as happened to the underlying lymphedema with radiation on arm in 05-2013. She has noticed a new firm nodule on chest wall at left anterior axillary fold in past week.  Note patient has not wanted further chemotherapy for the porocarcinoma since extremely difficult time with single treatent of gemzar taxotere in Feb 2014, tho there was some improvement in the LUE lesions after that treatment.   She had attempted [place] left axilla 11-2013 because of question of axillary node, difficult study due to chronic very limited mobility left shoulder. Last right mammogram was at [facility] 05-12-13, with extremely dense breast tissue. Last CT CAP was 04-2013.  She is to see Dr Josephina Shih next 04-27-14, on yearly follow up there.  Patient had genetics testing done, as strong family history of breast and ovarian cancer and one sister recently found BRCA 2 positive. Unexpectedly, Mrs Koehn did not have the familial  BRCA59mtation, (confirmed by second specimen), tho she does have variant of uncertain significance ([facility] c.632C>G). Her other family members with cancers have not been tested.   She has PAC, flushed today. This was placed for planned chemo and has been maintained due to very poor peripheral access.   ONCOLOGIC HISTORY   Eccrine porocarcinoma of skin   04/01/2012 Initial Diagnosis Eccrine porocarcinoma of skin  Patient was diagnosed with  porocarcinoma involving 3 areas left hand and arm in spring 2012 by Dr.Lupton. She had re-excisions of all areas (dorsum left hand, 2 on left forearm and left arm) by Dr ERonnette Hila She had significant lymphedema LUE after those procedures, with history of left breast cancer with 20 node left axillary dissection in 1994 followed by radiation. [facility] had no distant disease. With progressive in transit mets, [facility] and excision of an area on dorsum of left hand in Oct 2013. She had significant swelling of LUE after the limb perfusion, which gradually improved, and progression of the in transit mets LUE after the limb perfusion. [facility]/taxotere as systemic sarcoma regimen, chosen in part due to her prior chemotherapy. [facility] She had single cycle of dose reduced gemcitabine taxotere Feb 2014, tolerated very poorly, with hospitalization due to pancytopenia despite neulasta, requiring transfusions of PRBCs and platelets, as well as severe diarrhea and related decrease in nutritional status and general debility. PS took several months to improve back to baseline after that attempt at chemotherapy. She has not wanted any further attempt at chemotherapy for the porocarcinoma to this point. She did have good improvement in bothersome tumor nodule on left olecranon process with RT by Dr MTammi Klippel7-2014, with 40 Gy in 10 fractions. She had additional RT to symptomatic area of the porocarcinoma 4-20 thru 03-19-14.  She had left breast cancer T3N1 (2 of 20 nodes) ER+ in 1994, treated with mastectomy and  axillary node dissection, adriamycin/cytoxan x4, RT, and tamoxifen x 5 years. Last right mammogram was in [facility] facility 05-12-13, with extremely dense breast tissue (did not have 3D tomo, tho this would be  appropriate with the dense tissue).  She had IA clear cell ovarian cancer in 2004, treated with surgery followed by 3 cycles of taxol/ carboplatin. She saw Dr Josephina Shih [phone] and is to see him again in a year. She has had no known recurrence of the gyn cancer.   Review of systems as above, also: Appetite fine. Denies SOB, fever, problems with bowels. Chronic back pain is at baseline, worse when she stands as it has been. No bleeding. No fever or symptoms of infection. No problems with PAC. No increased discomfort LUE, no actual pain at new nodule lateral left chest wall. Remainder of 10 point Review of Systems negative.  Objective:  Vital signs in last 24 hours:  BP 135/57  Pulse 77  Temp(Src) 98.2 F (36.8 C) (Oral)  Resp 18  Ht _0  (1.575 m)  Wt 104 lb 11.2 oz (47.492 kg)  BMI 19.15 kg/m2  SpO2 99% Weight is up ~ one lb. Thin, elderly lady, alert, oriented and appropriate, very pleasant and very reluctant to voice complaints as always. Ambulatory without assistance. Respirations not labored RA.   HEENT:PERRL, sclerae not icteric. Oral mucosa moist without lesions, posterior pharynx clear.  Neck supple. No JVD.  Lymphatics:no cervical,suraclavicular adenopathy Resp: clear to auscultation bilaterally and normal percussion bilaterally Cardio: regular rate and rhythm. No gallop. GI: soft, nontender, not distended, no mass or organomegaly appreciated on exam done in chair due to chronic back probems. Normally active bowel sounds. Surgical incision not remarkable. Musculoskeletal/ Extremities: LUE with increased swelling above recent baseline, not tight. Lesions of porocarcinoma all larger than when I saw her last, including area at olecranon previously irradiated, but none with drainage or significant surrounding erythema, all firm and nodular. RUE and bilateral LE without pitting edema, cords, tenderness Neuro: no peripheral neuropathy. Otherwise nonfocal Skin without rash,  ecchymosis, petechiae Breasts:Right without dominant mass, skin or nipple findings, right axilla benign and no swelling RUE. Left mastectomy scar not remarkable. Firm nodular area ~ 1.5 cm at left anterior axillary fold, new, not markedly tender. Portacath-without erythema or tenderness, flushed today.  Lab Results:  Results for orders placed in visit on 03/28/14  COMPREHENSIVE METABOLIC PANEL (QM08)      Result Value Ref Range   Sodium 140  136 - 145 mEq/L   Potassium 4.2  3.5 - 5.1 mEq/L   Chloride 103  98 - 109 mEq/L   CO2 25  22 - 29 mEq/L   Glucose 92  70 - 140 mg/dl   BUN 19.2  7.0 - 26.0 mg/dL   Creatinine 1.0  0.6 - 1.1 mg/dL   Total Bilirubin 1.12  0.20 - 1.20 mg/dL   Alkaline Phosphatase 45  40 - 150 U/L   AST 15  5 - 34 U/L   ALT 13  0 - 55 U/L   Total Protein 6.2 (*) 6.4 - 8.3 g/dL   Albumin 3.6  3.5 - 5.0 g/dL   Calcium 9.4  8.4 - 10.4 mg/dL   Anion Gap 11  3 - 11 mEq/L  CBC WITH DIFFERENTIAL      Result Value Ref Range   WBC 4.5  3.9 - 10.3 10e3/uL   NEUT# 3.3  1.5 - 6.5 10e3/uL   HGB 11.0 (*) 11.6 - 15.9  g/dL   HCT 32.4 (*) 34.8 - 46.6 %   Platelets 144 (*) 145 - 400 10e3/uL   MCV 95.3  79.5 - 101.0 fL   MCH 32.2  25.1 - 34.0 pg   MCHC 33.8  31.5 - 36.0 g/dL   RBC 3.40 (*) 3.70 - 5.45 10e6/uL   RDW 13.2  11.2 - 14.5 %   lymph# 0.6 (*) 0.9 - 3.3 10e3/uL   MONO# 0.4  0.1 - 0.9 10e3/uL   Eosinophils Absolute 0.1  0.0 - 0.5 10e3/uL   Basophils Absolute 0.0  0.0 - 0.1 10e3/uL   NEUT% 74.3  38.4 - 76.8 %   LYMPH% 14.5  14.0 - 49.7 %   MONO% 7.9  0.0 - 14.0 %   EOS% 2.5  0.0 - 7.0 %   BASO% 0.8  0.0 - 2.0 %     Studies/Results:  No results found.  Medications: I have reviewed the patient's current medications.  DISCUSSION: new area left chest wall clinically malignant, and with progression of the porocarcinoma lesions this is likely that malignancy, however with history of T3N1 left breast cancer, I have suggested we ask Dr Hassell Done if needle biopsy to  confirm path is possible. With previous radiation, LUE lymphedema and orthopedic problems with left shoulder, I would not want to put her thru excision of this nodule. If it happens to be from breast, that would allow consideration of other treatment options.  I spoke directly with Dr Hassell Done during patient's visit. He is glad to see her and to consider core needle biopsy -- thank you.  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 Significant local progression, post additional directed RT by Dr Tammi Klippel to one area. Probable extension to left lateral chest but will try to confirm path as above. I do not know that she would consider even very cautious attempt at further chemotherapy, which would be in palliative attempt if so. She may prefer no aggressive intervention, which would also be an appropriate choice with overall situation. I will see her in follow up after Dr Hassell Done. 2.T3N1 left breast cancer: as above. mammogram on right 04-2014  3.LUE lymphedema increased since recent RT and likely also with the progressive porocarcinoma 4.IA ovarian cancer 2004. No known recurrent disease. Has yearly visit with Dr Josephina Shih on 6-12, and I will let him know situation otherwise. 5.flu vaccine given  6.cardiomyopathy 2008  7.osteoporosis with vertebral compression fracture previously.  8.PAC in since attempt at chemotherapy in early 2014. Peripheral IV access is extremely poor such that PAC has been maintained.  51. Sister BRCA 2+. Patient does not have that same mutation   Patient in agreement with plan above.Time spent 25 min including >50% counseling and coordination of care     Gordy Levan, MD   03/28/2014, 9:22 AM

## 2014-03-28 NOTE — Patient Instructions (Signed)

## 2014-03-29 ENCOUNTER — Encounter: Payer: Self-pay | Admitting: Genetic Counselor

## 2014-03-29 DIAGNOSIS — Z853 Personal history of malignant neoplasm of breast: Secondary | ICD-10-CM

## 2014-03-29 DIAGNOSIS — Z86010 Personal history of colonic polyps: Secondary | ICD-10-CM

## 2014-03-29 DIAGNOSIS — C4499 Other specified malignant neoplasm of skin, unspecified: Secondary | ICD-10-CM

## 2014-03-29 NOTE — Progress Notes (Signed)
HPI:  Judy Herrera was previously seen in the [place] clinic due to a personal and family history of cancer and concerns regarding a hereditary predisposition to cancer. Please refer to our prior cancer genetics clinic note for more information regarding Judy Herrera medical, social and family histories, and our assessment and recommendations, at the time. Judy Herrera recent genetic test results were disclosed to her, as were recommendations warranted by these results. These results and recommendations are discussed in more detail below.  GENETIC TEST RESULTS: At the time of Judy Herrera visit, we recommended she pursue genetic testing of the familial BRCA2 gene mutation previously identified in the family (called, BRCA2, [phone]). Genetic testing, performed at [facility] was normal, and did not reveal the familial BRCA2 mutation. [facility] also performed site-specific analysis testing on a new blood sample from Judy Herrera to again look for the familial mutation, and this test was also normal, confirming that Judy Herrera does not have the familial BRCA2 gene mutation.   Genetic testing was subsequently ordered for the Breast/Ovarian cancer gene panel, given Judy Herrera personal and family histories of cancer. This test, which included sequencing and deletion/duplication analysis of the genes on the  Breast/Ovarian cancer gene panel (all of which are listed on the test report), was performed at [facility]. This test was negative for pathogenic mutations in any of these genes on the panel. Genetic testing did identify a variant of uncertain significance called FANCC, c.632C>G. At this time, it is unknown if this variant is associated with increased risk of cancer or if this is a normal finding, thus we would not change medical management based on this variant. With time, we suspect the lab will reclassify this variant and we will try to recontact Judy Herrera, at that time, to  discuss the reclassification further.   We discussed with Judy Herrera that since the current genetic testing is not perfect, it is possible there may be a pathogenic gene mutation in one of these genes that current testing cannot detect, but that chance is small.  We also discussed, that it is possible that another gene that has not yet been discovered, or that we have not yet tested, is responsible for the cancer diagnoses in the family, and it is, therefore, important to remain in touch with cancer genetics in the future so that we can continue to offer Judy Herrera the most up to date genetic testing.   CANCER SCREENING RECOMMENDATIONS: This result is generally reassuring and suggests that Judy Herrera cancer was most likely not due to an inherited predisposition associated with one of these genes.  Most cancers happen by chance and this negative test, along with details of her family history, suggests that her cancer falls into this category.  We, therefore, recommended she continue to follow the cancer management and screening guidelines provided by her primary providers.   RECOMMENDATIONS FOR FAMILY MEMBERS:  Even though Judy Herrera did not inherit the familial BRCA gene mutation, others in the family may have.  It is very important all of Judy Herrera relatives (female and female) know of the presence of this mutation and that they may be at increased risk for cancer.  Genetic testing can sort out who in your family is at risk and who is not.  Please let us know if we can help facilitate testing. Genetic counselors can be located in other cities, by visiting the website of the [employer] ([employer]ist[employer].se) and Field seismologist for a  genetic counselor by zip code.  FOLLOW-UP: Lastly, we discussed with Judy Herrera that cancer genetics is a rapidly advancing field and it is possible that new genetic tests will be appropriate for her and/or her family members in the future. We encouraged  her to remain in contact with cancer genetics on an annual basis so we can update her personal and family histories and let her know of advances in cancer genetics that may benefit this family.   Our contact number was provided. Judy Herrera questions were answered to her satisfaction, and she knows she is welcome to call us at anytime with additional questions or concerns. This patient was discussed with Dr. Humphrey Rolls who agrees with the above.   Catherine A. Fine, MS, CGC Certified Genetic Counseor phone: [phone] [email or web address]

## 2014-04-04 ENCOUNTER — Encounter: Payer: Self-pay | Admitting: Cardiology

## 2014-04-04 ENCOUNTER — Telehealth (INDEPENDENT_AMBULATORY_CARE_PROVIDER_SITE_OTHER): Payer: Self-pay

## 2014-04-04 ENCOUNTER — Ambulatory Visit (INDEPENDENT_AMBULATORY_CARE_PROVIDER_SITE_OTHER): Payer: Medicare HMO | Admitting: Cardiology

## 2014-04-04 VITALS — BP 122/70 | HR 77 | Ht 60.0 in | Wt 104.0 lb

## 2014-04-04 DIAGNOSIS — I428 Other cardiomyopathies: Secondary | ICD-10-CM

## 2014-04-04 DIAGNOSIS — E785 Hyperlipidemia, unspecified: Secondary | ICD-10-CM

## 2014-04-04 DIAGNOSIS — I10 Essential (primary) hypertension: Secondary | ICD-10-CM

## 2014-04-04 DIAGNOSIS — I2510 Atherosclerotic heart disease of native coronary artery without angina pectoris: Secondary | ICD-10-CM

## 2014-04-04 NOTE — Patient Instructions (Signed)
Your physician wants you to follow-up in: ONE YEAR WITH DR CRENSHAW You will receive a reminder letter in the mail two months in advance. If you don't receive a letter, please call our office to schedule the follow-up appointment.  

## 2014-04-04 NOTE — Telephone Encounter (Signed)
Called patient to confirm appointment for 04/05/14 @ 10:00 am w/Dr. Hassell Done.  Patient verbalized she's aware of appointment date & time.

## 2014-04-04 NOTE — Progress Notes (Signed)
HPI: FU cardiomyopathy out of proportion to coronary artery disease. She did have a cardiac catheterization performed on August 18, 2005. At that time, she had a 60% OM, but otherwise nonobstructive coronary artery disease. Ejection fraction was 45%. The last echocardiogram in May of 2014 showed an ejection fraction of 34%, grade 1 diastolic dysfunction, mild aortic and mitral regurgitation and mildly elevated pulmonary pressures. I last saw her in May of 2013. Since then, the patient has dyspnea with more extreme activities but not with routine activities. It is relieved with rest. It is not associated with chest pain. There is no orthopnea, PND or pedal edema. There is no syncope or palpitations. There is no exertional chest pain.    Current Outpatient Prescriptions  Medication Sig Dispense Refill  . acyclovir (ZOVIRAX) 400 MG tablet Take 0.5 tablets (200 mg total) by mouth 3 (three) times daily as needed (for fever blisters.).  45 tablet  3  . amLODipine (NORVASC) 2.5 MG tablet TAKE 1 TABLET DAILY.  90 tablet  3  . aspirin EC 81 MG tablet Take 81 mg by mouth at bedtime.       . Calcium Carbonate-Vitamin D (CALCIUM + D PO) Take 600 mg by mouth daily after lunch.       . carvedilol (COREG) 25 MG tablet TAKE 1 TABLET 2 TIMES A DAY WITH FOOD.  180 tablet  1  . Cholecalciferol (VITAMIN D) 2000 UNITS tablet Take 2,000 Units by mouth daily.      . Coenzyme Q10 (CO Q 10) 100 MG CAPS Take 1 capsule by mouth daily.      Marland Kitchen denosumab (PROLIA) 60 MG/ML SOLN injection Inject 60 mg into the skin every 6 (six) months. Administer in upper arm, thigh, or abdomen      . ferrous gluconate (FERGON) 324 MG tablet Take 324 mg by mouth once a week. Takes 2 tabs on empty stomach with OJ on Monday.      Marland Kitchen HYDROcodone-acetaminophen (NORCO/VICODIN) 5-325 MG per tablet Take 1 tablet by mouth every 6 (six) hours as needed for pain.      Marland Kitchen lisinopril (PRINIVIL,ZESTRIL) 20 MG tablet Take 1 tablet (20 mg total) by mouth  at bedtime.  90 tablet  3  . loperamide (IMODIUM) 2 MG capsule Take 1 capsule (2 mg total) by mouth as needed for diarrhea or loose stools.  20 capsule  0  . simvastatin (ZOCOR) 40 MG tablet Take 1 tablet (40 mg total) by mouth at bedtime.  90 tablet  3  . traMADol (ULTRAM) 50 MG tablet Take 1 tablet (50 mg total) by mouth every 6 (six) hours as needed.  60 tablet  5   No current facility-administered medications for this visit.     Past Medical History  Diagnosis Date  . Hypertension   . Allergy   . Cancer   . Ovarian ca   . Cataract   . Breast CA   . CAD (coronary artery disease)   . Hyperlipidemia   . Cardiomyopathy     improved  . Arthritis   . Osteoporosis   . History of radiation therapy 1994    left chest wall  . History of chemotherapy   . Eccrine porocarcinoma of skin     Past Surgical History  Procedure Laterality Date  . Left breast mastectomy  1994    T3 N1 Mx  . Shoulder arthroscopy    . Right leg    . Shoulder surgery  replacement  . Modified radical mastectomy w/ axillary lymph node dissection w/ pectoralis minor excision  1994    Left  . Eye surgery      CATARACTS  . Abdominal hysterectomy  2004  . Cholecystectomy  1993  . Breast surgery    . Skin biopsy  04/27/2011    left hand, forearm and posterior arm    History   Social History  . Marital Status: Widowed    Spouse Name: N/A    Number of Children: 1  . Years of Education: N/A   Occupational History  . Not on file.   Social History Main Topics  . Smoking status: Never Smoker   . Smokeless tobacco: Never Used  . Alcohol Use: No  . Drug Use: No  . Sexual Activity: Not on file     Comment: didn't ask   Other Topics Concern  . Not on file   Social History Narrative  . No narrative on file    ROS: no fevers or chills, productive cough, hemoptysis, dysphasia, odynophagia, melena, hematochezia, dysuria, hematuria, rash, seizure activity, orthopnea, PND, pedal edema,  claudication. Remaining systems are negative.  Physical Exam: Well-developed well-nourished in no acute distress.  Skin is warm and dry. Skin cancer left upper extremity HEENT is normal.  Neck is supple.  Chest is clear to auscultation with normal expansion.  Cardiovascular exam is regular rate and rhythm. 2/6 systolic murmur apex Abdominal exam nontender or distended. No masses palpated. Extremities show no edema. Previous trauma right lower extremity neuro grossly intact  ECG Sinus rhythm with PACs. Left bundle branch block.

## 2014-04-04 NOTE — Assessment & Plan Note (Signed)
Blood pressure controlled. Continue present medications. 

## 2014-04-04 NOTE — Assessment & Plan Note (Signed)
Last echocardiogram showed ejection fraction of 50%. Continue beta blocker and ACE inhibitor.

## 2014-04-04 NOTE — Assessment & Plan Note (Signed)
Continue statin. 

## 2014-04-04 NOTE — Assessment & Plan Note (Signed)
Continue aspirin and statin. 

## 2014-04-05 ENCOUNTER — Encounter (INDEPENDENT_AMBULATORY_CARE_PROVIDER_SITE_OTHER): Payer: Self-pay | Admitting: Surgery

## 2014-04-05 ENCOUNTER — Ambulatory Visit (INDEPENDENT_AMBULATORY_CARE_PROVIDER_SITE_OTHER): Payer: Medicare HMO | Admitting: Surgery

## 2014-04-05 VITALS — BP 124/70 | HR 80 | Temp 97.2°F | Resp 18 | Ht 61.0 in | Wt 105.0 lb

## 2014-04-05 DIAGNOSIS — R222 Localized swelling, mass and lump, trunk: Secondary | ICD-10-CM

## 2014-04-05 DIAGNOSIS — Z853 Personal history of malignant neoplasm of breast: Secondary | ICD-10-CM

## 2014-04-05 NOTE — Progress Notes (Signed)
Fredric Mare 80 y.o.  Body mass index is 19.85 kg/(m^2).  Patient Active Problem List   Diagnosis Date Noted  . Other bilateral bundle branch block 01/12/2014  . 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 TENDINITIS, RIGHT WRIST 11/12/2010  . WRIST PAIN, RIGHT 11/05/2010  . SYNCOPE 09/23/2010  . BENIGN NEOPLASM SKIN OTHER&UNSPEC PARTS FACE 08/06/2010  . BACK PAIN 07/03/2010  . HYPERLIPIDEMIA 01/15/2010  . OTHER PRIMARY CARDIOMYOPATHIES 01/15/2010  . ALLERGIC RHINITIS 08/22/2009  . UTI 02/16/2008  . OSTEOPOROSIS 02/16/2008  . COPD 02/04/2008  . UPPER RESPIRATORY INFECTION, VIRAL 09/08/2007  . HYPERTENSION 07/13/2007  . CORONARY ARTERY DISEASE 07/13/2007  . BREAST CANCER, HX OF 07/13/2007  . COLONIC POLYPS, HX OF 07/13/2007    Allergies  Allergen Reactions  . Ibuprofen Nausea Only  . Morphine Nausea And Vomiting    Past Surgical History  Procedure Laterality Date  . Left breast mastectomy  1994    T3 N1 Mx  . Shoulder arthroscopy    . Right leg    . Shoulder surgery      replacement  . Modified radical mastectomy w/ axillary lymph node dissection w/ pectoralis minor excision  1994    Left  . Eye surgery      CATARACTS  . Abdominal hysterectomy  2004  . Cholecystectomy  1993  . Breast surgery    . Skin biopsy  04/27/2011    left hand, forearm and posterior arm   Nyoka Cowden, MD 1. Nodule of chest wall   2. Hx of breast cancer     Nodule of chest wall was infiltrated with 1 % lido with neut.  Tangentially the 16 gauge core biopsy device was introduced and fired.  The resultant material was sent for path.  If inadequate, will excise the nodule under local.  Bleeding controlled with direct pressure.  No apparent complications noted.  Return 1 week to review.  Matt B. Hassell Done, MD, [facility] [phone] beeper [phone]  04/05/2014 11:15 AM

## 2014-04-05 NOTE — Patient Instructions (Signed)
If they cannot make a diagnosis with the core, then our office will set up for excision of the entire mass under local.

## 2014-04-07 ENCOUNTER — Emergency Department
Admission: EM | Admit: 2014-04-07 | Discharge: 2014-04-07 | Disposition: A | Discharge status: Home / Self Care | Payer: Medicare HMO | Source: Home / Self Care | Attending: Family Medicine | Admitting: Family Medicine

## 2014-04-07 ENCOUNTER — Encounter: Payer: Self-pay | Admitting: Emergency Medicine

## 2014-04-07 DIAGNOSIS — N3090 Cystitis, unspecified without hematuria: Secondary | ICD-10-CM

## 2014-04-07 LAB — POCT URINALYSIS DIP (MANUAL ENTRY)
Glucose, UA: 100
NITRITE UA: POSITIVE
Protein Ur, POC: 30
Spec Grav, UA: <=1.005 (ref 1.005–1.03)
Urobilinogen, UA: 4 (ref 0–1)
pH, UA: 6 (ref 5–8)

## 2014-04-07 MED ORDER — SULFAMETHOXAZOLE-TRIMETHOPRIM 400-80 MG PO TABS: 1.0000 | ORAL_TABLET | Freq: Two times a day (BID) | ORAL | Status: DC

## 2014-04-07 NOTE — ED Provider Notes (Signed)
CSN: [id number]     Arrival date & time 04/07/14  1036 History   First MD Initiated Contact with Patient 04/07/14 1106     Chief Complaint  Patient presents with  . Dysuria      HPI Comments: Patient reports persistent dysuria, frequency, and nocturia since her last visit one month ago.  No fevers, chills, and sweats.  Patient is a 80 y.o. female presenting with dysuria. The history is provided by the patient.  Dysuria Pain quality:  Burning Pain severity:  Mild Onset quality:  Gradual Duration:  1 month Timing:  Constant Progression:  Unchanged Chronicity:  Chronic Recent urinary tract infections: yes   Relieved by:  Nothing Worsened by:  Nothing tried Ineffective treatments:  None tried Urinary symptoms: frequent urination and hesitancy   Urinary symptoms: no discolored urine, no foul-smelling urine, no hematuria and no bladder incontinence   Associated symptoms: no abdominal pain, no fever, no flank pain, no genital lesions, no nausea, no vaginal discharge and no vomiting   Risk factors: recurrent urinary tract infections     Past Medical History  Diagnosis Date  . Hypertension   . Allergy   . Cancer   . Ovarian ca   . Cataract   . Breast CA   . CAD (coronary artery disease)   . Hyperlipidemia   . Cardiomyopathy     improved  . Arthritis   . Osteoporosis   . History of radiation therapy 1994    left chest wall  . History of chemotherapy   . Eccrine porocarcinoma of skin    Past Surgical History  Procedure Laterality Date  . Left breast mastectomy  1994    T3 N1 Mx  . Shoulder arthroscopy    . Right leg    . Shoulder surgery      replacement  . Modified radical mastectomy w/ axillary lymph node dissection w/ pectoralis minor excision  1994    Left  . Eye surgery      CATARACTS  . Abdominal hysterectomy  2004  . Cholecystectomy  1993  . Breast surgery    . Skin biopsy  04/27/2011    left hand, forearm and posterior arm   Family History  Problem  Relation Age of Onset  . Breast cancer Sister     dx in her 82s  . Heart disease Sister   . Heart disease Father   . Heart disease Mother   . Stroke Mother   . Prostate cancer Maternal Uncle   . Stroke Maternal Grandmother   . Testicular cancer Maternal Grandfather   . Stroke Maternal Grandfather   . Stroke Paternal Grandmother   . Heart disease Paternal Grandfather   . Breast cancer Sister 96    BRCA2+  . Breast cancer Sister 71  . Prostate cancer Maternal Uncle   . Breast cancer Other     maternal great aunt dx later in life   History  Substance Use Topics  . Smoking status: Never Smoker   . Smokeless tobacco: Never Used  . Alcohol Use: No   OB History   Grav Para Term Preterm Abortions TAB SAB Ect Mult Living                 Review of Systems  Constitutional: Negative for fever.  Gastrointestinal: Negative for nausea, vomiting and abdominal pain.  Genitourinary: Positive for dysuria. Negative for flank pain and vaginal discharge.  All other systems reviewed and are negative.   Allergies  Ibuprofen and Morphine  Home Medications   Prior to Admission medications   Medication Sig Start Date End Date Taking? Authorizing Provider  acyclovir (ZOVIRAX) 400 MG tablet Take 0.5 tablets (200 mg total) by mouth 3 (three) times daily as needed (for fever blisters.). 09/20/13   Marletta Lor, MD  amLODipine (NORVASC) 2.5 MG tablet TAKE 1 TABLET DAILY. 07/19/13   Marletta Lor, MD  aspirin EC 81 MG tablet Take 81 mg by mouth at bedtime.     Historical Provider, MD  Calcium Carbonate-Vitamin D (CALCIUM + D PO) Take 600 mg by mouth daily after lunch.     Historical Provider, MD  carvedilol (COREG) 25 MG tablet TAKE 1 TABLET 2 TIMES A DAY WITH FOOD. 01/29/14   Marletta Lor, MD  Cholecalciferol (VITAMIN D) 2000 UNITS tablet Take 2,000 Units by mouth daily.    Historical Provider, MD  Coenzyme Q10 (CO Q 10) 100 MG CAPS Take 1 capsule by mouth daily.    Historical  Provider, MD  denosumab (PROLIA) 60 MG/ML SOLN injection Inject 60 mg into the skin every 6 (six) months. Administer in upper arm, thigh, or abdomen    Historical Provider, MD  ferrous gluconate (FERGON) 324 MG tablet Take 324 mg by mouth once a week. Takes 2 tabs on empty stomach with OJ on Monday. 04/01/12   Lennis Marion Downer, MD  HYDROcodone-acetaminophen (NORCO/VICODIN) 5-325 MG per tablet Take 1 tablet by mouth every 6 (six) hours as needed for pain.    Historical Provider, MD  lisinopril (PRINIVIL,ZESTRIL) 20 MG tablet Take 1 tablet (20 mg total) by mouth at bedtime. 07/19/13   Marletta Lor, MD  loperamide (IMODIUM) 2 MG capsule Take 1 capsule (2 mg total) by mouth as needed for diarrhea or loose stools. 01/24/13   Lennis Marion Downer, MD  simvastatin (ZOCOR) 40 MG tablet Take 1 tablet (40 mg total) by mouth at bedtime. 07/19/13   Marletta Lor, MD  sulfamethoxazole-trimethoprim (BACTRIM,SEPTRA) 400-80 MG per tablet Take 1 tablet by mouth 2 (two) times daily. 04/07/14   Kandra Nicolas, MD  traMADol (ULTRAM) 50 MG tablet Take 1 tablet (50 mg total) by mouth every 6 (six) hours as needed. 02/08/14   Marletta Lor, MD   BP 147/72  Pulse 74  Temp(Src) 97.5 F (36.4 C) (Oral)  Resp 16  Ht _0  (1.575 m)  Wt 104 lb (47.174 kg)  BMI 19.02 kg/m2  SpO2 98% Physical Exam Nursing notes and Vital Signs reviewed. Appearance:  Patient appears healthy, stated age, and in no acute distress Eyes:  Pupils are equal, round, and reactive to light and accomodation.  Extraocular movement is intact.  Conjunctivae are not inflamed  Pharynx:  Normal; moist mucous membranes  Neck:  Supple. No adenopathy  Lungs:  Clear to auscultation.  Breath sounds are equal.  Heart:  Regular rate and rhythm without murmurs, rubs, or gallops.  Abdomen:  Nontender without masses or hepatosplenomegaly.  Bowel sounds are present.  No CVA or flank tenderness.  Extremities:  No edema.  No calf tenderness Skin:  No rash  present.   ED Course  Procedures  none    Labs Reviewed  URINE CULTURE  POCT URINALYSIS DIP (MANUAL ENTRY); GLU 134m/dL; BIL small; KET trace; BLO moderate; PRO 32mdL; URO 4.0 E.U./dL; NIT positive; LEU large         MDM   1. Recurrent cystitis    Urine culture pending.  Begin Bactrim 400/80, one  tab bid. Continue increased fluid intake. Followup with urologist if not improving or if recurrent UTI develops    Kandra Nicolas, MD 04/11/14 2234

## 2014-04-07 NOTE — Discharge Instructions (Signed)
Continue increased fluid intake.   Urinary Tract Infection Urinary tract infections (UTIs) can develop anywhere along your urinary tract. Your urinary tract is your body's drainage system for removing wastes and extra water. Your urinary tract includes two kidneys, two ureters, a bladder, and a urethra. Your kidneys are a pair of bean-shaped organs. Each kidney is about the size of your fist. They are located below your ribs, one on each side of your spine. CAUSES Infections are caused by microbes, which are microscopic organisms, including fungi, viruses, and bacteria. These organisms are so small that they can only be seen through a microscope. Bacteria are the microbes that most commonly cause UTIs. SYMPTOMS  Symptoms of UTIs may vary by age and gender of the patient and by the location of the infection. Symptoms in young women typically include a frequent and intense urge to urinate and a painful, burning feeling in the bladder or urethra during urination. Older women and men are more likely to be tired, shaky, and weak and have muscle aches and abdominal pain. A fever may mean the infection is in your kidneys. Other symptoms of a kidney infection include pain in your back or sides below the ribs, nausea, and vomiting. DIAGNOSIS To diagnose a UTI, your caregiver will ask you about your symptoms. Your caregiver also will ask to provide a urine sample. The urine sample will be tested for bacteria and white blood cells. White blood cells are made by your body to help fight infection. TREATMENT  Typically, UTIs can be treated with medication. Because most UTIs are caused by a bacterial infection, they usually can be treated with the use of antibiotics. The choice of antibiotic and length of treatment depend on your symptoms and the type of bacteria causing your infection. HOME CARE INSTRUCTIONS  If you were prescribed antibiotics, take them exactly as your caregiver instructs you. Finish the  medication even if you feel better after you have only taken some of the medication.  Drink enough water and fluids to keep your urine clear or pale yellow.  Avoid caffeine, tea, and carbonated beverages. They tend to irritate your bladder.  Empty your bladder often. Avoid holding urine for long periods of time.  Empty your bladder before and after sexual intercourse.  After a bowel movement, women should cleanse from front to back. Use each tissue only once. SEEK MEDICAL CARE IF:   You have back pain.  You develop a fever.  Your symptoms do not begin to resolve within 3 days. SEEK IMMEDIATE MEDICAL CARE IF:   You have severe back pain or lower abdominal pain.  You develop chills.  You have nausea or vomiting.  You have continued burning or discomfort with urination. MAKE SURE YOU:   Understand these instructions.  Will watch your condition.  Will get help right away if you are not doing well or get worse. Document Released: 08/12/2005 Document Revised: 05/03/2012 Document Reviewed: 12/11/2011 [facility] Patient Information 2014 [place].

## 2014-04-07 NOTE — ED Notes (Signed)
Reports unrelieved dysuria and frequency since last UTI seen here 03/10/2014. Now third UTI in past 12 months.

## 2014-04-08 LAB — URINE CULTURE
Colony Count: NO GROWTH
ORGANISM ID, BACTERIA: NO GROWTH

## 2014-04-13 ENCOUNTER — Ambulatory Visit (INDEPENDENT_AMBULATORY_CARE_PROVIDER_SITE_OTHER): Payer: Medicare HMO | Admitting: Surgery

## 2014-04-13 ENCOUNTER — Encounter (INDEPENDENT_AMBULATORY_CARE_PROVIDER_SITE_OTHER): Payer: Self-pay | Admitting: Surgery

## 2014-04-13 VITALS — BP 98/62 | HR 80 | Temp 98.1°F | Resp 16 | Wt 103.2 lb

## 2014-04-13 DIAGNOSIS — R222 Localized swelling, mass and lump, trunk: Secondary | ICD-10-CM

## 2014-04-13 NOTE — Progress Notes (Signed)
Chief Complaint:  Nodule on mastectomy site, left  History of Present Illness:  Judy Herrera is an 80 y.o. female who has had some long term complications of her breast cancer and radiation therapy on the left side. The nodule that I biopsied came back fibrosis and muscle and no evidence of cancer. This nodule remains in a think it's worrisome in light of her history so I think it needs to be excised. We'll go ahead and set up for excision under local [facility].  Past Medical History  Diagnosis Date  . Hypertension   . Allergy   . Cancer   . Ovarian ca   . Cataract   . Breast CA   . CAD (coronary artery disease)   . Hyperlipidemia   . Cardiomyopathy     improved  . Arthritis   . Osteoporosis   . History of radiation therapy 1994    left chest wall  . History of chemotherapy   . Eccrine porocarcinoma of skin     Past Surgical History  Procedure Laterality Date  . Left breast mastectomy  1994    T3 N1 Mx  . Shoulder arthroscopy    . Right leg    . Shoulder surgery      replacement  . Modified radical mastectomy w/ axillary lymph node dissection w/ pectoralis minor excision  1994    Left  . Eye surgery      CATARACTS  . Abdominal hysterectomy  2004  . Cholecystectomy  1993  . Breast surgery    . Skin biopsy  04/27/2011    left hand, forearm and posterior arm    Current Outpatient Prescriptions  Medication Sig Dispense Refill  . acyclovir (ZOVIRAX) 400 MG tablet Take 0.5 tablets (200 mg total) by mouth 3 (three) times daily as needed (for fever blisters.).  45 tablet  3  . amLODipine (NORVASC) 2.5 MG tablet TAKE 1 TABLET DAILY.  90 tablet  3  . aspirin EC 81 MG tablet Take 81 mg by mouth at bedtime.       . Calcium Carbonate-Vitamin D (CALCIUM + D PO) Take 600 mg by mouth daily after lunch.       . carvedilol (COREG) 25 MG tablet TAKE 1 TABLET 2 TIMES A DAY WITH FOOD.  180 tablet  1  . Cholecalciferol (VITAMIN D) 2000 UNITS tablet Take 2,000 Units by mouth  daily.      . Coenzyme Q10 (CO Q 10) 100 MG CAPS Take 1 capsule by mouth daily.      Marland Kitchen denosumab (PROLIA) 60 MG/ML SOLN injection Inject 60 mg into the skin every 6 (six) months. Administer in upper arm, thigh, or abdomen      . ferrous gluconate (FERGON) 324 MG tablet Take 324 mg by mouth once a week. Takes 2 tabs on empty stomach with OJ on Monday.      Marland Kitchen lisinopril (PRINIVIL,ZESTRIL) 20 MG tablet Take 1 tablet (20 mg total) by mouth at bedtime.  90 tablet  3  . loperamide (IMODIUM) 2 MG capsule Take 1 capsule (2 mg total) by mouth as needed for diarrhea or loose stools.  20 capsule  0  . simvastatin (ZOCOR) 40 MG tablet Take 1 tablet (40 mg total) by mouth at bedtime.  90 tablet  3  . sulfamethoxazole-trimethoprim (BACTRIM,SEPTRA) 400-80 MG per tablet Take 1 tablet by mouth 2 (two) times daily.  10 tablet  0  . traMADol (ULTRAM) 50 MG tablet  Take 1 tablet (50 mg total) by mouth every 6 (six) hours as needed.  60 tablet  5   No current facility-administered medications for this visit.   Ibuprofen and Morphine Family History  Problem Relation Age of Onset  . Breast cancer Sister     dx in her 46s  . Heart disease Sister   . Heart disease Father   . Heart disease Mother   . Stroke Mother   . Prostate cancer Maternal Uncle   . Stroke Maternal Grandmother   . Testicular cancer Maternal Grandfather   . Stroke Maternal Grandfather   . Stroke Paternal Grandmother   . Heart disease Paternal Grandfather   . Breast cancer Sister 95    BRCA2+  . Breast cancer Sister 81  . Prostate cancer Maternal Uncle   . Breast cancer Other     maternal great aunt dx later in life   Social History:   reports that she has never smoked. She has never used smokeless tobacco. She reports that she does not drink alcohol or use illicit drugs.   REVIEW OF SYSTEMS - PERTINENT POSITIVES ONLY: noncontributory  Physical Exam:   Blood pressure 98/62, pulse 80, temperature 98.1 F (36.7 C), temperature source  Oral, resp. rate 16, weight 103 lb 3.2 oz (46.811 kg). Body mass index is 18.87 kg/(m^2).  Gen:  WDWN white female NAD  Neurological: Alert and oriented to person, place, and time. Motor and sensory function is grossly intact  Head: Normocephalic and atraumatic.  Eyes: Conjunctivae are normal. Pupils are equal, round, and reactive to light. No scleral icterus.  Neck: Normal range of motion. Neck supple. No tracheal deviation or thyromegaly present.  Cardiovascular:  SR without murmurs or gallops.  No carotid bruits Respiratory: Effort normal.  No respiratory distress. No chest wall tenderness. Breath sounds normal.  No wheezes, rales or rhonchi.  Breast:  Left mastectomy site with nodule along the pectoral border. GU: Musculoskeletal: Normal range of motion. Extremities are nontender. No cyanosis, edema or clubbing noted Lymphadenopathy: No cervical, preauricular, postauricular or axillary adenopathy is present Skin: Skin is warm and dry. No rash noted. No diaphoresis. No erythema. No pallor. Pscyh: Normal mood and affect. Behavior is normal. Judgment and thought content normal.   LABORATORY RESULTS: No results found for this or any previous visit (from the past 48 hour(s)).  RADIOLOGY RESULTS: No results found.  Problem List: Patient Active Problem List   Diagnosis Date Noted  . Other bilateral bundle branch block 01/12/2014  . 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 TENDINITIS, RIGHT WRIST 11/12/2010  . WRIST PAIN, RIGHT 11/05/2010  . SYNCOPE 09/23/2010  . BENIGN NEOPLASM SKIN OTHER&UNSPEC PARTS FACE 08/06/2010  . BACK PAIN 07/03/2010  . HYPERLIPIDEMIA 01/15/2010  . OTHER PRIMARY CARDIOMYOPATHIES 01/15/2010  . ALLERGIC RHINITIS 08/22/2009  . UTI 02/16/2008  . OSTEOPOROSIS 02/16/2008  . COPD 02/04/2008  . UPPER RESPIRATORY INFECTION, VIRAL 09/08/2007  . HYPERTENSION 07/13/2007  . CORONARY ARTERY DISEASE 07/13/2007  . BREAST CANCER, HX OF 07/13/2007  . COLONIC POLYPS, HX  OF 07/13/2007    Assessment & Plan:  Will excise a nodule along the pectoral border on the left side.    Matt B. Hassell Done, MD, [facility] [phone] beeper [phone]  04/13/2014 4:28 PM

## 2014-04-14 ENCOUNTER — Telehealth: Payer: Self-pay

## 2014-04-14 NOTE — ED Notes (Signed)
I called and spoke with patient and she is doing better. I advised to call back if anything changes or if she has questions or concerns. Advised patient of negative urine culture.

## 2014-04-17 ENCOUNTER — Ambulatory Visit (INDEPENDENT_AMBULATORY_CARE_PROVIDER_SITE_OTHER): Payer: Medicare HMO | Admitting: Internal Medicine

## 2014-04-17 ENCOUNTER — Encounter: Payer: Self-pay | Admitting: Internal Medicine

## 2014-04-17 VITALS — BP 100/60 | HR 76 | Temp 98.4°F | Resp 18 | Ht 62.0 in | Wt 105.0 lb

## 2014-04-17 DIAGNOSIS — I10 Essential (primary) hypertension: Secondary | ICD-10-CM

## 2014-04-17 DIAGNOSIS — R300 Dysuria: Secondary | ICD-10-CM

## 2014-04-17 DIAGNOSIS — N3281 Overactive bladder: Secondary | ICD-10-CM

## 2014-04-17 DIAGNOSIS — N390 Urinary tract infection, site not specified: Secondary | ICD-10-CM

## 2014-04-17 DIAGNOSIS — N318 Other neuromuscular dysfunction of bladder: Secondary | ICD-10-CM

## 2014-04-17 LAB — POCT URINALYSIS DIPSTICK
Bilirubin, UA: NEGATIVE
Blood, UA: NEGATIVE
Glucose, UA: NEGATIVE
Ketones, UA: NEGATIVE
Leukocytes, UA: NEGATIVE
Nitrite, UA: NEGATIVE
PH UA: 6.5
PROTEIN UA: NEGATIVE
Spec Grav, UA: 1.015
Urobilinogen, UA: 0.2

## 2014-04-17 NOTE — Patient Instructions (Signed)
Overactive Bladder, Adult The bladder has two functions that are totally opposite of the other. One is to relax and stretch out so it can store urine (fills like a balloon), and the other is to contract and squeeze down so that it can empty the urine that it has stored. Proper functioning of the bladder is a complex mixing of these two functions. The filling and emptying of the bladder can be influenced by:  The bladder.  The spinal cord.  The brain.  The nerves going to the bladder.  Other organs that are closely related to the bladder such as prostate in males and the vagina in females. As your bladder fills with urine, nerve signals are sent from the bladder to the brain to tell you that you may need to urinate. Normal urination requires that the bladder squeeze down with sufficient strength to empty the bladder, but this also requires that the bladder squeeze down sufficiently long to finish the job. In addition the sphincter muscles, which normally keep you from leaking urine, must also relax so that the urine can pass. Coordination between the bladder muscle squeezing down and the sphincter muscles relaxing is required to make everything happen normally. With an overactive bladder sometimes the muscles of the bladder contract unexpectedly and involuntarily and this causes an urgent need to urinate. The normal response is to try to hold urine in by contracting the sphincter muscles. Sometimes the bladder contracts so strongly that the sphincter muscles cannot stop the urine from passing out and incontinence occurs. This kind of incontinence is called urge incontinence. Having an overactive bladder can be embarrassing and awkward. It can keep you from living life the way you want to. Many people think it is just something you have to put up with as you grow older or have certain health conditions. In fact, there are treatments that can help make your life easier and more pleasant. CAUSES  Many  things can cause an overactive bladder. Possibilities include:  Urinary tract infection or infection of nearby tissues such as the prostate.  Prostate enlargement.  In women, multiple pregnancies or surgery on the uterus or urethra.  Bladder stones, inflammation or tumors.  Caffeine.  Alcohol.  Medications. For example, diuretics (drugs that help the body get rid of extra fluid) increase urine production. Some other medicines must be taken with lots of fluids.  Muscle or nerve weakness. This might be the result of a spinal cord injury, a stroke, multiple sclerosis or Parkinson's disease.  Diabetes can cause a high urine volume which fills the bladder so quickly that the normal urge to urinate is triggered very strongly. SYMPTOMS   Loss of bladder control. You feel the need to urinate and cannot make your body wait.  Sudden, strong urges to urinate.  Urinating 8 or more times a day.  Waking up to urinate two or more times a night. DIAGNOSIS  To decide if you have overactive bladder, your healthcare provider will probably:  Ask about symptoms you have noticed.  Ask about your overall health. This will include questions about any medications you are taking.  Do a physical examination. This will help determine if there are obvious blockages or other problems.  Order some tests. These might include:  A blood test to check for diabetes or other health issues that could be contributing to the problem.  Urine testing. This could measure the flow of urine and the pressure on the bladder.  A test of your neurological   system (the brain, spinal cord and nerves). This is the system that senses the need to urinate. Some of these tests are called flow tests, bladder pressure tests and electrical measurements of the sphincter muscle.  A bladder test to check whether it is emptying completely when you urinate.  Cytoscopy. This test uses a thin tube with a tiny camera on it. It offers a  look inside your urethra and bladder to see if there are problems.  Imaging tests. You might be given a contrast dye and then asked to urinate. X-rays are taken to see how your bladder is working. TREATMENT  An overactive bladder can be treated in many ways. The treatment will depend on the cause. Whether you have a mild or severe case also makes a difference. Often, treatment can be given in your healthcare provider's office or clinic. Be sure to discuss the different options with your caregiver. They include:  Behavioral treatments. These do not involve medication or surgery:  Bladder training. For this, you would follow a schedule to urinate at regular intervals. This helps you learn to control the urge to urinate. At first, you might be asked to wait a few minutes after feeling the urge. In time, you should be able to schedule bathroom visits an hour or more apart.  Kegel exercises. These exercises strengthen the pelvic floor muscles, which support the bladder. By toning these muscles, they can help control urination, even if the bladder muscles are overactive. A specialist will teach you how to do these exercises correctly. They will require daily practice.  Weight loss. If you are obese or overweight, losing weight might stop your bladder from being overactive. Talk to your healthcare provider about how many pounds you should lose. Also ask if there is a specific program or method that would work best for you.  Diet change. This might be suggested if constipation is making your overactive bladder worse. Your healthcare provider or a nutritionist can explain ways to change what you eat to ease constipation. Other people might need to take in less caffeine or alcohol. Sometimes drinking fewer fluids is needed, too.  Protection. This is not an actual treatment. But, you could wear special pads to take care of any leakage while you wait for other treatments to take effect. This will help you avoid  embarrassment.  Physical treatments.  Electrical stimulation. Electrodes will send gentle pulses to the nerves or muscles that help control the bladder. The goal is to strengthen them. Sometimes this is done with the electrodes outside of the body. Or, they might be placed inside the body (implanted). This treatment can take several months to have an effect.  Medications. These are usually used along with other treatments. Several medicines are available. Some are injected into the muscles involved in urination. Others come in pill form. Medications sometimes prescribed include:  Anticholinergics. These drugs block the signals that the nerves deliver to the bladder. This keeps it from releasing urine at the wrong time. Researchers think the drugs might help in other ways, too.  Imipramine. This is an antidepressant. But, it relaxes bladder muscles.  Botox. This is still experimental. Some people believe that injecting it into the bladder muscles will relax them so they work more normally. It has also been injected into the sphincter muscle when the sphincter muscle does not open properly. This is a temporary fix, however. Also, it might make matters worse, especially in older people.  Surgery.  A device might be implanted  bladder muscles will relax them so they work more normally. It has also been injected into the sphincter muscle when the sphincter muscle does not open properly. This is a temporary fix, however. Also, it might make matters worse, especially in older people.  · Surgery.  · A device might be implanted to help manage your nerves. It works on the nerves that signal when you need to urinate.  · Surgery is sometimes needed with electrical stimulation. If the electrodes are implanted, this is done through surgery.  · Sometimes repairs need to be made through surgery. For example, the size of the bladder can be changed. This is usually done in severe cases only.  HOME CARE INSTRUCTIONS   · Take any medications your healthcare provider prescribed or suggested. Follow the directions carefully.  · Practice any lifestyle changes that are recommended. These might include:  · Drinking less fluid or drinking at different times of the day. If you need to urinate often during the night, for  example, you may need to stop drinking fluids early in the evening.  · Cutting down on caffeine or alcohol. They can both make an overactive bladder worse. Caffeine is found in coffee, tea and sodas.  · Doing Kegel exercises to strengthen muscles.  · Losing weight, if that is recommended.  · Eating a healthy and balanced diet. This will help you avoid constipation.  · Keep a journal or a log. You might be asked to record how much you drink and when, and also when you feel the need to urinate.  · Learn how to care for implants or other devices, such as pessaries.  SEEK MEDICAL CARE IF:   · Your overactive bladder gets worse.  · You feel increased pain or irritation when you urinate.  · You notice blood in your urine.  · You have questions about any medications or devices that your healthcare provider recommended.  · You notice blood, pus or swelling at the site of any test or treatment procedure.  · You have an oral temperature above 102° F (38.9° C).  SEEK IMMEDIATE MEDICAL CARE IF:   You have an oral temperature above 102° F (38.9° C), not controlled by medicine.  Document Released: 08/29/2009 Document Revised: 01/25/2012 Document Reviewed: 08/29/2009  [employer]® Patient Information ©2014 [employer].

## 2014-04-17 NOTE — Progress Notes (Signed)
Subjective:    Patient ID: Judy Herrera, female    DOB: 1931-07-08, 80 y.o.   MRN: [id number]  HPI  80 year old patient who has treated hypertension.  She has been seen twice recently in urgent care with dysuria and frequency.  Urinalysis has revealed pyuria.  She has received 2 treatments of antibiotic therapy.  The final urine culture was negative.  She feels that she never had total resolution of her symptoms.  At the present time.  She has some discomfort with voiding, but her chief complaint is urinary frequency with nocturia.  Urinalysis reviewed today and was normal  Past Medical History  Diagnosis Date  . Hypertension   . Allergy   . Cancer   . Ovarian ca   . Cataract   . Breast CA   . CAD (coronary artery disease)   . Hyperlipidemia   . Cardiomyopathy     improved  . Arthritis   . Osteoporosis   . History of radiation therapy 1994    left chest wall  . History of chemotherapy   . Eccrine porocarcinoma of skin     History   Social History  . Marital Status: Widowed    Spouse Name: N/A    Number of Children: 1  . Years of Education: N/A   Occupational History  . Not on file.   Social History Main Topics  . Smoking status: Never Smoker   . Smokeless tobacco: Never Used  . Alcohol Use: No  . Drug Use: No  . Sexual Activity: Not on file     Comment: didn't ask   Other Topics Concern  . Not on file   Social History Narrative  . No narrative on file    Past Surgical History  Procedure Laterality Date  . Left breast mastectomy  1994    T3 N1 Mx  . Shoulder arthroscopy    . Right leg    . Shoulder surgery      replacement  . Modified radical mastectomy w/ axillary lymph node dissection w/ pectoralis minor excision  1994    Left  . Eye surgery      CATARACTS  . Abdominal hysterectomy  2004  . Cholecystectomy  1993  . Breast surgery    . Skin biopsy  04/27/2011    left hand, forearm and posterior arm    Family History  Problem Relation Age  of Onset  . Breast cancer Sister     dx in her 10s  . Heart disease Sister   . Heart disease Father   . Heart disease Mother   . Stroke Mother   . Prostate cancer Maternal Uncle   . Stroke Maternal Grandmother   . Testicular cancer Maternal Grandfather   . Stroke Maternal Grandfather   . Stroke Paternal Grandmother   . Heart disease Paternal Grandfather   . Breast cancer Sister 14    BRCA2+  . Breast cancer Sister 110  . Prostate cancer Maternal Uncle   . Breast cancer Other     maternal great aunt dx later in life    Allergies  Allergen Reactions  . Ibuprofen Nausea Only  . Morphine Nausea And Vomiting    Current Outpatient Prescriptions on File Prior to Visit  Medication Sig Dispense Refill  . acyclovir (ZOVIRAX) 400 MG tablet Take 0.5 tablets (200 mg total) by mouth 3 (three) times daily as needed (for fever blisters.).  45 tablet  3  . amLODipine (NORVASC) 2.5 MG  tablet TAKE 1 TABLET DAILY.  90 tablet  3  . aspirin EC 81 MG tablet Take 81 mg by mouth at bedtime.       . Calcium Carbonate-Vitamin D (CALCIUM + D PO) Take 600 mg by mouth daily after lunch.       . carvedilol (COREG) 25 MG tablet TAKE 1 TABLET 2 TIMES A DAY WITH FOOD.  180 tablet  1  . Cholecalciferol (VITAMIN D) 2000 UNITS tablet Take 2,000 Units by mouth daily.      . Coenzyme Q10 (CO Q 10) 100 MG CAPS Take 1 capsule by mouth daily.      Marland Kitchen denosumab (PROLIA) 60 MG/ML SOLN injection Inject 60 mg into the skin every 6 (six) months. Administer in upper arm, thigh, or abdomen      . ferrous gluconate (FERGON) 324 MG tablet Take 324 mg by mouth once a week. Takes 2 tabs on empty stomach with OJ on Monday.      Marland Kitchen lisinopril (PRINIVIL,ZESTRIL) 20 MG tablet Take 1 tablet (20 mg total) by mouth at bedtime.  90 tablet  3  . loperamide (IMODIUM) 2 MG capsule Take 1 capsule (2 mg total) by mouth as needed for diarrhea or loose stools.  20 capsule  0  . simvastatin (ZOCOR) 40 MG tablet Take 1 tablet (40 mg total) by mouth  at bedtime.  90 tablet  3  . traMADol (ULTRAM) 50 MG tablet Take 1 tablet (50 mg total) by mouth every 6 (six) hours as needed.  60 tablet  5   No current facility-administered medications on file prior to visit.    BP 100/60  Pulse 76  Temp(Src) 98.4 F (36.9 C) (Oral)  Resp 18  Ht _0  (1.575 m)  Wt 105 lb (47.628 kg)  BMI 19.20 kg/m2  SpO2 95%       Review of Systems  Constitutional: Negative.   HENT: Negative for congestion, dental problem, hearing loss, rhinorrhea, sinus pressure, sore throat and tinnitus.   Eyes: Negative for pain, discharge and visual disturbance.  Respiratory: Negative for cough and shortness of breath.   Cardiovascular: Negative for chest pain, palpitations and leg swelling.  Gastrointestinal: Negative for nausea, vomiting, abdominal pain, diarrhea, constipation, blood in stool and abdominal distention.  Genitourinary: Positive for dysuria, urgency and frequency. Negative for hematuria, flank pain, vaginal bleeding, vaginal discharge, difficulty urinating, vaginal pain and pelvic pain.  Musculoskeletal: Negative for arthralgias, gait problem and joint swelling.  Skin: Negative for rash.  Neurological: Negative for dizziness, syncope, speech difficulty, weakness, numbness and headaches.  Hematological: Negative for adenopathy.  Psychiatric/Behavioral: Negative for behavioral problems, dysphoric mood and agitation. The patient is not nervous/anxious.        Objective:   Physical Exam  Constitutional: She is oriented to person, place, and time. She appears well-developed and well-nourished.  HENT:  Head: Normocephalic.  Right Ear: External ear normal.  Left Ear: External ear normal.  Mouth/Throat: Oropharynx is clear and moist.  Eyes: Conjunctivae and EOM are normal. Pupils are equal, round, and reactive to light.  Neck: Normal range of motion. Neck supple. No thyromegaly present.  Cardiovascular: Normal rate, regular rhythm, normal heart sounds  and intact distal pulses.   Pulmonary/Chest: Effort normal and breath sounds normal.  Abdominal: Soft. Bowel sounds are normal. She exhibits no mass. There is no tenderness.  Musculoskeletal: Normal range of motion.  Lymphadenopathy:    She has no cervical adenopathy.  Neurological: She is alert and oriented to  person, place, and time.  Skin: Skin is warm and dry. No rash noted.  Psychiatric: She has a normal mood and affect. Her behavior is normal.          Assessment & Plan:  Probable overactive bladder.  Samples provided Information dispensed Hypertension stable

## 2014-04-18 ENCOUNTER — Telehealth: Payer: Self-pay | Admitting: Internal Medicine

## 2014-04-18 NOTE — Telephone Encounter (Signed)
Relevant patient education mailed to patient.  

## 2014-04-19 ENCOUNTER — Ambulatory Visit
Admission: RE | Admit: 2014-04-19 | Discharge: 2014-04-19 | Disposition: A | Discharge status: Home / Self Care | Payer: Medicare HMO | Source: Ambulatory Visit | Attending: Radiation Oncology | Admitting: Radiation Oncology

## 2014-04-19 ENCOUNTER — Encounter: Payer: Self-pay | Admitting: Radiation Oncology

## 2014-04-19 VITALS — BP 138/59 | HR 73 | Temp 97.6°F | Resp 16 | Wt 106.2 lb

## 2014-04-19 DIAGNOSIS — C4499 Other specified malignant neoplasm of skin, unspecified: Secondary | ICD-10-CM

## 2014-04-19 NOTE — Progress Notes (Signed)
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   Denies pain in left arm. Denies nausea, vomiting, headache, dizziness or fatigue. Weight stable.  She developed a new mastectomy scar nodule to be excised by Dr. Hassell Done.                              ALLERGIES:  is allergic to ibuprofen and morphine.  Meds: Current Outpatient Prescriptions  Medication Sig Dispense Refill  . amLODipine (NORVASC) 2.5 MG tablet TAKE 1 TABLET DAILY.  90 tablet  3  . aspirin EC 81 MG tablet Take 81 mg by mouth at bedtime.       . Calcium Carbonate-Vitamin D (CALCIUM + D PO) Take 600 mg by mouth daily after lunch.       . carvedilol (COREG) 25 MG tablet TAKE 1 TABLET 2 TIMES A DAY WITH FOOD.  180 tablet  1  . Cholecalciferol (VITAMIN D) 2000 UNITS tablet Take 2,000 Units by mouth daily.      . Coenzyme Q10 (CO Q 10) 100 MG CAPS Take 1 capsule by mouth daily.      Marland Kitchen denosumab (PROLIA) 60 MG/ML SOLN injection Inject 60 mg into the skin every 6 (six) months. Administer in upper arm, thigh, or abdomen      . ferrous gluconate (FERGON) 324 MG tablet Take 324 mg by mouth once a week. Takes 2 tabs on empty stomach with OJ on Monday.      Marland Kitchen lisinopril (PRINIVIL,ZESTRIL) 20 MG tablet Take 1 tablet (20 mg total) by mouth at bedtime.  90 tablet  3  . simvastatin (ZOCOR) 40 MG tablet Take 1 tablet (40 mg total) by mouth at bedtime.  90  tablet  3  . traMADol (ULTRAM) 50 MG tablet Take 1 tablet (50 mg total) by mouth every 6 (six) hours as needed.  60 tablet  5  . acyclovir (ZOVIRAX) 400 MG tablet Take 0.5 tablets (200 mg total) by mouth 3 (three) times daily as needed (for fever blisters.).  45 tablet  3  . loperamide (IMODIUM) 2 MG capsule Take 1 capsule (2 mg total) by mouth as needed for diarrhea or loose stools.  20 capsule  0   No current facility-administered medications for this encounter.    Physical Findings: The patient is in no acute distress. Patient is alert and oriented.  weight is 106 lb 3.2 oz (48.172 kg). Her oral temperature is 97.6 F (36.4 C). Her blood pressure is 138/59 and her pulse is 73. Her respiration is 16 and oxygen saturation is 100%. .The recently treated left upper posterior arm treated lesion with dry scab but, smaller. Left olecranon process lesion appears worse with bloody drainage. No significant edema of left arm noted.  No significant changes.  Lab Findings: Lab Results  Component Value Date  WBC 4.5 03/28/2014   HGB 11.0* 03/28/2014   HCT 32.4* 03/28/2014   MCV 95.3 03/28/2014   PLT 144* 03/28/2014   Impression:  The patient is recovering from the effects of radiation with improvement in the most recently treated lesion.  However, her olecranon lesion treated 10 months ago is showing evidence of progression.  She could potentially be re-treated to the left olecranon lesion, but, this would carry increased risk of a non-healing radiation wound from re-treatment.  I don't think that is warranted at this time, but, could be considered if continued progression.  Plan:  The patient will undergo excision of her mastectomy nodule.  She will see me in follow-up on an as needed basis.  _____________________________________  Sheral Apley Tammi Klippel, M.D.

## 2014-04-19 NOTE — Progress Notes (Signed)
Left upper posterior arm treated lesion with dry scab but, smaller. Left olecranon process lesion with bloody drainage. No significant edema of left arm noted. Denies pain in left arm. Denies nausea, vomiting, headache, dizziness or fatigue. Weight stable.

## 2014-04-25 ENCOUNTER — Other Ambulatory Visit: Payer: Self-pay | Admitting: Internal Medicine

## 2014-04-27 ENCOUNTER — Encounter: Payer: Self-pay | Admitting: Gynecology

## 2014-04-27 ENCOUNTER — Ambulatory Visit: Payer: Medicare HMO | Attending: Gynecology | Admitting: Gynecology

## 2014-04-27 VITALS — BP 134/63 | HR 75 | Temp 98.5°F | Resp 18 | Ht 62.0 in | Wt 105.0 lb

## 2014-04-27 DIAGNOSIS — Z9221 Personal history of antineoplastic chemotherapy: Secondary | ICD-10-CM | POA: Diagnosis not present

## 2014-04-27 DIAGNOSIS — I10 Essential (primary) hypertension: Secondary | ICD-10-CM | POA: Insufficient documentation

## 2014-04-27 DIAGNOSIS — Z79899 Other long term (current) drug therapy: Secondary | ICD-10-CM | POA: Insufficient documentation

## 2014-04-27 DIAGNOSIS — C50919 Malignant neoplasm of unspecified site of unspecified female breast: Secondary | ICD-10-CM | POA: Insufficient documentation

## 2014-04-27 DIAGNOSIS — Z7982 Long term (current) use of aspirin: Secondary | ICD-10-CM | POA: Insufficient documentation

## 2014-04-27 DIAGNOSIS — C569 Malignant neoplasm of unspecified ovary: Secondary | ICD-10-CM | POA: Insufficient documentation

## 2014-04-27 DIAGNOSIS — I2510 Atherosclerotic heart disease of native coronary artery without angina pectoris: Secondary | ICD-10-CM | POA: Insufficient documentation

## 2014-04-27 DIAGNOSIS — Z8543 Personal history of malignant neoplasm of ovary: Secondary | ICD-10-CM

## 2014-04-27 DIAGNOSIS — E785 Hyperlipidemia, unspecified: Secondary | ICD-10-CM | POA: Insufficient documentation

## 2014-04-27 DIAGNOSIS — Z9010 Acquired absence of unspecified breast and nipple: Secondary | ICD-10-CM | POA: Insufficient documentation

## 2014-04-27 DIAGNOSIS — I428 Other cardiomyopathies: Secondary | ICD-10-CM | POA: Insufficient documentation

## 2014-04-27 NOTE — Patient Instructions (Signed)
Return to see [place] in one year.

## 2014-04-27 NOTE — Progress Notes (Signed)
Consult Note: Gyn-Onc   Judy Herrera 80 y.o. female  Chief Complaint  Patient presents with  . 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 Clinically NED. Marland KitchenShe will return to see [place] in one year for continuing followup.  Interval History:  The patient returns today as previously scheduled for routine followup of ovarian cancer. Since her last visit a year ago she's done well.   She denies any GI or GU symptoms. She has no abdominal bloating or pelvic pressure pain or bleeding. Functional status is good. She is up-to-date with mammograms and colonoscopy.  The "skin cancer" on her left arm continues to progress slowly.  HPI: Stage IA  clear-cell carcinoma the ovary. The patient underwent initial surgical staging followed by 3 cycles of carboplatin and Taxol chemotherapy completed in November of 2004. She's done well with no evidence of recurrent disease.  Review of Systems:10 point review of systems is negative as noted above.   Vitals: Blood pressure 134/63, pulse 75, temperature 98.5 F (36.9 C), temperature source Oral, resp. rate 18, height 5' 2"  (1.575 m), weight 105 lb (47.628 kg).  Physical Exam: General : The patient is a healthy woman in no acute distress.  HEENT: normocephalic, extraoccular movements normal; neck is supple without thyromegally  Lynphnodes: Supraclavicular and inguinal nodes not enlarged  Abdomen: Soft, non-tender, no ascites, no organomegally, no masses, no hernias  Pelvic:  EGBUS: Normal female  Vagina: Normal, no lesions  Urethra and Bladder: Normal, non-tender  Cervix: Surgically absent  Uterus: Surgically absent  Bi-manual examination: Non-tender; no adenxal masses or nodularity  Rectal: normal sphincter tone, no masses, no blood  Lower extremities: No edema or varicosities. Normal range of motion     Allergies  Allergen Reactions  . Ibuprofen Nausea Only  . Morphine Nausea And Vomiting     Past Medical History  Diagnosis Date  . Hypertension   . Allergy   . Cancer   . Ovarian ca   . Cataract   . Breast CA   . CAD (coronary artery disease)   . Hyperlipidemia   . Cardiomyopathy     improved  . Arthritis   . Osteoporosis   . History of radiation therapy 1994    left chest wall  . History of chemotherapy   . Eccrine porocarcinoma of skin     Past Surgical History  Procedure Laterality Date  . Left breast mastectomy  1994    T3 N1 Mx  . Shoulder arthroscopy    . Right leg    . Shoulder surgery      replacement  . Modified radical mastectomy w/ axillary lymph node dissection w/ pectoralis minor excision  1994    Left  . Eye surgery      CATARACTS  . Abdominal hysterectomy  2004  . Cholecystectomy  1993  . Breast surgery    . Skin biopsy  04/27/2011    left hand, forearm and posterior arm    Current Outpatient Prescriptions  Medication Sig Dispense Refill  . acyclovir (ZOVIRAX) 400 MG tablet Take 0.5 tablets (200 mg total) by mouth 3 (three) times daily as needed (for fever blisters.).  45 tablet  3  . amLODipine (NORVASC) 2.5 MG tablet TAKE 1 TABLET DAILY.  90 tablet  1  . aspirin EC 81 MG tablet Take 81 mg by mouth at bedtime.       . Calcium Carbonate-Vitamin D (  CALCIUM + D PO) Take 600 mg by mouth daily after lunch.       . carvedilol (COREG) 25 MG tablet TAKE 1 TABLET 2 TIMES A DAY WITH FOOD.  180 tablet  1  . Cholecalciferol (VITAMIN D) 2000 UNITS tablet Take 2,000 Units by mouth daily.      . Coenzyme Q10 (CO Q 10) 100 MG CAPS Take 1 capsule by mouth daily.      Marland Kitchen denosumab (PROLIA) 60 MG/ML SOLN injection Inject 60 mg into the skin every 6 (six) months. Administer in upper arm, thigh, or abdomen      . ferrous gluconate (FERGON) 324 MG tablet Take 324 mg by mouth once a week. Takes 2 tabs on empty stomach with OJ on Monday.      Marland Kitchen lisinopril (PRINIVIL,ZESTRIL) 20 MG tablet Take 1 tablet (20 mg total) by mouth at bedtime.  90 tablet  3  .  loperamide (IMODIUM) 2 MG capsule Take 1 capsule (2 mg total) by mouth as needed for diarrhea or loose stools.  20 capsule  0  . traMADol (ULTRAM) 50 MG tablet Take 1 tablet (50 mg total) by mouth every 6 (six) hours as needed.  60 tablet  5  . simvastatin (ZOCOR) 40 MG tablet Take 1 tablet (40 mg total) by mouth at bedtime.  90 tablet  3   No current facility-administered medications for this visit.    History   Social History  . Marital Status: Widowed    Spouse Name: N/A    Number of Children: 1  . Years of Education: N/A   Occupational History  . Not on file.   Social History Main Topics  . Smoking status: Never Smoker   . Smokeless tobacco: Never Used  . Alcohol Use: No  . Drug Use: No  . Sexual Activity: Not on file     Comment: didn't ask   Other Topics Concern  . Not on file   Social History Narrative  . No narrative on file    Family History  Problem Relation Age of Onset  . Breast cancer Sister     dx in her 78s  . Heart disease Sister   . Heart disease Father   . Heart disease Mother   . Stroke Mother   . Prostate cancer Maternal Uncle   . Stroke Maternal Grandmother   . Testicular cancer Maternal Grandfather   . Stroke Maternal Grandfather   . Stroke Paternal Grandmother   . Heart disease Paternal Grandfather   . Breast cancer Sister 82    BRCA2+  . Breast cancer Sister 32  . Prostate cancer Maternal Uncle   . Breast cancer Other     maternal great aunt dx later in life      CLARKE-PEARSON,Judy Minehart L, MD 04/27/2014, 9:26 AM

## 2014-05-01 ENCOUNTER — Encounter: Payer: Self-pay | Admitting: Emergency Medicine

## 2014-05-01 ENCOUNTER — Emergency Department
Admission: EM | Admit: 2014-05-01 | Discharge: 2014-05-01 | Disposition: A | Discharge status: Home / Self Care | Payer: Medicare HMO | Source: Home / Self Care | Attending: Emergency Medicine | Admitting: Emergency Medicine

## 2014-05-01 ENCOUNTER — Other Ambulatory Visit: Payer: Self-pay | Admitting: Internal Medicine

## 2014-05-01 DIAGNOSIS — N3001 Acute cystitis with hematuria: Secondary | ICD-10-CM

## 2014-05-01 DIAGNOSIS — N3000 Acute cystitis without hematuria: Secondary | ICD-10-CM

## 2014-05-01 LAB — POCT URINALYSIS DIP (MANUAL ENTRY)
Glucose, UA: 250
Nitrite, UA: POSITIVE
Protein Ur, POC: >=300
Spec Grav, UA: <=1.005 (ref 1.005–1.03)
Urobilinogen, UA: >=8 (ref 0–1)
pH, UA: 5 (ref 5–8)

## 2014-05-01 MED ORDER — CIPROFLOXACIN HCL 250 MG PO TABS: 250.0000 mg | ORAL_TABLET | Freq: Two times a day (BID) | ORAL | Status: DC

## 2014-05-01 NOTE — ED Provider Notes (Signed)
CSN: [id number]     Arrival date & time 05/01/14  1321 History   First MD Initiated Contact with Patient 05/01/14 1349     Chief Complaint  Patient presents with  . Dysuria  . Urinary Frequency    HPI Reports onset of dysuria and frequency this a.m.; this is recurring problem. Seen here in urgent care for a UTI in April and then in May. Urine culture grew out Escherichia coli. Treated with Keflex on one occasion and Bactrim on another .  Does have appointment with her Oncologist in 3 days and will discuss possible urology referral.  This is a 80 y.o. female who presents today with UTI symptoms for 1day.  + dysuria + frequency + urgency  No vaginal discharge No fever/chills + mild suprapubic, but no other abdominal pain No nausea No vomiting + minimal back pain No fatigue  Has tried Azo with minimal improvement.     Past Medical History  Diagnosis Date  . Hypertension   . Allergy   . Cancer   . Ovarian ca   . Cataract   . Breast CA   . CAD (coronary artery disease)   . Hyperlipidemia   . Cardiomyopathy     improved  . Arthritis   . Osteoporosis   . History of radiation therapy 1994    left chest wall  . History of chemotherapy   . Eccrine porocarcinoma of skin    Past Surgical History  Procedure Laterality Date  . Left breast mastectomy  1994    T3 N1 Mx  . Shoulder arthroscopy    . Right leg    . Shoulder surgery      replacement  . Modified radical mastectomy w/ axillary lymph node dissection w/ pectoralis minor excision  1994    Left  . Eye surgery      CATARACTS  . Abdominal hysterectomy  2004  . Cholecystectomy  1993  . Breast surgery    . Skin biopsy  04/27/2011    left hand, forearm and posterior arm   Family History  Problem Relation Age of Onset  . Breast cancer Sister     dx in her 36s  . Heart disease Sister   . Heart disease Father   . Heart disease Mother   . Stroke Mother   . Prostate cancer Maternal Uncle   . Stroke Maternal  Grandmother   . Testicular cancer Maternal Grandfather   . Stroke Maternal Grandfather   . Stroke Paternal Grandmother   . Heart disease Paternal Grandfather   . Breast cancer Sister 45    BRCA2+  . Breast cancer Sister 11  . Prostate cancer Maternal Uncle   . Breast cancer Other     maternal great aunt dx later in life   History  Substance Use Topics  . Smoking status: Never Smoker   . Smokeless tobacco: Never Used  . Alcohol Use: No   OB History   Grav Para Term Preterm Abortions TAB SAB Ect Mult Living                 Review of Systems  All other systems reviewed and are negative.   Allergies  Ibuprofen and Morphine  Home Medications   Prior to Admission medications   Medication Sig Start Date End Date Taking? Authorizing Provider  acyclovir (ZOVIRAX) 400 MG tablet Take 0.5 tablets (200 mg total) by mouth 3 (three) times daily as needed (for fever blisters.). 09/20/13   Doretha Sou  Burnice Logan, MD  amLODipine (NORVASC) 2.5 MG tablet TAKE 1 TABLET DAILY. 04/25/14   Marletta Lor, MD  aspirin EC 81 MG tablet Take 81 mg by mouth at bedtime.     Historical Provider, MD  Calcium Carbonate-Vitamin D (CALCIUM + D PO) Take 600 mg by mouth daily after lunch.     Historical Provider, MD  carvedilol (COREG) 25 MG tablet TAKE 1 TABLET 2 TIMES A DAY WITH FOOD. 01/29/14   Marletta Lor, MD  Cholecalciferol (VITAMIN D) 2000 UNITS tablet Take 2,000 Units by mouth daily.    Historical Provider, MD  ciprofloxacin (CIPRO) 250 MG tablet Take 1 tablet (250 mg total) by mouth 2 (two) times daily. X 5 days 05/01/14   Jacqulyn Cane, MD  Coenzyme Q10 (CO Q 10) 100 MG CAPS Take 1 capsule by mouth daily.    Historical Provider, MD  denosumab (PROLIA) 60 MG/ML SOLN injection Inject 60 mg into the skin every 6 (six) months. Administer in upper arm, thigh, or abdomen    Historical Provider, MD  ferrous gluconate (FERGON) 324 MG tablet Take 324 mg by mouth once a week. Takes 2 tabs on empty stomach  with OJ on Monday. 04/01/12   Lennis Marion Downer, MD  lisinopril (PRINIVIL,ZESTRIL) 20 MG tablet Take 1 tablet (20 mg total) by mouth at bedtime. 07/19/13   Marletta Lor, MD  loperamide (IMODIUM) 2 MG capsule Take 1 capsule (2 mg total) by mouth as needed for diarrhea or loose stools. 01/24/13   Lennis Marion Downer, MD  simvastatin (ZOCOR) 40 MG tablet Take 1 tablet (40 mg total) by mouth at bedtime. 07/19/13   Marletta Lor, MD  traMADol (ULTRAM) 50 MG tablet Take 1 tablet (50 mg total) by mouth every 6 (six) hours as needed. 02/08/14   Marletta Lor, MD   BP 119/57  Pulse 74  Temp(Src) 97.8 F (36.6 C) (Oral)  Resp 16  Ht _0  (1.549 m)  Wt 105 lb (47.628 kg)  BMI 19.85 kg/m2  SpO2 98% Physical Exam  Nursing note and vitals reviewed. Constitutional: She is oriented to person, place, and time. She appears well-developed and well-nourished. No distress.  HENT:  Mouth/Throat: Oropharynx is clear and moist.  Eyes: No scleral icterus.  Neck: Neck supple.  Cardiovascular: Normal rate, regular rhythm and normal heart sounds.   Pulmonary/Chest: Breath sounds normal.  Abdominal: Soft. She exhibits no mass. There is no hepatosplenomegaly. There is tenderness in the suprapubic area. There is no rebound, no guarding and no CVA tenderness.  Lymphadenopathy:    She has no cervical adenopathy.  Neurological: She is alert and oriented to person, place, and time.  Skin: Skin is warm and dry.    ED Course  Procedures (including critical care time) Labs Review Labs Reviewed  URINE CULTURE  URINE CULTURE  POCT URINALYSIS DIP (MANUAL ENTRY)  POCT URINALYSIS DIP (MANUAL ENTRY)   Results for orders placed during the hospital encounter of 05/01/14  POCT URINALYSIS DIP (MANUAL ENTRY)      Result Value Ref Range   Color, UA orange     Clarity, UA clear     Glucose, UA =250     Bilirubin, UA large     Bilirubin, UA small (15)     Spec Grav, UA <=1.005  1.005 - 1.03   Blood, UA  moderate     pH, UA 5.0  5 - 8   Protein Ur, POC >=300     Urobilinogen,  UA >=8.0  0 - 1   Nitrite, UA Positive     Leukocytes, UA large (3+)       Imaging Review No results found.   MDM   1. Acute cystitis with hematuria    Urinalysis shows moderate blood, positive nitrites, 3+ leukocytes.  This is her third UTI in 3 months. Treatment options discussed, as well as risks, benefits, alternatives. Patient voiced understanding and agreement with the following plans: Sent off urine culture. Cipro 250 mg by mouth twice a day x 5 days. (In my opinion, Cipro is indicated, as she had recurrences of Escherichia coli UTI with Keflex and Septra in the recent past). In my opinion, the benefits outweigh the risks of this low dose of Cipro.  Push fluids. May use Azo prn dysuria for the next day.  Keep followup appointment with Dr. Marko Plume in 3 days, to discuss urology referral. Precautions discussed. Red flags discussed. Questions invited and answered. Patient voiced understanding and agreement.     Jacqulyn Cane, MD 05/01/14 [phone]

## 2014-05-01 NOTE — ED Notes (Signed)
Reports onset of dysuria and frequency this a.m.; this is recurring problem and usually no bacteruia. Does have appointment with her Oncologist end of week and will discuss possible urology referral.

## 2014-05-02 ENCOUNTER — Encounter (HOSPITAL_BASED_OUTPATIENT_CLINIC_OR_DEPARTMENT_OTHER): Payer: Self-pay | Admitting: *Deleted

## 2014-05-02 ENCOUNTER — Ambulatory Visit: Payer: Medicare HMO | Admitting: Cardiology

## 2014-05-02 NOTE — Progress Notes (Signed)
Pt active-drives-has had recent uti-better-recent ov dr Waverly Ferrari well-labs and recent ekg

## 2014-05-03 ENCOUNTER — Other Ambulatory Visit: Payer: Self-pay

## 2014-05-03 ENCOUNTER — Other Ambulatory Visit: Payer: Self-pay | Admitting: Oncology

## 2014-05-03 DIAGNOSIS — Z853 Personal history of malignant neoplasm of breast: Secondary | ICD-10-CM

## 2014-05-04 ENCOUNTER — Other Ambulatory Visit (HOSPITAL_BASED_OUTPATIENT_CLINIC_OR_DEPARTMENT_OTHER): Payer: Medicare HMO

## 2014-05-04 ENCOUNTER — Ambulatory Visit (HOSPITAL_BASED_OUTPATIENT_CLINIC_OR_DEPARTMENT_OTHER): Payer: Medicare HMO | Admitting: Oncology

## 2014-05-04 ENCOUNTER — Encounter: Payer: Self-pay | Admitting: Oncology

## 2014-05-04 VITALS — BP 119/64 | HR 73 | Temp 97.2°F | Resp 16 | Ht 61.0 in | Wt 103.6 lb

## 2014-05-04 DIAGNOSIS — N390 Urinary tract infection, site not specified: Secondary | ICD-10-CM

## 2014-05-04 DIAGNOSIS — Z8543 Personal history of malignant neoplasm of ovary: Secondary | ICD-10-CM

## 2014-05-04 DIAGNOSIS — Z853 Personal history of malignant neoplasm of breast: Secondary | ICD-10-CM

## 2014-05-04 DIAGNOSIS — C44691 Other specified malignant neoplasm of skin of unspecified upper limb, including shoulder: Secondary | ICD-10-CM

## 2014-05-04 DIAGNOSIS — M810 Age-related osteoporosis without current pathological fracture: Secondary | ICD-10-CM

## 2014-05-04 LAB — COMPREHENSIVE METABOLIC PANEL (CC13)
ALT: 10 U/L (ref 0–55)
ANION GAP: 7 meq/L (ref 3–11)
AST: 15 U/L (ref 5–34)
Albumin: 3.5 g/dL (ref 3.5–5.0)
Alkaline Phosphatase: 45 U/L (ref 40–150)
BILIRUBIN TOTAL: 0.93 mg/dL (ref 0.20–1.20)
BUN: 20.2 mg/dL (ref 7.0–26.0)
CO2: 29 meq/L (ref 22–29)
Calcium: 9 mg/dL (ref 8.4–10.4)
Chloride: 102 mEq/L (ref 98–109)
Creatinine: 1.1 mg/dL (ref 0.6–1.1)
GLUCOSE: 88 mg/dL (ref 70–140)
Potassium: 4.3 mEq/L (ref 3.5–5.1)
Sodium: 138 mEq/L (ref 136–145)
TOTAL PROTEIN: 6 g/dL — AB (ref 6.4–8.3)

## 2014-05-04 LAB — CBC WITH DIFFERENTIAL/PLATELET
BASO%: 0.2 % (ref 0.0–2.0)
Basophils Absolute: 0 10*3/uL (ref 0.0–0.1)
EOS ABS: 0.1 10*3/uL (ref 0.0–0.5)
EOS%: 1.6 % (ref 0.0–7.0)
HEMATOCRIT: 31.3 % — AB (ref 34.8–46.6)
HEMOGLOBIN: 10.4 g/dL — AB (ref 11.6–15.9)
LYMPH%: 15.5 % (ref 14.0–49.7)
MCH: 31.8 pg (ref 25.1–34.0)
MCHC: 33.2 g/dL (ref 31.5–36.0)
MCV: 95.7 fL (ref 79.5–101.0)
MONO#: 0.4 10*3/uL (ref 0.1–0.9)
MONO%: 8.9 % (ref 0.0–14.0)
NEUT%: 73.8 % (ref 38.4–76.8)
NEUTROS ABS: 3.3 10*3/uL (ref 1.5–6.5)
PLATELETS: 118 10*3/uL — AB (ref 145–400)
RBC: 3.27 10*6/uL — ABNORMAL LOW (ref 3.70–5.45)
RDW: 12.8 % (ref 11.2–14.5)
WBC: 4.4 10*3/uL (ref 3.9–10.3)
lymph#: 0.7 10*3/uL — ABNORMAL LOW (ref 0.9–3.3)

## 2014-05-04 LAB — URINE CULTURE: Colony Count: 70000

## 2014-05-04 NOTE — Progress Notes (Signed)
OFFICE PROGRESS NOTE   05/04/2014   Physicians:P.Kwaitkowski, M.Manning, P.Lindaann Pascal, F.Lupton, B.Crenshaw, M.Martin, [facility], D.ClarkePearson.   INTERVAL HISTORY:  Patient is seen, alone for visit, in continuing attention to progressive porocarcinoma of LUE, also history of left breast cancer and ovarian cancer not known recurrent.  She saw Dr Tammi Klippel recently, with disucssion of further radiation to the area of porocarcinoma below left elbow. As this area was previously treated to 40 Gy, he is concerned that she might have a nonhealing wound from additional radiation, tho would be willing to try additional RT if warranted. She does not have pain from the skin lesions, however area below elbow now has slight purulent drainage.  She had needle biopsy of left chest nodule with fibrosis only; due to clinical concern, Dr Hassell Done plans to excise this next week.  Patient was seen in ED on 05-01-14 with acute onset of UTI symptoms. Culture returned with Klebsiella sensitive to cipro, to complete 5 day course of cipro tomorrow. She is no longer having pelvic pain or urinary frequency. She also had E coli UTIs in April and May of this year. ED physician recommended urology evaluation, and patient is in agreement with this referral now.   She has PAC, flushed 03-28-14.  ONCOLOGIC HISTORY   Eccrine porocarcinoma of skin   04/01/2012 Initial Diagnosis Eccrine porocarcinoma of skin  Patient was diagnosed with porocarcinoma involving 3 areas left hand and arm in spring 2012 by Dr.Lupton. She had re-excisions of all areas (dorsum left hand, 2 on left forearm and left arm) by Dr Ronnette Hila. She had significant lymphedema LUE after those procedures, with history of left breast cancer with 20 node left axillary dissection in 1994 followed by radiation. Repeat scans at [facility] in 05-2012 had no distant disease. 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 She had significant swelling of LUE after the limb perfusion, which gradually improved, and progression of the in transit mets LUE after the limb perfusion. She saw Dr Turner Daniels at [facility], with recommendation for gemzar/taxotere as systemic sarcoma regimen, chosen in part due to her prior chemotherapy. She had no evidence of disease on CXR at [facility] 11-23-2012. She had single cycle of dose reduced gemcitabine taxotere Feb 2014, tolerated very poorly, with hospitalization due to pancytopenia despite neulasta, requiring transfusions of PRBCs and platelets, as well as severe diarrhea and related decrease in nutritional status and general debility. PS took several months to improve back to baseline after that attempt at chemotherapy. She has not wanted any further attempt at chemotherapy for the porocarcinoma to this point. She did have good improvement in bothersome tumor nodule on left olecranon process with RT by Dr Tammi Klippel 05-2013, with 40 Gy in 10 fractions. She had additional RT to symptomatic area of the porocarcinoma 4-20 thru 03-19-14.  She had left breast cancer T3N1 (2 of 20 nodes) ER+ in 1994, treated with mastectomy and axillary node dissection, adriamycin/cytoxan x4, RT, and tamoxifen x 5 years. Last right mammogram was in [facility] at [facility] facility 05-12-13, with extremely dense breast tissue (did not have 3D tomo, tho this would be appropriate with the dense tissue).  She had IA clear cell ovarian cancer in 2004, treated with surgery followed by 3 cycles of taxol/ carboplatin. She saw Dr Josephina Shih [phone] and is to see him again in a year. She has  had no known recurrence of the gyn cancer.   Review of systems as above, also: No fever or chills. No increased SOB. Appetite ok. No diarrhea. No abdominal or pelvic pain. No problems with PAC Remainder of 10 point Review of Systems negative.  Objective:  Vital signs in  last 24 hours:  BP 119/64  Pulse 73  Temp(Src) 97.2 F (36.2 C) (Oral)  Resp 16  Ht 5' 1"  (1.549 m)  Wt 103 lb 9.6 oz (46.993 kg)  BMI 19.59 kg/m2  Alert, oriented and appropriate. Ambulatory without difficulty. Not in obvious discomfort, very pleasant as always   HEENT:PERRL, sclerae not icteric. Oral mucosa moist without lesions, posterior pharynx clear.  Neck supple. No JVD.  Lymphatics:no cervical,suraclavicular, axillary adenopathy Resp: clear to auscultation bilaterally Cardio: regular rate and rhythm. No gallop. GI: soft, nontender, not distended, no mass or organomegaly. Normally active bowel sounds.  Musculoskeletal/ Extremities: LE and RUE without pitting edema, cords, tenderness. Lymphedema LUE ~ 2+ around elbow Neuro:  Nonfocal. PSYCH normal mood and afect Skin nodular areas of porocarcinoma lower forearm mostly 3-5 mm. Largest nodule is below left elbow, with superficial purulent drainage and minimal bleeding. Above elbow RT skin changes primarily. Larger area cleaned, topical antibiotic ointment applied and dressing placed now. Breasts: Left mastectomy with 1.5 cm firm nodule at anterior axillary fold, not tender, not obviously larger.   Right breast without dominant mass, skin or nipple findings. Axillae benign. Portacath-without erythema or tenderness  Lab Results:  Results for orders placed in visit on 05/04/14  CBC WITH DIFFERENTIAL      Result Value Ref Range   WBC 4.4  3.9 - 10.3 10e3/uL   NEUT# 3.3  1.5 - 6.5 10e3/uL   HGB 10.4 (*) 11.6 - 15.9 g/dL   HCT 31.3 (*) 34.8 - 46.6 %   Platelets 118 (*) 145 - 400 10e3/uL   MCV 95.7  79.5 - 101.0 fL   MCH 31.8  25.1 - 34.0 pg   MCHC 33.2  31.5 - 36.0 g/dL   RBC 3.27 (*) 3.70 - 5.45 10e6/uL   RDW 12.8  11.2 - 14.5 %   lymph# 0.7 (*) 0.9 - 3.3 10e3/uL   MONO# 0.4  0.1 - 0.9 10e3/uL   Eosinophils Absolute 0.1  0.0 - 0.5 10e3/uL   Basophils Absolute 0.0  0.0 - 0.1 10e3/uL   NEUT% 73.8  38.4 - 76.8 %   LYMPH%  15.5  14.0 - 49.7 %   MONO% 8.9  0.0 - 14.0 %   EOS% 1.6  0.0 - 7.0 %   BASO% 0.2  0.0 - 2.0 %  COMPREHENSIVE METABOLIC PANEL (EY81)      Result Value Ref Range   Sodium 138  136 - 145 mEq/L   Potassium 4.3  3.5 - 5.1 mEq/L   Chloride 102  98 - 109 mEq/L   CO2 29  22 - 29 mEq/L   Glucose 88  70 - 140 mg/dl   BUN 20.2  7.0 - 26.0 mg/dL   Creatinine 1.1  0.6 - 1.1 mg/dL   Total Bilirubin 0.93  0.20 - 1.20 mg/dL   Alkaline Phosphatase 45  40 - 150 U/L   AST 15  5 - 34 U/L   ALT 10  0 - 55 U/L   Total Protein 6.0 (*) 6.4 - 8.3 g/dL   Albumin 3.5  3.5 - 5.0 g/dL   Calcium 9.0  8.4 - 10.4 mg/dL   Anion Gap 7  3 - 11 mEq/L     Studies/Results:  With rest of situation, she prefers not to have mammogram done, which was scheduled in late June; have cancelled the mammogram at her request and will follow with physical exam of right breast for now.  Medications: I have reviewed the patient's current medications. She will begin topical antibiotic ointment bid to the tumor nodule below left elbow.  She will complete 5 day course of cipro.  DISCUSSION: if drainage worsens will ask Dr Tammi Klippel to try additional RT. Topical antibiotic now and follow up here in 2-3 weeks. Will repeat urine culture next week to be sure most recent infection has cleared. With lymphedema and previous radiation, I doubt excision of the larger area would heal.  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 Significant local progression, tho additional RT has improved area above elbow and fortunately not any pain. Will follow with local measures for next few weeks; she has not wanted to try further chemo and probably will be most satisfied with least aggressive interventions. 2.T3N1 left breast cancer treated with surgery, RT and chemo.  3.LUE lymphedema increased since recent RT and likely also with the progressive porocarcinoma  4.IA ovarian cancer 2004. No known recurrent disease. Yearly  follow up with Dr Josephina Shih, last 04-2014 5.flu vaccine given  6.cardiomyopathy 2008. Last echo with EF 50%. Saw Dr Stanford Breed [phone]. 7.osteoporosis with vertebral compression fracture previously.  8.PAC in since attempt at chemotherapy in early 2014. Peripheral IV access is extremely poor such that PAC has been maintained.  74. Sister BRCA 2+. Patient does not have that same mutation        LIVESAY,LENNIS P, MD   05/04/2014, 11:17 AM

## 2014-05-05 ENCOUNTER — Other Ambulatory Visit: Payer: Self-pay | Admitting: Oncology

## 2014-05-05 LAB — CA 125: CA 125: 8.7 U/mL (ref 0.0–30.2)

## 2014-05-06 ENCOUNTER — Telehealth: Payer: Self-pay | Admitting: Oncology

## 2014-05-06 NOTE — Telephone Encounter (Signed)
please disregard prev mess-per pof to sch flush w/MD appt 7/13-will notify pt to be @ appt @ 9:15-will mail sch

## 2014-05-06 NOTE — Telephone Encounter (Signed)
per LL to CX trmt 7/22 & CX injc 7/27-give pt updated sch on 6/24 when pt comes in

## 2014-05-07 ENCOUNTER — Telehealth: Payer: Self-pay | Admitting: Oncology

## 2014-05-07 ENCOUNTER — Other Ambulatory Visit: Payer: Medicare HMO

## 2014-05-07 DIAGNOSIS — N390 Urinary tract infection, site not specified: Secondary | ICD-10-CM

## 2014-05-07 LAB — URINALYSIS, MICROSCOPIC - CHCC
Bilirubin (Urine): NEGATIVE
Blood: NEGATIVE
Glucose: NEGATIVE mg/dL
KETONES: NEGATIVE mg/dL
LEUKOCYTE ESTERASE: NEGATIVE
NITRITE: NEGATIVE
PROTEIN: NEGATIVE mg/dL
RBC / HPF: NEGATIVE (ref 0–2)
Specific Gravity, Urine: 1.01 (ref 1.003–1.035)
UROBILINOGEN UR: 0.2 mg/dL (ref 0.2–1)
WBC, UA: NEGATIVE (ref 0–2)
pH: 7.5 (ref 4.6–8.0)

## 2014-05-07 NOTE — Telephone Encounter (Signed)
pt here for an am appt-updated sch given to pt

## 2014-05-08 LAB — URINE CULTURE

## 2014-05-09 ENCOUNTER — Encounter (HOSPITAL_BASED_OUTPATIENT_CLINIC_OR_DEPARTMENT_OTHER): Payer: Medicare HMO | Admitting: Anesthesiology

## 2014-05-09 ENCOUNTER — Encounter (HOSPITAL_BASED_OUTPATIENT_CLINIC_OR_DEPARTMENT_OTHER): Payer: Self-pay | Admitting: Anesthesiology

## 2014-05-09 ENCOUNTER — Encounter (HOSPITAL_BASED_OUTPATIENT_CLINIC_OR_DEPARTMENT_OTHER)
Admission: RE | Disposition: A | Discharge status: Home / Self Care | Payer: Self-pay | Source: Ambulatory Visit | Attending: Surgery

## 2014-05-09 ENCOUNTER — Ambulatory Visit (HOSPITAL_BASED_OUTPATIENT_CLINIC_OR_DEPARTMENT_OTHER)
Admission: RE | Admit: 2014-05-09 | Discharge: 2014-05-09 | Disposition: A | Discharge status: Home / Self Care | Payer: Medicare HMO | Source: Ambulatory Visit | Attending: Surgery | Admitting: Surgery

## 2014-05-09 ENCOUNTER — Ambulatory Visit (HOSPITAL_BASED_OUTPATIENT_CLINIC_OR_DEPARTMENT_OTHER): Payer: Medicare HMO | Admitting: Anesthesiology

## 2014-05-09 DIAGNOSIS — Z90710 Acquired absence of both cervix and uterus: Secondary | ICD-10-CM | POA: Insufficient documentation

## 2014-05-09 DIAGNOSIS — Z9010 Acquired absence of unspecified breast and nipple: Secondary | ICD-10-CM | POA: Insufficient documentation

## 2014-05-09 DIAGNOSIS — J4489 Other specified chronic obstructive pulmonary disease: Secondary | ICD-10-CM | POA: Insufficient documentation

## 2014-05-09 DIAGNOSIS — M129 Arthropathy, unspecified: Secondary | ICD-10-CM | POA: Insufficient documentation

## 2014-05-09 DIAGNOSIS — Z96619 Presence of unspecified artificial shoulder joint: Secondary | ICD-10-CM | POA: Insufficient documentation

## 2014-05-09 DIAGNOSIS — Z7982 Long term (current) use of aspirin: Secondary | ICD-10-CM | POA: Insufficient documentation

## 2014-05-09 DIAGNOSIS — I10 Essential (primary) hypertension: Secondary | ICD-10-CM | POA: Insufficient documentation

## 2014-05-09 DIAGNOSIS — J449 Chronic obstructive pulmonary disease, unspecified: Secondary | ICD-10-CM | POA: Insufficient documentation

## 2014-05-09 DIAGNOSIS — I2510 Atherosclerotic heart disease of native coronary artery without angina pectoris: Secondary | ICD-10-CM | POA: Insufficient documentation

## 2014-05-09 DIAGNOSIS — M810 Age-related osteoporosis without current pathological fracture: Secondary | ICD-10-CM | POA: Insufficient documentation

## 2014-05-09 DIAGNOSIS — Z923 Personal history of irradiation: Secondary | ICD-10-CM | POA: Insufficient documentation

## 2014-05-09 DIAGNOSIS — D213 Benign neoplasm of connective and other soft tissue of thorax: Secondary | ICD-10-CM

## 2014-05-09 DIAGNOSIS — M799 Soft tissue disorder, unspecified: Secondary | ICD-10-CM | POA: Insufficient documentation

## 2014-05-09 DIAGNOSIS — E785 Hyperlipidemia, unspecified: Secondary | ICD-10-CM | POA: Insufficient documentation

## 2014-05-09 DIAGNOSIS — Z853 Personal history of malignant neoplasm of breast: Secondary | ICD-10-CM | POA: Insufficient documentation

## 2014-05-09 DIAGNOSIS — Z9221 Personal history of antineoplastic chemotherapy: Secondary | ICD-10-CM | POA: Insufficient documentation

## 2014-05-09 DIAGNOSIS — Z8543 Personal history of malignant neoplasm of ovary: Secondary | ICD-10-CM | POA: Insufficient documentation

## 2014-05-09 DIAGNOSIS — I428 Other cardiomyopathies: Secondary | ICD-10-CM | POA: Insufficient documentation

## 2014-05-09 DIAGNOSIS — D759 Disease of blood and blood-forming organs, unspecified: Secondary | ICD-10-CM | POA: Insufficient documentation

## 2014-05-09 DIAGNOSIS — I452 Bifascicular block: Secondary | ICD-10-CM | POA: Insufficient documentation

## 2014-05-09 HISTORY — PX: MASS EXCISION: SHX2000

## 2014-05-09 HISTORY — DX: Presence of spectacles and contact lenses: Z97.3

## 2014-05-09 HISTORY — DX: Presence of dental prosthetic device (complete) (partial): Z97.2

## 2014-05-09 SURGERY — EXCISION MASS
Anesthesia: Monitor Anesthesia Care | Site: Chest | Laterality: Left

## 2014-05-09 MED ORDER — MIDAZOLAM HCL 2 MG/2ML IJ SOLN: 1.0000 mg | INTRAMUSCULAR | Status: DC | PRN

## 2014-05-09 MED ORDER — SODIUM BICARBONATE 4 % IV SOLN
INTRAVENOUS | Status: AC
  Filled 2014-05-09: qty 5

## 2014-05-09 MED ORDER — LIDOCAINE HCL (CARDIAC) 20 MG/ML IV SOLN
INTRAVENOUS | Status: DC | PRN
  Administered 2014-05-09: 50 mg via INTRAVENOUS

## 2014-05-09 MED ORDER — SODIUM CHLORIDE 0.9 % IV SOLN: 250.0000 mL | INTRAVENOUS | Status: DC | PRN

## 2014-05-09 MED ORDER — FENTANYL CITRATE 0.05 MG/ML IJ SOLN
INTRAMUSCULAR | Status: AC
  Filled 2014-05-09: qty 2

## 2014-05-09 MED ORDER — SODIUM CHLORIDE 0.9 % IJ SOLN: 3.0000 mL | INTRAMUSCULAR | Status: DC | PRN

## 2014-05-09 MED ORDER — BUPIVACAINE HCL (PF) 0.5 % IJ SOLN
INTRAMUSCULAR | Status: DC | PRN
  Administered 2014-05-09: 3 mL

## 2014-05-09 MED ORDER — DROPERIDOL 2.5 MG/ML IJ SOLN: 0.6250 mg | INTRAMUSCULAR | Status: DC | PRN

## 2014-05-09 MED ORDER — LACTATED RINGERS IV SOLN
INTRAVENOUS | Status: DC
  Administered 2014-05-09: 09:00:00 via INTRAVENOUS

## 2014-05-09 MED ORDER — HEPARIN SODIUM (PORCINE) 5000 UNIT/ML IJ SOLN
INTRAMUSCULAR | Status: AC
  Filled 2014-05-09: qty 1

## 2014-05-09 MED ORDER — ONDANSETRON HCL 4 MG/2ML IJ SOLN
INTRAMUSCULAR | Status: DC | PRN
  Administered 2014-05-09: 4 mg via INTRAVENOUS

## 2014-05-09 MED ORDER — BUPIVACAINE HCL (PF) 0.5 % IJ SOLN
INTRAMUSCULAR | Status: AC
  Filled 2014-05-09: qty 30

## 2014-05-09 MED ORDER — PROPOFOL INFUSION 10 MG/ML OPTIME
INTRAVENOUS | Status: DC | PRN
  Administered 2014-05-09: 50 ug/kg/min via INTRAVENOUS

## 2014-05-09 MED ORDER — FENTANYL CITRATE 0.05 MG/ML IJ SOLN: 25.0000 ug | INTRAMUSCULAR | Status: DC | PRN

## 2014-05-09 MED ORDER — SODIUM BICARBONATE 4 % IV SOLN
INTRAVENOUS | Status: DC | PRN
  Administered 2014-05-09: 3 mL via INTRAVENOUS

## 2014-05-09 MED ORDER — OXYCODONE HCL 5 MG PO TABS: 5.0000 mg | ORAL_TABLET | ORAL | Status: DC | PRN

## 2014-05-09 MED ORDER — HYDROCODONE-ACETAMINOPHEN 5-325 MG PO TABS: 1.0000 | ORAL_TABLET | ORAL | Status: DC | PRN

## 2014-05-09 MED ORDER — FENTANYL CITRATE 0.05 MG/ML IJ SOLN
INTRAMUSCULAR | Status: DC | PRN
  Administered 2014-05-09 (×2): 25 ug via INTRAVENOUS

## 2014-05-09 MED ORDER — ACETAMINOPHEN 325 MG PO TABS: 650.0000 mg | ORAL_TABLET | ORAL | Status: DC | PRN

## 2014-05-09 MED ORDER — SODIUM CHLORIDE 0.9 % IJ SOLN: 3.0000 mL | Freq: Two times a day (BID) | INTRAMUSCULAR | Status: DC

## 2014-05-09 MED ORDER — HEPARIN SODIUM (PORCINE) 5000 UNIT/ML IJ SOLN
5000.0000 [IU] | Freq: Once | INTRAMUSCULAR | Status: AC
  Administered 2014-05-09: 5000 [IU] via SUBCUTANEOUS

## 2014-05-09 MED ORDER — ACETAMINOPHEN 650 MG RE SUPP: 650.0000 mg | RECTAL | Status: DC | PRN

## 2014-05-09 MED ORDER — FENTANYL CITRATE 0.05 MG/ML IJ SOLN: 50.0000 ug | INTRAMUSCULAR | Status: DC | PRN

## 2014-05-09 MED ORDER — ACETAMINOPHEN 500 MG PO TABS: 1000.0000 mg | ORAL_TABLET | Freq: Four times a day (QID) | ORAL | Status: DC

## 2014-05-09 SURGICAL SUPPLY — 47 items
ADH SKN CLS APL DERMABOND .7 (GAUZE/BANDAGES/DRESSINGS) ×1
APL SKNCLS STERI-STRIP NONHPOA (GAUZE/BANDAGES/DRESSINGS)
BENZOIN TINCTURE PRP APPL 2/3 (GAUZE/BANDAGES/DRESSINGS) IMPLANT
BLADE SURG 15 STRL LF DISP TIS (BLADE) ×1 IMPLANT
BLADE SURG 15 STRL SS (BLADE) ×2
BLADE SURG ROTATE 9660 (MISCELLANEOUS) IMPLANT
CANISTER SUCT 1200ML W/VALVE (MISCELLANEOUS) IMPLANT
CLEANER CAUTERY TIP 5X5 PAD (MISCELLANEOUS) ×1 IMPLANT
COVER MAYO STAND STRL (DRAPES) ×2 IMPLANT
COVER TABLE BACK 60X90 (DRAPES) ×2 IMPLANT
DECANTER SPIKE VIAL GLASS SM (MISCELLANEOUS) ×2 IMPLANT
DERMABOND ADVANCED (GAUZE/BANDAGES/DRESSINGS) ×1
DERMABOND ADVANCED .7 DNX12 (GAUZE/BANDAGES/DRESSINGS) IMPLANT
DRAPE PED LAPAROTOMY (DRAPES) ×1 IMPLANT
DRAPE U-SHAPE 76X120 STRL (DRAPES) IMPLANT
ELECT REM PT RETURN 9FT ADLT (ELECTROSURGICAL) ×2
ELECTRODE REM PT RTRN 9FT ADLT (ELECTROSURGICAL) ×1 IMPLANT
GLOVE BIO SURGEON STRL SZ7.5 (GLOVE) ×1 IMPLANT
GLOVE BIO SURGEON STRL SZ8 (GLOVE) ×2 IMPLANT
GLOVE BIOGEL PI IND STRL 8 (GLOVE) IMPLANT
GLOVE BIOGEL PI INDICATOR 8 (GLOVE) ×1
GLOVE EXAM NITRILE PF MED BLUE (GLOVE) ×1 IMPLANT
GOWN STRL REUS W/ TWL LRG LVL3 (GOWN DISPOSABLE) ×1 IMPLANT
GOWN STRL REUS W/ TWL XL LVL3 (GOWN DISPOSABLE) ×1 IMPLANT
GOWN STRL REUS W/TWL LRG LVL3 (GOWN DISPOSABLE)
GOWN STRL REUS W/TWL XL LVL3 (GOWN DISPOSABLE) ×4
NDL HYPO 25X1 1.5 SAFETY (NEEDLE) ×1 IMPLANT
NEEDLE 27GAX1X1/2 (NEEDLE) IMPLANT
NEEDLE HYPO 25X1 1.5 SAFETY (NEEDLE) ×2 IMPLANT
NS IRRIG 1000ML POUR BTL (IV SOLUTION) IMPLANT
PACK BASIN DAY SURGERY FS (CUSTOM PROCEDURE TRAY) ×2 IMPLANT
PAD CLEANER CAUTERY TIP 5X5 (MISCELLANEOUS)
PENCIL BUTTON HOLSTER BLD 10FT (ELECTRODE) ×2 IMPLANT
SPONGE GAUZE 4X4 12PLY STER LF (GAUZE/BANDAGES/DRESSINGS) ×1 IMPLANT
STRIP CLOSURE SKIN 1/2X4 (GAUZE/BANDAGES/DRESSINGS) IMPLANT
SUT ETHILON 3 0 FSL (SUTURE) IMPLANT
SUT ETHILON 5 0 PS 2 18 (SUTURE) ×1 IMPLANT
SUT VIC AB 4-0 SH 18 (SUTURE) ×1 IMPLANT
SUT VIC AB 5-0 PS2 18 (SUTURE) IMPLANT
SUT VICRYL 3-0 CR8 SH (SUTURE) IMPLANT
SYR BULB 3OZ (MISCELLANEOUS) ×2 IMPLANT
SYR CONTROL 10ML LL (SYRINGE) ×2 IMPLANT
TOWEL OR 17X24 6PK STRL BLUE (TOWEL DISPOSABLE) ×2 IMPLANT
TRAY DSU PREP LF (CUSTOM PROCEDURE TRAY) ×2 IMPLANT
TUBE CONNECTING 20X1/4 (TUBING) IMPLANT
UNDERPAD 30X30 INCONTINENT (UNDERPADS AND DIAPERS) ×1 IMPLANT
YANKAUER SUCT BULB TIP NO VENT (SUCTIONS) IMPLANT

## 2014-05-09 NOTE — Discharge Instructions (Signed)
Call for pathology report on Friday. [phone] Follow up in Dr. Earlie Server office in 2 weeks. Call office to schedule appointment. [phone] May shower.  Activity as tolerated.  Call your surgeon if you experience:   1.  Fever over 101.0. 2.  Inability to urinate. 3.  Nausea and/or vomiting. 4.  Extreme swelling or bruising at the surgical site. 5.  Continued bleeding from the incision. 6.  Increased pain, redness or drainage from the incision. 7.  Problems related to your pain medication.  Post Anesthesia Home Care Instructions  Activity: Get plenty of rest for the remainder of the day. A responsible adult should stay with you for 24 hours following the procedure.  For the next 24 hours, DO NOT: -Drive a car -Paediatric nurse -Drink alcoholic beverages -Take any medication unless instructed by your physician -Make any legal decisions or sign important papers.  Meals: Start with liquid foods such as gelatin or soup. Progress to regular foods as tolerated. Avoid greasy, spicy, heavy foods. If nausea and/or vomiting occur, drink only clear liquids until the nausea and/or vomiting subsides. Call your physician if vomiting continues.  Special Instructions/Symptoms: Your throat may feel dry or sore from the anesthesia or the breathing tube placed in your throat during surgery. If this causes discomfort, gargle with warm salt water. The discomfort should disappear within 24 hours.

## 2014-05-09 NOTE — Interval H&P Note (Signed)
History and Physical Interval Note:  05/09/2014 10:50 AM  Judy Herrera  has presented today for surgery, with the diagnosis of CHEST WALL NODULE  The various methods of treatment have been discussed with the patient and family. After consideration of risks, benefits and other options for treatment, the patient has consented to  Procedure(s): EXCISION OF NODULE LEFT CHEST  (Left) as a surgical intervention .  The patient's history has been reviewed, patient examined, no change in status, stable for surgery.  I have reviewed the patient's chart and labs.  Questions were answered to the patient's satisfaction.     MARTIN,MATTHEW B

## 2014-05-09 NOTE — H&P (View-Only) (Signed)
Chief Complaint:  Nodule on mastectomy site, left  History of Present Illness:  Judy Herrera is an 80 y.o. female who has had some long term complications of her breast cancer and radiation therapy on the left side. The nodule that I biopsied came back fibrosis and muscle and no evidence of cancer. This nodule remains in a think it's worrisome in light of her history so I think it needs to be excised. We'll go ahead and set up for excision under local [facility].  Past Medical History  Diagnosis Date  . Hypertension   . Allergy   . Cancer   . Ovarian ca   . Cataract   . Breast CA   . CAD (coronary artery disease)   . Hyperlipidemia   . Cardiomyopathy     improved  . Arthritis   . Osteoporosis   . History of radiation therapy 1994    left chest wall  . History of chemotherapy   . Eccrine porocarcinoma of skin     Past Surgical History  Procedure Laterality Date  . Left breast mastectomy  1994    T3 N1 Mx  . Shoulder arthroscopy    . Right leg    . Shoulder surgery      replacement  . Modified radical mastectomy w/ axillary lymph node dissection w/ pectoralis minor excision  1994    Left  . Eye surgery      CATARACTS  . Abdominal hysterectomy  2004  . Cholecystectomy  1993  . Breast surgery    . Skin biopsy  04/27/2011    left hand, forearm and posterior arm    Current Outpatient Prescriptions  Medication Sig Dispense Refill  . acyclovir (ZOVIRAX) 400 MG tablet Take 0.5 tablets (200 mg total) by mouth 3 (three) times daily as needed (for fever blisters.).  45 tablet  3  . amLODipine (NORVASC) 2.5 MG tablet TAKE 1 TABLET DAILY.  90 tablet  3  . aspirin EC 81 MG tablet Take 81 mg by mouth at bedtime.       . Calcium Carbonate-Vitamin D (CALCIUM + D PO) Take 600 mg by mouth daily after lunch.       . carvedilol (COREG) 25 MG tablet TAKE 1 TABLET 2 TIMES A DAY WITH FOOD.  180 tablet  1  . Cholecalciferol (VITAMIN D) 2000 UNITS tablet Take 2,000 Units by mouth  daily.      . Coenzyme Q10 (CO Q 10) 100 MG CAPS Take 1 capsule by mouth daily.      Marland Kitchen denosumab (PROLIA) 60 MG/ML SOLN injection Inject 60 mg into the skin every 6 (six) months. Administer in upper arm, thigh, or abdomen      . ferrous gluconate (FERGON) 324 MG tablet Take 324 mg by mouth once a week. Takes 2 tabs on empty stomach with OJ on Monday.      Marland Kitchen lisinopril (PRINIVIL,ZESTRIL) 20 MG tablet Take 1 tablet (20 mg total) by mouth at bedtime.  90 tablet  3  . loperamide (IMODIUM) 2 MG capsule Take 1 capsule (2 mg total) by mouth as needed for diarrhea or loose stools.  20 capsule  0  . simvastatin (ZOCOR) 40 MG tablet Take 1 tablet (40 mg total) by mouth at bedtime.  90 tablet  3  . sulfamethoxazole-trimethoprim (BACTRIM,SEPTRA) 400-80 MG per tablet Take 1 tablet by mouth 2 (two) times daily.  10 tablet  0  . traMADol (ULTRAM) 50 MG tablet  Take 1 tablet (50 mg total) by mouth every 6 (six) hours as needed.  60 tablet  5   No current facility-administered medications for this visit.   Ibuprofen and Morphine Family History  Problem Relation Age of Onset  . Breast cancer Sister     dx in her 32s  . Heart disease Sister   . Heart disease Father   . Heart disease Mother   . Stroke Mother   . Prostate cancer Maternal Uncle   . Stroke Maternal Grandmother   . Testicular cancer Maternal Grandfather   . Stroke Maternal Grandfather   . Stroke Paternal Grandmother   . Heart disease Paternal Grandfather   . Breast cancer Sister 40    BRCA2+  . Breast cancer Sister 37  . Prostate cancer Maternal Uncle   . Breast cancer Other     maternal great aunt dx later in life   Social History:   reports that she has never smoked. She has never used smokeless tobacco. She reports that she does not drink alcohol or use illicit drugs.   REVIEW OF SYSTEMS - PERTINENT POSITIVES ONLY: noncontributory  Physical Exam:   Blood pressure 98/62, pulse 80, temperature 98.1 F (36.7 C), temperature source  Oral, resp. rate 16, weight 103 lb 3.2 oz (46.811 kg). Body mass index is 18.87 kg/(m^2).  Gen:  WDWN white female NAD  Neurological: Alert and oriented to person, place, and time. Motor and sensory function is grossly intact  Head: Normocephalic and atraumatic.  Eyes: Conjunctivae are normal. Pupils are equal, round, and reactive to light. No scleral icterus.  Neck: Normal range of motion. Neck supple. No tracheal deviation or thyromegaly present.  Cardiovascular:  SR without murmurs or gallops.  No carotid bruits Respiratory: Effort normal.  No respiratory distress. No chest wall tenderness. Breath sounds normal.  No wheezes, rales or rhonchi.  Breast:  Left mastectomy site with nodule along the pectoral border. GU: Musculoskeletal: Normal range of motion. Extremities are nontender. No cyanosis, edema or clubbing noted Lymphadenopathy: No cervical, preauricular, postauricular or axillary adenopathy is present Skin: Skin is warm and dry. No rash noted. No diaphoresis. No erythema. No pallor. Pscyh: Normal mood and affect. Behavior is normal. Judgment and thought content normal.   LABORATORY RESULTS: No results found for this or any previous visit (from the past 48 hour(s)).  RADIOLOGY RESULTS: No results found.  Problem List: Patient Active Problem List   Diagnosis Date Noted  . Other bilateral bundle branch block 01/12/2014  . 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 TENDINITIS, RIGHT WRIST 11/12/2010  . WRIST PAIN, RIGHT 11/05/2010  . SYNCOPE 09/23/2010  . BENIGN NEOPLASM SKIN OTHER&UNSPEC PARTS FACE 08/06/2010  . BACK PAIN 07/03/2010  . HYPERLIPIDEMIA 01/15/2010  . OTHER PRIMARY CARDIOMYOPATHIES 01/15/2010  . ALLERGIC RHINITIS 08/22/2009  . UTI 02/16/2008  . OSTEOPOROSIS 02/16/2008  . COPD 02/04/2008  . UPPER RESPIRATORY INFECTION, VIRAL 09/08/2007  . HYPERTENSION 07/13/2007  . CORONARY ARTERY DISEASE 07/13/2007  . BREAST CANCER, HX OF 07/13/2007  . COLONIC POLYPS, HX  OF 07/13/2007    Assessment & Plan:  Will excise a nodule along the pectoral border on the left side.    Matt B. Hassell Done, MD, [facility] [phone] beeper [phone]  04/13/2014 4:28 PM

## 2014-05-09 NOTE — Op Note (Signed)
Surgeon: Kaylyn Lim, MD, FACS  Asst:  none  Anes:  MAC with marcaine local  Procedure: Excision of left chest wall mass in site of prior mastectomy  Diagnosis: Path pending  Complications: none  EBL:   minimal cc  Drains: none  Description of Procedure:  The patient was taken to OR 5 at [facility].  After anesthesia was administered and the patient was prepped a timeout was performed.  The area in question was infiltrated with Marcaine and Neut.  A transverse incision was made and the bluish nodule was resected from the underlying pectoral muscle remnant.  It was sent for permanents.  The wound was closed with 4-0 Vicryl and Dermabond.    The patient tolerated the procedure well and was taken to the PACU in stable condition.     Matt B. Hassell Done, [place], [facility], [place]

## 2014-05-09 NOTE — Anesthesia Postprocedure Evaluation (Signed)
  Anesthesia Post-op Note  Patient: Judy Herrera  Procedure(s) Performed: Procedure(s): EXCISION OF NODULE LEFT CHEST  (Left)  Patient Location: PACU  Anesthesia Type:MAC  Level of Consciousness: awake, alert , oriented and patient cooperative  Airway and Oxygen Therapy: Patient Spontanous Breathing  Post-op Pain: none  Post-op Assessment: Post-op Vital signs reviewed, Patient's Cardiovascular Status Stable, Respiratory Function Stable, Patent Airway, No signs of Nausea or vomiting and Pain level controlled  Post-op Vital Signs: Reviewed and stable  Last Vitals:  Filed Vitals:   05/09/14 1245  BP: 150/61  Pulse: 82  Temp: 36.8 C  Resp: 16    Complications: No apparent anesthesia complications

## 2014-05-09 NOTE — Transfer of Care (Signed)
Immediate Anesthesia Transfer of Care Note  Patient: Judy Herrera  Procedure(s) Performed: Procedure(s): EXCISION OF NODULE LEFT CHEST  (Left)  Patient Location: PACU  Anesthesia Type:MAC  Level of Consciousness: awake, oriented and patient cooperative  Airway & Oxygen Therapy: Patient Spontanous Breathing and Patient connected to face mask oxygen  Post-op Assessment: Report given to PACU RN and Post -op Vital signs reviewed and stable  Post vital signs: Reviewed and stable  Complications: No apparent anesthesia complications

## 2014-05-09 NOTE — Anesthesia Preprocedure Evaluation (Addendum)
Anesthesia Evaluation  Patient identified by MRN, date of birth, ID band Patient awake    Reviewed: Allergy & Precautions, H&P , NPO status , Patient's Chart, lab work & pertinent test results, reviewed documented beta blocker date and time   History of Anesthesia Complications Negative for: history of anesthetic complications  Airway Mallampati: II TM Distance: >3 FB Neck ROM: Full    Dental  (+) Partial Upper, Partial Lower, Dental Advisory Given   Pulmonary COPD breath sounds clear to auscultation  Pulmonary exam normal       Cardiovascular hypertension, Pt. on medications and Pt. on home beta blockers - angina+ CAD (cath '06: mild ASCADz, EF 45%) Rhythm:Regular Rate:Normal  5/14 ECHO: EF 50-55%, valves OK   Neuro/Psych negative neurological ROS     GI/Hepatic negative GI ROS, Neg liver ROS,   Endo/Other  negative endocrine ROS  Renal/GU negative Renal ROS     Musculoskeletal   Abdominal   Peds  Hematology  (+) Blood dyscrasia (Hb 10.4, plt 118K), anemia ,   Anesthesia Other Findings Breast cancer, ovarian cancer: chemo, surgery  Reproductive/Obstetrics                        Anesthesia Physical Anesthesia Plan  ASA: III  Anesthesia Plan: MAC   Post-op Pain Management:    Induction: Intravenous  Airway Management Planned: Natural Airway and Simple Face Mask  Additional Equipment:   Intra-op Plan:   Post-operative Plan:   Informed Consent: I have reviewed the patients History and Physical, chart, labs and discussed the procedure including the risks, benefits and alternatives for the proposed anesthesia with the patient or authorized representative who has indicated his/her understanding and acceptance.   Dental advisory given  Plan Discussed with: CRNA and Surgeon  Anesthesia Plan Comments: (Plan routine monitors, MAC)        Anesthesia Quick Evaluation

## 2014-05-10 ENCOUNTER — Telehealth: Payer: Self-pay

## 2014-05-10 ENCOUNTER — Encounter (HOSPITAL_BASED_OUTPATIENT_CLINIC_OR_DEPARTMENT_OTHER): Payer: Self-pay | Admitting: Surgery

## 2014-05-10 NOTE — Telephone Encounter (Signed)
Told Judy Herrera the results of the urine culture as noted below by Dr. Marko Plume.  Pt. Verbalized understanding.

## 2014-05-10 NOTE — Telephone Encounter (Signed)
Message copied by Baruch Merl on Thu May 10, 2014 11:40 AM ------      Message from: Gordy Levan      Created: Wed May 09, 2014 12:39 PM       Labs seen and need follow up please let her know no infection in urine ------

## 2014-05-11 ENCOUNTER — Telehealth (INDEPENDENT_AMBULATORY_CARE_PROVIDER_SITE_OTHER): Payer: Self-pay | Admitting: *Deleted

## 2014-05-11 NOTE — Telephone Encounter (Signed)
Pt called into triage office.  She said she was advised to call in the office today to get her pathology report from the nodule that was removed from her chest.   I looked and advised pt that it was not cancer and that Dr. Hassell Done would go over the results at her p/o appt.  Pt verbalized understanding.  Anderson Malta

## 2014-05-14 ENCOUNTER — Ambulatory Visit (HOSPITAL_BASED_OUTPATIENT_CLINIC_OR_DEPARTMENT_OTHER): Payer: Medicare HMO

## 2014-05-25 ENCOUNTER — Encounter (INDEPENDENT_AMBULATORY_CARE_PROVIDER_SITE_OTHER): Payer: Medicare HMO | Admitting: Surgery

## 2014-05-27 ENCOUNTER — Other Ambulatory Visit: Payer: Self-pay | Admitting: Oncology

## 2014-05-27 DIAGNOSIS — C4499 Other specified malignant neoplasm of skin, unspecified: Secondary | ICD-10-CM

## 2014-05-28 ENCOUNTER — Ambulatory Visit (HOSPITAL_BASED_OUTPATIENT_CLINIC_OR_DEPARTMENT_OTHER): Payer: Medicare HMO | Admitting: Oncology

## 2014-05-28 ENCOUNTER — Encounter: Payer: Self-pay | Admitting: Oncology

## 2014-05-28 ENCOUNTER — Telehealth: Payer: Self-pay | Admitting: Oncology

## 2014-05-28 ENCOUNTER — Ambulatory Visit (HOSPITAL_BASED_OUTPATIENT_CLINIC_OR_DEPARTMENT_OTHER): Payer: Medicare HMO

## 2014-05-28 ENCOUNTER — Other Ambulatory Visit (HOSPITAL_BASED_OUTPATIENT_CLINIC_OR_DEPARTMENT_OTHER): Payer: Medicare HMO

## 2014-05-28 ENCOUNTER — Other Ambulatory Visit: Payer: Medicare HMO

## 2014-05-28 VITALS — BP 136/66 | HR 77 | Temp 97.9°F | Resp 18 | Ht 61.0 in | Wt 104.5 lb

## 2014-05-28 DIAGNOSIS — I890 Lymphedema, not elsewhere classified: Secondary | ICD-10-CM

## 2014-05-28 DIAGNOSIS — Z853 Personal history of malignant neoplasm of breast: Secondary | ICD-10-CM

## 2014-05-28 DIAGNOSIS — I428 Other cardiomyopathies: Secondary | ICD-10-CM

## 2014-05-28 DIAGNOSIS — M810 Age-related osteoporosis without current pathological fracture: Secondary | ICD-10-CM

## 2014-05-28 DIAGNOSIS — C44621 Squamous cell carcinoma of skin of unspecified upper limb, including shoulder: Secondary | ICD-10-CM

## 2014-05-28 DIAGNOSIS — Z95828 Presence of other vascular implants and grafts: Secondary | ICD-10-CM

## 2014-05-28 DIAGNOSIS — C4499 Other specified malignant neoplasm of skin, unspecified: Secondary | ICD-10-CM

## 2014-05-28 DIAGNOSIS — Z8543 Personal history of malignant neoplasm of ovary: Secondary | ICD-10-CM

## 2014-05-28 LAB — CBC WITH DIFFERENTIAL/PLATELET
BASO%: 0.8 % (ref 0.0–2.0)
Basophils Absolute: 0 10*3/uL (ref 0.0–0.1)
EOS ABS: 0.1 10*3/uL (ref 0.0–0.5)
EOS%: 2 % (ref 0.0–7.0)
HEMATOCRIT: 30.9 % — AB (ref 34.8–46.6)
HGB: 10.5 g/dL — ABNORMAL LOW (ref 11.6–15.9)
LYMPH%: 16.6 % (ref 14.0–49.7)
MCH: 32.2 pg (ref 25.1–34.0)
MCHC: 34 g/dL (ref 31.5–36.0)
MCV: 94.9 fL (ref 79.5–101.0)
MONO#: 0.4 10*3/uL (ref 0.1–0.9)
MONO%: 8.7 % (ref 0.0–14.0)
NEUT%: 71.9 % (ref 38.4–76.8)
NEUTROS ABS: 3.1 10*3/uL (ref 1.5–6.5)
PLATELETS: 142 10*3/uL — AB (ref 145–400)
RBC: 3.25 10*6/uL — ABNORMAL LOW (ref 3.70–5.45)
RDW: 13.4 % (ref 11.2–14.5)
WBC: 4.3 10*3/uL (ref 3.9–10.3)
lymph#: 0.7 10*3/uL — ABNORMAL LOW (ref 0.9–3.3)

## 2014-05-28 MED ORDER — HEPARIN SOD (PORK) LOCK FLUSH 100 UNIT/ML IV SOLN
500.0000 [IU] | Freq: Once | INTRAVENOUS | Status: AC
  Administered 2014-05-28: 500 [IU] via INTRAVENOUS
  Filled 2014-05-28: qty 5

## 2014-05-28 MED ORDER — SODIUM CHLORIDE 0.9 % IJ SOLN
10.0000 mL | INTRAMUSCULAR | Status: DC | PRN
  Administered 2014-05-28: 10 mL via INTRAVENOUS
  Filled 2014-05-28: qty 10

## 2014-05-28 NOTE — Telephone Encounter (Signed)
per pof to sch pt appts-gave pt copy of sch °

## 2014-05-28 NOTE — Patient Instructions (Signed)

## 2014-05-28 NOTE — Progress Notes (Signed)
OFFICE PROGRESS NOTE   05/28/2014   Physicians:P.Kwaitkowski, M.Manning, P.Lindaann Pascal, F.Lupton, B.Crenshaw, M.Martin, [facility], D.ClarkePearson.   INTERVAL HISTORY:  Patient is seen, alone for visit, in scheduled follow up of porocarcinoma involving LUE, also history of left breast cancer, ovarian cancer and baseline lymphedema LUE related to breast cancer treatement. Treatment interventions are limited for the porocarcinoma due to poor tolerance of chemotherapy, total amounts of radiation and comorbid problems with LUE. She had excisional biopsy of a new firm nodule left anterior chest by Dr Hassell Done on 05-09-14, which fortunately showed only fibrous tissue without recurrent breast cancer, additional porocarcinoma or other. Also fortunately that area has healed quickly and well, with no discomfort. Patient's primary concern today is of skin tear left forearm which happened yesterday when she bumped into a cabinet. She has washed area and used topical antibiotic, but notices tenderness and slight increase in swelling below the wound. Otherwise the largest area of porocarcinoma at elbow has some minimal bleeding occasionally and is uncomfortable with direct pressure, but overall not worse that she can tell. She has felt better overall otherwise, with more energy and appetite at baseline.  She is to see Dr Nonie Hoyer in August. Since chest wall lesion is entirely healed and benign, she wonders if she needs to see Dr Hassell Done later this week as scheduled.   She has PAC, flushed today.  ONCOLOGIC HISTORY   Eccrine porocarcinoma of skin   04/01/2012 Initial Diagnosis Eccrine porocarcinoma of skin  Patient was diagnosed with porocarcinoma involving 3 areas left hand and arm in spring 2012 by Dr.Lupton. She had re-excisions of all areas (dorsum left hand, 2 on left forearm and left arm) by Dr Ronnette Hila. She had significant lymphedema LUE after those procedures, with history of left breast  cancer with 20 node left axillary dissection in 1994 followed by radiation. Repeat scans at [facility] in 05-2012 had no distant disease. With progressive in transit mets, she had hyperthermic limb perfusion by Dr Clovis Riley at [facility] in 06-2012 and excision of an area on dorsum of left hand in Oct 2013. She had significant swelling of LUE after the limb perfusion, which gradually improved, and progression of the in transit mets LUE after the limb perfusion. She saw Dr Turner Daniels at [facility], with recommendation for gemzar/taxotere as systemic sarcoma regimen, chosen in part due to her prior chemotherapy. She had no evidence of disease on CXR at [facility] 11-23-2012. She had single cycle of dose reduced gemcitabine taxotere Feb 2014, tolerated very poorly, with hospitalization due to pancytopenia despite neulasta, requiring transfusions of PRBCs and platelets, as well as severe diarrhea and related decrease in nutritional status and general debility. PS took several months to improve back to baseline after that attempt at chemotherapy. She has not wanted any further attempt at chemotherapy for the porocarcinoma to this point. She did have good improvement in bothersome tumor nodule on left olecranon process with RT by Dr Tammi Klippel 05-2013, with 40 Gy in 10 fractions. She had additional RT to symptomatic area of the porocarcinoma 4-20 thru 03-19-14.   She had left breast cancer T3N1 (2 of 20 nodes) ER+ in 1994, treated with mastectomy and axillary node dissection, adriamycin/cytoxan x4, RT, and tamoxifen x 5 years. Last right mammogram was in [facility] facility 05-12-13, with extremely dense breast tissue (did not have 3D tomo, tho this would be appropriate with the dense tissue).   She had IA clear cell ovarian cancer in 2004, treated with surgery  followed by 3 cycles of taxol/ carboplatin. She saw Dr Josephina Shih [phone] and is to see him again in a year. She has had no known recurrence of the gyn cancer. Last  CA 125 05-04-14 was stable at 8.7  Sister is BRCA2 +, however patient is not, even with retesting.  Review of systems as above, also: No SOB or other respiratory symptoms. No chest pain. No LE swelling. No fever or symptoms of infection. Bowels are fine, no abdominal or pelvic symptoms.  Remainder of 10 point Review of Systems negative.  Objective:  Vital signs in last 24 hours:  BP 136/66  Pulse 77  Temp(Src) 97.9 F (36.6 C) (Oral)  Resp 18  Ht 5' 1" (1.549 m)  Wt 104 lb 8 oz (47.401 kg)  BMI 19.76 kg/m2 weight is up 3 lbs  Alert, oriented and appropriate, looks comfortable.  Ambulatory without difficulty.    HEENT:PERRL, sclerae not icteric. Oral mucosa moist without lesions, posterior pharynx clear.  Neck supple. No JVD.  Lymphatics:no cervical,suraclavicular, axillary adenopathy Resp: clear to auscultation bilaterally and normal percussion bilaterally Cardio: regular rate and rhythm. No gallop. GI: soft, nontender, not distended. Normally active bowel sounds.  Musculoskeletal/ Extremities: LUE with multiple in transit porcarcinoma nodules, most 2-4 mm and primarily on forearm, with largest area at elbow laterally ~ 2.5 - 3 cm diameter with nodular surface not epithelialized, small exudate, no active bleeding. Skin tear 1x1.5 cm clean on forearm, with no drainage, minimal pink slightly puffy below over several cm,, no heat. Overall LUE swelling is less today than in recent past. Other extremities without pitting edema, cords, tenderness Neuro: no peripheral neuropathy. Otherwise nonfocal Skin as above. Site of excisional biopsy on left chest wall just medial to anterior axillary fold is well healed with tiny scar, nothing palpable, no tenderness or swelling. Portacath-without erythema or tenderness  Lab Results:  Results for orders placed in visit on 05/28/14  CBC WITH DIFFERENTIAL      Result Value Ref Range   WBC 4.3  3.9 - 10.3 10e3/uL   NEUT# 3.1  1.5 - 6.5 10e3/uL    HGB 10.5 (*) 11.6 - 15.9 g/dL   HCT 30.9 (*) 34.8 - 46.6 %   Platelets 142 (*) 145 - 400 10e3/uL   MCV 94.9  79.5 - 101.0 fL   MCH 32.2  25.1 - 34.0 pg   MCHC 34.0  31.5 - 36.0 g/dL   RBC 3.25 (*) 3.70 - 5.45 10e6/uL   RDW 13.4  11.2 - 14.5 %   lymph# 0.7 (*) 0.9 - 3.3 10e3/uL   MONO# 0.4  0.1 - 0.9 10e3/uL   Eosinophils Absolute 0.1  0.0 - 0.5 10e3/uL   Basophils Absolute 0.0  0.0 - 0.1 10e3/uL   NEUT% 71.9  38.4 - 76.8 %   LYMPH% 16.6  14.0 - 49.7 %   MONO% 8.7  0.0 - 14.0 %   EOS% 2.0  0.0 - 7.0 %   BASO% 0.8  0.0 - 2.0 %     Studies/Results:  SHERALEE, QAZI Collected: 05/09/2014 Client: [place] [facility] Accession: [id number] Received: 05/09/2014 Johnathan Hausen REPORT OF SURGICAL PATHOLOGY FINAL DIAGNOSIS Diagnosis Soft tissue mass, simple excision, Right previous mastectomy site - SKELETAL MUSCLE WITH HEMORRHAGE AND FIBROSIS. - NO EVIDENCE OF MALIGNANCY. Microscopic Comment There is a nodule of hemorrhage associated with organizing fibrosis and reactive changes within the surrounding skeletal muscle. There is no evidence of malignancy.  Medications: I have reviewed the patient's  current medications.  DISCUSSION: I have spoken with Dr Lu Duffel nurse to let them know that the area of biopsy looks great; his appointment 05-31-14 cancelled.  Patient and I have reviewed options for the porocarcinoma: local management with dressings and observation, vs additional chemotherapy which she does not want, vs additional RT by Dr Tammi Klippel including to left elbow if local symptoms bothersome. She wants to do least necessary now. RN has redressed left elbow.  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 Significant local progression, tho additional RT has improved area above elbow and fortunately not any pain. Will follow with local measures; she has not wanted to try further chemo and probably will be most satisfied with least aggressive  interventions. Dr Tammi Klippel could retreat elbow if significant symptoms, tho there is some concern about total dose of radiation there. 2.T3N1 left breast cancer treated with surgery, RT and chemo.  3.LUE lymphedema increased since recent RT and likely also with the progressive porocarcinoma  4.IA ovarian cancer 2004. No known recurrent disease. Yearly follow up with Dr Josephina Shih, last 04-2014  5.skin tear LUE: she will call if increased painful swelling or redness related to this, but looks clean and ok now. Chronic lymphedema and other problems LUE impacting this otherwise minimal trauma. 6.cardiomyopathy 2008. Last echo with EF 50%. Saw Dr Stanford Breed [phone].  7.osteoporosis with vertebral compression fracture previously.  8.PAC in since attempt at chemotherapy in early 2014. Peripheral IV access is extremely poor such that PAC has been maintained.  46. Sister BRCA 2+. Patient does not have that same mutation 10. Excisional biopsy of left chest nodule by Dr Hassell Done 04-2013, benign and already well healed   She will see PCP as planned in August. I will coordinate my next visit with PAC flush, or will see her any time prior if needed. TIme spent 25 min including >50% counseling and coordination of care.   , P, MD   05/28/2014, 10:08 AM

## 2014-05-31 ENCOUNTER — Encounter (INDEPENDENT_AMBULATORY_CARE_PROVIDER_SITE_OTHER): Payer: Medicare HMO | Admitting: Surgery

## 2014-06-09 ENCOUNTER — Other Ambulatory Visit: Payer: Self-pay | Admitting: Internal Medicine

## 2014-06-20 ENCOUNTER — Ambulatory Visit (HOSPITAL_BASED_OUTPATIENT_CLINIC_OR_DEPARTMENT_OTHER)
Admission: RE | Admit: 2014-06-20 | Discharge: 2014-06-20 | Disposition: A | Discharge status: Home / Self Care | Payer: Medicare HMO | Source: Ambulatory Visit | Attending: Oncology | Admitting: Oncology

## 2014-06-20 DIAGNOSIS — Z853 Personal history of malignant neoplasm of breast: Secondary | ICD-10-CM | POA: Insufficient documentation

## 2014-06-20 DIAGNOSIS — Z1231 Encounter for screening mammogram for malignant neoplasm of breast: Secondary | ICD-10-CM | POA: Diagnosis not present

## 2014-07-09 ENCOUNTER — Encounter: Payer: Self-pay | Admitting: Internal Medicine

## 2014-07-09 ENCOUNTER — Encounter: Payer: Self-pay | Admitting: *Deleted

## 2014-07-09 ENCOUNTER — Ambulatory Visit (INDEPENDENT_AMBULATORY_CARE_PROVIDER_SITE_OTHER): Payer: Medicare HMO | Admitting: Internal Medicine

## 2014-07-09 VITALS — BP 112/60 | HR 71 | Temp 97.9°F | Resp 18 | Ht 61.0 in | Wt 107.0 lb

## 2014-07-09 DIAGNOSIS — C4499 Other specified malignant neoplasm of skin, unspecified: Secondary | ICD-10-CM

## 2014-07-09 DIAGNOSIS — I10 Essential (primary) hypertension: Secondary | ICD-10-CM

## 2014-07-09 DIAGNOSIS — Z86010 Personal history of colonic polyps: Secondary | ICD-10-CM

## 2014-07-09 DIAGNOSIS — E785 Hyperlipidemia, unspecified: Secondary | ICD-10-CM

## 2014-07-09 MED ORDER — HYDROCODONE-ACETAMINOPHEN 5-325 MG PO TABS: 1.0000 | ORAL_TABLET | ORAL | Status: DC | PRN

## 2014-07-09 NOTE — Patient Instructions (Signed)
Limit your sodium (Salt) intake  Return in 6 months for follow-up  

## 2014-07-09 NOTE — Progress Notes (Signed)
Pre visit review using our clinic review tool, if applicable. No additional management support is needed unless otherwise documented below in the visit note. 

## 2014-07-09 NOTE — Progress Notes (Signed)
Subjective:    Patient ID: Judy Herrera, female    DOB: 1931/07/11, 80 y.o.   MRN: [id number]  HPI 80 year old patient who is seen today for her six-month followup.  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 Significant local progression, tho additional RT has improved area above elbow and fortunately not any pain. Her cardiovascular status remained stable.  She has coronary artery disease, hypertension, and dyslipidemia.  She remains on statin therapy, which she continues to tolerate well Her left arm is about the same with chronic lymphedema.  She does have a single open approximately 2 cm metastatic lesion that oozes from time to time , and she keeps band is out in public  Past Medical History  Diagnosis Date  . Hypertension   . Allergy   . Cancer   . Ovarian ca   . Cataract   . Breast CA   . CAD (coronary artery disease)   . Hyperlipidemia   . Cardiomyopathy     improved  . Arthritis   . Osteoporosis   . History of radiation therapy 1994    left chest wall  . History of chemotherapy   . Eccrine porocarcinoma of skin   . Wears glasses   . Wears dentures     full bottom-partial top    History   Social History  . Marital Status: Widowed    Spouse Name: N/A    Number of Children: 1  . Years of Education: N/A   Occupational History  . Not on file.   Social History Main Topics  . Smoking status: Never Smoker   . Smokeless tobacco: Never Used  . Alcohol Use: No  . Drug Use: No  . Sexual Activity: Not on file     Comment: didn't ask   Other Topics Concern  . Not on file   Social History Narrative  . No narrative on file    Past Surgical History  Procedure Laterality Date  . Left breast mastectomy  1994    T3 N1 Mx  . Shoulder arthroscopy    . Right leg  1990    fx  . Shoulder surgery  1990    replacement-lt  . Modified radical mastectomy w/ axillary lymph node dissection w/ pectoralis minor  excision  1994    Left  . Eye surgery      CATARACTS  . Abdominal hysterectomy  2004  . Cholecystectomy  1993  . Breast surgery    . Skin biopsy  04/27/2011    left hand, forearm and posterior arm  . Mass excision Left 05/09/2014    Procedure: EXCISION OF NODULE LEFT CHEST ;  Surgeon: Pedro Earls, MD;  Location: [facility];  Service: General;  Laterality: Left;    Family History  Problem Relation Age of Onset  . Breast cancer Sister     dx in her 72s  . Heart disease Sister   . Heart disease Father   . Heart disease Mother   . Stroke Mother   . Prostate cancer Maternal Uncle   . Stroke Maternal Grandmother   . Testicular cancer Maternal Grandfather   . Stroke Maternal Grandfather   . Stroke Paternal Grandmother   . Heart disease Paternal Grandfather   . Breast cancer Sister 34    BRCA2+  . Breast cancer Sister 88  . Prostate cancer Maternal Uncle   . Breast cancer Other  maternal great aunt dx later in life    Allergies  Allergen Reactions  . Ibuprofen Nausea Only  . Morphine Nausea And Vomiting    Current Outpatient Prescriptions on File Prior to Visit  Medication Sig Dispense Refill  . amLODipine (NORVASC) 2.5 MG tablet TAKE 1 TABLET DAILY.  90 tablet  1  . aspirin EC 81 MG tablet Take 81 mg by mouth at bedtime.       . Calcium Carbonate-Vitamin D (CALCIUM + D PO) Take 600 mg by mouth daily after lunch.       . carvedilol (COREG) 25 MG tablet TAKE 1 TABLET 2 TIMES A DAY WITH FOOD.  180 tablet  1  . Cholecalciferol (VITAMIN D) 2000 UNITS tablet Take 2,000 Units by mouth daily.      . Coenzyme Q10 (CO Q 10) 100 MG CAPS Take 1 capsule by mouth daily.      Marland Kitchen denosumab (PROLIA) 60 MG/ML SOLN injection Inject 60 mg into the skin every 6 (six) months. Administer in upper arm, thigh, or abdomen      . ferrous gluconate (FERGON) 324 MG tablet Take 324 mg by mouth once a week. Takes 2 tabs on empty stomach with OJ on Monday.      Marland Kitchen  HYDROcodone-acetaminophen (NORCO) 5-325 MG per tablet Take 1 tablet by mouth every 4 (four) hours as needed for moderate pain.  30 tablet  0  . lisinopril (PRINIVIL,ZESTRIL) 20 MG tablet TAKE 1 TABLET AT BEDTIME.  90 tablet  1  . loperamide (IMODIUM) 2 MG capsule Take 1 capsule (2 mg total) by mouth as needed for diarrhea or loose stools.  20 capsule  0  . simvastatin (ZOCOR) 40 MG tablet TAKE 1 TABLET AT BEDTIME.  90 tablet  2  . traMADol (ULTRAM) 50 MG tablet Take 1 tablet (50 mg total) by mouth every 6 (six) hours as needed.  60 tablet  5   No current facility-administered medications on file prior to visit.    BP 112/60  Pulse 71  Temp(Src) 97.9 F (36.6 C) (Oral)  Resp 18  Ht 5' 1"  (1.549 m)  Wt 107 lb (48.535 kg)  BMI 20.23 kg/m2  SpO2 97%    Review of Systems  Constitutional: Negative.   HENT: Negative for congestion, dental problem, hearing loss, rhinorrhea, sinus pressure, sore throat and tinnitus.   Eyes: Negative for pain, discharge and visual disturbance.  Respiratory: Negative for cough and shortness of breath.   Cardiovascular: Negative for chest pain, palpitations and leg swelling.  Gastrointestinal: Negative for nausea, vomiting, abdominal pain, diarrhea, constipation, blood in stool and abdominal distention.  Genitourinary: Negative for dysuria, urgency, frequency, hematuria, flank pain, vaginal bleeding, vaginal discharge, difficulty urinating, vaginal pain and pelvic pain.  Musculoskeletal: Negative for arthralgias, gait problem and joint swelling.  Skin: Positive for wound. Negative for rash.  Neurological: Negative for dizziness, syncope, speech difficulty, weakness, numbness and headaches.  Hematological: Negative for adenopathy.  Psychiatric/Behavioral: Negative for behavioral problems, dysphoric mood and agitation. The patient is not nervous/anxious.        Objective:   Physical Exam  Constitutional: She is oriented to person, place, and time. She  appears well-developed and well-nourished.  Blood pressure low normal at  [facility] Readings from Last 3 Encounters: 07/09/14 : 107 lb (48.535 kg) 05/28/14 : 104 lb 8 oz (47.401 kg) 05/09/14 : 101 lb 8 oz (46.04 kg)  HENT:  Head: Normocephalic.  Right Ear: External ear normal.  Left Ear: External  ear normal.  Mouth/Throat: Oropharynx is clear and moist.  Eyes: Conjunctivae and EOM are normal. Pupils are equal, round, and reactive to light.  Neck: Normal range of motion. Neck supple. No thyromegaly present.  Cardiovascular: Normal rate, regular rhythm, normal heart sounds and intact distal pulses.   Pulmonary/Chest: Effort normal and breath sounds normal.  Abdominal: Soft. Bowel sounds are normal. She exhibits no mass. There is no tenderness.  Musculoskeletal: Normal range of motion.  Lymphadenopathy:    She has no cervical adenopathy.  Neurological: She is alert and oriented to person, place, and time.  Skin: Skin is warm and dry. No rash noted.  Multiple subcutaneous nodules, left arm The largest is an open 2 cm lesion with some minimal drainage Chronic left arm lymphedema, which is mild  Psychiatric: She has a normal mood and affect. Her behavior is normal.          Assessment & Plan:   Coronary artery disease, stable  Hypertension well controlled  History of cardiomyopathy, stable  Osteoporosis.  Continue prolia  Followup oncology  Recheck in 6 months or as needed

## 2014-07-25 ENCOUNTER — Ambulatory Visit (HOSPITAL_BASED_OUTPATIENT_CLINIC_OR_DEPARTMENT_OTHER): Payer: Medicare HMO

## 2014-07-25 VITALS — BP 147/60 | HR 70 | Temp 98.0°F

## 2014-07-25 DIAGNOSIS — Z95828 Presence of other vascular implants and grafts: Secondary | ICD-10-CM

## 2014-07-25 DIAGNOSIS — Z452 Encounter for adjustment and management of vascular access device: Secondary | ICD-10-CM

## 2014-07-25 DIAGNOSIS — C44621 Squamous cell carcinoma of skin of unspecified upper limb, including shoulder: Secondary | ICD-10-CM

## 2014-07-25 MED ORDER — SODIUM CHLORIDE 0.9 % IJ SOLN
10.0000 mL | INTRAMUSCULAR | Status: DC | PRN
  Administered 2014-07-25: 10 mL via INTRAVENOUS
  Filled 2014-07-25: qty 10

## 2014-07-25 MED ORDER — HEPARIN SOD (PORK) LOCK FLUSH 100 UNIT/ML IV SOLN
500.0000 [IU] | Freq: Once | INTRAVENOUS | Status: AC
  Administered 2014-07-25: 500 [IU] via INTRAVENOUS
  Filled 2014-07-25: qty 5

## 2014-08-13 ENCOUNTER — Other Ambulatory Visit: Payer: Self-pay | Admitting: Internal Medicine

## 2014-08-16 ENCOUNTER — Telehealth: Payer: Self-pay | Admitting: Cardiology

## 2014-08-16 ENCOUNTER — Ambulatory Visit (INDEPENDENT_AMBULATORY_CARE_PROVIDER_SITE_OTHER): Payer: Medicare HMO | Admitting: Internal Medicine

## 2014-08-16 ENCOUNTER — Encounter: Payer: Self-pay | Admitting: Internal Medicine

## 2014-08-16 VITALS — BP 110/56 | HR 80 | Temp 98.1°F | Wt 107.0 lb

## 2014-08-16 DIAGNOSIS — Z79899 Other long term (current) drug therapy: Secondary | ICD-10-CM

## 2014-08-16 DIAGNOSIS — I2510 Atherosclerotic heart disease of native coronary artery without angina pectoris: Secondary | ICD-10-CM

## 2014-08-16 DIAGNOSIS — I447 Left bundle-branch block, unspecified: Secondary | ICD-10-CM

## 2014-08-16 DIAGNOSIS — I420 Dilated cardiomyopathy: Secondary | ICD-10-CM

## 2014-08-16 DIAGNOSIS — R55 Syncope and collapse: Secondary | ICD-10-CM

## 2014-08-16 DIAGNOSIS — I10 Essential (primary) hypertension: Secondary | ICD-10-CM

## 2014-08-16 LAB — CBC WITH DIFFERENTIAL/PLATELET
BASOS ABS: 0 10*3/uL (ref 0.0–0.1)
Basophils Relative: 0.6 % (ref 0.0–3.0)
EOS ABS: 0.2 10*3/uL (ref 0.0–0.7)
EOS PCT: 3.8 % (ref 0.0–5.0)
HCT: 33.4 % — ABNORMAL LOW (ref 36.0–46.0)
Hemoglobin: 11.1 g/dL — ABNORMAL LOW (ref 12.0–15.0)
Lymphocytes Relative: 27.9 % (ref 12.0–46.0)
Lymphs Abs: 1.2 10*3/uL (ref 0.7–4.0)
MCHC: 33.2 g/dL (ref 30.0–36.0)
MCV: 96.6 fl (ref 78.0–100.0)
MONO ABS: 0.5 10*3/uL (ref 0.1–1.0)
Monocytes Relative: 10.7 % (ref 3.0–12.0)
Neutro Abs: 2.4 10*3/uL (ref 1.4–7.7)
Neutrophils Relative %: 57 % (ref 43.0–77.0)
PLATELETS: 158 10*3/uL (ref 150.0–400.0)
RBC: 3.46 Mil/uL — ABNORMAL LOW (ref 3.87–5.11)
RDW: 13.6 % (ref 11.5–15.5)
WBC: 4.2 10*3/uL (ref 4.0–10.5)

## 2014-08-16 LAB — COMPREHENSIVE METABOLIC PANEL
ALK PHOS: 44 U/L (ref 39–117)
ALT: 14 U/L (ref 0–35)
AST: 22 U/L (ref 0–37)
Albumin: 4.1 g/dL (ref 3.5–5.2)
BUN: 15 mg/dL (ref 6–23)
CO2: 35 mEq/L — ABNORMAL HIGH (ref 19–32)
Calcium: 9.3 mg/dL (ref 8.4–10.5)
Chloride: 101 mEq/L (ref 96–112)
Creatinine, Ser: 1 mg/dL (ref 0.4–1.2)
GFR: 57.9 mL/min — ABNORMAL LOW (ref >60.00)
Glucose, Bld: 95 mg/dL (ref 70–99)
Potassium: 4.2 mEq/L (ref 3.5–5.1)
SODIUM: 137 meq/L (ref 135–145)
TOTAL PROTEIN: 7 g/dL (ref 6.0–8.3)
Total Bilirubin: 1.1 mg/dL (ref 0.2–1.2)

## 2014-08-16 NOTE — Patient Instructions (Signed)
Cardiology followup as scheduled Avoid driving Discontinue amlodipine

## 2014-08-16 NOTE — Telephone Encounter (Signed)
Close encounter 
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
## 2014-08-17 ENCOUNTER — Ambulatory Visit (INDEPENDENT_AMBULATORY_CARE_PROVIDER_SITE_OTHER): Payer: Medicare HMO | Admitting: Internal Medicine

## 2014-08-17 ENCOUNTER — Encounter: Payer: Self-pay | Admitting: Internal Medicine

## 2014-08-17 VITALS — BP 141/67 | HR 79 | Ht 62.0 in | Wt 107.5 lb

## 2014-08-17 DIAGNOSIS — I447 Left bundle-branch block, unspecified: Secondary | ICD-10-CM

## 2014-08-17 DIAGNOSIS — I10 Essential (primary) hypertension: Secondary | ICD-10-CM

## 2014-08-17 DIAGNOSIS — R55 Syncope and collapse: Secondary | ICD-10-CM

## 2014-08-17 DIAGNOSIS — I420 Dilated cardiomyopathy: Secondary | ICD-10-CM

## 2014-08-17 DIAGNOSIS — E785 Hyperlipidemia, unspecified: Secondary | ICD-10-CM

## 2014-08-17 LAB — TSH: TSH: 1.24 u[IU]/mL (ref 0.35–4.50)
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
## 2014-08-17 NOTE — Patient Instructions (Addendum)
Your physician has recommended that you wear an event monitor. Event monitors are medical devices that record the heart's electrical activity. Doctors most often [place] these monitors to diagnose arrhythmias. Arrhythmias are problems with the speed or rhythm of the heartbeat. The monitor is a small, portable device. You can wear one while you do your normal daily activities. This is usually used to diagnose what is causing palpitations/syncope (passing out). ** you will wear this for 30 days ** make sure monitor and sensor are within 100 ft of each other at all times ** please contact the company when you open the 2nd pack of stickers and batteries so that they will send you more ** please contact the company if your skin gets sensitive to the stickers ** please place all the monitor supplies in silver box and then in [place] when finished with 30 days - call UPS to pick it up or take it to [facility] has requested that you have an echocardiogram. Echocardiography is a painless test that uses sound waves to create images of your heart. It provides your doctor with information about the size and shape of your heart and how well your heart's chambers and valves are working. This procedure takes approximately one hour. There are no restrictions for this procedure.    Your physician recommends that you schedule a follow-up appointment with Dr. Stanford Breed after your monitor and echo.

## 2014-08-20 ENCOUNTER — Ambulatory Visit (HOSPITAL_COMMUNITY)
Admission: RE | Admit: 2014-08-20 | Discharge: 2014-08-20 | Disposition: A | Discharge status: Home / Self Care | Payer: Medicare HMO | Source: Ambulatory Visit | Attending: Cardiology | Admitting: Cardiology

## 2014-08-20 DIAGNOSIS — I519 Heart disease, unspecified: Secondary | ICD-10-CM

## 2014-08-20 DIAGNOSIS — I420 Dilated cardiomyopathy: Secondary | ICD-10-CM

## 2014-08-20 DIAGNOSIS — I10 Essential (primary) hypertension: Secondary | ICD-10-CM | POA: Insufficient documentation

## 2014-08-20 DIAGNOSIS — E785 Hyperlipidemia, unspecified: Secondary | ICD-10-CM | POA: Insufficient documentation

## 2014-08-20 DIAGNOSIS — I428 Other cardiomyopathies: Secondary | ICD-10-CM | POA: Insufficient documentation

## 2014-08-20 DIAGNOSIS — I2510 Atherosclerotic heart disease of native coronary artery without angina pectoris: Secondary | ICD-10-CM

## 2014-08-20 DIAGNOSIS — Z8249 Family history of ischemic heart disease and other diseases of the circulatory system: Secondary | ICD-10-CM | POA: Diagnosis not present

## 2014-08-20 NOTE — Progress Notes (Signed)
2D Echocardiogram Complete.  08/20/2014   Judy Herrera [place], [place]

## 2014-09-02 ENCOUNTER — Other Ambulatory Visit: Payer: Self-pay | Admitting: Oncology

## 2014-09-02 NOTE — Progress Notes (Signed)
Medical Oncology Labs from 08-16-14 noted. She does not need labs repeated on 09-03-14, does need PAC flush. Godfrey Pick, MD

## 2014-09-03 ENCOUNTER — Other Ambulatory Visit (HOSPITAL_BASED_OUTPATIENT_CLINIC_OR_DEPARTMENT_OTHER): Payer: Medicare HMO

## 2014-09-03 ENCOUNTER — Telehealth: Payer: Self-pay | Admitting: Oncology

## 2014-09-03 ENCOUNTER — Encounter: Payer: Self-pay | Admitting: Oncology

## 2014-09-03 ENCOUNTER — Ambulatory Visit (HOSPITAL_BASED_OUTPATIENT_CLINIC_OR_DEPARTMENT_OTHER): Payer: Medicare HMO | Admitting: Oncology

## 2014-09-03 ENCOUNTER — Ambulatory Visit: Payer: Medicare HMO

## 2014-09-03 VITALS — BP 140/66 | HR 71 | Temp 97.9°F | Resp 18 | Ht 62.0 in | Wt 106.6 lb

## 2014-09-03 DIAGNOSIS — Z95828 Presence of other vascular implants and grafts: Secondary | ICD-10-CM

## 2014-09-03 DIAGNOSIS — C792 Secondary malignant neoplasm of skin: Secondary | ICD-10-CM

## 2014-09-03 DIAGNOSIS — J040 Acute laryngitis: Secondary | ICD-10-CM

## 2014-09-03 DIAGNOSIS — I890 Lymphedema, not elsewhere classified: Secondary | ICD-10-CM

## 2014-09-03 DIAGNOSIS — Z853 Personal history of malignant neoplasm of breast: Secondary | ICD-10-CM

## 2014-09-03 DIAGNOSIS — C569 Malignant neoplasm of unspecified ovary: Secondary | ICD-10-CM

## 2014-09-03 MED ORDER — HEPARIN SOD (PORK) LOCK FLUSH 100 UNIT/ML IV SOLN
500.0000 [IU] | Freq: Once | INTRAVENOUS | Status: AC
  Administered 2014-09-03: 500 [IU] via INTRAVENOUS
  Filled 2014-09-03: qty 5

## 2014-09-03 MED ORDER — SODIUM CHLORIDE 0.9 % IJ SOLN
10.0000 mL | INTRAMUSCULAR | Status: DC | PRN
  Administered 2014-09-03: 10 mL via INTRAVENOUS
  Filled 2014-09-03: qty 10

## 2014-09-03 NOTE — Patient Instructions (Signed)
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
## 2014-09-03 NOTE — Telephone Encounter (Signed)
Gave AVS and appt d/t. Sent TM mess for son genetics

## 2014-09-04 DIAGNOSIS — C569 Malignant neoplasm of unspecified ovary: Secondary | ICD-10-CM | POA: Insufficient documentation
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
## 2014-09-18 ENCOUNTER — Encounter: Payer: Self-pay | Admitting: Cardiology

## 2014-09-19 ENCOUNTER — Encounter: Payer: Self-pay | Admitting: Cardiology

## 2014-09-20 ENCOUNTER — Encounter: Payer: Self-pay | Admitting: Cardiology

## 2014-09-20 ENCOUNTER — Ambulatory Visit (INDEPENDENT_AMBULATORY_CARE_PROVIDER_SITE_OTHER): Payer: Medicare HMO | Admitting: Cardiology

## 2014-09-20 VITALS — BP 133/68 | HR 84 | Ht 62.0 in | Wt 107.0 lb

## 2014-09-20 DIAGNOSIS — I420 Dilated cardiomyopathy: Secondary | ICD-10-CM

## 2014-09-20 DIAGNOSIS — I10 Essential (primary) hypertension: Secondary | ICD-10-CM

## 2014-09-20 DIAGNOSIS — R55 Syncope and collapse: Secondary | ICD-10-CM

## 2014-09-20 DIAGNOSIS — I2510 Atherosclerotic heart disease of native coronary artery without angina pectoris: Secondary | ICD-10-CM

## 2014-09-20 DIAGNOSIS — E785 Hyperlipidemia, unspecified: Secondary | ICD-10-CM

## 2014-09-20 NOTE — Assessment & Plan Note (Signed)
Blood pressure controlled. Continue present medications. 

## 2014-09-20 NOTE — Assessment & Plan Note (Signed)
LV function normal. Continue ACE inhibitor and beta blocker.

## 2014-09-20 NOTE — Assessment & Plan Note (Signed)
Etiology unclear. Presentation concerning for arrhythmia. LV function normal. Monitor unremarkable. If she has recurrent symptoms would recommend implantable loop monitor. Patient instructed not to drive for 6 months.

## 2014-09-20 NOTE — Assessment & Plan Note (Signed)
Continue aspirin and statin. 

## 2014-09-20 NOTE — Assessment & Plan Note (Signed)
Continue statin. 

## 2014-09-20 NOTE — Patient Instructions (Signed)
Your physician recommends that you schedule a follow-up appointment in: 3 MONTHS WITH DR CRENSHAW  

## 2014-09-25 ENCOUNTER — Other Ambulatory Visit: Payer: Self-pay | Admitting: Internal Medicine

## 2014-10-02 ENCOUNTER — Ambulatory Visit (INDEPENDENT_AMBULATORY_CARE_PROVIDER_SITE_OTHER): Payer: Medicare HMO | Admitting: Internal Medicine

## 2014-10-02 ENCOUNTER — Encounter: Payer: Self-pay | Admitting: Internal Medicine

## 2014-10-02 VITALS — BP 120/70 | HR 70 | Temp 97.9°F | Resp 18 | Ht 62.0 in | Wt 107.0 lb

## 2014-10-02 DIAGNOSIS — R490 Dysphonia: Secondary | ICD-10-CM

## 2014-10-02 MED ORDER — FLUTICASONE PROPIONATE 50 MCG/ACT NA SUSP: 2.0000 | Freq: Every day | NASAL | Status: DC

## 2014-10-02 NOTE — Progress Notes (Signed)
Subjective:    Patient ID: Judy Herrera, female    DOB: 1931-10-09, 80 y.o.   MRN: [id number]  HPI 80 year old patient.  Lifelong nonsmoker with a complex oncological history who presents with a one-month history of hoarseness.  She has minimal rhinorrhea and no cough or hemoptysis.  She generally feels well.  Denies any reflux symptoms.  Wt Readings from Last 3 Encounters:  10/02/14 107 lb (48.535 kg)  09/20/14 107 lb (48.535 kg)  09/03/14 106 lb 9.6 oz (48.353 kg)    Past Medical History  Diagnosis Date  . Hypertension   . Allergy   . Cancer   . Ovarian ca   . Cataract   . Breast CA   . CAD (coronary artery disease)   . Hyperlipidemia   . Cardiomyopathy     improved  . Arthritis   . Osteoporosis   . History of radiation therapy 1994    left chest wall  . History of chemotherapy   . Eccrine porocarcinoma of skin   . Wears glasses   . Wears dentures     full bottom-partial top    History   Social History  . Marital Status: Widowed    Spouse Name: N/A    Number of Children: 1  . Years of Education: N/A   Occupational History  . Not on file.   Social History Main Topics  . Smoking status: Never Smoker   . Smokeless tobacco: Never Used  . Alcohol Use: No  . Drug Use: No  . Sexual Activity: Not on file     Comment: didn't ask   Other Topics Concern  . Not on file   Social History Narrative    Past Surgical History  Procedure Laterality Date  . Left breast mastectomy  1994    T3 N1 Mx  . Shoulder arthroscopy    . Right leg  1990    fx  . Shoulder surgery  1990    replacement-lt  . Modified radical mastectomy w/ axillary lymph node dissection w/ pectoralis minor excision  1994    Left  . Eye surgery      CATARACTS  . Abdominal hysterectomy  2004  . Cholecystectomy  1993  . Breast surgery    . Skin biopsy  04/27/2011    left hand, forearm and posterior arm  . Mass excision Left 05/09/2014    Procedure: EXCISION OF NODULE LEFT CHEST ;   Surgeon: Pedro Earls, MD;  Location: [facility];  Service: General;  Laterality: Left;    Family History  Problem Relation Age of Onset  . Breast cancer Sister     dx in her 3s  . Heart disease Sister   . Heart disease Father   . Heart disease Mother   . Stroke Mother   . Prostate cancer Maternal Uncle   . Stroke Maternal Grandmother   . Testicular cancer Maternal Grandfather   . Stroke Maternal Grandfather   . Stroke Paternal Grandmother   . Heart disease Paternal Grandfather   . Breast cancer Sister 60    BRCA2+  . Breast cancer Sister 45  . Prostate cancer Maternal Uncle   . Breast cancer Other     maternal great aunt dx later in life    Allergies  Allergen Reactions  . Ibuprofen Nausea Only  . Morphine Nausea And Vomiting    Current Outpatient Prescriptions on File Prior to Visit  Medication Sig Dispense Refill  .  amLODipine (NORVASC) 2.5 MG tablet Take 1 tablet by mouth daily.  1  . aspirin EC 81 MG tablet Take 81 mg by mouth at bedtime.     . Calcium Carbonate-Vitamin D (CALCIUM + D PO) Take 600 mg by mouth daily after lunch.     . carvedilol (COREG) 25 MG tablet TAKE 1 TABLET 2 TIMES A DAY WITH FOOD. 180 tablet 1  . Cholecalciferol (VITAMIN D) 2000 UNITS tablet Take 2,000 Units by mouth daily.    . Coenzyme Q10 (CO Q 10) 100 MG CAPS Take 1 capsule by mouth daily.    Marland Kitchen denosumab (PROLIA) 60 MG/ML SOLN injection Inject 60 mg into the skin every 6 (six) months. Administer in upper arm, thigh, or abdomen    . ferrous gluconate (FERGON) 324 MG tablet Take 324 mg by mouth once a week. Takes 2 tabs on empty stomach with OJ on Monday.    Marland Kitchen lisinopril (PRINIVIL,ZESTRIL) 20 MG tablet TAKE 1 TABLET AT BEDTIME. 90 tablet 1  . loperamide (IMODIUM) 2 MG capsule Take 1 capsule (2 mg total) by mouth as needed for diarrhea or loose stools. 20 capsule 0  . simvastatin (ZOCOR) 40 MG tablet TAKE 1 TABLET AT BEDTIME. 90 tablet 2  . traMADol (ULTRAM) 50 MG tablet  TAKE 1 TABLET EVERY 6 HOURS AS NEEDED FOR PAIN 60 tablet 2   No current facility-administered medications on file prior to visit.    BP 120/70 mmHg  Pulse 70  Temp(Src) 97.9 F (36.6 C) (Oral)  Resp 18  Ht _0  (1.575 m)  Wt 107 lb (48.535 kg)  BMI 19.57 kg/m2  SpO2 95%      Review of Systems  Constitutional: Negative.   HENT: Positive for postnasal drip and voice change. Negative for congestion, dental problem, hearing loss, rhinorrhea, sinus pressure, sore throat and tinnitus.   Eyes: Negative for pain, discharge and visual disturbance.  Respiratory: Negative for cough and shortness of breath.   Cardiovascular: Negative for chest pain, palpitations and leg swelling.  Gastrointestinal: Negative for nausea, vomiting, abdominal pain, diarrhea, constipation, blood in stool and abdominal distention.  Genitourinary: Negative for dysuria, urgency, frequency, hematuria, flank pain, vaginal bleeding, vaginal discharge, difficulty urinating, vaginal pain and pelvic pain.  Musculoskeletal: Negative for joint swelling, arthralgias and gait problem.  Skin: Negative for rash.  Neurological: Negative for dizziness, syncope, speech difficulty, weakness, numbness and headaches.  Hematological: Negative for adenopathy.  Psychiatric/Behavioral: Negative for behavioral problems, dysphoric mood and agitation. The patient is not nervous/anxious.        Objective:   Physical Exam  Constitutional: She is oriented to person, place, and time. She appears well-developed and well-nourished.  HENT:  Head: Normocephalic.  Right Ear: External ear normal.  Left Ear: External ear normal.  Mouth/Throat: Oropharynx is clear and moist.  Low hanging soft palate, but no abnormalities appreciated  Eyes: Conjunctivae and EOM are normal. Pupils are equal, round, and reactive to light.  Neck: Normal range of motion. Neck supple. No thyromegaly present.  Cardiovascular: Normal rate, regular rhythm, normal  heart sounds and intact distal pulses.   Pulmonary/Chest: Effort normal and breath sounds normal.  Abdominal: Soft. Bowel sounds are normal. She exhibits no mass. There is no tenderness.  Musculoskeletal: Normal range of motion.  Lymphadenopathy:    She has no cervical adenopathy.  Neurological: She is alert and oriented to person, place, and time.  Skin: Skin is warm and dry. No rash noted.  Psychiatric: She has a normal  mood and affect. Her behavior is normal.          Assessment & Plan:   Chronic hoarseness.  Will set up for ENT evaluation History allergic rhinitis.  Will treat with a daily nonsedating antihistamine as well as fluticasone nasal spray

## 2014-10-02 NOTE — Progress Notes (Signed)
Pre visit review using our clinic review tool, if applicable. No additional management support is needed unless otherwise documented below in the visit note. 

## 2014-10-02 NOTE — Patient Instructions (Addendum)
Hoarseness Hoarseness is produced from a variety of causes. It is important to find the cause so it can be treated. In the absence of a cold or upper respiratory illness, any hoarseness lasting more than 2 weeks should be looked at by a specialist. This is especially important if you have a history of smoking or alcohol use. It is also important to keep in mind that as you grow older, your voice will naturally get weaker, making it easier for you to become hoarse from straining your vocal cords.  CAUSES  Any illness that affects your vocal cords can result in a hoarse voice. Examples of conditions that can affect the vocal cords are listed as follows:   Allergies.  Colds.  Sinusitis.  Gastroesophageal reflux disease.  Croup.  Injury.  Nodules.  Exposure to smoke or toxic fumes or gases.  Congenital and genetic defects.  Paralysis of the vocal cords.  Infections.  Advanced age. DIAGNOSIS  In order to diagnose the cause of your hoarseness, your caregiver will examine your throat using an instrument that uses a tube with a small lighted camera (laryngoscope). It allows your caregiver to look into the mouth and down the throat. TREATMENT  For most cases, treatment will focus on the specific cause of the hoarseness. Depending on the cause, hoarseness can be a temporary condition (acute) or it can be long lasting (chronic). Most cases of hoarseness clear up without complications. Your caregiver will explain to you if this is not likely to happen. SEEK IMMEDIATE MEDICAL CARE IF:   You have increasing hoarseness or loss of voice.  You have shortness of breath.  You are coughing up blood.  There is pain in your neck or throat. Document Released: 10/16/2005 Document Revised: 01/25/2012 Document Reviewed: 01/08/2011 [facility] Patient Information 2015 [place], [place]. This information is not intended to replace advice given to you by your health care provider. Make sure you discuss any  questions you have with your health care provider.   ENT evaluation as discussed

## 2014-10-15 ENCOUNTER — Other Ambulatory Visit: Payer: Self-pay | Admitting: Otolaryngology

## 2014-10-15 DIAGNOSIS — R490 Dysphonia: Secondary | ICD-10-CM

## 2014-10-15 DIAGNOSIS — J3800 Paralysis of vocal cords and larynx, unspecified: Secondary | ICD-10-CM

## 2014-10-19 ENCOUNTER — Ambulatory Visit
Admission: RE | Admit: 2014-10-19 | Discharge: 2014-10-19 | Disposition: A | Discharge status: Home / Self Care | Payer: Medicare HMO | Source: Ambulatory Visit | Attending: Otolaryngology | Admitting: Otolaryngology

## 2014-10-19 DIAGNOSIS — J3800 Paralysis of vocal cords and larynx, unspecified: Secondary | ICD-10-CM

## 2014-10-19 DIAGNOSIS — R490 Dysphonia: Secondary | ICD-10-CM

## 2014-10-19 MED ORDER — IOHEXOL 300 MG/ML  SOLN
75.0000 mL | Freq: Once | INTRAMUSCULAR | Status: AC | PRN
  Administered 2014-10-19: 75 mL via INTRAVENOUS

## 2014-10-29 ENCOUNTER — Ambulatory Visit (HOSPITAL_BASED_OUTPATIENT_CLINIC_OR_DEPARTMENT_OTHER): Payer: Medicare HMO

## 2014-10-29 VITALS — BP 157/72 | HR 78 | Temp 98.0°F

## 2014-10-29 DIAGNOSIS — C569 Malignant neoplasm of unspecified ovary: Secondary | ICD-10-CM

## 2014-10-29 DIAGNOSIS — Z452 Encounter for adjustment and management of vascular access device: Secondary | ICD-10-CM

## 2014-10-29 DIAGNOSIS — Z95828 Presence of other vascular implants and grafts: Secondary | ICD-10-CM

## 2014-10-29 MED ORDER — HEPARIN SOD (PORK) LOCK FLUSH 100 UNIT/ML IV SOLN
500.0000 [IU] | Freq: Once | INTRAVENOUS | Status: AC
  Administered 2014-10-29: 500 [IU] via INTRAVENOUS
  Filled 2014-10-29: qty 5

## 2014-10-29 MED ORDER — SODIUM CHLORIDE 0.9 % IJ SOLN
10.0000 mL | INTRAMUSCULAR | Status: DC | PRN
  Administered 2014-10-29: 10 mL via INTRAVENOUS
  Filled 2014-10-29: qty 10

## 2014-10-29 NOTE — Patient Instructions (Signed)

## 2014-11-19 ENCOUNTER — Other Ambulatory Visit: Payer: Self-pay | Admitting: Internal Medicine

## 2014-12-05 ENCOUNTER — Other Ambulatory Visit: Payer: Self-pay | Admitting: Internal Medicine

## 2014-12-21 ENCOUNTER — Other Ambulatory Visit: Payer: Self-pay | Admitting: *Deleted

## 2014-12-21 DIAGNOSIS — C569 Malignant neoplasm of unspecified ovary: Secondary | ICD-10-CM

## 2014-12-23 ENCOUNTER — Other Ambulatory Visit: Payer: Self-pay | Admitting: Oncology

## 2014-12-24 ENCOUNTER — Ambulatory Visit (HOSPITAL_BASED_OUTPATIENT_CLINIC_OR_DEPARTMENT_OTHER): Payer: Medicare HMO | Admitting: Oncology

## 2014-12-24 ENCOUNTER — Ambulatory Visit (HOSPITAL_BASED_OUTPATIENT_CLINIC_OR_DEPARTMENT_OTHER): Payer: Medicare HMO

## 2014-12-24 ENCOUNTER — Other Ambulatory Visit (HOSPITAL_BASED_OUTPATIENT_CLINIC_OR_DEPARTMENT_OTHER): Payer: Medicare HMO

## 2014-12-24 ENCOUNTER — Encounter: Payer: Self-pay | Admitting: Oncology

## 2014-12-24 ENCOUNTER — Other Ambulatory Visit: Payer: Self-pay | Admitting: *Deleted

## 2014-12-24 VITALS — BP 151/61 | HR 74 | Temp 98.1°F | Resp 18 | Ht 62.0 in | Wt 105.3 lb

## 2014-12-24 DIAGNOSIS — C4499 Other specified malignant neoplasm of skin, unspecified: Secondary | ICD-10-CM

## 2014-12-24 DIAGNOSIS — Z95828 Presence of other vascular implants and grafts: Secondary | ICD-10-CM

## 2014-12-24 DIAGNOSIS — Z9889 Other specified postprocedural states: Secondary | ICD-10-CM

## 2014-12-24 DIAGNOSIS — Z853 Personal history of malignant neoplasm of breast: Secondary | ICD-10-CM

## 2014-12-24 DIAGNOSIS — C569 Malignant neoplasm of unspecified ovary: Secondary | ICD-10-CM

## 2014-12-24 DIAGNOSIS — Z803 Family history of malignant neoplasm of breast: Secondary | ICD-10-CM

## 2014-12-24 DIAGNOSIS — J3801 Paralysis of vocal cords and larynx, unilateral: Secondary | ICD-10-CM

## 2014-12-24 DIAGNOSIS — C44691 Other specified malignant neoplasm of skin of unspecified upper limb, including shoulder: Secondary | ICD-10-CM

## 2014-12-24 LAB — CBC WITH DIFFERENTIAL/PLATELET
BASO%: 1.3 % (ref 0.0–2.0)
BASOS ABS: 0 10*3/uL (ref 0.0–0.1)
EOS%: 3.2 % (ref 0.0–7.0)
Eosinophils Absolute: 0.1 10*3/uL (ref 0.0–0.5)
HCT: 33.2 % — ABNORMAL LOW (ref 34.8–46.6)
HGB: 11.1 g/dL — ABNORMAL LOW (ref 11.6–15.9)
LYMPH#: 0.6 10*3/uL — AB (ref 0.9–3.3)
LYMPH%: 16.2 % (ref 14.0–49.7)
MCH: 31.4 pg (ref 25.1–34.0)
MCHC: 33.4 g/dL (ref 31.5–36.0)
MCV: 94 fL (ref 79.5–101.0)
MONO#: 0.4 10*3/uL (ref 0.1–0.9)
MONO%: 9 % (ref 0.0–14.0)
NEUT#: 2.8 10*3/uL (ref 1.5–6.5)
NEUT%: 70.3 % (ref 38.4–76.8)
Platelets: 146 10*3/uL (ref 145–400)
RBC: 3.53 10*6/uL — AB (ref 3.70–5.45)
RDW: 13.5 % (ref 11.2–14.5)
WBC: 3.9 10*3/uL (ref 3.9–10.3)

## 2014-12-24 LAB — COMPREHENSIVE METABOLIC PANEL (CC13)
ALT: 21 U/L (ref 0–55)
ANION GAP: 9 meq/L (ref 3–11)
AST: 22 U/L (ref 5–34)
Albumin: 3.6 g/dL (ref 3.5–5.0)
Alkaline Phosphatase: 46 U/L (ref 40–150)
BUN: 19 mg/dL (ref 7.0–26.0)
CO2: 26 meq/L (ref 22–29)
CREATININE: 0.9 mg/dL (ref 0.6–1.1)
Calcium: 8.8 mg/dL (ref 8.4–10.4)
Chloride: 104 mEq/L (ref 98–109)
EGFR: 61 mL/min/{1.73_m2} — ABNORMAL LOW (ref >90)
Glucose: 93 mg/dl (ref 70–140)
Potassium: 4.2 mEq/L (ref 3.5–5.1)
Sodium: 140 mEq/L (ref 136–145)
Total Bilirubin: 1.1 mg/dL (ref 0.20–1.20)
Total Protein: 5.9 g/dL — ABNORMAL LOW (ref 6.4–8.3)

## 2014-12-24 MED ORDER — SODIUM CHLORIDE 0.9 % IJ SOLN
10.0000 mL | INTRAMUSCULAR | Status: DC | PRN
  Administered 2014-12-24: 10 mL via INTRAVENOUS
  Filled 2014-12-24: qty 10

## 2014-12-24 MED ORDER — HEPARIN SOD (PORK) LOCK FLUSH 100 UNIT/ML IV SOLN
500.0000 [IU] | Freq: Once | INTRAVENOUS | Status: AC
  Administered 2014-12-24: 500 [IU] via INTRAVENOUS
  Filled 2014-12-24: qty 5

## 2014-12-24 MED ORDER — MUPIROCIN 2 % EX OINT: 1.0000 "application " | TOPICAL_OINTMENT | Freq: Two times a day (BID) | CUTANEOUS | Status: DC

## 2014-12-24 NOTE — Progress Notes (Signed)
OFFICE PROGRESS NOTE   December 24, 2014   Physicians:P.Kwaitkowski, M.Manning, P.Lindaann Pascal, F.Lupton, B.Crenshaw, M.Martin, 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 She was recently seen by Dr Radene Journey for persistent hoarseness, with left vocal cord paralysis identified. She had CT neck on 10-19-14 and tells me that Dr Lucia Gaskins discussed 2 procedures for the vocal cord; she is to see him again in 1-2 months.  Patient denies any throat or chest pain. She denies aspiration symptoms. Voice at times seems stronger than other times. She has intermittent bleeding and sometimes superficial drainage from the largest area of porocarcinoma at left elbow, has to keep a dressing on it for this reason. The other scattered lesions involving LUE are mostly crusted, much smaller, and really not bothersome to her. She has had less swelling in LUE than in past. Overall she is very pleased to generally feel very well.   PAC, flushed today She had negative BRCA testing, tho sister is BRCA 2+. Brother and nephew (?) with prostate cancer. She has had flu vaccine  She is to see Dr Nonie Hoyer in late Feb, and Dr Allyson Sabal also sometime in Feb. I believe Dr Pollie Friar appointment may be in March. She sees Dr Stanford Breed 12-26-14.   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 She had re-excisions of all areas (dorsum left hand, 2 on left forearm and left arm) by Dr Ronnette Hila. She had significant lymphedema LUE after those procedures, with history of left breast cancer with 20 node left axillary dissection in 1994 followed by radiation. Repeat scans at [facility] in 05-2012 had no distant disease. With progressive in transit mets, she had hyperthermic limb perfusion by Dr Clovis Riley at [facility] in  06-2012 and excision of an area on dorsum of left hand in Oct 2013. She had significant swelling of LUE after the limb perfusion, which gradually improved, and progression of the in transit mets LUE after the limb perfusion. She saw Dr Turner Daniels at [facility], with recommendation for gemzar/taxotere as systemic sarcoma regimen, chosen in part due to her prior chemotherapy. She had no evidence of disease on CXR at [facility] 11-23-2012. She had single cycle of dose reduced gemcitabine taxotere Feb 2014, tolerated very poorly, with hospitalization due to pancytopenia despite neulasta, requiring transfusions of PRBCs and platelets, as well as severe diarrhea and related decrease in nutritional status and general debility. PS took several months to improve back to baseline after that attempt at chemotherapy. She has not wanted any further attempt at chemotherapy for the porocarcinoma to this point. She did have good improvement in bothersome tumor nodule on left olecranon process with RT by Dr Tammi Klippel 05-2013, with 40 Gy in 10 fractions. She had additional RT to symptomatic area of the porocarcinoma 4-20 thru 03-19-14.  LEFT BREAST CANCER T3N1 (2 of 20 nodes) ER+ in 1994, treated with mastectomy and axillary node dissection, adriamycin/cytoxan x4, RT, and tamoxifen x 5 years. Last right mammogram was in [facility] 05-12-13, with extremely dense breast tissue (did not have 3D tomo, tho this would be appropriate with the dense tissue).  1A CLEAR CELL OVARIAN CARCINOMA  in 2004, treated with surgery followed by 3 cycles of taxol/ carboplatin. She saw Dr Josephina Shih [phone] and is to see him again in a year. She has  had no known recurrence of the gyn cancer. Last CA 125 05-04-14 was stable at 8.7  Sister is BRCA2 +, however patient is not, even with retesting.   Review of systems as above, also: No fever or symptoms of infection. No other bleeding. No cough, sputum, chest pain. No changes left  mastectomy area or right breast. Appetite generally good. No change bowels. No swelling LE. Is up thru day at home. No problems with PAC. No abdominal or pelvic discomfort Remainder of 10 point Review of Systems negative.  Objective:  Vital signs in last 24 hours:  BP 151/61 mmHg  Pulse 74  Temp(Src) 98.1 F (36.7 C) (Oral)  Resp 18  Ht 5' 2"  (1.575 m)  Wt 105 lb 4.8 oz (47.764 kg)  BMI 19.25 kg/m2  SpO2 99% Weight down 1 lb. Voice soft, high pitched and breathy Alert, oriented and appropriate, very pleasant as always. Ambulatory without difficulty.  No alopecia  HEENT:PERRL, sclerae not icteric. Oral mucosa moist without lesions, posterior pharynx clear.  Neck supple. No JVD.  Lymphatics:no cervical,supraclavicular, axillary or inguinal adenopathy Resp: clear to auscultation bilaterally and normal percussion bilaterally Cardio: regular rate and rhythm. No gallop. GI: soft, nontender, not distended, no mass or organomegaly. Normally active bowel sounds.  Musculoskeletal/ Extremities: 1+ swelling LUE, otherwise without pitting edema, cords, tenderness Neuro: speech fluent and appropriate, moves all extremities equally, no focal deficits. PSYCH appropriate mood and affect Skin without rash, ecchymosis, petechiae. Multiple in transit lesions of porocarcinoma from left hand to upper arm, most 2-5 mm, most hyperkeratotic. Largest area is fungating ~ 3 x 3.5 cm at left elbow, some adherent pale yellow material and whole lesion raw. No surrounding erythema and not tender. Breasts: left mastectomy scar not remarkable, right without dominant mass, skin or nipple findings. Axillae benign. Portacath-without erythema or tenderness  Lab Results:  Results for orders placed or performed in visit on 12/24/14  CBC with Differential  Result Value Ref Range   WBC 3.9 3.9 - 10.3 10e3/uL   NEUT# 2.8 1.5 - 6.5 10e3/uL   HGB 11.1 (L) 11.6 - 15.9 g/dL   HCT 33.2 (L) 34.8 - 46.6 %   Platelets 146 145  - 400 10e3/uL   MCV 94.0 79.5 - 101.0 fL   MCH 31.4 25.1 - 34.0 pg   MCHC 33.4 31.5 - 36.0 g/dL   RBC 3.53 (L) 3.70 - 5.45 10e6/uL   RDW 13.5 11.2 - 14.5 %   lymph# 0.6 (L) 0.9 - 3.3 10e3/uL   MONO# 0.4 0.1 - 0.9 10e3/uL   Eosinophils Absolute 0.1 0.0 - 0.5 10e3/uL   Basophils Absolute 0.0 0.0 - 0.1 10e3/uL   NEUT% 70.3 38.4 - 76.8 %   LYMPH% 16.2 14.0 - 49.7 %   MONO% 9.0 0.0 - 14.0 %   EOS% 3.2 0.0 - 7.0 %   BASO% 1.3 0.0 - 2.0 %  Comprehensive metabolic panel (Cmet) - [facility]  Result Value Ref Range   Sodium 140 136 - 145 mEq/L   Potassium 4.2 3.5 - 5.1 mEq/L   Chloride 104 98 - 109 mEq/L   CO2 26 22 - 29 mEq/L   Glucose 93 70 - 140 mg/dl   BUN 19.0 7.0 - 26.0 mg/dL   Creatinine 0.9 0.6 - 1.1 mg/dL   Total Bilirubin 1.10 0.20 - 1.20 mg/dL   Alkaline Phosphatase 46 40 - 150 U/L   AST 22 5 - 34 U/L   ALT 21 0 - 55 U/L  Total Protein 5.9 (L) 6.4 - 8.3 g/dL   Albumin 3.6 3.5 - 5.0 g/dL   Calcium 8.8 8.4 - 10.4 mg/dL   Anion Gap 9 3 - 11 mEq/L   EGFR 61 (L) >90 ml/min/1.73 m2     Studies/Results: CT NECK WITH CONTRAST 10-19-14 CONTRAST: 36m OMNIPAQUE IOHEXOL 300 MG/ML SOLN  COMPARISON: Chest CT 05/02/2013  FINDINGS: The visualized portion of the brain is unremarkable. Prior bilateral cataract extraction is noted. Visualized paranasal sinuses and mastoid air cells are clear. The nasopharynx is unremarkable. No masses identified in the oral cavity or oropharynx allowing for metallic dental streak artifact.  There are findings of left vocal cord paralysis including medialization of the left vocal cord, enlargement of the left laryngeal ventricle and left piriform sinus, and medial displacement and thickening of the left aryepiglottic fold. No neck mass or enlarged lymph nodes are identified, although the supraclavicular regions are partially obscured by streak artifact.  The thyroid, submandibular, and parotid glands are unremarkable. Major vascular structures  of the neck appear patent. Moderate atherosclerotic disease is noted. Right jugular central venous catheter is partially visualized.  Pleural thickening and paramediastinal confluent opacity in the apical left upper lobe are again seen with associated upper lobe volume loss, unchanged from prior chest CT. Multilevel cervical disc degeneration facet arthrosis is noted in the mid and lower cervical spine, and there is grade 1 anterolisthesis of C4 on C5 and slight retrolisthesis of C5 on C6. Listhesis is likely degenerative.  IMPRESSION: 1. Findings of left vocal cord paralysis. No neck mass identified. 2. Left apical lung changes compatible with prior radiation therapy, stable from prior chest CT. 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  PACs images reviewed by MD. Scan information discussed with patient in regards to the VC paralysis.   Medications: I have reviewed the patient's current medications. Will try topical clindamycin or bactroban ointment to the largest area on LUE, whichever pharmacy can get and whichever price most appropriate.     DISCUSSION: Discussed anatomy of vocal cord innervation and rationale for CT chest, which she agrees to have. Discussed in general procedures that can be done by ENT to move the paralyzed VC to midline, to help voice and decrease risk of aspiration. Discussed always sitting up straight when eating and being aware if certain food or liquid consistencies are better tolerated. Discussed signs of aspiration. She plans to keep appointment with Dr NLucia Gaskinsupcoming. Discussed asking Dr MTammi Klippelto see again, in case some additional radiation possible to the fungating area, especially with more bleeding, tho this is a small amount. Discussed topical antibiotic from now until she sees either Dr KNonie Hoyeror Dr LAllyson Sabal both upcoming appointments, to see if this helps with some of the  drainage.  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 Try topical antibiotic, requested reevaluation by Dr MTammi Klippel Otherwise follow from this office as she did not tolerate/ does not want other systemic treatments. 2.T3N1 left breast cancer treated with surgery, RT and chemo[facility] 3.LUE lymphedema from breast cancer surgery and radiation, porocarcinoma and those treatments including local radiation and limb perfusion previously.  4.IA ovarian cancer 2004. No known recurrent disease. Yearly follow up with Dr CJosephina Shih last 04-2014  5.left vocal cord paralysis: may be from fibrosis  related to previous radiation, but will get CT chest to look at mediastinum. No clinical concern for aspiration that I can tell. 6.cardiomyopathy 2008. Syncopal episode while driving such that she no longer drives. Cardiology following. 7.osteoporosis with vertebral compression fracture previously.  8.PAC in since attempt at chemotherapy in early 2014. Peripheral IV access is extremely poor such that PAC has been maintained.  48. Sister BRCA 2+. Patient does not have that same mutation, and genetics testing thus not done on Mrs.Rodwell's son per genetics counselor. 10. Excisional biopsy of left chest nodule by Dr Hassell Done 04-2013, benign 11.flu vaccine done   Patient understands discussion and is in agreement with recommendations and plans as above Time spent 25 min including >50% counseling and coordination of care.  Shimon Trowbridge P, MD   12/24/2014, 10:44 AM

## 2014-12-24 NOTE — Patient Instructions (Signed)

## 2014-12-25 NOTE — Progress Notes (Signed)
HPI: FU cardiomyopathy out of proportion to coronary artery disease. She did have a cardiac catheterization performed on August 18, 2005. At that time, she had a 60% OM, but otherwise nonobstructive coronary artery disease. Last echocardiogram in October 2015 showed normal LV function and grade 1 diastolic dysfunction. She was seen recently with syncope. Event monitor revealed sinus with occasional PACs and PVCs. Since last seen the patient denies any dyspnea on exertion, orthopnea, PND, pedal edema, palpitations, syncope or chest pain.   Current Outpatient Prescriptions  Medication Sig Dispense Refill  . aspirin EC 81 MG tablet Take 81 mg by mouth at bedtime.     . Calcium Carbonate-Vitamin D (CALCIUM + D PO) Take 600 mg by mouth daily after lunch.     . carvedilol (COREG) 25 MG tablet TAKE 1 TABLET 2 TIMES A DAY WITH FOOD. 180 tablet 1  . Cholecalciferol (VITAMIN D) 2000 UNITS tablet Take 2,000 Units by mouth daily.    . Coenzyme Q10 (CO Q 10) 100 MG CAPS Take 1 capsule by mouth daily.    Marland Kitchen denosumab (PROLIA) 60 MG/ML SOLN injection Inject 60 mg into the skin every 6 (six) months. Administer in upper arm, thigh, or abdomen    . ferrous gluconate (FERGON) 324 MG tablet Take 324 mg by mouth once a week. Takes 2 tabs on empty stomach with OJ on Monday.    . fluticasone (FLONASE) 50 MCG/ACT nasal spray Place 2 sprays into both nostrils daily. 16 g 6  . lisinopril (PRINIVIL,ZESTRIL) 20 MG tablet TAKE 1 TABLET AT BEDTIME. 90 tablet 1  . loperamide (IMODIUM) 2 MG capsule Take 1 capsule (2 mg total) by mouth as needed for diarrhea or loose stools. 20 capsule 0  . mupirocin ointment (BACTROBAN) 2 % Apply 1 application topically 2 (two) times daily. To left elbow 7-10 days 22 g 0  . simvastatin (ZOCOR) 40 MG tablet TAKE 1 TABLET AT BEDTIME. 90 tablet 2  . traMADol (ULTRAM) 50 MG tablet TAKE 1 TABLET EVERY 6 HOURS AS NEEDED FOR PAIN 60 tablet 2   No current facility-administered medications for  this visit.     Past Medical History  Diagnosis Date  . Hypertension   . Allergy   . Cancer   . Ovarian ca   . Cataract   . Breast CA   . CAD (coronary artery disease)   . Hyperlipidemia   . Cardiomyopathy     improved  . Arthritis   . Osteoporosis   . History of radiation therapy 1994    left chest wall  . History of chemotherapy   . Eccrine porocarcinoma of skin   . Wears glasses   . Wears dentures     full bottom-partial top    Past Surgical History  Procedure Laterality Date  . Left breast mastectomy  1994    T3 N1 Mx  . Shoulder arthroscopy    . Right leg  1990    fx  . Shoulder surgery  1990    replacement-lt  . Modified radical mastectomy w/ axillary lymph node dissection w/ pectoralis minor excision  1994    Left  . Eye surgery      CATARACTS  . Abdominal hysterectomy  2004  . Cholecystectomy  1993  . Breast surgery    . Skin biopsy  04/27/2011    left hand, forearm and posterior arm  . Mass excision Left 05/09/2014    Procedure: EXCISION OF NODULE LEFT CHEST ;  Surgeon: Pedro Earls, MD;  Location: [place];  Service: General;  Laterality: Left;    History   Social History  . Marital Status: Widowed    Spouse Name: N/A  . Number of Children: 1  . Years of Education: N/A   Occupational History  . Not on file.   Social History Main Topics  . Smoking status: Never Smoker   . Smokeless tobacco: Never Used  . Alcohol Use: No  . Drug Use: No  . Sexual Activity: Not on file     Comment: didn't ask   Other Topics Concern  . Not on file   Social History Narrative    ROS: no fevers or chills, productive cough, hemoptysis, dysphasia, odynophagia, melena, hematochezia, dysuria, hematuria, rash, seizure activity, orthopnea, PND, pedal edema, claudication. Remaining systems are negative.  Physical Exam: Well-developed frail in no acute distress.  Skin is warm and dry.  HEENT is normal.  Neck is supple.  Chest is clear to  auscultation with normal expansion.  Cardiovascular exam is regular rate and rhythm.  Abdominal exam nontender or distended. No masses palpated. Extremities show no edema. neuro grossly intact  ECG sinus rhythm, left bundle branch block.

## 2014-12-26 ENCOUNTER — Telehealth: Payer: Self-pay | Admitting: Oncology

## 2014-12-26 ENCOUNTER — Ambulatory Visit (INDEPENDENT_AMBULATORY_CARE_PROVIDER_SITE_OTHER): Payer: Medicare HMO | Admitting: Cardiology

## 2014-12-26 ENCOUNTER — Encounter: Payer: Self-pay | Admitting: Cardiology

## 2014-12-26 VITALS — BP 140/70 | HR 69 | Ht 62.0 in | Wt 105.1 lb

## 2014-12-26 DIAGNOSIS — I10 Essential (primary) hypertension: Secondary | ICD-10-CM

## 2014-12-26 DIAGNOSIS — I420 Dilated cardiomyopathy: Secondary | ICD-10-CM

## 2014-12-26 DIAGNOSIS — I2510 Atherosclerotic heart disease of native coronary artery without angina pectoris: Secondary | ICD-10-CM

## 2014-12-26 DIAGNOSIS — E785 Hyperlipidemia, unspecified: Secondary | ICD-10-CM

## 2014-12-26 DIAGNOSIS — R55 Syncope and collapse: Secondary | ICD-10-CM

## 2014-12-26 NOTE — Assessment & Plan Note (Signed)
Patient has had no further syncopal episodes.I have instructed her that she can resume driving once she is 6 months removed from previous event. This would be April 1. If she has recurrent events in the future we will consider an implantable loop monitor. Given baseline left bundle branch block bradycardia arrhythmias remain a concern.

## 2014-12-26 NOTE — Patient Instructions (Signed)
Your physician wants you to follow-up in: 6 MONTHS WITH DR CRENSHAW You will receive a reminder letter in the mail two months in advance. If you don't receive a letter, please call our office to schedule the follow-up appointment.  

## 2014-12-26 NOTE — Telephone Encounter (Signed)
, °

## 2014-12-26 NOTE — Assessment & Plan Note (Signed)
Blood pressure controlled. Continue present medications. 

## 2014-12-26 NOTE — Assessment & Plan Note (Signed)
Continue aspirin and statin. 

## 2014-12-26 NOTE — Assessment & Plan Note (Signed)
Continue statin. 

## 2014-12-26 NOTE — Assessment & Plan Note (Signed)
LV function improved on most recent echo. Continue ACE inhibitor and beta blocker.

## 2014-12-27 ENCOUNTER — Ambulatory Visit: Payer: Medicare HMO | Admitting: Radiation Oncology

## 2014-12-27 LAB — WOUND CULTURE

## 2014-12-28 ENCOUNTER — Telehealth: Payer: Self-pay | Admitting: Oncology

## 2014-12-28 NOTE — Telephone Encounter (Signed)
Medical Oncology  Peer to peer with insurance re chest CT scheduled for 01-01-16, authorized with information given.Authorization #   [id number].  [employer] financial staff notified by MD.  Godfrey Pick, MD

## 2014-12-31 ENCOUNTER — Ambulatory Visit (HOSPITAL_COMMUNITY): Payer: Medicare HMO

## 2015-01-01 ENCOUNTER — Telehealth: Payer: Self-pay | Admitting: *Deleted

## 2015-01-01 NOTE — Telephone Encounter (Signed)
-----   Message from Gordy Levan, MD sent at 01/01/2015  4:19 PM EST ----- Labs seen and need follow up: please let her know we did not culture any infection from the area of cancer on her arm. If topical antibiotic has not made much difference, ok to stop that

## 2015-01-01 NOTE — Telephone Encounter (Signed)
Called patient with results as noted below by Dr. Marko Plume. She states she is still using the topical antibiotic and it is helping the site on her left elbow. Told patient to call us back with any further questions or concerns.

## 2015-01-07 ENCOUNTER — Encounter (HOSPITAL_COMMUNITY): Payer: Self-pay

## 2015-01-07 ENCOUNTER — Ambulatory Visit (HOSPITAL_COMMUNITY)
Admission: RE | Admit: 2015-01-07 | Discharge: 2015-01-07 | Disposition: A | Discharge status: Home / Self Care | Payer: Medicare HMO | Source: Ambulatory Visit | Attending: Oncology | Admitting: Oncology

## 2015-01-07 DIAGNOSIS — Z9012 Acquired absence of left breast and nipple: Secondary | ICD-10-CM | POA: Insufficient documentation

## 2015-01-07 DIAGNOSIS — J3801 Paralysis of vocal cords and larynx, unilateral: Secondary | ICD-10-CM

## 2015-01-07 DIAGNOSIS — Z90710 Acquired absence of both cervix and uterus: Secondary | ICD-10-CM | POA: Diagnosis not present

## 2015-01-07 DIAGNOSIS — Z853 Personal history of malignant neoplasm of breast: Secondary | ICD-10-CM | POA: Insufficient documentation

## 2015-01-07 DIAGNOSIS — R490 Dysphonia: Secondary | ICD-10-CM | POA: Insufficient documentation

## 2015-01-07 DIAGNOSIS — Z9221 Personal history of antineoplastic chemotherapy: Secondary | ICD-10-CM | POA: Insufficient documentation

## 2015-01-07 DIAGNOSIS — R911 Solitary pulmonary nodule: Secondary | ICD-10-CM | POA: Diagnosis not present

## 2015-01-07 MED ORDER — IOHEXOL 300 MG/ML  SOLN
80.0000 mL | Freq: Once | INTRAMUSCULAR | Status: AC | PRN
  Administered 2015-01-07: 80 mL via INTRAVENOUS

## 2015-01-08 ENCOUNTER — Telehealth: Payer: Self-pay

## 2015-01-08 NOTE — Progress Notes (Signed)
Radiation Oncology         [phone] ________________________________  Name: Judy Herrera MRN: [id number]  Date: 01/09/2015  DOB: 02-Sep-1931  Follow-Up Visit Note  CC: Nyoka Cowden, MD  Gordy Levan, MD  Diagnosis:   81 year old woman with porocarcinoma involving the left arm s/p   05/29/2013-06/12/2013  The cutaneous tumor on the left olecranon process received 40 gray in 10 fractions of 4 gray   03/05/2014-03/19/2014 The involved sites on the left arm were treated to 40 Gy in 10 fractions.    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    She has had previous radiation to superficial cutaneous nodules on the left forearm and has developed further growth of the previously treated left olecranon nodule.                          ALLERGIES:  is allergic to ibuprofen and morphine.  Meds: Current Outpatient Prescriptions  Medication Sig Dispense Refill  . aspirin EC 81 MG tablet Take 81 mg by mouth at bedtime.     . Calcium Carbonate-Vitamin D (CALCIUM + D PO) Take 600 mg by mouth daily after lunch.     . carvedilol (COREG) 25 MG tablet TAKE 1 TABLET 2 TIMES A DAY WITH FOOD. 180 tablet 1  . Cholecalciferol (VITAMIN D) 2000 UNITS tablet Take 2,000 Units by mouth daily.    . Coenzyme Q10 (CO Q 10) 100 MG CAPS Take 1 capsule by mouth daily.    Marland Kitchen denosumab (PROLIA) 60 MG/ML SOLN injection Inject 60 mg into the skin every 6 (six) months. Administer in upper arm, thigh, or abdomen    . ferrous gluconate (FERGON) 324 MG tablet Take 324 mg by mouth once a week. Takes 2 tabs on empty stomach with OJ on Monday.    . fluticasone (FLONASE) 50 MCG/ACT nasal spray Place 2 sprays into both nostrils daily. 16 g 6  . lisinopril (PRINIVIL,ZESTRIL) 20 MG tablet TAKE 1 TABLET AT BEDTIME. 90 tablet 1  . loperamide (IMODIUM) 2 MG capsule Take 1 capsule (2 mg  total) by mouth as needed for diarrhea or loose stools. 20 capsule 0  . mupirocin ointment (BACTROBAN) 2 % Apply 1 application topically 2 (two) times daily. To left elbow 7-10 days 22 g 0  . simvastatin (ZOCOR) 40 MG tablet TAKE 1 TABLET AT BEDTIME. 90 tablet 2  . traMADol (ULTRAM) 50 MG tablet Take 1 tablet (50 mg total) by mouth every 6 (six) hours as needed. for pain 60 tablet 5   No current facility-administered medications for this encounter.    Physical Findings: The patient is in no acute distress. Patient is alert and oriented.  height is 5\' 2"  (1.575 m) and weight is 107 lb (48.535 kg). Her oral temperature is 98 F (36.7 C). Her blood pressure is 167/58 and her pulse is 75. Her respiration is 16 and oxygen saturation is 100%. .   On the left olecranon process, she has enlargement with skin breakdown and using of a previously treated nodule. This site received 40 Gray in 10 fractions. This larger dominant nodule seems to be fixed to underlying structures.    Along the top of her left forearm, she has a few smaller inactive nodules.   Lab Findings: Lab Results  Component Value  Date   WBC 3.9 12/24/2014   WBC 4.2 08/16/2014   HGB 11.1* 12/24/2014   HGB 11.1* 08/16/2014   HCT 33.2* 12/24/2014   HCT 33.4* 08/16/2014   PLT 146 12/24/2014   PLT 158.0 08/16/2014    Lab Results  Component Value Date   NA 140 12/24/2014   NA 137 08/16/2014   K 4.2 12/24/2014   K 4.2 08/16/2014   CHLORIDE 104 12/24/2014   CO2 26 12/24/2014   CO2 35* 08/16/2014   GLUCOSE 93 12/24/2014   GLUCOSE 95 08/16/2014   GLUCOSE 98 05/02/2013   BUN 19.0 12/24/2014   BUN 15 08/16/2014   CREATININE 0.9 12/24/2014   CREATININE 1.0 08/16/2014   CREATININE 0.98 05/17/2012   BILITOT 1.10 12/24/2014   BILITOT 1.1 08/16/2014   ALKPHOS 46 12/24/2014   ALKPHOS 44 08/16/2014   AST 22 12/24/2014   AST 22 08/16/2014   ALT 21 12/24/2014   ALT 14 08/16/2014   PROT 5.9* 12/24/2014   PROT 7.0 08/16/2014    ALBUMIN 3.6 12/24/2014   ALBUMIN 4.1 08/16/2014   CALCIUM 8.8 12/24/2014   CALCIUM 9.3 08/16/2014   ANIONGAP 9 12/24/2014    Radiographic Findings: Ct Chest W Contrast  01/07/2015   CLINICAL DATA:  Unilateral vocal cord paralysis. Hoarseness. Left-sided. Breast cancer diagnosed in 1994 with chemotherapy and radiation therapy complete. Ovarian cancer in 2004. Chemotherapy completing. Hysterectomy. Left mastectomy.  EXAM: CT CHEST WITH CONTRAST  TECHNIQUE: Multidetector CT imaging of the chest was performed during intravenous contrast administration.  CONTRAST:  15mL OMNIPAQUE IOHEXOL 300 MG/ML  SOLN  COMPARISON:  05/02/2013.  Neck CT of 10/19/2014.  FINDINGS: Mediastinum/Nodes: No supraclavicular adenopathy. Beam hardening artifact from left shoulder arthroplasty degrades evaluation of the upper chest. A right Port-A-Cath terminates at the low SVC. Left mastectomy. Left axillary node dissection. No axillary adenopathy.  Aortic and branch vessel atherosclerosis. Normal heart size, normal heart size. Trace anterior pericardial fluid or thickening, similar. Multivessel coronary artery atherosclerosis. No central pulmonary embolism, on this non-dedicated study. No mediastinal or hilar adenopathy. No internal mammary adenopathy.  Lungs/Pleura: Trace left pleural fluid or thickening, similar. 3 mm central right lower lobe pulmonary nodule on image 45 is similar to on image 45 of the prior exam, strongly suggesting a benign etiology. Similar appearance of volume loss and traction bronchiectasis in the medial left lung apex and left upper lobe. Example image 14 of series 5. There is also minimal subpleural radiation fibrosis in the anterior left upper lobe.  Upper abdomen: Focal steatosis adjacent the falciform ligament. splenule. Normal imaged portions of the stomach, pancreas. Scarred kidneys. Cholecystectomy.  Musculoskeletal: Severe compression deformity at T12 with focal kyphosis. This is similar. Similar  ventral canal encroachment. Similar sclerosis involving the lateral right fifth rib. Favored to represent a bone island.  IMPRESSION: 1. Similar appearance of radiation fibrosis in the medial left apex and upper lobe. No other explanation for unilateral vocal cord paralysis. 2. Left mastectomy, without metastatic disease. 3. A right lower lobe pulmonary nodule is similar to 05/02/2013, strongly favoring a benign etiology. 4. Chronic T12 compression deformity, severe, with ventral canal encroachment.   Electronically Signed   By: Abigail Miyamoto M.D.   On: 01/07/2015 08:55    Impression:  The patient has developed increasing size and overlying skin breakdown of a left-sided cutaneous olecranon nodule which previously received a total dose of 40 Gy in 10 fractions. I have some concerns about repeating radiotherapy to this area given the risk  for chronic wound. While I don't suspect the patient is an ideal candidate for surgical resection at her advanced age or given the depth of invasion of this lesion, I would be interested in a surgical opinion from Dr. Clovis Riley who has previously resected some of her other cutaneous nodules.  Plan:  Today, I will set up a referral to Dr. Clovis Riley at [facility] to discuss possible surgical salvage versus reirradiation of the left olecranon lesion.  _____________________________________  Sheral Apley Tammi Klippel, M.D.

## 2015-01-08 NOTE — Telephone Encounter (Signed)
Spoke with Judy Herrera and told her the results of the Ct scan as noted below by Dr. Marko Plume.  Ms. Bhattacharyya verbalized understanding.

## 2015-01-08 NOTE — Telephone Encounter (Signed)
-----   Message from Gordy Levan, MD sent at 01/07/2015  9:00 PM EST ----- Labs seen and need follow up: please let her know that the CT does not show anything other than the old scarring in left lung that would be causing the vocal cord paralysis - specifically nothing that looks like more cancer or other problems in central chest, so it does seem to be from the old scarring.  I will let Dr Radene Journey know about this CT, as he was considering options to improve the vocal cord symptoms.

## 2015-01-09 ENCOUNTER — Encounter: Payer: Self-pay | Admitting: Internal Medicine

## 2015-01-09 ENCOUNTER — Ambulatory Visit
Admission: RE | Admit: 2015-01-09 | Discharge: 2015-01-09 | Disposition: A | Discharge status: Home / Self Care | Payer: Medicare HMO | Source: Ambulatory Visit | Attending: Radiation Oncology | Admitting: Radiation Oncology

## 2015-01-09 ENCOUNTER — Ambulatory Visit: Payer: Medicare HMO | Admitting: Internal Medicine

## 2015-01-09 ENCOUNTER — Encounter: Payer: Self-pay | Admitting: Radiation Oncology

## 2015-01-09 ENCOUNTER — Ambulatory Visit (INDEPENDENT_AMBULATORY_CARE_PROVIDER_SITE_OTHER): Payer: Medicare HMO | Admitting: Internal Medicine

## 2015-01-09 VITALS — BP 167/58 | HR 75 | Temp 98.0°F | Resp 16 | Ht 62.0 in | Wt 107.0 lb

## 2015-01-09 VITALS — BP 138/76 | HR 74 | Temp 97.9°F | Resp 18 | Ht 62.0 in | Wt 106.0 lb

## 2015-01-09 DIAGNOSIS — Z923 Personal history of irradiation: Secondary | ICD-10-CM | POA: Diagnosis not present

## 2015-01-09 DIAGNOSIS — C4499 Other specified malignant neoplasm of skin, unspecified: Secondary | ICD-10-CM

## 2015-01-09 DIAGNOSIS — I10 Essential (primary) hypertension: Secondary | ICD-10-CM

## 2015-01-09 DIAGNOSIS — I2510 Atherosclerotic heart disease of native coronary artery without angina pectoris: Secondary | ICD-10-CM

## 2015-01-09 DIAGNOSIS — Z7982 Long term (current) use of aspirin: Secondary | ICD-10-CM | POA: Diagnosis not present

## 2015-01-09 DIAGNOSIS — Z7951 Long term (current) use of inhaled steroids: Secondary | ICD-10-CM | POA: Diagnosis not present

## 2015-01-09 MED ORDER — TRAMADOL HCL 50 MG PO TABS: 50.0000 mg | ORAL_TABLET | Freq: Four times a day (QID) | ORAL | Status: DC | PRN

## 2015-01-09 NOTE — Progress Notes (Signed)
See progress note under physician encounter. 

## 2015-01-09 NOTE — Progress Notes (Addendum)
Patient referred back to Dr. Tammi Klippel for evaluation of previous treated LUE porocarcinoma. Multiple transit lesions of porocarcinoma from left hand to upper arm noted. Most lesions are 2-5 mm in size. Largest lesion is fungating and located near the patient's left elbow. Patient reports apply bactroban ointment and a bandaid daily to this fungating lesion. Patient denies pain. Patient explains that Dr. Allyson Sabal removed several lesions of from her left arm earlier in the week. Edema of distal LUE noted. Limited ROM of LUE related to old shoulder injury. Additionally, patient reports Dr. Allyson Sabal removed a skin lesion of a different pathology on her anterior neck. Patient's voice is hoarse. Patient explains that she understands from Dr. Lucia Gaskins her left vocal cord "isn't working but, they don't know why." Patient received left breast radiation in 1994. Weight and vitals stable.

## 2015-01-09 NOTE — Patient Instructions (Signed)
Limit your sodium (Salt) intake  Please check your blood pressure on a regular basis.  If it is consistently greater than 150/90, please make an office appointment.  Return in 6 months for follow-up   

## 2015-01-09 NOTE — Progress Notes (Signed)
Pre visit review using our clinic review tool, if applicable. No additional management support is needed unless otherwise documented below in the visit note. 

## 2015-01-09 NOTE — Progress Notes (Signed)
Subjective:    Patient ID: Judy Herrera, female    DOB: 07-29-1931, 81 y.o.   MRN: [id number]  HPI 81 year old patient who has a complex past medical history.  She has been seen recently by ENT.  Oncology and dermatology.  In general doing reasonably well.  She has treated hypertension that has been quite stable. No major concerns or complaints today   Wt Readings from Last 3 Encounters:  01/09/15 106 lb (48.081 kg)  12/26/14 105 lb 1.9 oz (47.682 kg)  12/24/14 105 lb 4.8 oz (47.764 kg)    BP Readings from Last 3 Encounters:  01/09/15 138/76  12/26/14 140/70  12/24/14 151/61    Past Medical History  Diagnosis Date  . Hypertension   . Allergy   . Cataract   . CAD (coronary artery disease)   . Hyperlipidemia   . Cardiomyopathy     improved  . Arthritis   . Osteoporosis   . History of radiation therapy 1994    left chest wall  . History of chemotherapy   . Wears glasses   . Wears dentures     full bottom-partial top  . Cancer   . Ovarian ca   . Breast CA   . Eccrine porocarcinoma of skin     History   Social History  . Marital Status: Widowed    Spouse Name: N/A  . Number of Children: 1  . Years of Education: N/A   Occupational History  . Not on file.   Social History Main Topics  . Smoking status: Never Smoker   . Smokeless tobacco: Never Used  . Alcohol Use: No  . Drug Use: No  . Sexual Activity: Not on file     Comment: didn't ask   Other Topics Concern  . Not on file   Social History Narrative    Past Surgical History  Procedure Laterality Date  . Left breast mastectomy  1994    T3 N1 Mx  . Shoulder arthroscopy    . Right leg  1990    fx  . Shoulder surgery  1990    replacement-lt  . Modified radical mastectomy w/ axillary lymph node dissection w/ pectoralis minor excision  1994    Left  . Eye surgery      CATARACTS  . Abdominal hysterectomy  2004  . Cholecystectomy  1993  . Breast surgery    . Skin biopsy  04/27/2011   left hand, forearm and posterior arm  . Mass excision Left 05/09/2014    Procedure: EXCISION OF NODULE LEFT CHEST ;  Surgeon: Pedro Earls, MD;  Location: [facility];  Service: General;  Laterality: Left;    Family History  Problem Relation Age of Onset  . Breast cancer Sister     dx in her 57s  . Heart disease Sister   . Heart disease Father   . Heart disease Mother   . Stroke Mother   . Prostate cancer Maternal Uncle   . Stroke Maternal Grandmother   . Testicular cancer Maternal Grandfather   . Stroke Maternal Grandfather   . Stroke Paternal Grandmother   . Heart disease Paternal Grandfather   . Breast cancer Sister 49    BRCA2+  . Breast cancer Sister 48  . Prostate cancer Maternal Uncle   . Breast cancer Other     maternal great aunt dx later in life    Allergies  Allergen Reactions  . Ibuprofen Nausea Only  .  Morphine Nausea And Vomiting    Current Outpatient Prescriptions on File Prior to Visit  Medication Sig Dispense Refill  . aspirin EC 81 MG tablet Take 81 mg by mouth at bedtime.     . Calcium Carbonate-Vitamin D (CALCIUM + D PO) Take 600 mg by mouth daily after lunch.     . carvedilol (COREG) 25 MG tablet TAKE 1 TABLET 2 TIMES A DAY WITH FOOD. 180 tablet 1  . Cholecalciferol (VITAMIN D) 2000 UNITS tablet Take 2,000 Units by mouth daily.    . Coenzyme Q10 (CO Q 10) 100 MG CAPS Take 1 capsule by mouth daily.    Marland Kitchen denosumab (PROLIA) 60 MG/ML SOLN injection Inject 60 mg into the skin every 6 (six) months. Administer in upper arm, thigh, or abdomen    . ferrous gluconate (FERGON) 324 MG tablet Take 324 mg by mouth once a week. Takes 2 tabs on empty stomach with OJ on Monday.    . fluticasone (FLONASE) 50 MCG/ACT nasal spray Place 2 sprays into both nostrils daily. 16 g 6  . lisinopril (PRINIVIL,ZESTRIL) 20 MG tablet TAKE 1 TABLET AT BEDTIME. 90 tablet 1  . loperamide (IMODIUM) 2 MG capsule Take 1 capsule (2 mg total) by mouth as needed for diarrhea  or loose stools. 20 capsule 0  . mupirocin ointment (BACTROBAN) 2 % Apply 1 application topically 2 (two) times daily. To left elbow 7-10 days 22 g 0  . simvastatin (ZOCOR) 40 MG tablet TAKE 1 TABLET AT BEDTIME. 90 tablet 2   No current facility-administered medications on file prior to visit.    BP 138/76 mmHg  Pulse 74  Temp(Src) 97.9 F (36.6 C) (Oral)  Resp 18  Ht 5' 2"  (1.575 m)  Wt 106 lb (48.081 kg)  BMI 19.38 kg/m2  SpO2 95%     Review of Systems  Constitutional: Negative.   HENT: Positive for voice change. Negative for congestion, dental problem, hearing loss, rhinorrhea, sinus pressure, sore throat and tinnitus.   Eyes: Negative for pain, discharge and visual disturbance.  Respiratory: Negative for cough and shortness of breath.   Cardiovascular: Negative for chest pain, palpitations and leg swelling.  Gastrointestinal: Negative for nausea, vomiting, abdominal pain, diarrhea, constipation, blood in stool and abdominal distention.  Genitourinary: Negative for dysuria, urgency, frequency, hematuria, flank pain, vaginal bleeding, vaginal discharge, difficulty urinating, vaginal pain and pelvic pain.  Musculoskeletal: Negative for joint swelling, arthralgias and gait problem.  Skin: Positive for rash.  Neurological: Negative for dizziness, syncope, speech difficulty, weakness, numbness and headaches.  Hematological: Negative for adenopathy.  Psychiatric/Behavioral: Negative for behavioral problems, dysphoric mood and agitation. The patient is not nervous/anxious.        Objective:   Physical Exam  Constitutional: She is oriented to person, place, and time. She appears well-developed and well-nourished.  Blood pressure well controlled hoarse  HENT:  Head: Normocephalic.  Right Ear: External ear normal.  Left Ear: External ear normal.  Mouth/Throat: Oropharynx is clear and moist.  Eyes: Conjunctivae and EOM are normal. Pupils are equal, round, and reactive to light.    Neck: Normal range of motion. Neck supple. No thyromegaly present.  Cardiovascular: Normal rate, regular rhythm, normal heart sounds and intact distal pulses.   Pulmonary/Chest: Effort normal and breath sounds normal.  Abdominal: Soft. Bowel sounds are normal. She exhibits no mass. There is no tenderness.  Musculoskeletal: Normal range of motion.  Lymphadenopathy:    She has no cervical adenopathy.  Neurological: She is alert and  oriented to person, place, and time.  Skin: Skin is warm and dry. No rash noted.  Multiple in transit lesions of porocarcinoma from left hand to upper arm, most 2-5 mm, most hyperkeratotic. Largest area is fungating ~ 3 x 3.5 cm at left elbow  Psychiatric: She has a normal mood and affect. Her behavior is normal.          Assessment & Plan:   Hypertension, well-controlled Coronary artery disease, stable Eccrine porocarcinoma of skin   We'll continue low salt diet and home blood pressure monitoring Follow-up multiple consultants Recheck 6 months or as needed

## 2015-01-21 ENCOUNTER — Telehealth: Payer: Self-pay | Admitting: *Deleted

## 2015-01-21 NOTE — Telephone Encounter (Signed)
CALLED PATIENT TO INFORM OF APPT. WITH DR. Neysa Bonito ON 02-13-15 - ARRIVAL TIME - 10:15 AM, SPOKE WITH PATIENT AND SHE IS AWARE OF THIS APPT.

## 2015-01-22 ENCOUNTER — Other Ambulatory Visit: Payer: Self-pay | Admitting: Internal Medicine

## 2015-01-22 ENCOUNTER — Emergency Department
Admission: EM | Admit: 2015-01-22 | Discharge: 2015-01-22 | Disposition: A | Discharge status: Home / Self Care | Payer: Medicare HMO | Source: Home / Self Care

## 2015-01-22 ENCOUNTER — Encounter: Payer: Self-pay | Admitting: *Deleted

## 2015-01-22 DIAGNOSIS — R300 Dysuria: Secondary | ICD-10-CM

## 2015-01-22 MED ORDER — CEPHALEXIN 500 MG PO CAPS: ORAL_CAPSULE | ORAL | Status: DC

## 2015-01-22 NOTE — ED Notes (Signed)
Pt c/o dysuria x last night. Denies fever or hematuria. Taken AZO today.

## 2015-01-22 NOTE — Discharge Instructions (Signed)
May use non-prescription AZO for about two days, if desired, to decrease urinary discomfort.  Drink plenty of fluids.  If symptoms become significantly worse during the night or over the weekend, proceed to the local emergency room.    Dysuria Dysuria is the medical term for pain with urination. There are many causes for dysuria, but urinary tract infection is the most common. If a urinalysis was performed it can show that there is a urinary tract infection. A urine culture confirms that you or your child is sick. You will need to follow up with a healthcare provider because:  If a urine culture was done you will need to know the culture results and treatment recommendations.  If the urine culture was positive, you or your child will need to be put on antibiotics or know if the antibiotics prescribed are the right antibiotics for your urinary tract infection.  If the urine culture is negative (no urinary tract infection), then other causes may need to be explored or antibiotics need to be stopped. Today laboratory work may have been done and there does not seem to be an infection. If cultures were done they will take at least 24 to 48 hours to be completed. Today x-rays may have been taken and they read as normal. No cause can be found for the problems. The x-rays may be re-read by a radiologist and you will be contacted if additional findings are made. You or your child may have been put on medications to help with this problem until you can see your primary caregiver. If the problems get better, see your primary caregiver if the problems return. If you were given antibiotics (medications which kill germs), take all of the mediations as directed for the full course of treatment.  If laboratory work was done, you need to find the results. Leave a telephone number where you can be reached. If this is not possible, make sure you find out how you are to get test results. HOME CARE INSTRUCTIONS   Drink  lots of fluids. For adults, drink eight, 8 ounce glasses of clear juice or water a day. For children, replace fluids as suggested by your caregiver.  Empty the bladder often. Avoid holding urine for long periods of time.  After a bowel movement, women should cleanse front to back, using each tissue only once.  Empty your bladder before and after sexual intercourse.  Take all the medicine given to you until it is gone. You may feel better in a few days, but TAKE ALL MEDICINE.  Avoid caffeine, tea, alcohol and carbonated beverages, because they tend to irritate the bladder.  In men, alcohol may irritate the prostate.  Only take over-the-counter or prescription medicines for pain, discomfort, or fever as directed by your caregiver.  If your caregiver has given you a follow-up appointment, it is very important to keep that appointment. Not keeping the appointment could result in a chronic or permanent injury, pain, and disability. If there is any problem keeping the appointment, you must call back to this facility for assistance. SEEK IMMEDIATE MEDICAL CARE IF:   Back pain develops.  A fever develops.  There is nausea (feeling sick to your stomach) or vomiting (throwing up).  Problems are no better with medications or are getting worse. MAKE SURE YOU:   Understand these instructions.  Will watch your condition.  Will get help right away if you are not doing well or get worse. Document Released: 07/31/2004 Document Revised: 01/25/2012 Document  Reviewed: 06/07/2008 Exit[employer] Patient Information 2015 [place], [place]. This information is not intended to replace advice given to you by your health care provider. Make sure you discuss any questions you have with your health care provider.

## 2015-01-22 NOTE — ED Provider Notes (Signed)
CSN: [id number]     Arrival date & time 01/22/15  1616 History   None    Chief Complaint  Patient presents with  . Dysuria      HPI Comments: Patient developed dysuria last night; she feels well otherwise.  Patient is a 81 y.o. female presenting with dysuria. The history is provided by the patient.  Dysuria Pain quality:  Burning Pain severity:  Mild Onset quality:  Sudden Duration:  1 day Timing:  Constant Progression:  Unchanged Chronicity:  New Recent urinary tract infections: no   Relieved by:  Phenazopyridine Worsened by:  Nothing tried Urinary symptoms: frequent urination   Urinary symptoms: no discolored urine, no foul-smelling urine, no hematuria, no hesitancy and no bladder incontinence   Associated symptoms: no abdominal pain, no fever, no flank pain, no genital lesions, no nausea, no vaginal discharge and no vomiting     Past Medical History  Diagnosis Date  . Hypertension   . Allergy   . Cataract   . CAD (coronary artery disease)   . Hyperlipidemia   . Cardiomyopathy     improved  . Arthritis   . Osteoporosis   . History of radiation therapy 1994    left chest wall  . History of chemotherapy   . Wears glasses   . Wears dentures     full bottom-partial top  . Cancer   . Ovarian ca   . Breast CA   . Eccrine porocarcinoma of skin    Past Surgical History  Procedure Laterality Date  . Left breast mastectomy  1994    T3 N1 Mx  . Shoulder arthroscopy    . Right leg  1990    fx  . Shoulder surgery  1990    replacement-lt  . Modified radical mastectomy w/ axillary lymph node dissection w/ pectoralis minor excision  1994    Left  . Eye surgery      CATARACTS  . Abdominal hysterectomy  2004  . Cholecystectomy  1993  . Breast surgery    . Skin biopsy  04/27/2011    left hand, forearm and posterior arm  . Mass excision Left 05/09/2014    Procedure: EXCISION OF NODULE LEFT CHEST ;  Surgeon: Pedro Earls, MD;  Location: [place];   Service: General;  Laterality: Left;   Family History  Problem Relation Age of Onset  . Breast cancer Sister     dx in her 65s  . Heart disease Sister   . Heart disease Father   . Heart disease Mother   . Stroke Mother   . Prostate cancer Maternal Uncle   . Stroke Maternal Grandmother   . Testicular cancer Maternal Grandfather   . Stroke Maternal Grandfather   . Stroke Paternal Grandmother   . Heart disease Paternal Grandfather   . Breast cancer Sister 41    BRCA2+  . Breast cancer Sister 33  . Prostate cancer Maternal Uncle   . Breast cancer Other     maternal great aunt dx later in life   History  Substance Use Topics  . Smoking status: Never Smoker   . Smokeless tobacco: Never Used  . Alcohol Use: No   OB History    No data available     Review of Systems  Constitutional: Negative for fever.  Gastrointestinal: Negative for nausea, vomiting and abdominal pain.  Genitourinary: Positive for dysuria. Negative for flank pain and vaginal discharge.  All other systems reviewed and are negative.  Allergies  Ibuprofen and Morphine  Home Medications   Prior to Admission medications   Medication Sig Start Date End Date Taking? Authorizing Provider  aspirin EC 81 MG tablet Take 81 mg by mouth at bedtime.     Historical Provider, MD  Calcium Carbonate-Vitamin D (CALCIUM + D PO) Take 600 mg by mouth daily after lunch.     Historical Provider, MD  carvedilol (COREG) 25 MG tablet TAKE 1 TABLET 2 TIMES A DAY WITH FOOD. 08/13/14   Marletta Lor, MD  cephALEXin [facility]) 500 MG capsule Take one capsule by mouth every 12 hours 01/22/15   Kandra Nicolas, MD  Cholecalciferol (VITAMIN D) 2000 UNITS tablet Take 2,000 Units by mouth daily.    Historical Provider, MD  Coenzyme Q10 (CO Q 10) 100 MG CAPS Take 1 capsule by mouth daily.    Historical Provider, MD  denosumab (PROLIA) 60 MG/ML SOLN injection Inject 60 mg into the skin every 6 (six) months. Administer in upper arm, thigh,  or abdomen    Historical Provider, MD  ferrous gluconate (FERGON) 324 MG tablet Take 324 mg by mouth once a week. Takes 2 tabs on empty stomach with OJ on Monday. 04/01/12   Lennis Marion Downer, MD  fluticasone (FLONASE) 50 MCG/ACT nasal spray Place 2 sprays into both nostrils daily. 10/02/14   Marletta Lor, MD  lisinopril (PRINIVIL,ZESTRIL) 20 MG tablet TAKE 1 TABLET AT BEDTIME. 12/06/14   Marletta Lor, MD  loperamide (IMODIUM) 2 MG capsule Take 1 capsule (2 mg total) by mouth as needed for diarrhea or loose stools. 01/24/13   Lennis Marion Downer, MD  mupirocin ointment (BACTROBAN) 2 % Apply 1 application topically 2 (two) times daily. To left elbow 7-10 days 12/24/14   Gordy Levan, MD  simvastatin (ZOCOR) 40 MG tablet TAKE 1 TABLET AT BEDTIME. 05/01/14   Marletta Lor, MD  traMADol (ULTRAM) 50 MG tablet Take 1 tablet (50 mg total) by mouth every 6 (six) hours as needed. for pain 01/09/15   Marletta Lor, MD   BP 135/64 mmHg  Pulse 71  Temp(Src) 97.9 F (36.6 C) (Oral)  Resp 12  Wt 105 lb (47.628 kg)  SpO2 95% Physical Exam Nursing notes and Vital Signs reviewed. Appearance:  Patient appears stated age, and in no acute distress Eyes:  Pupils are equal and round  Nose:  Normal Pharynx:  Normal Neck:  Supple.  No adenopathy  Lungs:  Clear to auscultation.  Breath sounds are equal.  Heart:  Regular rate and rhythm without murmurs, rubs, or gallops.  Abdomen:  Nontender without masses or hepatosplenomegaly.  Bowel sounds are present.  No CVA or flank tenderness.  Extremities:  No edema.  No calf tenderness Skin:  No rash present.   ED Course  Procedures  none    Labs Reviewed  URINE CULTURE      MDM   1. Dysuria; suspect cystitis   Urine culture pending.  Begin Keflex 585m BID May use non-prescription AZO for about two days, if desired, to decrease urinary discomfort.  Drink plenty of fluids.  If symptoms become significantly worse during the night or over  the weekend, proceed to the local emergency room.  Followup with Family Doctor if not improved in one week.     SKandra Nicolas MD 01/22/15 [phone]

## 2015-01-24 ENCOUNTER — Telehealth: Payer: Self-pay | Admitting: Emergency Medicine

## 2015-01-26 LAB — URINE CULTURE

## 2015-02-15 ENCOUNTER — Other Ambulatory Visit: Payer: Self-pay | Admitting: Oncology

## 2015-02-15 DIAGNOSIS — C4499 Other specified malignant neoplasm of skin, unspecified: Secondary | ICD-10-CM

## 2015-02-15 DIAGNOSIS — C569 Malignant neoplasm of unspecified ovary: Secondary | ICD-10-CM

## 2015-02-18 ENCOUNTER — Ambulatory Visit (HOSPITAL_BASED_OUTPATIENT_CLINIC_OR_DEPARTMENT_OTHER): Payer: Medicare HMO

## 2015-02-18 ENCOUNTER — Ambulatory Visit (HOSPITAL_BASED_OUTPATIENT_CLINIC_OR_DEPARTMENT_OTHER): Payer: Medicare HMO | Admitting: Oncology

## 2015-02-18 ENCOUNTER — Encounter: Payer: Self-pay | Admitting: Oncology

## 2015-02-18 ENCOUNTER — Telehealth: Payer: Self-pay | Admitting: Oncology

## 2015-02-18 ENCOUNTER — Other Ambulatory Visit (HOSPITAL_BASED_OUTPATIENT_CLINIC_OR_DEPARTMENT_OTHER): Payer: Medicare HMO

## 2015-02-18 VITALS — BP 159/67 | HR 76 | Temp 97.8°F | Resp 18 | Ht 62.0 in | Wt 105.7 lb

## 2015-02-18 DIAGNOSIS — Z853 Personal history of malignant neoplasm of breast: Secondary | ICD-10-CM

## 2015-02-18 DIAGNOSIS — D696 Thrombocytopenia, unspecified: Secondary | ICD-10-CM

## 2015-02-18 DIAGNOSIS — C569 Malignant neoplasm of unspecified ovary: Secondary | ICD-10-CM

## 2015-02-18 DIAGNOSIS — C7642 Malignant neoplasm of left upper limb: Secondary | ICD-10-CM | POA: Diagnosis not present

## 2015-02-18 DIAGNOSIS — I890 Lymphedema, not elsewhere classified: Secondary | ICD-10-CM | POA: Diagnosis not present

## 2015-02-18 DIAGNOSIS — Z1231 Encounter for screening mammogram for malignant neoplasm of breast: Secondary | ICD-10-CM

## 2015-02-18 DIAGNOSIS — Z95828 Presence of other vascular implants and grafts: Secondary | ICD-10-CM

## 2015-02-18 DIAGNOSIS — Z8543 Personal history of malignant neoplasm of ovary: Secondary | ICD-10-CM

## 2015-02-18 DIAGNOSIS — C4499 Other specified malignant neoplasm of skin, unspecified: Secondary | ICD-10-CM

## 2015-02-18 DIAGNOSIS — M810 Age-related osteoporosis without current pathological fracture: Secondary | ICD-10-CM

## 2015-02-18 DIAGNOSIS — D649 Anemia, unspecified: Secondary | ICD-10-CM

## 2015-02-18 DIAGNOSIS — J3801 Paralysis of vocal cords and larynx, unilateral: Secondary | ICD-10-CM

## 2015-02-18 LAB — CBC WITH DIFFERENTIAL/PLATELET
BASO%: 0.7 % (ref 0.0–2.0)
Basophils Absolute: 0 10*3/uL (ref 0.0–0.1)
EOS%: 2 % (ref 0.0–7.0)
Eosinophils Absolute: 0.1 10*3/uL (ref 0.0–0.5)
HCT: 33.9 % — ABNORMAL LOW (ref 34.8–46.6)
HGB: 11.2 g/dL — ABNORMAL LOW (ref 11.6–15.9)
LYMPH%: 16.4 % (ref 14.0–49.7)
MCH: 31.4 pg (ref 25.1–34.0)
MCHC: 33 g/dL (ref 31.5–36.0)
MCV: 95 fL (ref 79.5–101.0)
MONO#: 0.4 10*3/uL (ref 0.1–0.9)
MONO%: 8.1 % (ref 0.0–14.0)
NEUT#: 3.3 10*3/uL (ref 1.5–6.5)
NEUT%: 72.8 % (ref 38.4–76.8)
PLATELETS: 138 10*3/uL — AB (ref 145–400)
RBC: 3.57 10*6/uL — AB (ref 3.70–5.45)
RDW: 13.1 % (ref 11.2–14.5)
WBC: 4.6 10*3/uL (ref 3.9–10.3)
lymph#: 0.8 10*3/uL — ABNORMAL LOW (ref 0.9–3.3)

## 2015-02-18 LAB — COMPREHENSIVE METABOLIC PANEL (CC13)
ALBUMIN: 3.8 g/dL (ref 3.5–5.0)
ALT: 16 U/L (ref 0–55)
AST: 18 U/L (ref 5–34)
Alkaline Phosphatase: 46 U/L (ref 40–150)
Anion Gap: 13 mEq/L — ABNORMAL HIGH (ref 3–11)
BUN: 17.5 mg/dL (ref 7.0–26.0)
CO2: 26 mEq/L (ref 22–29)
Calcium: 9.4 mg/dL (ref 8.4–10.4)
Chloride: 102 mEq/L (ref 98–109)
Creatinine: 0.9 mg/dL (ref 0.6–1.1)
EGFR: 57 mL/min/{1.73_m2} — ABNORMAL LOW (ref >90)
Glucose: 91 mg/dl (ref 70–140)
Potassium: 4 mEq/L (ref 3.5–5.1)
Sodium: 141 mEq/L (ref 136–145)
Total Bilirubin: 1.27 mg/dL — ABNORMAL HIGH (ref 0.20–1.20)
Total Protein: 6.2 g/dL — ABNORMAL LOW (ref 6.4–8.3)

## 2015-02-18 MED ORDER — HEPARIN SOD (PORK) LOCK FLUSH 100 UNIT/ML IV SOLN
500.0000 [IU] | Freq: Once | INTRAVENOUS | Status: AC
  Administered 2015-02-18: 500 [IU] via INTRAVENOUS
  Filled 2015-02-18: qty 5

## 2015-02-18 MED ORDER — SODIUM CHLORIDE 0.9 % IJ SOLN
10.0000 mL | INTRAMUSCULAR | Status: DC | PRN
  Administered 2015-02-18: 10 mL via INTRAVENOUS
  Filled 2015-02-18: qty 10

## 2015-02-18 NOTE — Telephone Encounter (Signed)
per pof to sch pt appt-gave pt copy of sch-sch pt mamma
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
## 2015-02-18 NOTE — Patient Instructions (Signed)

## 2015-03-06 ENCOUNTER — Other Ambulatory Visit: Payer: Self-pay | Admitting: Internal Medicine

## 2015-04-02 ENCOUNTER — Encounter: Payer: Self-pay | Admitting: Oncology

## 2015-04-02 NOTE — Progress Notes (Signed)
Medical Oncology  Received note from Dr Ronnette Hila at [facility] dated 03-27-15, will be scanned into this EMR. "excision of two large lesions on posterior forearm and anterior bicep on 03-12-15.Marland KitchenMarland Kitchenappears to be healing well. Return to clinic as needed"  L.Livesay, MD

## 2015-04-15 ENCOUNTER — Other Ambulatory Visit: Payer: Self-pay | Admitting: Oncology

## 2015-04-15 DIAGNOSIS — C569 Malignant neoplasm of unspecified ovary: Secondary | ICD-10-CM

## 2015-04-15 DIAGNOSIS — C4499 Other specified malignant neoplasm of skin, unspecified: Secondary | ICD-10-CM

## 2015-04-15 DIAGNOSIS — Z853 Personal history of malignant neoplasm of breast: Secondary | ICD-10-CM

## 2015-04-18 ENCOUNTER — Other Ambulatory Visit (HOSPITAL_BASED_OUTPATIENT_CLINIC_OR_DEPARTMENT_OTHER): Payer: Medicare HMO

## 2015-04-18 ENCOUNTER — Encounter: Payer: Self-pay | Admitting: Oncology

## 2015-04-18 ENCOUNTER — Telehealth: Payer: Self-pay | Admitting: Oncology

## 2015-04-18 ENCOUNTER — Ambulatory Visit (HOSPITAL_BASED_OUTPATIENT_CLINIC_OR_DEPARTMENT_OTHER): Payer: Medicare HMO

## 2015-04-18 ENCOUNTER — Ambulatory Visit (HOSPITAL_BASED_OUTPATIENT_CLINIC_OR_DEPARTMENT_OTHER): Payer: Medicare HMO | Admitting: Oncology

## 2015-04-18 VITALS — BP 163/54 | HR 77 | Temp 98.6°F | Resp 18 | Ht 62.0 in | Wt 105.8 lb

## 2015-04-18 DIAGNOSIS — Z95828 Presence of other vascular implants and grafts: Secondary | ICD-10-CM

## 2015-04-18 DIAGNOSIS — I890 Lymphedema, not elsewhere classified: Secondary | ICD-10-CM

## 2015-04-18 DIAGNOSIS — C569 Malignant neoplasm of unspecified ovary: Secondary | ICD-10-CM

## 2015-04-18 DIAGNOSIS — C44699 Other specified malignant neoplasm of skin of left upper limb, including shoulder: Secondary | ICD-10-CM | POA: Diagnosis not present

## 2015-04-18 DIAGNOSIS — C4499 Other specified malignant neoplasm of skin, unspecified: Secondary | ICD-10-CM

## 2015-04-18 DIAGNOSIS — D649 Anemia, unspecified: Secondary | ICD-10-CM | POA: Diagnosis not present

## 2015-04-18 DIAGNOSIS — Z853 Personal history of malignant neoplasm of breast: Secondary | ICD-10-CM

## 2015-04-18 DIAGNOSIS — M810 Age-related osteoporosis without current pathological fracture: Secondary | ICD-10-CM

## 2015-04-18 DIAGNOSIS — D696 Thrombocytopenia, unspecified: Secondary | ICD-10-CM | POA: Diagnosis not present

## 2015-04-18 DIAGNOSIS — E8989 Other postprocedural endocrine and metabolic complications and disorders: Secondary | ICD-10-CM

## 2015-04-18 DIAGNOSIS — Z8543 Personal history of malignant neoplasm of ovary: Secondary | ICD-10-CM

## 2015-04-18 DIAGNOSIS — J3801 Paralysis of vocal cords and larynx, unilateral: Secondary | ICD-10-CM

## 2015-04-18 LAB — COMPREHENSIVE METABOLIC PANEL (CC13)
ALT: 10 U/L (ref 0–55)
ANION GAP: 7 meq/L (ref 3–11)
AST: 16 U/L (ref 5–34)
Albumin: 3.5 g/dL (ref 3.5–5.0)
Alkaline Phosphatase: 54 U/L (ref 40–150)
BUN: 19.1 mg/dL (ref 7.0–26.0)
CALCIUM: 9.3 mg/dL (ref 8.4–10.4)
CO2: 28 mEq/L (ref 22–29)
CREATININE: 1 mg/dL (ref 0.6–1.1)
Chloride: 102 mEq/L (ref 98–109)
EGFR: 51 mL/min/{1.73_m2} — AB (ref >90)
Glucose: 97 mg/dl (ref 70–140)
Potassium: 4.3 mEq/L (ref 3.5–5.1)
Sodium: 138 mEq/L (ref 136–145)
Total Bilirubin: 1.14 mg/dL (ref 0.20–1.20)
Total Protein: 5.9 g/dL — ABNORMAL LOW (ref 6.4–8.3)

## 2015-04-18 LAB — CBC WITH DIFFERENTIAL/PLATELET
BASO%: 0.5 % (ref 0.0–2.0)
Basophils Absolute: 0 10*3/uL (ref 0.0–0.1)
EOS%: 3.6 % (ref 0.0–7.0)
Eosinophils Absolute: 0.2 10*3/uL (ref 0.0–0.5)
HCT: 32.2 % — ABNORMAL LOW (ref 34.8–46.6)
HEMOGLOBIN: 10.5 g/dL — AB (ref 11.6–15.9)
LYMPH%: 15.6 % (ref 14.0–49.7)
MCH: 31.6 pg (ref 25.1–34.0)
MCHC: 32.6 g/dL (ref 31.5–36.0)
MCV: 97 fL (ref 79.5–101.0)
MONO#: 0.4 10*3/uL (ref 0.1–0.9)
MONO%: 10.1 % (ref 0.0–14.0)
NEUT#: 2.9 10*3/uL (ref 1.5–6.5)
NEUT%: 70.2 % (ref 38.4–76.8)
Platelets: 128 10*3/uL — ABNORMAL LOW (ref 145–400)
RBC: 3.32 10*6/uL — ABNORMAL LOW (ref 3.70–5.45)
RDW: 13.1 % (ref 11.2–14.5)
WBC: 4.2 10*3/uL (ref 3.9–10.3)
lymph#: 0.7 10*3/uL — ABNORMAL LOW (ref 0.9–3.3)

## 2015-04-18 MED ORDER — SODIUM CHLORIDE 0.9 % IJ SOLN
10.0000 mL | INTRAMUSCULAR | Status: DC | PRN
  Administered 2015-04-18: 10 mL via INTRAVENOUS
  Filled 2015-04-18: qty 10

## 2015-04-18 MED ORDER — HEPARIN SOD (PORK) LOCK FLUSH 100 UNIT/ML IV SOLN
500.0000 [IU] | Freq: Once | INTRAVENOUS | Status: AC
  Administered 2015-04-18: 500 [IU] via INTRAVENOUS
  Filled 2015-04-18: qty 5

## 2015-04-18 NOTE — Telephone Encounter (Signed)
Appointments made and avs printed for patient °
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
## 2015-04-18 NOTE — Patient Instructions (Signed)

## 2015-04-19 DIAGNOSIS — D696 Thrombocytopenia, unspecified: Secondary | ICD-10-CM | POA: Insufficient documentation

## 2015-04-19 DIAGNOSIS — Z95828 Presence of other vascular implants and grafts: Secondary | ICD-10-CM | POA: Insufficient documentation

## 2015-04-19 DIAGNOSIS — E8989 Other postprocedural endocrine and metabolic complications and disorders: Secondary | ICD-10-CM | POA: Insufficient documentation

## 2015-04-19 DIAGNOSIS — I890 Lymphedema, not elsewhere classified: Secondary | ICD-10-CM | POA: Insufficient documentation

## 2015-04-19 DIAGNOSIS — J3801 Paralysis of vocal cords and larynx, unilateral: Secondary | ICD-10-CM | POA: Insufficient documentation

## 2015-04-19 DIAGNOSIS — D649 Anemia, unspecified: Secondary | ICD-10-CM | POA: Insufficient documentation

## 2015-04-19 DIAGNOSIS — C569 Malignant neoplasm of unspecified ovary: Secondary | ICD-10-CM | POA: Insufficient documentation

## 2015-04-19 LAB — CA 125: CA 125: 12 U/mL (ref <35)

## 2015-04-22 ENCOUNTER — Telehealth: Payer: Self-pay | Admitting: *Deleted

## 2015-04-22 ENCOUNTER — Other Ambulatory Visit: Payer: Self-pay | Admitting: Oncology

## 2015-04-22 ENCOUNTER — Other Ambulatory Visit: Payer: Self-pay | Admitting: *Deleted

## 2015-04-22 NOTE — Telephone Encounter (Signed)
Re triage note new area on elbow and PAC removal:  PAC just flushed 04-18-15, no rush to have it removed, so please cancel that with IR until MD can see her first. Fine to schedule back to LL in next few weeks when appointment time available. Does not need labs. I did not do POF  thanks

## 2015-04-22 NOTE — Telephone Encounter (Signed)
Patient called reporting she "has found a knot on her elbow/arm where the surgery was so she does not want to have the port-a-cath taken out with out having this looked into further.  I've been discharged from [facility] and followed by Dr. Marko Plume now."  Called patient who on 03-12-2015 had Malignant skin lesion excision by Dr. Clovis Riley at [facility].  "I noticed it Saturday to my left elbow.  It's bigger than a green pea in size.  No pain, I don't think there's any redness and it's not warm or hot to touch."  Will notify Dr. Marko Plume.

## 2015-04-22 NOTE — Telephone Encounter (Signed)
P.O.F. Generated for scheduler.

## 2015-04-23 ENCOUNTER — Telehealth: Payer: Self-pay | Admitting: Oncology

## 2015-04-23 NOTE — Telephone Encounter (Signed)
Left message to confirm appointment 07/05. Mailed calendar.

## 2015-04-23 NOTE — Progress Notes (Signed)
Called Tiffany with IR [phone]) to notify her patient does not want port-a-cath removed until evaluated by Dr. Marko Plume.  Tiffany reports it has not been scheduled yet however she cannot discontinue or cancel the order.  This nurse cancelled order.

## 2015-05-20 ENCOUNTER — Other Ambulatory Visit: Payer: Self-pay | Admitting: Oncology

## 2015-05-21 ENCOUNTER — Telehealth: Payer: Self-pay | Admitting: Oncology

## 2015-05-21 ENCOUNTER — Encounter: Payer: Self-pay | Admitting: Oncology

## 2015-05-21 ENCOUNTER — Telehealth: Payer: Self-pay | Admitting: *Deleted

## 2015-05-21 ENCOUNTER — Ambulatory Visit (HOSPITAL_BASED_OUTPATIENT_CLINIC_OR_DEPARTMENT_OTHER): Payer: Medicare HMO | Admitting: Oncology

## 2015-05-21 VITALS — BP 145/63 | HR 79 | Temp 98.2°F | Resp 18 | Ht 62.0 in | Wt 107.0 lb

## 2015-05-21 DIAGNOSIS — C44699 Other specified malignant neoplasm of skin of left upper limb, including shoulder: Secondary | ICD-10-CM

## 2015-05-21 DIAGNOSIS — J3801 Paralysis of vocal cords and larynx, unilateral: Secondary | ICD-10-CM

## 2015-05-21 DIAGNOSIS — D696 Thrombocytopenia, unspecified: Secondary | ICD-10-CM

## 2015-05-21 DIAGNOSIS — Z95828 Presence of other vascular implants and grafts: Secondary | ICD-10-CM

## 2015-05-21 DIAGNOSIS — Z853 Personal history of malignant neoplasm of breast: Secondary | ICD-10-CM

## 2015-05-21 DIAGNOSIS — I890 Lymphedema, not elsewhere classified: Secondary | ICD-10-CM

## 2015-05-21 DIAGNOSIS — C4499 Other specified malignant neoplasm of skin, unspecified: Secondary | ICD-10-CM

## 2015-05-21 DIAGNOSIS — E8989 Other postprocedural endocrine and metabolic complications and disorders: Secondary | ICD-10-CM

## 2015-05-21 DIAGNOSIS — C569 Malignant neoplasm of unspecified ovary: Secondary | ICD-10-CM

## 2015-05-21 DIAGNOSIS — M810 Age-related osteoporosis without current pathological fracture: Secondary | ICD-10-CM

## 2015-05-21 DIAGNOSIS — Z8543 Personal history of malignant neoplasm of ovary: Secondary | ICD-10-CM

## 2015-05-21 NOTE — Telephone Encounter (Signed)
Appointments made and avs printed for patient °

## 2015-05-21 NOTE — Telephone Encounter (Signed)
TC from patient regarding having her port removed. She is asking if it has been scheduled yet.  Since pt just saw Dr. Marko Plume this morning, scheduling has not had time to get this scheduled. Advised pt that she will be called when the procedure is scheduled.
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
## 2015-06-04 ENCOUNTER — Telehealth: Payer: Self-pay | Admitting: *Deleted

## 2015-06-04 ENCOUNTER — Other Ambulatory Visit: Payer: Self-pay | Admitting: Oncology

## 2015-06-04 DIAGNOSIS — C569 Malignant neoplasm of unspecified ovary: Secondary | ICD-10-CM

## 2015-06-04 DIAGNOSIS — D696 Thrombocytopenia, unspecified: Secondary | ICD-10-CM

## 2015-06-04 NOTE — Telephone Encounter (Signed)
Called Judy Herrera with Dr. Mariana Kaufman instructions.  Reviewed how to collect stool on hemoccult card.  Next F/U remains August 19, 2015.  Tiffany with IR notified.  Asked that we call IR to reschedule when needed.  No new order needed.

## 2015-06-04 NOTE — Telephone Encounter (Addendum)
Patient called reporting "stools are very black when I go to the bathroom for the past three to four days.  Bowels move every other day.  I'm scheduled to have port-a-cath removed Thursday.  Would like to know what Dr. Marko Plume thinks before I have this removed.  I may need to wait.  Do I need to contact my PCP?"  Denies taking iron pills for the past few months.  Denies eating any beets or foods dark in color.  No new medications.  No pain or blood in stool or toilet tissue.  Aware Dr. Marko Plume is out of office.  "I will call to cancel port-a-cath removal and wait on Dr. Marko Plume."  Return number [phone].

## 2015-06-04 NOTE — Telephone Encounter (Signed)
Re triage note black stools and wants to cancel PAC removal.  This is second time she has preferred to cancel PAC removal.  Suggest we just keep PAC in for now.  Please have her come in this week for PAC flush with labs - check CBC and iron studies then; also she needs to pick up hemoccult cards x 3 when she comes for the [facility] flush this week, to do at home and bring back when she sees Dr Josephina Shih on 7-29. Tell her to use toilet tissue after wiping from BM and touch to the windows on [facility] cards, 1 for each of 3 different BMs..  Desk RN please watch out for the CBC this week and for the hemoccult cards on 7-29. May need to show her the [facility] cards when she is here.   Please let pt know - you will have to be very clear for her to understand.  Please enter POF/orders: PAC flush with lab this week    CBC, iron IBC HC cards x 3 pick up this week and return with gyn onc apt.  PAC flush with labs to LL visit in early Oct.   If she is very weak or black stools continuing, always fine to call PCP.   Thank you

## 2015-06-06 ENCOUNTER — Inpatient Hospital Stay (HOSPITAL_COMMUNITY): Admission: RE | Admit: 2015-06-06 | Payer: Medicare HMO | Source: Ambulatory Visit

## 2015-06-06 ENCOUNTER — Other Ambulatory Visit: Payer: Medicare HMO

## 2015-06-06 ENCOUNTER — Ambulatory Visit (HOSPITAL_COMMUNITY): Admission: RE | Admit: 2015-06-06 | Payer: Medicare HMO | Source: Ambulatory Visit

## 2015-06-07 ENCOUNTER — Ambulatory Visit (HOSPITAL_BASED_OUTPATIENT_CLINIC_OR_DEPARTMENT_OTHER): Payer: Medicare HMO

## 2015-06-07 ENCOUNTER — Other Ambulatory Visit (HOSPITAL_BASED_OUTPATIENT_CLINIC_OR_DEPARTMENT_OTHER): Payer: Medicare HMO

## 2015-06-07 ENCOUNTER — Telehealth: Payer: Self-pay

## 2015-06-07 ENCOUNTER — Other Ambulatory Visit: Payer: Self-pay | Admitting: Internal Medicine

## 2015-06-07 VITALS — BP 117/48 | HR 74 | Temp 98.4°F

## 2015-06-07 DIAGNOSIS — D696 Thrombocytopenia, unspecified: Secondary | ICD-10-CM | POA: Diagnosis not present

## 2015-06-07 DIAGNOSIS — Z95828 Presence of other vascular implants and grafts: Secondary | ICD-10-CM

## 2015-06-07 DIAGNOSIS — D649 Anemia, unspecified: Secondary | ICD-10-CM

## 2015-06-07 DIAGNOSIS — C44699 Other specified malignant neoplasm of skin of left upper limb, including shoulder: Secondary | ICD-10-CM | POA: Diagnosis not present

## 2015-06-07 DIAGNOSIS — C569 Malignant neoplasm of unspecified ovary: Secondary | ICD-10-CM

## 2015-06-07 LAB — CBC WITH DIFFERENTIAL/PLATELET
BASO%: 0.3 % (ref 0.0–2.0)
Basophils Absolute: 0 10*3/uL (ref 0.0–0.1)
EOS%: 3 % (ref 0.0–7.0)
Eosinophils Absolute: 0.1 10*3/uL (ref 0.0–0.5)
HCT: 29.7 % — ABNORMAL LOW (ref 34.8–46.6)
HGB: 9.8 g/dL — ABNORMAL LOW (ref 11.6–15.9)
LYMPH%: 28 % (ref 14.0–49.7)
MCH: 31.5 pg (ref 25.1–34.0)
MCHC: 33 g/dL (ref 31.5–36.0)
MCV: 95.5 fL (ref 79.5–101.0)
MONO#: 0.3 10*3/uL (ref 0.1–0.9)
MONO%: 9.9 % (ref 0.0–14.0)
NEUT#: 2 10*3/uL (ref 1.5–6.5)
NEUT%: 58.8 % (ref 38.4–76.8)
PLATELETS: 132 10*3/uL — AB (ref 145–400)
RBC: 3.11 10*6/uL — ABNORMAL LOW (ref 3.70–5.45)
RDW: 13.1 % (ref 11.2–14.5)
WBC: 3.3 10*3/uL — ABNORMAL LOW (ref 3.9–10.3)
lymph#: 0.9 10*3/uL (ref 0.9–3.3)

## 2015-06-07 LAB — FERRITIN CHCC: Ferritin: 58 ng/ml (ref 9–269)

## 2015-06-07 LAB — IRON AND TIBC CHCC
%SAT: 19 % — ABNORMAL LOW (ref 21–57)
Iron: 49 ug/dL (ref 41–142)
TIBC: 264 ug/dL (ref 236–444)
UIBC: 215 ug/dL (ref 120–384)

## 2015-06-07 MED ORDER — SODIUM CHLORIDE 0.9 % IJ SOLN
10.0000 mL | INTRAMUSCULAR | Status: DC | PRN
  Administered 2015-06-07: 10 mL via INTRAVENOUS
  Filled 2015-06-07: qty 10

## 2015-06-07 MED ORDER — HEPARIN SOD (PORK) LOCK FLUSH 100 UNIT/ML IV SOLN
500.0000 [IU] | Freq: Once | INTRAVENOUS | Status: AC
Start: 2015-06-07 — End: 2015-06-07
  Administered 2015-06-07: 500 [IU] via INTRAVENOUS
  Filled 2015-06-07: qty 5

## 2015-06-07 NOTE — Patient Instructions (Signed)

## 2015-06-07 NOTE — Telephone Encounter (Signed)
Spoke with Judy Herrera regarding the results of CBC and plan as noted below by Dr. Marko Plume. Routed labs and note to Dr. Nonie Hoyer.

## 2015-06-07 NOTE — Telephone Encounter (Signed)
-----   Message from Gordy Levan, MD sent at 06/07/2015  2:23 PM EDT ----- Labs seen and need follow up:  Keep PAC. Need to be sure to follow up hemoccult cards. Have her increase iron back to daily. If HC + needs to be seen back by [facility] ( I don't know which MD has seen her in past). Set up PAC flush with lab and LL in 6-8 weeks. Route this note and CBC today to Dr Nonie Hoyer when you have finished.  thanks

## 2015-06-07 NOTE — Addendum Note (Signed)
Addended by: Baruch Merl on: 06/07/2015 06:31 PM   Modules accepted: Orders

## 2015-06-10 ENCOUNTER — Other Ambulatory Visit: Payer: Medicare HMO

## 2015-06-11 ENCOUNTER — Telehealth: Payer: Self-pay | Admitting: Oncology

## 2015-06-11 NOTE — Telephone Encounter (Signed)
Spoke with patient re appointment for 9/12

## 2015-06-12 ENCOUNTER — Telehealth: Payer: Self-pay

## 2015-06-12 NOTE — Telephone Encounter (Signed)
-----   Message from Cherylynn Ridges, RN sent at 06/04/2015  2:11 PM EDT ----- Regarding: Stool Card reminder Contact: [phone] Please call patient to remind her to return stool card on 06-14-2015 when she comes in to see Dr. Fermin Schwab.  Instruct her to use tissue after Bm and smear on window for stool sample on card.  New/different stool per window.  Thanks, [employer]

## 2015-06-12 NOTE — Telephone Encounter (Signed)
Reviewed stool card collection as noted below by Ms. Winston-Spruill with Ms. Monaco.  Patient verbalized understanding.

## 2015-06-14 ENCOUNTER — Other Ambulatory Visit (HOSPITAL_BASED_OUTPATIENT_CLINIC_OR_DEPARTMENT_OTHER): Payer: Medicare HMO | Admitting: *Deleted

## 2015-06-14 ENCOUNTER — Encounter: Payer: Self-pay | Admitting: Gynecology

## 2015-06-14 ENCOUNTER — Ambulatory Visit: Payer: Medicare HMO | Attending: Gynecology | Admitting: Gynecology

## 2015-06-14 ENCOUNTER — Telehealth: Payer: Self-pay

## 2015-06-14 VITALS — BP 153/65 | HR 72 | Temp 98.1°F | Resp 18 | Ht 62.0 in | Wt 106.1 lb

## 2015-06-14 DIAGNOSIS — D696 Thrombocytopenia, unspecified: Secondary | ICD-10-CM

## 2015-06-14 DIAGNOSIS — C44699 Other specified malignant neoplasm of skin of left upper limb, including shoulder: Secondary | ICD-10-CM

## 2015-06-14 DIAGNOSIS — C569 Malignant neoplasm of unspecified ovary: Secondary | ICD-10-CM | POA: Insufficient documentation

## 2015-06-14 LAB — FECAL OCCULT BLOOD, GUAIAC: OCCULT BLOOD: NEGATIVE

## 2015-06-14 NOTE — Patient Instructions (Signed)
Doing well. Follow up with Dr. Fermin Schwab as needed in the future.  Please call for any questions or concerns.

## 2015-06-14 NOTE — Progress Notes (Signed)
Consult Note: Gyn-Onc   Fredric Mare 81 y.o. female  Chief Complaint  Patient presents with  . 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 Clinically NED. Given that the patient is 12 years since her initial diagnosis we'll discharge her from our practice but would be available should she develop any gynecologic problems in the future.  Interval History:  The patient returns today as previously scheduled for routine followup of ovarian cancer. Since her last visit a year ago she's done well.   She denies any GI or GU symptoms. She has no abdominal bloating or pelvic pressure pain or bleeding. Functional status is good. She is up-to-date with mammograms and colonoscopy.   HPI: Stage IA  clear-cell carcinoma the ovary. The patient underwent initial surgical staging followed by 3 cycles of carboplatin and Taxol chemotherapy completed in November of 2004. She's done well with no evidence of recurrent disease.  Review of Systems:10 point review of systems is negative as noted above.   Vitals: Blood pressure 153/65, pulse 72, temperature 98.1 F (36.7 C), temperature source Oral, resp. rate 18, height _0  (1.575 m), weight 106 lb 1.6 oz (48.127 kg), SpO2 100 %.  Physical Exam: General : The patient is a healthy woman in no acute distress.  HEENT: normocephalic, extraoccular movements normal; neck is supple without thyromegally  Lynphnodes: Supraclavicular and inguinal nodes not enlarged  Abdomen: Soft, non-tender, no ascites, no organomegally, no masses, no hernias  Pelvic:  EGBUS: Normal female  Vagina: Normal, no lesions  Urethra and Bladder: Normal, non-tender  Cervix: Surgically absent  Uterus: Surgically absent  Bi-manual examination: Non-tender; no adenxal masses or nodularity  Rectal: normal sphincter tone, no masses, no blood  Lower extremities: No edema or varicosities. Normal range of motion     Allergies   Allergen Reactions  . Ibuprofen Nausea Only  . Morphine Nausea And Vomiting    Past Medical History  Diagnosis Date  . Hypertension   . Allergy   . Cataract   . CAD (coronary artery disease)   . Hyperlipidemia   . Cardiomyopathy     improved  . Arthritis   . Osteoporosis   . History of radiation therapy 1994    left chest wall  . History of chemotherapy   . Wears glasses   . Wears dentures     full bottom-partial top  . Cancer   . Ovarian ca   . Breast CA   . Eccrine porocarcinoma of skin     Past Surgical History  Procedure Laterality Date  . Left breast mastectomy  1994    T3 N1 Mx  . Shoulder arthroscopy    . Right leg  1990    fx  . Shoulder surgery  1990    replacement-lt  . Modified radical mastectomy w/ axillary lymph node dissection w/ pectoralis minor excision  1994    Left  . Eye surgery      CATARACTS  . Abdominal hysterectomy  2004  . Cholecystectomy  1993  . Breast surgery    . Skin biopsy  04/27/2011    left hand, forearm and posterior arm  . Mass excision Left 05/09/2014    Procedure: EXCISION OF NODULE LEFT CHEST ;  Surgeon: Pedro Earls, MD;  Location: [facility];  Service: General;  Laterality: Left;    Current Outpatient Prescriptions  Medication Sig Dispense Refill  . aspirin EC 81  MG tablet Take 81 mg by mouth at bedtime.     . Calcium Carbonate-Vitamin D (CALCIUM + D PO) Take 600 mg by mouth daily after lunch.     . carvedilol (COREG) 25 MG tablet TAKE ONE TABLET TWICE DAILY WITH FOOD 180 tablet 0  . Cholecalciferol (VITAMIN D) 2000 UNITS tablet Take 2,000 Units by mouth daily.    . Coenzyme Q10 (CO Q 10) 100 MG CAPS Take 1 capsule by mouth daily.    Marland Kitchen denosumab (PROLIA) 60 MG/ML SOLN injection Inject 60 mg into the skin every 6 (six) months. Administer in upper arm, thigh, or abdomen    . ferrous gluconate (FERGON) 324 MG tablet Take 324 mg by mouth once a week. Takes 2 tabs on empty stomach with OJ on Monday.    .  fluticasone (FLONASE) 50 MCG/ACT nasal spray Place 2 sprays into both nostrils daily. 16 g 6  . Iron TABS Take 1 tablet by mouth daily.    Marland Kitchen lisinopril (PRINIVIL,ZESTRIL) 20 MG tablet TAKE 1 TABLET AT BEDTIME. 90 tablet 1  . loperamide (IMODIUM) 2 MG capsule Take 1 capsule (2 mg total) by mouth as needed for diarrhea or loose stools. 20 capsule 0  . simvastatin (ZOCOR) 40 MG tablet TAKE ONE TABLET DAILY AT BEDTIME 90 tablet 1  . traMADol (ULTRAM) 50 MG tablet TAKE ONE TABLET EVERY 6 HOURS AS NEEDED FOR PAIN 60 tablet 2   No current facility-administered medications for this visit.    History   Social History  . Marital Status: Widowed    Spouse Name: N/A  . Number of Children: 1  . Years of Education: N/A   Occupational History  . Not on file.   Social History Main Topics  . Smoking status: Never Smoker   . Smokeless tobacco: Never Used  . Alcohol Use: No  . Drug Use: No  . Sexual Activity: Not on file     Comment: didn't ask   Other Topics Concern  . Not on file   Social History Narrative    Family History  Problem Relation Age of Onset  . Breast cancer Sister     dx in her 65s  . Heart disease Sister   . Heart disease Father   . Heart disease Mother   . Stroke Mother   . Prostate cancer Maternal Uncle   . Stroke Maternal Grandmother   . Testicular cancer Maternal Grandfather   . Stroke Maternal Grandfather   . Stroke Paternal Grandmother   . Heart disease Paternal Grandfather   . Breast cancer Sister 49    BRCA2+  . Breast cancer Sister 61  . Prostate cancer Maternal Uncle   . Breast cancer Other     maternal great aunt dx later in life      CLARKE-PEARSON,Eriq Hufford L, MD 06/14/2015, 1:35 PM

## 2015-06-14 NOTE — Telephone Encounter (Signed)
-----   Message from Gordy Levan, MD sent at 06/14/2015  2:14 PM EDT ----- Labs seen and need follow up: please let her know no blood in stools

## 2015-06-14 NOTE — Telephone Encounter (Signed)
Told Ms. Whidby the result of the Hemoccult Cards as noted below by Dr. Marko Plume.

## 2015-06-25 ENCOUNTER — Ambulatory Visit (HOSPITAL_BASED_OUTPATIENT_CLINIC_OR_DEPARTMENT_OTHER)
Admission: RE | Admit: 2015-06-25 | Discharge: 2015-06-25 | Disposition: A | Discharge status: Home / Self Care | Payer: Medicare HMO | Source: Ambulatory Visit | Attending: Oncology | Admitting: Oncology

## 2015-06-25 DIAGNOSIS — Z1231 Encounter for screening mammogram for malignant neoplasm of breast: Secondary | ICD-10-CM | POA: Diagnosis present

## 2015-07-10 ENCOUNTER — Encounter: Payer: Self-pay | Admitting: Internal Medicine

## 2015-07-10 ENCOUNTER — Ambulatory Visit (INDEPENDENT_AMBULATORY_CARE_PROVIDER_SITE_OTHER): Payer: Medicare HMO | Admitting: Internal Medicine

## 2015-07-10 VITALS — BP 120/62 | HR 66 | Temp 98.2°F | Resp 18 | Ht 62.0 in | Wt 107.0 lb

## 2015-07-10 DIAGNOSIS — E785 Hyperlipidemia, unspecified: Secondary | ICD-10-CM | POA: Diagnosis not present

## 2015-07-10 DIAGNOSIS — I10 Essential (primary) hypertension: Secondary | ICD-10-CM

## 2015-07-10 DIAGNOSIS — C4499 Other specified malignant neoplasm of skin, unspecified: Secondary | ICD-10-CM

## 2015-07-10 DIAGNOSIS — I2510 Atherosclerotic heart disease of native coronary artery without angina pectoris: Secondary | ICD-10-CM

## 2015-07-10 NOTE — Progress Notes (Signed)
Subjective:    Patient ID: Judy Herrera, female    DOB: 1931/07/12, 81 y.o.   MRN: [id number]  HPI 81 year old patient who is followed closely by oncology who has a complex oncological history.  She is doing quite well. She has essential hypertension and dyslipidemia.  She has a history of a congestive cardiomyopathy which has been stable.  Denies any cardiopulmonary complaints. She has a history of osteoporosis and remains on Prolia. Doing quite well today.  She has chronic left arm lymphedema which has been stable. She was noted by oncology have worsening anemia which is being followed closely.  Stool slides for occult blood.  Normal  Past Medical History  Diagnosis Date  . Hypertension   . Allergy   . Cataract   . CAD (coronary artery disease)   . Hyperlipidemia   . Cardiomyopathy     improved  . Arthritis   . Osteoporosis   . History of radiation therapy 1994    left chest wall  . History of chemotherapy   . Wears glasses   . Wears dentures     full bottom-partial top  . Cancer   . Ovarian ca   . Breast CA   . Eccrine porocarcinoma of skin     Social History   Social History  . Marital Status: Widowed    Spouse Name: N/A  . Number of Children: 1  . Years of Education: N/A   Occupational History  . Not on file.   Social History Main Topics  . Smoking status: Never Smoker   . Smokeless tobacco: Never Used  . Alcohol Use: No  . Drug Use: No  . Sexual Activity: Not on file     Comment: didn't ask   Other Topics Concern  . Not on file   Social History Narrative    Past Surgical History  Procedure Laterality Date  . Left breast mastectomy  1994    T3 N1 Mx  . Shoulder arthroscopy    . Right leg  1990    fx  . Shoulder surgery  1990    replacement-lt  . Modified radical mastectomy w/ axillary lymph node dissection w/ pectoralis minor excision  1994    Left  . Eye surgery      CATARACTS  . Abdominal hysterectomy  2004  . Cholecystectomy   1993  . Breast surgery    . Skin biopsy  04/27/2011    left hand, forearm and posterior arm  . Mass excision Left 05/09/2014    Procedure: EXCISION OF NODULE LEFT CHEST ;  Surgeon: Pedro Earls, MD;  Location: [facility];  Service: General;  Laterality: Left;    Family History  Problem Relation Age of Onset  . Breast cancer Sister     dx in her 49s  . Heart disease Sister   . Heart disease Father   . Heart disease Mother   . Stroke Mother   . Prostate cancer Maternal Uncle   . Stroke Maternal Grandmother   . Testicular cancer Maternal Grandfather   . Stroke Maternal Grandfather   . Stroke Paternal Grandmother   . Heart disease Paternal Grandfather   . Breast cancer Sister 24    BRCA2+  . Breast cancer Sister 74  . Prostate cancer Maternal Uncle   . Breast cancer Other     maternal great aunt dx later in life    Allergies  Allergen Reactions  . Ibuprofen Nausea Only  .  Morphine Nausea And Vomiting    Current Outpatient Prescriptions on File Prior to Visit  Medication Sig Dispense Refill  . aspirin EC 81 MG tablet Take 81 mg by mouth at bedtime.     . Calcium Carbonate-Vitamin D (CALCIUM + D PO) Take 600 mg by mouth daily after lunch.     . carvedilol (COREG) 25 MG tablet TAKE ONE TABLET TWICE DAILY WITH FOOD 180 tablet 0  . Cholecalciferol (VITAMIN D) 2000 UNITS tablet Take 2,000 Units by mouth daily.    . Coenzyme Q10 (CO Q 10) 100 MG CAPS Take 1 capsule by mouth daily.    Marland Kitchen denosumab (PROLIA) 60 MG/ML SOLN injection Inject 60 mg into the skin every 6 (six) months. Administer in upper arm, thigh, or abdomen    . ferrous gluconate (FERGON) 324 MG tablet Take 324 mg by mouth once a week. Takes 2 tabs on empty stomach with OJ on Monday.    . fluticasone (FLONASE) 50 MCG/ACT nasal spray Place 2 sprays into both nostrils daily. 16 g 6  . lisinopril (PRINIVIL,ZESTRIL) 20 MG tablet TAKE 1 TABLET AT BEDTIME. 90 tablet 1  . loperamide (IMODIUM) 2 MG capsule Take  1 capsule (2 mg total) by mouth as needed for diarrhea or loose stools. 20 capsule 0  . simvastatin (ZOCOR) 40 MG tablet TAKE ONE TABLET DAILY AT BEDTIME 90 tablet 1  . traMADol (ULTRAM) 50 MG tablet TAKE ONE TABLET EVERY 6 HOURS AS NEEDED FOR PAIN 60 tablet 2   No current facility-administered medications on file prior to visit.    BP 120/62 mmHg  Pulse 66  Temp(Src) 98.2 F (36.8 C) (Oral)  Resp 18  Ht _0  (1.575 m)  Wt 107 lb (48.535 kg)  BMI 19.57 kg/m2  SpO2 96%      Review of Systems  Constitutional: Negative.   HENT: Negative for congestion, dental problem, hearing loss, rhinorrhea, sinus pressure, sore throat and tinnitus.   Eyes: Negative for pain, discharge and visual disturbance.  Respiratory: Negative for cough and shortness of breath.   Cardiovascular: Negative for chest pain, palpitations and leg swelling.  Gastrointestinal: Negative for nausea, vomiting, abdominal pain, diarrhea, constipation, blood in stool and abdominal distention.  Genitourinary: Negative for dysuria, urgency, frequency, hematuria, flank pain, vaginal bleeding, vaginal discharge, difficulty urinating, vaginal pain and pelvic pain.  Musculoskeletal: Negative for joint swelling, arthralgias and gait problem.  Skin: Positive for wound. Negative for rash.  Neurological: Negative for dizziness, syncope, speech difficulty, weakness, numbness and headaches.  Hematological: Negative for adenopathy.  Psychiatric/Behavioral: Negative for behavioral problems, dysphoric mood and agitation. The patient is not nervous/anxious.        Objective:   Physical Exam  Constitutional: She is oriented to person, place, and time. She appears well-developed and well-nourished.  Blood pressure low normal  HENT:  Head: Normocephalic.  Right Ear: External ear normal.  Left Ear: External ear normal.  Mouth/Throat: Oropharynx is clear and moist.  Eyes: Conjunctivae and EOM are normal. Pupils are equal, round, and  reactive to light.  Neck: Normal range of motion. Neck supple. No thyromegaly present.  Cardiovascular: Normal rate, regular rhythm, normal heart sounds and intact distal pulses.   Pulmonary/Chest: Effort normal and breath sounds normal. No respiratory distress. She has no wheezes.  Abdominal: Soft. Bowel sounds are normal. She exhibits no mass. There is no tenderness.  Musculoskeletal: Normal range of motion. She exhibits no edema.  Lymphadenopathy:    She has no cervical adenopathy.  Neurological: She is alert and oriented to person, place, and time.  Skin: Skin is warm and dry. No rash noted.  Scattered erythematous, keratotic lesions of the porocarcinoma left forearm.  Nodular lesion, left proximal forearm.  Surgically removed at [facility]  Psychiatric: She has a normal mood and affect. Her behavior is normal.          Assessment & Plan:   Hypertension, well-controlled Congestive heart failure, stable Dyslipidemia.  Continue statin therapy Coronary artery disease, stable Anemia.  Follow-up oncology.  Patient has resumed iron therapy Osteoporosis.  Continue calcium and vitamin D supplementation as well as prolia.  Return here in 6 months or as needed

## 2015-07-10 NOTE — Patient Instructions (Signed)
Limit your sodium (Salt) intake Take a calcium supplement, plus 800-1200 units of vitamin D  Return in 6 months for follow-up  

## 2015-07-10 NOTE — Progress Notes (Signed)
Pre visit review using our clinic review tool, if applicable. No additional management support is needed unless otherwise documented below in the visit note. 

## 2015-07-22 ENCOUNTER — Other Ambulatory Visit: Payer: Self-pay | Admitting: Oncology

## 2015-07-29 ENCOUNTER — Encounter: Payer: Self-pay | Admitting: Oncology

## 2015-07-29 ENCOUNTER — Telehealth: Payer: Self-pay | Admitting: Oncology

## 2015-07-29 ENCOUNTER — Ambulatory Visit (HOSPITAL_BASED_OUTPATIENT_CLINIC_OR_DEPARTMENT_OTHER): Payer: Medicare HMO | Admitting: Oncology

## 2015-07-29 ENCOUNTER — Other Ambulatory Visit (HOSPITAL_BASED_OUTPATIENT_CLINIC_OR_DEPARTMENT_OTHER): Payer: Medicare HMO

## 2015-07-29 ENCOUNTER — Ambulatory Visit (HOSPITAL_BASED_OUTPATIENT_CLINIC_OR_DEPARTMENT_OTHER): Payer: Medicare HMO

## 2015-07-29 VITALS — BP 133/62 | HR 70 | Temp 98.3°F | Resp 18 | Ht 62.0 in | Wt 107.9 lb

## 2015-07-29 DIAGNOSIS — E8989 Other postprocedural endocrine and metabolic complications and disorders: Secondary | ICD-10-CM

## 2015-07-29 DIAGNOSIS — C44699 Other specified malignant neoplasm of skin of left upper limb, including shoulder: Secondary | ICD-10-CM

## 2015-07-29 DIAGNOSIS — C569 Malignant neoplasm of unspecified ovary: Secondary | ICD-10-CM | POA: Diagnosis not present

## 2015-07-29 DIAGNOSIS — Z853 Personal history of malignant neoplasm of breast: Secondary | ICD-10-CM

## 2015-07-29 DIAGNOSIS — Z9889 Other specified postprocedural states: Secondary | ICD-10-CM

## 2015-07-29 DIAGNOSIS — D649 Anemia, unspecified: Secondary | ICD-10-CM

## 2015-07-29 DIAGNOSIS — C4499 Other specified malignant neoplasm of skin, unspecified: Secondary | ICD-10-CM

## 2015-07-29 DIAGNOSIS — I890 Lymphedema, not elsewhere classified: Secondary | ICD-10-CM

## 2015-07-29 DIAGNOSIS — Z95828 Presence of other vascular implants and grafts: Secondary | ICD-10-CM

## 2015-07-29 LAB — CBC WITH DIFFERENTIAL/PLATELET
BASO%: 0.8 % (ref 0.0–2.0)
Basophils Absolute: 0 10*3/uL (ref 0.0–0.1)
EOS%: 2.8 % (ref 0.0–7.0)
Eosinophils Absolute: 0.1 10*3/uL (ref 0.0–0.5)
HCT: 31.1 % — ABNORMAL LOW (ref 34.8–46.6)
HGB: 10.4 g/dL — ABNORMAL LOW (ref 11.6–15.9)
LYMPH%: 24.6 % (ref 14.0–49.7)
MCH: 32 pg (ref 25.1–34.0)
MCHC: 33.4 g/dL (ref 31.5–36.0)
MCV: 95.7 fL (ref 79.5–101.0)
MONO#: 0.3 10*3/uL (ref 0.1–0.9)
MONO%: 8.5 % (ref 0.0–14.0)
NEUT%: 63.3 % (ref 38.4–76.8)
NEUTROS ABS: 2.4 10*3/uL (ref 1.5–6.5)
PLATELETS: 134 10*3/uL — AB (ref 145–400)
RBC: 3.25 10*6/uL — AB (ref 3.70–5.45)
RDW: 13 % (ref 11.2–14.5)
WBC: 3.9 10*3/uL (ref 3.9–10.3)
lymph#: 1 10*3/uL (ref 0.9–3.3)

## 2015-07-29 MED ORDER — HEPARIN SOD (PORK) LOCK FLUSH 100 UNIT/ML IV SOLN
500.0000 [IU] | Freq: Once | INTRAVENOUS | Status: AC
  Administered 2015-07-29: 500 [IU] via INTRAVENOUS
  Filled 2015-07-29: qty 5

## 2015-07-29 MED ORDER — SODIUM CHLORIDE 0.9 % IJ SOLN
10.0000 mL | INTRAMUSCULAR | Status: DC | PRN
  Administered 2015-07-29: 10 mL via INTRAVENOUS
  Filled 2015-07-29: qty 10

## 2015-07-29 NOTE — Telephone Encounter (Signed)
Appointments already made and avs printed for patient
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
## 2015-07-29 NOTE — Patient Instructions (Signed)

## 2015-08-01 ENCOUNTER — Encounter: Payer: Self-pay | Admitting: Internal Medicine

## 2015-08-01 ENCOUNTER — Ambulatory Visit (INDEPENDENT_AMBULATORY_CARE_PROVIDER_SITE_OTHER): Payer: Medicare HMO | Admitting: Internal Medicine

## 2015-08-01 VITALS — BP 150/70 | HR 69 | Temp 98.4°F | Resp 18 | Ht 62.0 in | Wt 106.0 lb

## 2015-08-01 DIAGNOSIS — H8110 Benign paroxysmal vertigo, unspecified ear: Secondary | ICD-10-CM

## 2015-08-01 DIAGNOSIS — I420 Dilated cardiomyopathy: Secondary | ICD-10-CM

## 2015-08-01 DIAGNOSIS — I2510 Atherosclerotic heart disease of native coronary artery without angina pectoris: Secondary | ICD-10-CM

## 2015-08-01 NOTE — Progress Notes (Signed)
Subjective:    Patient ID: Judy Herrera, female    DOB: 1931/06/11, 81 y.o.   MRN: [id number]  HPI  81 year old patient with multiple problems including congestive heart failure, essential hypertension and history of syncope. She was stable until last night when after bedtime became slightly dizzy.  She felt very unsteady when she and related to the bathroom.  Symptoms worsened by movement and alleviated by rest.  She did describe a episode of sense of spinning briefly Since she woke this morning.  She has had no recurrent dizziness.  She has been concern about her blood pressure that was as high as 180 over 90 2 in the morning.  She took her maintenance medications.  In addition, she took an additional 2.5 milligrams amlodipine.  By 9:15, blood pressure down to 125 over 50.  Presently feels well   Past Medical History  Diagnosis Date  . Hypertension   . Allergy   . Cataract   . CAD (coronary artery disease)   . Hyperlipidemia   . Cardiomyopathy     improved  . Arthritis   . Osteoporosis   . History of radiation therapy 1994    left chest wall  . History of chemotherapy   . Wears glasses   . Wears dentures     full bottom-partial top  . Cancer   . Ovarian ca   . Breast CA   . Eccrine porocarcinoma of skin     Social History   Social History  . Marital Status: Widowed    Spouse Name: N/A  . Number of Children: 1  . Years of Education: N/A   Occupational History  . Not on file.   Social History Main Topics  . Smoking status: Never Smoker   . Smokeless tobacco: Never Used  . Alcohol Use: No  . Drug Use: No  . Sexual Activity: Not on file     Comment: didn't ask   Other Topics Concern  . Not on file   Social History Narrative    Past Surgical History  Procedure Laterality Date  . Left breast mastectomy  1994    T3 N1 Mx  . Shoulder arthroscopy    . Right leg  1990    fx  . Shoulder surgery  1990    replacement-lt  . Modified radical mastectomy w/  axillary lymph node dissection w/ pectoralis minor excision  1994    Left  . Eye surgery      CATARACTS  . Abdominal hysterectomy  2004  . Cholecystectomy  1993  . Breast surgery    . Skin biopsy  04/27/2011    left hand, forearm and posterior arm  . Mass excision Left 05/09/2014    Procedure: EXCISION OF NODULE LEFT CHEST ;  Surgeon: Pedro Earls, MD;  Location: [facility];  Service: General;  Laterality: Left;    Family History  Problem Relation Age of Onset  . Breast cancer Sister     dx in her 13s  . Heart disease Sister   . Heart disease Father   . Heart disease Mother   . Stroke Mother   . Prostate cancer Maternal Uncle   . Stroke Maternal Grandmother   . Testicular cancer Maternal Grandfather   . Stroke Maternal Grandfather   . Stroke Paternal Grandmother   . Heart disease Paternal Grandfather   . Breast cancer Sister 63    BRCA2+  . Breast cancer Sister 73  .  Prostate cancer Maternal Uncle   . Breast cancer Other     maternal great aunt dx later in life    Allergies  Allergen Reactions  . Ibuprofen Nausea Only  . Morphine Nausea And Vomiting    Current Outpatient Prescriptions on File Prior to Visit  Medication Sig Dispense Refill  . aspirin EC 81 MG tablet Take 81 mg by mouth at bedtime.     . Calcium Carbonate-Vitamin D (CALCIUM + D PO) Take 600 mg by mouth daily after lunch.     . carvedilol (COREG) 25 MG tablet TAKE ONE TABLET TWICE DAILY WITH FOOD 180 tablet 0  . Cholecalciferol (VITAMIN D) 2000 UNITS tablet Take 2,000 Units by mouth daily.    . Coenzyme Q10 (CO Q 10) 100 MG CAPS Take 1 capsule by mouth daily.    Marland Kitchen denosumab (PROLIA) 60 MG/ML SOLN injection Inject 60 mg into the skin every 6 (six) months. Administer in upper arm, thigh, or abdomen    . ferrous gluconate (FERGON) 324 MG tablet Take 324 mg by mouth once a week. Takes 2 tabs on empty stomach with OJ on Monday.    . fluticasone (FLONASE) 50 MCG/ACT nasal spray Place 2 sprays  into both nostrils daily. 16 g 6  . lisinopril (PRINIVIL,ZESTRIL) 20 MG tablet TAKE 1 TABLET AT BEDTIME. 90 tablet 1  . loperamide (IMODIUM) 2 MG capsule Take 1 capsule (2 mg total) by mouth as needed for diarrhea or loose stools. 20 capsule 0  . simvastatin (ZOCOR) 40 MG tablet TAKE ONE TABLET DAILY AT BEDTIME 90 tablet 1  . traMADol (ULTRAM) 50 MG tablet TAKE ONE TABLET EVERY 6 HOURS AS NEEDED FOR PAIN 60 tablet 2   No current facility-administered medications on file prior to visit.    BP 150/70 mmHg  Pulse 69  Temp(Src) 98.4 F (36.9 C) (Oral)  Resp 18  Ht _0  (1.575 m)  Wt 106 lb (48.081 kg)  BMI 19.38 kg/m2  SpO2 97%    Review of Systems  Constitutional: Negative.   HENT: Negative for congestion, dental problem, hearing loss, rhinorrhea, sinus pressure, sore throat and tinnitus.   Eyes: Negative for pain, discharge and visual disturbance.  Respiratory: Negative for cough and shortness of breath.   Cardiovascular: Negative for chest pain, palpitations and leg swelling.  Gastrointestinal: Negative for nausea, vomiting, abdominal pain, diarrhea, constipation, blood in stool and abdominal distention.  Genitourinary: Negative for dysuria, urgency, frequency, hematuria, flank pain, vaginal bleeding, vaginal discharge, difficulty urinating, vaginal pain and pelvic pain.  Musculoskeletal: Negative for joint swelling, arthralgias and gait problem.  Skin: Negative for rash.  Neurological: Positive for dizziness and light-headedness. Negative for syncope, speech difficulty, weakness, numbness and headaches.  Hematological: Negative for adenopathy.  Psychiatric/Behavioral: Negative for behavioral problems, dysphoric mood and agitation. The patient is not nervous/anxious.        Objective:   Physical Exam  Constitutional: She is oriented to person, place, and time. She appears well-developed and well-nourished.  Blood pressure 140/70  HENT:  Head: Normocephalic.  Right Ear:  External ear normal.  Left Ear: External ear normal.  Mouth/Throat: Oropharynx is clear and moist.  Eyes: Conjunctivae and EOM are normal. Pupils are equal, round, and reactive to light.  Neck: Normal range of motion. Neck supple. No thyromegaly present.  Cardiovascular: Normal rate, regular rhythm, normal heart sounds and intact distal pulses.   Pulmonary/Chest: Effort normal and breath sounds normal.  Abdominal: Soft. Bowel sounds are normal. She exhibits no  mass. There is no tenderness.  Musculoskeletal: Normal range of motion.  Lymphadenopathy:    She has no cervical adenopathy.  Neurological: She is alert and oriented to person, place, and time. No cranial nerve deficit.  Cranial nerve exam normal No drift Gait.  Finger to nose testing normal  Skin: Skin is warm and dry. No rash noted.  Psychiatric: She has a normal mood and affect. Her behavior is normal.          Assessment & Plan:   Episode of mild positional vertigo.  Hopefully this has already resolved Hypertension.  Will continue to observe on lisinopril.  Coreg.  Combination low-salt diet recommended History of coronary artery disease  Recheck 4 weeks

## 2015-08-01 NOTE — Progress Notes (Signed)
Pre visit review using our clinic review tool, if applicable. No additional management support is needed unless otherwise documented below in the visit note. 

## 2015-08-01 NOTE — Patient Instructions (Signed)
Limit your sodium (Salt) intake  Please check your blood pressure on a regular basis.  If it is consistently greater than 150/90, please make an office appointment.  Return in one month for follow-up  

## 2015-08-02 ENCOUNTER — Telehealth: Payer: Self-pay | Admitting: Oncology

## 2015-08-02 NOTE — Telephone Encounter (Signed)
s.w. pt and advised on OCT MD visit cx...only DEC appt on sched....pt ok and aware

## 2015-08-06 ENCOUNTER — Other Ambulatory Visit: Payer: Self-pay | Admitting: Radiology

## 2015-08-06 ENCOUNTER — Encounter: Payer: Self-pay | Admitting: Emergency Medicine

## 2015-08-06 ENCOUNTER — Emergency Department
Admission: EM | Admit: 2015-08-06 | Discharge: 2015-08-06 | Disposition: A | Discharge status: Home / Self Care | Payer: Medicare HMO | Source: Home / Self Care | Attending: Family Medicine | Admitting: Family Medicine

## 2015-08-06 DIAGNOSIS — R300 Dysuria: Secondary | ICD-10-CM

## 2015-08-06 DIAGNOSIS — N390 Urinary tract infection, site not specified: Secondary | ICD-10-CM

## 2015-08-06 LAB — POCT URINALYSIS DIP (MANUAL ENTRY)
Glucose, UA: 100 — AB
Nitrite, UA: POSITIVE — AB
Protein Ur, POC: 30 — AB
Spec Grav, UA: 1.015 (ref 1.005–1.03)
Urobilinogen, UA: 2 (ref 0–1)
pH, UA: 6 (ref 5–8)

## 2015-08-06 MED ORDER — CEPHALEXIN 500 MG PO CAPS: 500.0000 mg | ORAL_CAPSULE | Freq: Two times a day (BID) | ORAL | Status: DC

## 2015-08-06 NOTE — ED Notes (Signed)
Dysuria x 3 days, took azo Sunday and last night

## 2015-08-06 NOTE — ED Provider Notes (Signed)
CSN: [id number]     Arrival date & time 08/06/15  0829 History   None    Chief Complaint  Patient presents with  . Dysuria   (Consider location/radiation/quality/duration/timing/severity/associated sxs/prior Treatment) HPI  Pt is an 81yo female with hx of recurrent UTIs, presenting to [facility] with c/o dysuria and lower abdominal pain that started 3 days ago. Symptoms are gradually worsening. Lower abdominal pain is aching and sore, worse during and after urination. Denies back pain, fever, chills, n/v/d. She has tried Azo with minimal relief.  Last UTI was several months ago.  Past Medical History  Diagnosis Date  . Hypertension   . Allergy   . Cataract   . CAD (coronary artery disease)   . Hyperlipidemia   . Cardiomyopathy     improved  . Arthritis   . Osteoporosis   . History of radiation therapy 1994    left chest wall  . History of chemotherapy   . Wears glasses   . Wears dentures     full bottom-partial top  . Cancer   . Ovarian ca   . Breast CA   . Eccrine porocarcinoma of skin    Past Surgical History  Procedure Laterality Date  . Left breast mastectomy  1994    T3 N1 Mx  . Shoulder arthroscopy    . Right leg  1990    fx  . Shoulder surgery  1990    replacement-lt  . Modified radical mastectomy w/ axillary lymph node dissection w/ pectoralis minor excision  1994    Left  . Eye surgery      CATARACTS  . Abdominal hysterectomy  2004  . Cholecystectomy  1993  . Breast surgery    . Skin biopsy  04/27/2011    left hand, forearm and posterior arm  . Mass excision Left 05/09/2014    Procedure: EXCISION OF NODULE LEFT CHEST ;  Surgeon: Matthew B Martin, MD;  Location: [facility];  Service: General;  Laterality: Left;   Family History  Problem Relation Age of Onset  . Breast cancer Sister     dx in her 60s  . Heart disease Sister   . Heart disease Father   . Heart disease Mother   . Stroke Mother   . Prostate cancer Maternal Uncle   . Stroke  Maternal Grandmother   . Testicular cancer Maternal Grandfather   . Stroke Maternal Grandfather   . Stroke Paternal Grandmother   . Heart disease Paternal Grandfather   . Breast cancer Sister 70    BRCA2+  . Breast cancer Sister 61  . Prostate cancer Maternal Uncle   . Breast cancer Other     maternal great aunt dx later in life   Social History  Substance Use Topics  . Smoking status: Never Smoker   . Smokeless tobacco: Never Used  . Alcohol Use: No   OB History    No data available     Review of Systems  Constitutional: Negative for fever and chills.  Gastrointestinal: Positive for abdominal pain ( lower abdomen). Negative for nausea, vomiting and diarrhea.  Genitourinary: Positive for dysuria and frequency. Negative for urgency, hematuria and flank pain.  Musculoskeletal: Negative for myalgias and back pain.    Allergies  Ibuprofen and Morphine  Home Medications   Prior to Admission medications   Medication Sig Start Date End Date Taking? Authorizing   aspirin EC 81 MG tablet Take 81 mg by mouth at bedtime.       Historical Jeana Kersting, MD  Calcium Carbonate-Vitamin D (CALCIUM + D PO) Take 600 mg by mouth daily after lunch.     Historical Amara Justen, MD  carvedilol (COREG) 25 MG tablet TAKE ONE TABLET TWICE DAILY WITH FOOD 03/07/15   Marletta Lor, MD  cephALEXin (KEFLEX) 500 MG capsule Take 1 capsule (500 mg total) by mouth 2 (two) times daily. For 7 days 08/06/15   Noland Fordyce, PA-C  Cholecalciferol (VITAMIN D) 2000 UNITS tablet Take 2,000 Units by mouth daily.    Historical Day Greb, MD  Coenzyme Q10 (CO Q 10) 100 MG CAPS Take 1 capsule by mouth daily.    Historical Mulan Adan, MD  denosumab (PROLIA) 60 MG/ML SOLN injection Inject 60 mg into the skin every 6 (six) months. Administer in upper arm, thigh, or abdomen    Historical Khaliah Barnick, MD  ferrous gluconate (FERGON) 324 MG tablet Take 324 mg by mouth daily with breakfast.  04/01/12   Lennis Marion Downer, MD   fluticasone (FLONASE) 50 MCG/ACT nasal spray Place 2 sprays into both nostrils daily. 10/02/14   Marletta Lor, MD  lisinopril (PRINIVIL,ZESTRIL) 20 MG tablet TAKE 1 TABLET AT BEDTIME. 06/07/15   Marletta Lor, MD  loperamide (IMODIUM) 2 MG capsule Take 1 capsule (2 mg total) by mouth as needed for diarrhea or loose stools. 01/24/13   Lennis Marion Downer, MD  simvastatin (ZOCOR) 40 MG tablet TAKE ONE TABLET DAILY AT BEDTIME 01/24/15   Marletta Lor, MD  traMADol Veatrice Bourbon) 50 MG tablet TAKE ONE TABLET EVERY 6 HOURS AS NEEDED FOR PAIN 01/24/15   Marletta Lor, MD   Meds Ordered and Administered this Visit  Medications - No data to display  BP 131/71 mmHg  Pulse 84  Temp(Src) 98 F (36.7 C) (Oral)  Ht 5' 2" (1.575 m)  Wt 107 lb (48.535 kg)  BMI 19.57 kg/m2  SpO2 99% No data found.   Physical Exam  Constitutional: She appears well-developed and well-nourished. No distress.  HENT:  Head: Normocephalic and atraumatic.  Eyes: Conjunctivae are normal. No scleral icterus.  Neck: Normal range of motion. Neck supple.  Cardiovascular: Normal rate, regular rhythm and normal heart sounds.   Pulmonary/Chest: Effort normal and breath sounds normal. No respiratory distress. She has no wheezes. She has no rales. She exhibits no tenderness.  Abdominal: Soft. She exhibits no distension and no mass. There is no tenderness. There is no rebound, no guarding and no CVA tenderness.  Soft, non-distended, mild tenderness to lower abdomen. No rebound, guarding or masses. No CVAT  Musculoskeletal: Normal range of motion.  Neurological: She is alert.  Skin: Skin is warm and dry. She is not diaphoretic.  Nursing note and vitals reviewed.   ED Course  Procedures (including critical care time)  Labs Review Labs Reviewed  POCT URINALYSIS DIP (MANUAL ENTRY) - Abnormal; Notable for the following:    Color, UA orange (*)    Glucose, UA =100 (*)    Bilirubin, UA small (*)    Ketones, POC UA  trace (5) (*)    Blood, UA trace-intact (*)    Protein Ur, POC =30 (*)    Nitrite, UA Positive (*)    Leukocytes, UA Trace (*)    All other components within normal limits  URINE CULTURE    Imaging Review No results found.    MDM   1. Dysuria   2. UTI (lower urinary tract infection)    Pt is an 81yo female presenting to [facility] with c/o  dysuria. Hx of UTIs. Last one was several months ago. Pt appears well, non-toxic, afebrile. No CVAT UA: positive for nitrites. Rx: Keflex 500mg BID x 7 days. Encouraged to stay well hydrated. F/u with PCP in 4-5 days for recheck of symptoms if not improving, sooner if worsening. Patient verbalized understanding and agreement with treatment plan.     Erin O'Malley, PA-C 08/06/15 0859 

## 2015-08-07 ENCOUNTER — Other Ambulatory Visit: Payer: Self-pay | Admitting: Radiology

## 2015-08-07 ENCOUNTER — Ambulatory Visit (HOSPITAL_COMMUNITY)
Admission: RE | Admit: 2015-08-07 | Discharge: 2015-08-07 | Disposition: A | Discharge status: Home / Self Care | Payer: Medicare HMO | Source: Ambulatory Visit | Attending: Oncology | Admitting: Oncology

## 2015-08-07 ENCOUNTER — Encounter (HOSPITAL_COMMUNITY): Payer: Self-pay

## 2015-08-07 DIAGNOSIS — M810 Age-related osteoporosis without current pathological fracture: Secondary | ICD-10-CM | POA: Diagnosis not present

## 2015-08-07 DIAGNOSIS — Z923 Personal history of irradiation: Secondary | ICD-10-CM | POA: Insufficient documentation

## 2015-08-07 DIAGNOSIS — I10 Essential (primary) hypertension: Secondary | ICD-10-CM | POA: Diagnosis not present

## 2015-08-07 DIAGNOSIS — E785 Hyperlipidemia, unspecified: Secondary | ICD-10-CM | POA: Diagnosis not present

## 2015-08-07 DIAGNOSIS — I429 Cardiomyopathy, unspecified: Secondary | ICD-10-CM | POA: Insufficient documentation

## 2015-08-07 DIAGNOSIS — Z9221 Personal history of antineoplastic chemotherapy: Secondary | ICD-10-CM | POA: Diagnosis not present

## 2015-08-07 DIAGNOSIS — Z8543 Personal history of malignant neoplasm of ovary: Secondary | ICD-10-CM | POA: Insufficient documentation

## 2015-08-07 DIAGNOSIS — Z452 Encounter for adjustment and management of vascular access device: Secondary | ICD-10-CM | POA: Diagnosis not present

## 2015-08-07 DIAGNOSIS — Z7982 Long term (current) use of aspirin: Secondary | ICD-10-CM | POA: Insufficient documentation

## 2015-08-07 DIAGNOSIS — Z85828 Personal history of other malignant neoplasm of skin: Secondary | ICD-10-CM | POA: Diagnosis not present

## 2015-08-07 DIAGNOSIS — C4499 Other specified malignant neoplasm of skin, unspecified: Secondary | ICD-10-CM

## 2015-08-07 DIAGNOSIS — I2510 Atherosclerotic heart disease of native coronary artery without angina pectoris: Secondary | ICD-10-CM | POA: Diagnosis not present

## 2015-08-07 DIAGNOSIS — M1990 Unspecified osteoarthritis, unspecified site: Secondary | ICD-10-CM | POA: Insufficient documentation

## 2015-08-07 DIAGNOSIS — Z853 Personal history of malignant neoplasm of breast: Secondary | ICD-10-CM | POA: Insufficient documentation

## 2015-08-07 LAB — CBC WITH DIFFERENTIAL/PLATELET
Basophils Absolute: 0 10*3/uL (ref 0.0–0.1)
Basophils Relative: 0 %
Eosinophils Absolute: 0.2 10*3/uL (ref 0.0–0.7)
Eosinophils Relative: 3 %
HEMATOCRIT: 34.4 % — AB (ref 36.0–46.0)
HEMOGLOBIN: 11.5 g/dL — AB (ref 12.0–15.0)
LYMPHS ABS: 0.7 10*3/uL (ref 0.7–4.0)
Lymphocytes Relative: 14 %
MCH: 32.3 pg (ref 26.0–34.0)
MCHC: 33.4 g/dL (ref 30.0–36.0)
MCV: 96.6 fL (ref 78.0–100.0)
MONOS PCT: 8 %
Monocytes Absolute: 0.4 10*3/uL (ref 0.1–1.0)
NEUTROS ABS: 3.4 10*3/uL (ref 1.7–7.7)
NEUTROS PCT: 75 %
Platelets: 137 10*3/uL — ABNORMAL LOW (ref 150–400)
RBC: 3.56 MIL/uL — ABNORMAL LOW (ref 3.87–5.11)
RDW: 13.2 % (ref 11.5–15.5)
WBC: 4.6 10*3/uL (ref 4.0–10.5)

## 2015-08-07 LAB — PROTIME-INR
INR: 0.99 (ref 0.00–1.49)
Prothrombin Time: 13.3 seconds (ref 11.6–15.2)

## 2015-08-07 MED ORDER — CEFAZOLIN SODIUM-DEXTROSE 2-3 GM-% IV SOLR
INTRAVENOUS | Status: AC
  Filled 2015-08-07: qty 50

## 2015-08-07 MED ORDER — CEFAZOLIN SODIUM-DEXTROSE 2-3 GM-% IV SOLR
2.0000 g | INTRAVENOUS | Status: AC
  Administered 2015-08-07: 2 g via INTRAVENOUS

## 2015-08-07 MED ORDER — SODIUM CHLORIDE 0.9 % IV SOLN
INTRAVENOUS | Status: DC
  Administered 2015-08-07: 08:00:00 via INTRAVENOUS

## 2015-08-07 MED ORDER — LIDOCAINE HCL 1 % IJ SOLN
INTRAMUSCULAR | Status: AC
  Filled 2015-08-07: qty 20

## 2015-08-07 NOTE — Procedures (Signed)
Interventional Radiology Procedure Note  Procedure: Removal of a right IJ approach single lumen PowerPort.   Complications: None Recommendations:  - Ok to shower tomorrow - Do not submerge for 7 days - Routine care   Signed,  Jaime S. Wagner, DO    

## 2015-08-07 NOTE — Discharge Instructions (Signed)
Incision Care °An incision is when a surgeon cuts into your body tissues. After surgery, the incision needs to be cared for properly to prevent infection.  °HOME CARE INSTRUCTIONS  °· Take all medicine as directed by your caregiver. Only take over-the-counter or prescription medicines for pain, discomfort, or fever as directed by your caregiver. °· Do not remove your bandage (dressing) or get your incision wet until your surgeon gives you permission. In the event that your dressing becomes wet, dirty, or starts to smell, change the dressing and call your surgeon for instructions as soon as possible. °· Take showers. Do not take tub baths, swim, or do anything that may soak the wound until it is healed. °· Resume your normal diet and activities as directed or allowed. °· Avoid lifting any weight until you are instructed otherwise. °· Use anti-itch antihistamine medicine as directed by your caregiver. The wound may itch when it is healing. Do not pick or scratch at the wound. °· Follow up with your caregiver for stitch (suture) or staple removal as directed. °· Drink enough fluids to keep your urine clear or pale yellow. °SEEK MEDICAL CARE IF:  °· You have redness, swelling, or increasing pain in the wound that is not controlled with medicine. °· You have drainage, blood, or pus coming from the wound that lasts longer than 1 day. °· You develop muscle aches, chills, or a general ill feeling. °· You notice a bad smell coming from the wound or dressing. °· Your wound edges separate after the sutures, staples, or skin adhesive strips have been removed. °· You develop persistent nausea or vomiting. °SEEK IMMEDIATE MEDICAL CARE IF:  °· You have a fever. °· You develop a rash. °· You develop dizzy episodes or faint while standing. °· You have difficulty breathing. °· You develop any reaction or side effects to medicine given. °MAKE SURE YOU:  °· Understand these instructions. °· Will watch your condition. °· Will get help  right away if you are not doing well or get worse. °Document Released: 05/22/2005 Document Revised: 01/25/2012 Document Reviewed: 12/27/2013 °ExitCare® Patient Information ©2015 [employer]. This information is not intended to replace advice given to you by your health care provider. Make sure you discuss any questions you have with your health care provider. ° ° °

## 2015-08-07 NOTE — H&P (Signed)
Chief Complaint: Patient was seen in consultation today for port a cath removal at the request of Livesay,Lennis P  Referring Physician(s): Livesay,Lennis P  History of Present Illness: Judy Herrera is a 81 y.o. female   Hx Breast Ca 1994 Hx Ov Ca 2004 Porocarcinoma - LUE since 2014 PAC was placed in IR 12/2012 for chemo treatment of porocarcinoma Pt had bad reaction to chemo and treatment was stopped after one treatment Never used again except for serial heparin flushes Last flush 2-3 weeks ago Now scheduled for removal per Dr Marko Plume  Pt being treated now for UTI dx 08/06/15: Keflex 500 mg BID   Past Medical History  Diagnosis Date  . Hypertension   . Allergy   . Cataract   . CAD (coronary artery disease)   . Hyperlipidemia   . Cardiomyopathy     improved  . Arthritis   . Osteoporosis   . History of radiation therapy 1994    left chest wall  . History of chemotherapy   . Wears glasses   . Wears dentures     full bottom-partial top  . Cancer   . Ovarian ca   . Breast CA   . Eccrine porocarcinoma of skin     Past Surgical History  Procedure Laterality Date  . Left breast mastectomy  1994    T3 N1 Mx  . Shoulder arthroscopy    . Right leg  1990    fx  . Shoulder surgery  1990    replacement-lt  . Modified radical mastectomy w/ axillary lymph node dissection w/ pectoralis minor excision  1994    Left  . Eye surgery      CATARACTS  . Abdominal hysterectomy  2004  . Cholecystectomy  1993  . Breast surgery    . Skin biopsy  04/27/2011    left hand, forearm and posterior arm  . Mass excision Left 05/09/2014    Procedure: EXCISION OF NODULE LEFT CHEST ;  Surgeon: Pedro Earls, MD;  Location: [facility];  Service: General;  Laterality: Left;    Allergies: Ibuprofen and Morphine  Medications: Prior to Admission medications   Medication Sig Start Date End Date Taking? Authorizing Provider  aspirin EC 81 MG tablet Take 81 mg by  mouth at bedtime.    Yes Historical Provider, MD  Calcium Carbonate-Vitamin D (CALCIUM + D PO) Take 600 mg by mouth daily after lunch.    Yes Historical Provider, MD  carvedilol (COREG) 25 MG tablet TAKE ONE TABLET TWICE DAILY WITH FOOD 03/07/15  Yes Marletta Lor, MD  cephALEXin (KEFLEX) 500 MG capsule Take 1 capsule (500 mg total) by mouth 2 (two) times daily. For 7 days 08/06/15  Yes Noland Fordyce, PA-C  Cholecalciferol (VITAMIN D) 2000 UNITS tablet Take 2,000 Units by mouth daily.   Yes Historical Provider, MD  Coenzyme Q10 (CO Q 10) 100 MG CAPS Take 1 capsule by mouth daily.   Yes Historical Provider, MD  fluticasone (FLONASE) 50 MCG/ACT nasal spray Place 2 sprays into both nostrils daily. 10/02/14  Yes Marletta Lor, MD  lisinopril (PRINIVIL,ZESTRIL) 20 MG tablet TAKE 1 TABLET AT BEDTIME. 06/07/15  Yes Marletta Lor, MD  simvastatin (ZOCOR) 40 MG tablet TAKE ONE TABLET DAILY AT BEDTIME 01/24/15  Yes Marletta Lor, MD  traMADol (ULTRAM) 50 MG tablet TAKE ONE TABLET EVERY 6 HOURS AS NEEDED FOR PAIN 01/24/15  Yes Marletta Lor, MD  denosumab [facility]) 60  MG/ML SOLN injection Inject 60 mg into the skin every 6 (six) months. Administer in upper arm, thigh, or abdomen    Historical Provider, MD  ferrous gluconate (FERGON) 324 MG tablet Take 324 mg by mouth daily with breakfast.  04/01/12   Lennis Marion Downer, MD  loperamide (IMODIUM) 2 MG capsule Take 1 capsule (2 mg total) by mouth as needed for diarrhea or loose stools. 01/24/13   Lennis Marion Downer, MD     Family History  Problem Relation Age of Onset  . Breast cancer Sister     dx in her 62s  . Heart disease Sister   . Heart disease Father   . Heart disease Mother   . Stroke Mother   . Prostate cancer Maternal Uncle   . Stroke Maternal Grandmother   . Testicular cancer Maternal Grandfather   . Stroke Maternal Grandfather   . Stroke Paternal Grandmother   . Heart disease Paternal Grandfather   . Breast cancer Sister  31    BRCA2+  . Breast cancer Sister 70  . Prostate cancer Maternal Uncle   . Breast cancer Other     maternal great aunt dx later in life    Social History   Social History  . Marital Status: Widowed    Spouse Name: N/A  . Number of Children: 1  . Years of Education: N/A   Social History Main Topics  . Smoking status: Never Smoker   . Smokeless tobacco: Never Used  . Alcohol Use: No  . Drug Use: No  . Sexual Activity: Not Asked     Comment: didn't ask   Other Topics Concern  . None   Social History Narrative      Review of Systems: A 12 point ROS discussed and pertinent positives are indicated in the HPI above.  All other systems are negative.  Review of Systems  Constitutional: Negative for fever, activity change and appetite change.  Respiratory: Negative for shortness of breath.   Genitourinary: Positive for dysuria and frequency.       Actively treating UTI  Neurological: Negative for weakness.  Psychiatric/Behavioral: Negative for behavioral problems and confusion.    Vital Signs: BP 174/80 mmHg  Pulse 78  Temp(Src) 98 F (36.7 C) (Oral)  Resp 16  Ht _0  (1.575 m)  Wt 107 lb (48.535 kg)  BMI 19.57 kg/m2  SpO2 99%  Physical Exam  Constitutional: She is oriented to person, place, and time.  Cardiovascular: Normal rate, regular rhythm and normal heart sounds.   Pulmonary/Chest: Effort normal and breath sounds normal.  Abdominal: Soft. Bowel sounds are normal.  Musculoskeletal: Normal range of motion.  Multiple skin lesions LUE  Neurological: She is alert and oriented to person, place, and time.  Skin: Skin is warm and dry.  Psychiatric: She has a normal mood and affect. Her behavior is normal. Judgment and thought content normal.  Nursing note and vitals reviewed.   Mallampati Score:  MD Evaluation Airway: WNL Heart: WNL Abdomen: WNL Chest/ Lungs: WNL ASA  Classification: 3 Mallampati/Airway Score: One  Imaging: No results  found.  Labs:  CBC:  Recent Labs  04/18/15 0807 06/07/15 1339 07/29/15 1340 08/07/15 0825  WBC 4.2 3.3* 3.9 4.6  HGB 10.5* 9.8* 10.4* 11.5*  HCT 32.2* 29.7* 31.1* 34.4*  PLT 128* 132* 134* 137*    COAGS:  Recent Labs  08/07/15 0825  INR 0.99    BMP:  Recent Labs  08/16/14 1348 12/24/14 0807 02/18/15 0847 04/18/15  0807  NA 137 140 141 138  K 4.2 4.2 4.0 4.3  CL 101  --   --   --   CO2 35* _0 GLUCOSE 95 93 91 97  BUN 15 19.0 17.5 19.1  CALCIUM 9.3 8.8 9.4 9.3  CREATININE 1.0 0.9 0.9 1.0    LIVER FUNCTION TESTS:  Recent Labs  08/16/14 1348 12/24/14 0807 02/18/15 0847 04/18/15 0807  BILITOT 1.1 1.10 1.27* 1.14  AST _1 ALT _2 ALKPHOS 44 46 46 54  PROT 7.0 5.9* 6.2* 5.9*  ALBUMIN 4.1 3.6 3.8 3.5    TUMOR MARKERS: No results for input(s): AFPTM, CEA, CA199, CHROMGRNA in the last 8760 hours.  Assessment and Plan:  PAC placed 12/2012 Only used once Flushing regularly Now for removal Pt aware of procedure benefits and risks including but not limited to Infection, bleeding; vessel damage Agreeable to proceed Consent signed andin chart  Thank you for this interesting consult.  I greatly enjoyed meeting Judy Herrera and look forward to participating in their care.  A copy of this report was sent to the requesting provider on this date.  Signed: TURPIN,PAMELA A 08/07/2015, 9:17 AM   I spent a total of  30 Minutes   in face to face in clinical consultation, greater than 50% of which was counseling/coordinating care for [facility] removal

## 2015-08-08 ENCOUNTER — Telehealth: Payer: Self-pay | Admitting: *Deleted

## 2015-08-08 LAB — URINE CULTURE: Colony Count: 7000

## 2015-08-19 ENCOUNTER — Ambulatory Visit: Payer: Medicare HMO | Admitting: Oncology

## 2015-08-19 ENCOUNTER — Other Ambulatory Visit: Payer: Medicare HMO

## 2015-08-26 ENCOUNTER — Other Ambulatory Visit: Payer: Self-pay | Admitting: Internal Medicine

## 2015-08-30 ENCOUNTER — Other Ambulatory Visit: Payer: Self-pay | Admitting: Internal Medicine

## 2015-09-06 ENCOUNTER — Encounter: Payer: Self-pay | Admitting: Internal Medicine

## 2015-09-06 ENCOUNTER — Ambulatory Visit (INDEPENDENT_AMBULATORY_CARE_PROVIDER_SITE_OTHER): Payer: Medicare HMO | Admitting: Internal Medicine

## 2015-09-06 VITALS — BP 130/76

## 2015-09-06 DIAGNOSIS — I10 Essential (primary) hypertension: Secondary | ICD-10-CM | POA: Diagnosis not present

## 2015-09-06 DIAGNOSIS — J3801 Paralysis of vocal cords and larynx, unilateral: Secondary | ICD-10-CM | POA: Diagnosis not present

## 2015-09-06 MED ORDER — PREDNISONE 10 MG PO TABS: 10.0000 mg | ORAL_TABLET | Freq: Two times a day (BID) | ORAL | Status: DC

## 2015-09-06 NOTE — Progress Notes (Signed)
Subjective:    Patient ID: Judy Herrera, female    DOB: 1930-12-18, 81 y.o.   MRN: [id number]  HPI  81 year old patient who has a history of allergic rhinitis.  She presents with a two-week history of increasing sinus congestion, fullness and postnasal drip.  There is been no fever.  Local sinus pain or purulent drainage.  She has noticed some worsening hoarseness, although she does have a chronic vocal cord paralysis.  No chest congestion or wheezing or shortness of breath She has essential hypertension which has been stable.  Past Medical History  Diagnosis Date  . Hypertension   . Allergy   . Cataract   . CAD (coronary artery disease)   . Hyperlipidemia   . Cardiomyopathy ([place])     improved  . Arthritis   . Osteoporosis   . History of radiation therapy 1994    left chest wall  . History of chemotherapy   . Wears glasses   . Wears dentures     full bottom-partial top  . Cancer ([place])   . Ovarian ca ([place])   . Breast CA ([place])   . Eccrine porocarcinoma of skin     Social History   Social History  . Marital Status: Widowed    Spouse Name: N/A  . Number of Children: 1  . Years of Education: N/A   Occupational History  . Not on file.   Social History Main Topics  . Smoking status: Never Smoker   . Smokeless tobacco: Never Used  . Alcohol Use: No  . Drug Use: No  . Sexual Activity: Not on file     Comment: didn't ask   Other Topics Concern  . Not on file   Social History Narrative    Past Surgical History  Procedure Laterality Date  . Left breast mastectomy  1994    T3 N1 Mx  . Shoulder arthroscopy    . Right leg  1990    fx  . Shoulder surgery  1990    replacement-lt  . Modified radical mastectomy w/ axillary lymph node dissection w/ pectoralis minor excision  1994    Left  . Eye surgery      CATARACTS  . Abdominal hysterectomy  2004  . Cholecystectomy  1993  . Breast surgery    . Skin biopsy  04/27/2011    left hand, forearm and posterior  arm  . Mass excision Left 05/09/2014    Procedure: EXCISION OF NODULE LEFT CHEST ;  Surgeon: Pedro Earls, MD;  Location: [facility];  Service: General;  Laterality: Left;    Family History  Problem Relation Age of Onset  . Breast cancer Sister     dx in her 47s  . Heart disease Sister   . Heart disease Father   . Heart disease Mother   . Stroke Mother   . Prostate cancer Maternal Uncle   . Stroke Maternal Grandmother   . Testicular cancer Maternal Grandfather   . Stroke Maternal Grandfather   . Stroke Paternal Grandmother   . Heart disease Paternal Grandfather   . Breast cancer Sister 16    BRCA2+  . Breast cancer Sister 46  . Prostate cancer Maternal Uncle   . Breast cancer Other     maternal great aunt dx later in life    Allergies  Allergen Reactions  . Ibuprofen Nausea Only  . Morphine Nausea And Vomiting    Current Outpatient Prescriptions on File Prior  to Visit  Medication Sig Dispense Refill  . aspirin EC 81 MG tablet Take 81 mg by mouth at bedtime.     . Calcium Carbonate-Vitamin D (CALCIUM + D PO) Take 600 mg by mouth daily after lunch.     . carvedilol (COREG) 25 MG tablet TAKE ONE TABLET TWICE DAILY WITH FOOD 180 tablet 0  . Cholecalciferol (VITAMIN D) 2000 UNITS tablet Take 2,000 Units by mouth daily.    . Coenzyme Q10 (CO Q 10) 100 MG CAPS Take 1 capsule by mouth daily.    Marland Kitchen denosumab (PROLIA) 60 MG/ML SOLN injection Inject 60 mg into the skin every 6 (six) months. Administer in upper arm, thigh, or abdomen    . ferrous gluconate (FERGON) 324 MG tablet Take 324 mg by mouth daily with breakfast.     . fluticasone (FLONASE) 50 MCG/ACT nasal spray Place 2 sprays into both nostrils daily. 16 g 5  . lisinopril (PRINIVIL,ZESTRIL) 20 MG tablet TAKE 1 TABLET AT BEDTIME. 90 tablet 1  . loperamide (IMODIUM) 2 MG capsule Take 1 capsule (2 mg total) by mouth as needed for diarrhea or loose stools. 20 capsule 0  . simvastatin (ZOCOR) 40 MG tablet TAKE  ONE TABLET DAILY AT BEDTIME 90 tablet 1  . traMADol (ULTRAM) 50 MG tablet TAKE ONE TABLET EVERY 6 HOURS AS NEEDED FOR PAIN 60 tablet 2   No current facility-administered medications on file prior to visit.    BP 130/76 mmHg     Review of Systems  Constitutional: Negative.   HENT: Positive for congestion, postnasal drip and sinus pressure. Negative for dental problem, hearing loss, rhinorrhea, sore throat and tinnitus.   Eyes: Negative for pain, discharge and visual disturbance.  Respiratory: Negative for cough and shortness of breath.   Cardiovascular: Negative for chest pain, palpitations and leg swelling.  Gastrointestinal: Negative for nausea, vomiting, abdominal pain, diarrhea, constipation, blood in stool and abdominal distention.  Genitourinary: Negative for dysuria, urgency, frequency, hematuria, flank pain, vaginal bleeding, vaginal discharge, difficulty urinating, vaginal pain and pelvic pain.  Musculoskeletal: Negative for joint swelling, arthralgias and gait problem.  Skin: Negative for rash.  Neurological: Negative for dizziness, syncope, speech difficulty, weakness, numbness and headaches.  Hematological: Negative for adenopathy.  Psychiatric/Behavioral: Negative for behavioral problems, dysphoric mood and agitation. The patient is not nervous/anxious.        Objective:   Physical Exam  Constitutional: She is oriented to person, place, and time. She appears well-developed and well-nourished.  HENT:  Head: Normocephalic.  Right Ear: External ear normal.  Left Ear: External ear normal.  Mouth/Throat: Oropharynx is clear and moist.  Eyes: Conjunctivae and EOM are normal. Pupils are equal, round, and reactive to light.  Neck: Normal range of motion. Neck supple. No thyromegaly present.  Cardiovascular: Normal rate, regular rhythm, normal heart sounds and intact distal pulses.   Pulmonary/Chest: Effort normal and breath sounds normal.  Abdominal: Soft. Bowel sounds are  normal. She exhibits no mass. There is no tenderness.  Musculoskeletal: Normal range of motion.  Lymphadenopathy:    She has no cervical adenopathy.  Neurological: She is alert and oriented to person, place, and time.  Skin: Skin is warm and dry. No rash noted.  Psychiatric: She has a normal mood and affect. Her behavior is normal.          Assessment & Plan:   Allergic rhinitis flare Essential hypertension.  Repeat blood pressure improved Coronary artery disease, stable  We'll continue nasal steroids.  We'll  add the saline irrigation, decongestants, expectorants and a brief course of the low-dose oral steroids

## 2015-09-06 NOTE — Progress Notes (Signed)
Pre visit review using our clinic review tool, if applicable. No additional management support is needed unless otherwise documented below in the visit note. 

## 2015-09-06 NOTE — Patient Instructions (Signed)
Acute sinusitis symptoms for less than 10 days are generally not helped by antibiotic therapy.  Use saline irrigation, warm  moist compresses and over-the-counter decongestants only as directed.  Call if there is no improvement in 5 to 7 days, or sooner if you develop increasing pain, fever, or any new symptoms.  HOME CARE INSTRUCTIONS  Drink plenty of water. Water helps thin the mucus so your sinuses can drain more easily.  Use a humidifier.  Inhale steam 3-4 times a day (for example, sit in the bathroom with the shower running).  Apply a warm, moist washcloth to your face 3-4 times a day, or as directed by your health care provider.  Use saline nasal sprays to help moisten and clean your sinuses.  Take medicines only as directed by your health care provider.  

## 2015-09-10 ENCOUNTER — Telehealth: Payer: Self-pay | Admitting: Internal Medicine

## 2015-09-10 NOTE — Telephone Encounter (Signed)
[facility] - Client [facility] Patient Name: Judy Herrera DOB: [birth year] Initial Comment Caller states her BP is up on the predisone. 173/83- Some head pain. Nurse Assessment Nurse: Markus Daft, RN, Sherre Poot Date/Time Eilene Ghazi Time): 09/10/2015 9:17:29 AM Confirm and document reason for call. If symptomatic, describe symptoms. ---Caller states that she has sinus problems and seasonal allergies which is why she is taking Prednisone since this past Friday. Denies cough, SOB or CP. Noticed her BP is elevated since on the prednisone. Last night BP 196/80, and this AM 173/83 with HR 99. She is taking Lisinopril 20 mg QD. Has the patient traveled out of the country within the last 30 days? ---Not Applicable Does the patient have any new or worsening symptoms? ---Yes Will a triage be completed? ---Yes Related visit to physician within the last 2 weeks? ---Yes Does the PT have any chronic conditions? (i.e. diabetes, asthma, etc.) ---Yes List chronic conditions. ---HTN, muscles on one side of heart not pumping as well - on Carvedilol - did not think she had CHF, some paralyzed vocal chords Guidelines Guideline Title Affirmed Question Affirmed Notes High Blood Pressure BP # 160/100 Final Disposition User See PCP When Office is Open (within 3 days) Markus Daft, RN, Sherre Poot Comments Appt made with Dr. Burnice Logan for tomorrow, 09/11/15, at 11 am. Caller aware that if MD wants to do anything differently prior to the appt, the office will call her. Disagree/Comply: Comply

## 2015-09-11 ENCOUNTER — Ambulatory Visit (INDEPENDENT_AMBULATORY_CARE_PROVIDER_SITE_OTHER): Payer: Medicare HMO | Admitting: Internal Medicine

## 2015-09-11 ENCOUNTER — Encounter: Payer: Self-pay | Admitting: Internal Medicine

## 2015-09-11 VITALS — BP 160/88 | HR 85 | Temp 98.4°F | Resp 18 | Ht 62.0 in | Wt 107.0 lb

## 2015-09-11 DIAGNOSIS — I2510 Atherosclerotic heart disease of native coronary artery without angina pectoris: Secondary | ICD-10-CM | POA: Diagnosis not present

## 2015-09-11 DIAGNOSIS — Z23 Encounter for immunization: Secondary | ICD-10-CM | POA: Diagnosis not present

## 2015-09-11 MED ORDER — LISINOPRIL-HYDROCHLOROTHIAZIDE 20-12.5 MG PO TABS: 1.0000 | ORAL_TABLET | Freq: Every day | ORAL | Status: DC

## 2015-09-11 NOTE — Progress Notes (Signed)
Pre visit review using our clinic review tool, if applicable. No additional management support is needed unless otherwise documented below in the visit note. 

## 2015-09-11 NOTE — Progress Notes (Signed)
Subjective:    Patient ID: Judy Herrera, female    DOB: [birth year], 81 y.o.   MRN: [id number]  HPI BP Readings from Last 3 Encounters:  09/11/15 160/88  09/06/15 130/76  08/07/15 25/76   81 year old patient who has essential hypertension.  She has had more recently consistently elevated readings.  She was seen here 5 days ago and blood pressure was a bit labile than.  Blood pressure arrival was elevated, but improved to 1:30 over 76.  She generally feels well She has been compliant with her medications  Past Medical History  Diagnosis Date  . Hypertension   . Allergy   . Cataract   . CAD (coronary artery disease)   . Hyperlipidemia   . Cardiomyopathy ([place])     improved  . Arthritis   . Osteoporosis   . History of radiation therapy 1994    left chest wall  . History of chemotherapy   . Wears glasses   . Wears dentures     full bottom-partial top  . Cancer ([place])   . Ovarian ca ([place])   . Breast CA ([place])   . Eccrine porocarcinoma of skin     Social History   Social History  . Marital Status: Widowed    Spouse Name: N/A  . Number of Children: 1  . Years of Education: N/A   Occupational History  . Not on file.   Social History Main Topics  . Smoking status: Never Smoker   . Smokeless tobacco: Never Used  . Alcohol Use: No  . Drug Use: No  . Sexual Activity: Not on file     Comment: didn't ask   Other Topics Concern  . Not on file   Social History Narrative    Past Surgical History  Procedure Laterality Date  . Left breast mastectomy  1994    T3 N1 Mx  . Shoulder arthroscopy    . Right leg  1990    fx  . Shoulder surgery  1990    replacement-lt  . Modified radical mastectomy w/ axillary lymph node dissection w/ pectoralis minor excision  1994    Left  . Eye surgery      CATARACTS  . Abdominal hysterectomy  2004  . Cholecystectomy  1993  . Breast surgery    . Skin biopsy  04/27/2011    left hand, forearm and posterior arm  . Mass excision  Left 05/09/2014    Procedure: EXCISION OF NODULE LEFT CHEST ;  Surgeon: Pedro Earls, MD;  Location: [facility];  Service: General;  Laterality: Left;    Family History  Problem Relation Age of Onset  . Breast cancer Sister     dx in her 44s  . Heart disease Sister   . Heart disease Father   . Heart disease Mother   . Stroke Mother   . Prostate cancer Maternal Uncle   . Stroke Maternal Grandmother   . Testicular cancer Maternal Grandfather   . Stroke Maternal Grandfather   . Stroke Paternal Grandmother   . Heart disease Paternal Grandfather   . Breast cancer Sister 86    BRCA2+  . Breast cancer Sister 52  . Prostate cancer Maternal Uncle   . Breast cancer Other     maternal great aunt dx later in life    Allergies  Allergen Reactions  . Ibuprofen Nausea Only  . Morphine Nausea And Vomiting    Current Outpatient Prescriptions on File Prior  to Visit  Medication Sig Dispense Refill  . aspirin EC 81 MG tablet Take 81 mg by mouth at bedtime.     . Calcium Carbonate-Vitamin D (CALCIUM + D PO) Take 600 mg by mouth daily after lunch.     . carvedilol (COREG) 25 MG tablet TAKE ONE TABLET TWICE DAILY WITH FOOD 180 tablet 0  . Cholecalciferol (VITAMIN D) 2000 UNITS tablet Take 2,000 Units by mouth daily.    . Coenzyme Q10 (CO Q 10) 100 MG CAPS Take 1 capsule by mouth daily.    Marland Kitchen denosumab (PROLIA) 60 MG/ML SOLN injection Inject 60 mg into the skin every 6 (six) months. Administer in upper arm, thigh, or abdomen    . ferrous gluconate (FERGON) 324 MG tablet Take 324 mg by mouth daily with breakfast.     . fluticasone (FLONASE) 50 MCG/ACT nasal spray Place 2 sprays into both nostrils daily. 16 g 5  . loperamide (IMODIUM) 2 MG capsule Take 1 capsule (2 mg total) by mouth as needed for diarrhea or loose stools. 20 capsule 0  . predniSONE (DELTASONE) 10 MG tablet Take 1 tablet (10 mg total) by mouth 2 (two) times daily with a meal. 14 tablet 0  . simvastatin (ZOCOR) 40  MG tablet TAKE ONE TABLET DAILY AT BEDTIME 90 tablet 1  . traMADol (ULTRAM) 50 MG tablet TAKE ONE TABLET EVERY 6 HOURS AS NEEDED FOR PAIN 60 tablet 2   No current facility-administered medications on file prior to visit.    BP 160/88 mmHg  Pulse 85  Temp(Src) 98.4 F (36.9 C) (Oral)  Resp 18  Ht 5' 2"  (1.575 m)  Wt 107 lb (48.535 kg)  BMI 19.57 kg/m2  SpO2 94%     Review of Systems  Constitutional: Negative.   HENT: Negative for congestion, dental problem, hearing loss, rhinorrhea, sinus pressure, sore throat and tinnitus.   Eyes: Negative for pain, discharge and visual disturbance.  Respiratory: Negative for cough and shortness of breath.   Cardiovascular: Negative for chest pain, palpitations and leg swelling.  Gastrointestinal: Negative for nausea, vomiting, abdominal pain, diarrhea, constipation, blood in stool and abdominal distention.  Genitourinary: Negative for dysuria, urgency, frequency, hematuria, flank pain, vaginal bleeding, vaginal discharge, difficulty urinating, vaginal pain and pelvic pain.  Musculoskeletal: Negative for joint swelling, arthralgias and gait problem.  Skin: Negative for rash.  Neurological: Negative for dizziness, syncope, speech difficulty, weakness, numbness and headaches.  Hematological: Negative for adenopathy.  Psychiatric/Behavioral: Negative for behavioral problems, dysphoric mood and agitation. The patient is not nervous/anxious.        Objective:   Physical Exam  Constitutional: She appears well-developed and well-nourished. No distress.  Blood pressure 160/80          Assessment & Plan:   Hypertension, systolic readings have trended up and have been consistently elevated.  We'll add hydrochlorothiazide 12 point 5 mg to her regimen Continue low-salt diet and home blood pressure monitoring Recheck in 4 weeks  months

## 2015-09-11 NOTE — Patient Instructions (Signed)
Limit your sodium (Salt) intake  Please check your blood pressure on a regular basis.  If it is consistently greater than 160/90, please make an office appointment.

## 2015-09-13 ENCOUNTER — Other Ambulatory Visit: Payer: Self-pay | Admitting: Internal Medicine

## 2015-10-01 ENCOUNTER — Emergency Department (HOSPITAL_COMMUNITY)
Admission: EM | Admit: 2015-10-01 | Discharge: 2015-10-01 | Disposition: A | Discharge status: Home / Self Care | Payer: Medicare HMO | Attending: Emergency Medicine | Admitting: Emergency Medicine

## 2015-10-01 ENCOUNTER — Encounter (HOSPITAL_COMMUNITY): Payer: Self-pay | Admitting: Emergency Medicine

## 2015-10-01 ENCOUNTER — Telehealth: Payer: Self-pay | Admitting: Internal Medicine

## 2015-10-01 ENCOUNTER — Emergency Department (HOSPITAL_COMMUNITY): Payer: Medicare HMO

## 2015-10-01 DIAGNOSIS — Z9221 Personal history of antineoplastic chemotherapy: Secondary | ICD-10-CM | POA: Diagnosis not present

## 2015-10-01 DIAGNOSIS — Z79899 Other long term (current) drug therapy: Secondary | ICD-10-CM | POA: Insufficient documentation

## 2015-10-01 DIAGNOSIS — I10 Essential (primary) hypertension: Secondary | ICD-10-CM | POA: Diagnosis not present

## 2015-10-01 DIAGNOSIS — E785 Hyperlipidemia, unspecified: Secondary | ICD-10-CM | POA: Insufficient documentation

## 2015-10-01 DIAGNOSIS — R0600 Dyspnea, unspecified: Secondary | ICD-10-CM

## 2015-10-01 DIAGNOSIS — Z85828 Personal history of other malignant neoplasm of skin: Secondary | ICD-10-CM | POA: Insufficient documentation

## 2015-10-01 DIAGNOSIS — Z7951 Long term (current) use of inhaled steroids: Secondary | ICD-10-CM | POA: Insufficient documentation

## 2015-10-01 DIAGNOSIS — Z9012 Acquired absence of left breast and nipple: Secondary | ICD-10-CM | POA: Insufficient documentation

## 2015-10-01 DIAGNOSIS — M810 Age-related osteoporosis without current pathological fracture: Secondary | ICD-10-CM | POA: Diagnosis not present

## 2015-10-01 DIAGNOSIS — Z7982 Long term (current) use of aspirin: Secondary | ICD-10-CM | POA: Diagnosis not present

## 2015-10-01 DIAGNOSIS — M1990 Unspecified osteoarthritis, unspecified site: Secondary | ICD-10-CM | POA: Diagnosis not present

## 2015-10-01 DIAGNOSIS — H269 Unspecified cataract: Secondary | ICD-10-CM | POA: Insufficient documentation

## 2015-10-01 DIAGNOSIS — Z853 Personal history of malignant neoplasm of breast: Secondary | ICD-10-CM | POA: Insufficient documentation

## 2015-10-01 DIAGNOSIS — Z8543 Personal history of malignant neoplasm of ovary: Secondary | ICD-10-CM | POA: Diagnosis not present

## 2015-10-01 DIAGNOSIS — R0602 Shortness of breath: Secondary | ICD-10-CM | POA: Diagnosis not present

## 2015-10-01 DIAGNOSIS — I2510 Atherosclerotic heart disease of native coronary artery without angina pectoris: Secondary | ICD-10-CM | POA: Diagnosis not present

## 2015-10-01 LAB — URINALYSIS, ROUTINE W REFLEX MICROSCOPIC
Bilirubin Urine: NEGATIVE
GLUCOSE, UA: NEGATIVE mg/dL
HGB URINE DIPSTICK: NEGATIVE
Ketones, ur: NEGATIVE mg/dL
Leukocytes, UA: NEGATIVE
Nitrite: NEGATIVE
PROTEIN: NEGATIVE mg/dL
Specific Gravity, Urine: 1.008 (ref 1.005–1.030)
pH: 7.5 (ref 5.0–8.0)

## 2015-10-01 LAB — CBC
HEMATOCRIT: 33.9 % — AB (ref 36.0–46.0)
Hemoglobin: 11.2 g/dL — ABNORMAL LOW (ref 12.0–15.0)
MCH: 30.9 pg (ref 26.0–34.0)
MCHC: 33 g/dL (ref 30.0–36.0)
MCV: 93.6 fL (ref 78.0–100.0)
PLATELETS: 134 10*3/uL — AB (ref 150–400)
RBC: 3.62 MIL/uL — AB (ref 3.87–5.11)
RDW: 12.8 % (ref 11.5–15.5)
WBC: 3.3 10*3/uL — AB (ref 4.0–10.5)

## 2015-10-01 LAB — COMPREHENSIVE METABOLIC PANEL
ALBUMIN: 3.8 g/dL (ref 3.5–5.0)
ALT: 15 U/L (ref 14–54)
AST: 21 U/L (ref 15–41)
Alkaline Phosphatase: 46 U/L (ref 38–126)
Anion gap: 7 (ref 5–15)
BILIRUBIN TOTAL: 1.3 mg/dL — AB (ref 0.3–1.2)
BUN: 19 mg/dL (ref 6–20)
CHLORIDE: 95 mmol/L — AB (ref 101–111)
CO2: 33 mmol/L — ABNORMAL HIGH (ref 22–32)
CREATININE: 0.94 mg/dL (ref 0.44–1.00)
Calcium: 9.4 mg/dL (ref 8.9–10.3)
GFR calc Af Amer: >60 mL/min (ref >60)
GFR, EST NON AFRICAN AMERICAN: 55 mL/min — AB (ref >60)
GLUCOSE: 97 mg/dL (ref 65–99)
POTASSIUM: 3.6 mmol/L (ref 3.5–5.1)
Sodium: 135 mmol/L (ref 135–145)
Total Protein: 6.4 g/dL — ABNORMAL LOW (ref 6.5–8.1)

## 2015-10-01 LAB — I-STAT TROPONIN, ED: Troponin i, poc: 0.01 ng/mL (ref 0.00–0.08)

## 2015-10-01 LAB — TROPONIN I

## 2015-10-01 MED ORDER — IOHEXOL 350 MG/ML SOLN
100.0000 mL | Freq: Once | INTRAVENOUS | Status: AC | PRN
  Administered 2015-10-01: 80 mL via INTRAVENOUS

## 2015-10-01 MED ORDER — SODIUM CHLORIDE 0.9 % IV SOLN
INTRAVENOUS | Status: DC
  Administered 2015-10-01: 1000 mL via INTRAVENOUS

## 2015-10-01 NOTE — ED Provider Notes (Signed)
CSN: [id number]     Arrival date & time 10/01/15  1013 History   First MD Initiated Contact with Patient 10/01/15 1035     Chief Complaint  Patient presents with  . Shortness of Breath     (Consider location/radiation/quality/duration/timing/severity/associated sxs/prior Treatment) Patient is a 81 y.o. female presenting with shortness of breath. The history is provided by the patient.  Shortness of Breath Associated symptoms: no abdominal pain, no fever, no headaches, no neck pain, no rash, no sore throat and no vomiting   Patient w hx breast ca, ovarian ca, poroca, presents w sob in the past 2 weeks. Mild-mod. Persistent. No specific exacerbating or alleviating factors. Has felt generally tight in mid chest. No exertional/episodic chest pain. No assoc nv or diaphoresis. No pleuritic pain. No leg pain or swelling. No orthopnea or pnd.  Denies hx lung disease. Non smoker. No recent chemo.   Compliant w normal meds. Denies hx cad. +fam hx cad/parents. No hx dvt or pe. Denies fever or chills. Denies cough or uri c/o.       Past Medical History  Diagnosis Date  . Hypertension   . Allergy   . Cataract   . CAD (coronary artery disease)   . Hyperlipidemia   . Cardiomyopathy ([place])     improved  . Arthritis   . Osteoporosis   . History of radiation therapy 1994    left chest wall  . History of chemotherapy   . Wears glasses   . Wears dentures     full bottom-partial top  . Cancer ([place])   . Ovarian ca ([place])   . Breast CA ([place])   . Eccrine porocarcinoma of skin    Past Surgical History  Procedure Laterality Date  . Left breast mastectomy  1994    T3 N1 Mx  . Shoulder arthroscopy    . Right leg  1990    fx  . Shoulder surgery  1990    replacement-lt  . Modified radical mastectomy w/ axillary lymph node dissection w/ pectoralis minor excision  1994    Left  . Eye surgery      CATARACTS  . Abdominal hysterectomy  2004  . Cholecystectomy  1993  . Breast surgery    . Skin  biopsy  04/27/2011    left hand, forearm and posterior arm  . Mass excision Left 05/09/2014    Procedure: EXCISION OF NODULE LEFT CHEST ;  Surgeon: Pedro Earls, MD;  [facility];  Service: General;  Laterality: Left;   Family History  Problem Relation Age of Onset  . Breast cancer Sister     dx in her 69s  . Heart disease Sister   . Heart disease Father   . Heart disease Mother   . Stroke Mother   . Prostate cancer Maternal Uncle   . Stroke Maternal Grandmother   . Testicular cancer Maternal Grandfather   . Stroke Maternal Grandfather   . Stroke Paternal Grandmother   . Heart disease Paternal Grandfather   . Breast cancer Sister 67    BRCA2+  . Breast cancer Sister 45  . Prostate cancer Maternal Uncle   . Breast cancer Other     maternal great aunt dx later in life   Social History  Substance Use Topics  . Smoking status: Never Smoker   . Smokeless tobacco: Never Used  . Alcohol Use: No   OB History    No data available     Review  of Systems  Constitutional: Negative for fever and chills.  HENT: Negative for sore throat.   Eyes: Negative for redness.  Respiratory: Positive for shortness of breath.   Cardiovascular: Negative for leg swelling.  Gastrointestinal: Negative for vomiting and abdominal pain.  Genitourinary: Negative for dysuria and flank pain.  Musculoskeletal: Negative for back pain and neck pain.  Skin: Negative for rash.  Neurological: Negative for headaches.  Hematological: Does not bruise/bleed easily.  Psychiatric/Behavioral: Negative for confusion.      Allergies  Ibuprofen and Morphine  Home Medications   Prior to Admission medications   Medication Sig Start Date End Date Taking? Authorizing Provider  aspirin EC 81 MG tablet Take 81 mg by mouth at bedtime.    Yes Historical Provider, MD  Calcium Carbonate-Vitamin D (CALCIUM + D PO) Take 600 mg by mouth daily after lunch.    Yes Historical Provider, MD   carvedilol (COREG) 25 MG tablet TAKE ONE TABLET TWICE DAILY WITH FOOD 09/13/15  Yes Marletta Lor, MD  Cholecalciferol (VITAMIN D) 2000 UNITS tablet Take 2,000 Units by mouth daily.   Yes Historical Provider, MD  Coenzyme Q10 (CO Q 10) 100 MG CAPS Take 1 capsule by mouth daily.   Yes Historical Provider, MD  denosumab (PROLIA) 60 MG/ML SOLN injection Inject 60 mg into the skin every 6 (six) months. Administer in upper arm, thigh, or abdomen   Yes Historical Provider, MD  ferrous gluconate (FERGON) 324 MG tablet Take 324 mg by mouth daily with breakfast.  04/01/12  Yes Lennis P Livesay, MD  fluticasone (FLONASE) 50 MCG/ACT nasal spray Place 2 sprays into both nostrils daily. 08/30/15  Yes Marletta Lor, MD  lisinopril-hydrochlorothiazide (ZESTORETIC) 20-12.5 MG tablet Take 1 tablet by mouth daily. 09/11/15  Yes Marletta Lor, MD  simvastatin (ZOCOR) 40 MG tablet TAKE ONE TABLET DAILY AT BEDTIME 01/24/15  Yes Marletta Lor, MD  traMADol (ULTRAM) 50 MG tablet TAKE ONE TABLET EVERY 6 HOURS AS NEEDED FOR PAIN 08/26/15  Yes Marletta Lor, MD  loperamide (IMODIUM) 2 MG capsule Take 1 capsule (2 mg total) by mouth as needed for diarrhea or loose stools. Patient not taking: Reported on 10/01/2015 01/24/13   Gordy Levan, MD  predniSONE (DELTASONE) 10 MG tablet Take 1 tablet (10 mg total) by mouth 2 (two) times daily with a meal. Patient not taking: Reported on 10/01/2015 09/06/15   Marletta Lor, MD   BP 155/68 mmHg  Pulse 77  Temp(Src) 97.4 F (36.3 C) (Oral)  Resp 17  Ht _0  (1.575 m)  Wt 107 lb (48.535 kg)  BMI 19.57 kg/m2  SpO2 95% Physical Exam  Constitutional: She appears well-developed and well-nourished. No distress.  HENT:  Mouth/Throat: Oropharynx is clear and moist.  Eyes: Conjunctivae are normal. No scleral icterus.  Neck: Neck supple. No JVD present. No tracheal deviation present.  Cardiovascular: Normal rate, regular rhythm, normal heart  sounds and intact distal pulses.  Exam reveals no gallop and no friction rub.   No murmur heard. Pulmonary/Chest: Effort normal and breath sounds normal. No respiratory distress.  Abdominal: Soft. Normal appearance and bowel sounds are normal. She exhibits no distension. There is no tenderness.  Musculoskeletal: She exhibits no edema or tenderness.  Neurological: She is alert.  Skin: Skin is warm and dry. No rash noted. She is not diaphoretic.  Psychiatric: She has a normal mood and affect.  Nursing note and vitals reviewed.   ED Course  Procedures (including critical  care time) Labs Review   Results for orders placed or performed during the hospital encounter of 10/01/15  CBC  Result Value Ref Range   WBC 3.3 (L) 4.0 - 10.5 K/uL   RBC 3.62 (L) 3.87 - 5.11 MIL/uL   Hemoglobin 11.2 (L) 12.0 - 15.0 g/dL   HCT 33.9 (L) 36.0 - 46.0 %   MCV 93.6 78.0 - 100.0 fL   MCH 30.9 26.0 - 34.0 pg   MCHC 33.0 30.0 - 36.0 g/dL   RDW 12.8 11.5 - 15.5 %   Platelets 134 (L) 150 - 400 K/uL  Comprehensive metabolic panel  Result Value Ref Range   Sodium 135 135 - 145 mmol/L   Potassium 3.6 3.5 - 5.1 mmol/L   Chloride 95 (L) 101 - 111 mmol/L   CO2 33 (H) 22 - 32 mmol/L   Glucose, Bld 97 65 - 99 mg/dL   BUN 19 6 - 20 mg/dL   Creatinine, Ser 0.94 0.44 - 1.00 mg/dL   Calcium 9.4 8.9 - 10.3 mg/dL   Total Protein 6.4 (L) 6.5 - 8.1 g/dL   Albumin 3.8 3.5 - 5.0 g/dL   AST 21 15 - 41 U/L   ALT 15 14 - 54 U/L   Alkaline Phosphatase 46 38 - 126 U/L   Total Bilirubin 1.3 (H) 0.3 - 1.2 mg/dL   GFR calc non Af Amer 55 (L) >60 mL/min   GFR calc Af Amer >60 >60 mL/min   Anion gap 7 5 - 15  Troponin I  Result Value Ref Range   Troponin I <0.03 <0.031 ng/mL  Urinalysis, Routine w reflex microscopic (not at [facility])  Result Value Ref Range   Color, Urine YELLOW YELLOW   APPearance CLEAR CLEAR   Specific Gravity, Urine 1.008 1.005 - 1.030   pH 7.5 5.0 - 8.0   Glucose, UA NEGATIVE NEGATIVE mg/dL   Hgb  urine dipstick NEGATIVE NEGATIVE   Bilirubin Urine NEGATIVE NEGATIVE   Ketones, ur NEGATIVE NEGATIVE mg/dL   Protein, ur NEGATIVE NEGATIVE mg/dL   Nitrite NEGATIVE NEGATIVE   Leukocytes, UA NEGATIVE NEGATIVE  I-Stat Troponin, ED (not at [facility])  Result Value Ref Range   Troponin i, poc 0.01 0.00 - 0.08 ng/mL   Comment 3           Dg Chest 2 View  10/01/2015  CLINICAL DATA:  81 year old female with difficulty breathing for the past 2-3 weeks. Worsening shortness of breath this morning. EXAM: CHEST  2 VIEW COMPARISON:  Chest x-ray 01/27/2013. FINDINGS: Lung volumes are normal. No consolidative airspace disease. No pleural effusions. No pneumothorax. No pulmonary nodule or mass noted. Pulmonary vasculature and the cardiomediastinal silhouette are within normal limits. Mild scarring in the apices of the thorax bilaterally (left greater than right), presumably related to remote infection. Atherosclerotic calcifications in the thoracic aorta. Surgical clips in the left axilla from prior lymph node dissection. Status post left shoulder arthroplasty. Densely calcified left second costochondral cartilage again noted. Lateral projection demonstrates a chronic compression fracture of T12 with a vertebra plana appearance (unchanged). IMPRESSION: 1. No radiographic evidence of acute cardiopulmonary disease. 2. Atherosclerosis. 3. Additional incidental findings, as above. Electronically Signed   By: [name] M.D.   On: 10/01/2015 11:12   Ct Angio Chest Pe W/cm &/or Wo Cm  10/01/2015  CLINICAL DATA:  Difficulty breathing for 2-3 weeks, more shorter breath this morning, question pulmonary embolism, personal history of hypertension, coronary artery disease, breast cancer, ovarian cancer EXAM: CT  ANGIOGRAPHY CHEST WITH CONTRAST TECHNIQUE: Multidetector CT imaging of the chest was performed using the standard protocol during bolus administration of intravenous contrast. Multiplanar CT image reconstructions and  MIPs were obtained to evaluate the vascular anatomy. CONTRAST:  71m OMNIPAQUE IOHEXOL 350 MG/ML SOLN IV COMPARISON:  01/07/2015 FINDINGS: Extensive atherosclerotic calcification aorta, proximal great vessels and coronary arteries. Aneurysmal dilatation ascending thoracic aorta 4.0 cm transverse image 43. Scattered beam hardening artifacts from orthopedic hardware proximal LEFT humerus. Enlargement of RIGHT pulmonary artery question pulmonary arterial hypertension. Pulmonary arteries well opacified and patent. No evidence of pulmonary embolism. Post cholecystectomy. BILATERAL renal cortical atrophy. Scarring and volume loss in medial LEFT upper lobe. Remaining lungs hyperinflated but clear. No pleural effusion or pneumothorax. Bones diffusely demineralized with marked compression deformity/vertebra plana at T12 unchanged, associated with mild focal kyphosis and mild retropulsion of fragments, unchanged. Review of the MIP images confirms the above findings. IMPRESSION: No evidence of pulmonary embolism. Question pulmonary arterial hypertension. Hyperinflated lungs with medial LEFT upper lobe scarring, stable. No acute abnormalities. Chronic marked T12 compression fracture. Extensive atherosclerotic disease with aneurysmal dilatation ascending thoracic aorta, recommendation below. Recommend annual imaging followup by CTA or MRA. This recommendation follows 2010 ACCF/AHA/AATS/ACR/ASA/SCA/SCAI/SIR/STS/SVM Guidelines for the Diagnosis and Management of Patients with Thoracic Aortic Disease. Circulation. 2010; 121:: [id number] Signed   By: MLavonia DanaM.D.   On: 10/01/2015 14:06      I have personally reviewed and evaluated these images and lab results as part of my medical decision-making.   EKG Interpretation   Date/Time:  Tuesday October 01 2015 10:24:52 EST Ventricular Rate:  82 PR Interval:  178 QRS Duration: 153 QT Interval:  433 QTC Calculation: 506 R Axis:   -149 Text Interpretation:   Sinus rhythm Left bundle branch block Prolonged QT  interval No significant change since last tracing Confirmed by Carra Brindley  MD,  Destanee Bedonie ([id number] on 10/01/2015 10:36:24 AM      MDM   Iv ns. Labs. Cxr.  Ecg.  Reviewed nursing notes and prior charts for additional history.   Ct angio neg acute.  Initial and repeat trop neg.   Recheck pt, currently denies cp or sob.   Vitals normal exc mild elevated bp.  Room air pulse ox 96%.    rec close pcp/card f/u.  Return precautions provided.     KLajean Saver MD 10/01/15 [phone]

## 2015-10-01 NOTE — ED Notes (Signed)
Pt can't be stuck in lt arm and limited access on right so carrie is trying to stick pt.

## 2015-10-01 NOTE — Telephone Encounter (Signed)
Patient Name: Judy Herrera  DOB: Jan 05, 1931    Initial Comment Caller states she is having trouble breathing.   Nurse Assessment  Nurse: Leilani Merl, RN, Heather Date/Time (Eastern Time): 10/01/2015 9:38:11 AM  Confirm and document reason for call. If symptomatic, describe symptoms. ---Caller states she is having trouble breathing, it started when she got up this morning. She has had this off and on for the last 4 weeks.  Has the patient traveled out of the country within the last 30 days? ---Not Applicable  Does the patient have any new or worsening symptoms? ---Yes  Will a triage be completed? ---Yes  Related visit to physician within the last 2 weeks? ---No  Does the PT have any chronic conditions? (i.e. diabetes, asthma, etc.) ---Yes  List chronic conditions. ---HTN     Guidelines    Guideline Title Affirmed Question Affirmed Notes  Breathing Difficulty [1] MODERATE difficulty breathing (e.g., speaks in phrases, SOB even at rest, pulse 100-120) AND [2] NEW-onset or WORSE than normal    Final Disposition User   Go to ED Now Standifer, RN, Heather    Referrals  [facility] - ED   Disagree/Comply: Comply

## 2015-10-01 NOTE — Telephone Encounter (Signed)
Patient checked into ED 

## 2015-10-01 NOTE — ED Notes (Signed)
Patient states she has had difficult breathing for 2-3 weeks. Patient states she became more short of breath this morning. Denies cough, n/v/d, and pain.

## 2015-10-01 NOTE — ED Notes (Signed)
Patient was alert, oriented and stable upon discharge. RN went over AVS and patient had no further questions.  

## 2015-10-01 NOTE — ED Notes (Signed)
Patient transported to CT 

## 2015-10-01 NOTE — Discharge Instructions (Signed)
It was our pleasure to provide your ER care today - we hope that you feel better.  Follow up with primary care doctor, and/or your cardiologist, in the next couple days for recheck - call office tomorrow morning to arrange follow up appointment.  Your ct scan was read as showing no acute process - note was made of dilated ascending aorta, 4 cm, discuss with your doctors at follow up with them.   Return to ER right away if worse, new symptoms, fevers, increased difficulty breathing, weak/fainting, recurrent or persistent chest pain, other concern.    Shortness of Breath Shortness of breath means you have trouble breathing. It could also mean that you have a medical problem. You should get immediate medical care for shortness of breath. CAUSES   Not enough oxygen in the air such as with high altitudes or a smoke-filled room.  Certain lung diseases, infections, or problems.  Heart disease or conditions, such as angina or heart failure.  Low red blood cells (anemia).  Poor physical fitness, which can cause shortness of breath when you exercise.  Chest or back injuries or stiffness.  Being overweight.  Smoking.  Anxiety, which can make you feel like you are not getting enough air. DIAGNOSIS  Serious medical problems can often be found during your physical exam. Tests may also be done to determine why you are having shortness of breath. Tests may include:  Chest X-rays.  Lung function tests.  Blood tests.  An electrocardiogram (ECG).  An ambulatory electrocardiogram. An ambulatory ECG records your heartbeat patterns over a 24-hour period.  Exercise testing.  A transthoracic echocardiogram (TTE). During echocardiography, sound waves are used to evaluate how blood flows through your heart.  A transesophageal echocardiogram (TEE).  Imaging scans. Your health care provider may not be able to find a cause for your shortness of breath after your exam. In this case, it is  important to have a follow-up exam with your health care provider as directed.  TREATMENT  Treatment for shortness of breath depends on the cause of your symptoms and can vary greatly. HOME CARE INSTRUCTIONS   Do not smoke. Smoking is a common cause of shortness of breath. If you smoke, ask for help to quit.  Avoid being around chemicals or things that may bother your breathing, such as paint fumes and dust.  Rest as needed. Slowly resume your usual activities.  If medicines were prescribed, take them as directed for the full length of time directed. This includes oxygen and any inhaled medicines.  Keep all follow-up appointments as directed by your health care provider. SEEK MEDICAL CARE IF:   Your condition does not improve in the time expected.  You have a hard time doing your normal activities even with rest.  You have any new symptoms. SEEK IMMEDIATE MEDICAL CARE IF:   Your shortness of breath gets worse.  You feel light-headed, faint, or develop a cough not controlled with medicines.  You start coughing up blood.  You have pain with breathing.  You have chest pain or pain in your arms, shoulders, or abdomen.  You have a fever.  You are unable to walk up stairs or exercise the way you normally do. MAKE SURE YOU:  Understand these instructions.  Will watch your condition.  Will get help right away if you are not doing well or get worse.   This information is not intended to replace advice given to you by your health care provider. Make sure you discuss  any questions you have with your health care provider.   Document Released: 07/28/2001 Document Revised: 11/07/2013 Document Reviewed: 01/18/2012 Elsevier Interactive Patient Education [employer].

## 2015-10-04 ENCOUNTER — Ambulatory Visit (INDEPENDENT_AMBULATORY_CARE_PROVIDER_SITE_OTHER): Payer: Medicare HMO | Admitting: Physician Assistant

## 2015-10-04 ENCOUNTER — Ambulatory Visit (INDEPENDENT_AMBULATORY_CARE_PROVIDER_SITE_OTHER): Payer: Medicare HMO

## 2015-10-04 ENCOUNTER — Encounter: Payer: Self-pay | Admitting: Physician Assistant

## 2015-10-04 VITALS — BP 102/70 | HR 79 | Ht 62.0 in | Wt 104.1 lb

## 2015-10-04 DIAGNOSIS — I420 Dilated cardiomyopathy: Secondary | ICD-10-CM

## 2015-10-04 DIAGNOSIS — I712 Thoracic aortic aneurysm, without rupture: Secondary | ICD-10-CM

## 2015-10-04 DIAGNOSIS — E785 Hyperlipidemia, unspecified: Secondary | ICD-10-CM | POA: Diagnosis not present

## 2015-10-04 DIAGNOSIS — J449 Chronic obstructive pulmonary disease, unspecified: Secondary | ICD-10-CM

## 2015-10-04 DIAGNOSIS — I447 Left bundle-branch block, unspecified: Secondary | ICD-10-CM

## 2015-10-04 DIAGNOSIS — I7121 Aneurysm of the ascending aorta, without rupture: Secondary | ICD-10-CM | POA: Insufficient documentation

## 2015-10-04 DIAGNOSIS — I10 Essential (primary) hypertension: Secondary | ICD-10-CM | POA: Diagnosis not present

## 2015-10-04 DIAGNOSIS — R0600 Dyspnea, unspecified: Secondary | ICD-10-CM

## 2015-10-04 DIAGNOSIS — R002 Palpitations: Secondary | ICD-10-CM | POA: Diagnosis not present

## 2015-10-04 DIAGNOSIS — I2510 Atherosclerotic heart disease of native coronary artery without angina pectoris: Secondary | ICD-10-CM

## 2015-10-04 MED ORDER — PANTOPRAZOLE SODIUM 40 MG PO TBEC: 40.0000 mg | DELAYED_RELEASE_TABLET | Freq: Every day | ORAL | Status: DC

## 2015-10-04 NOTE — Patient Instructions (Signed)
Your physician recommends that you schedule a follow-up appointment in: 1 Month with Dr Stanford Breed or Tarri Fuller  Your physician has requested that you have an echocardiogram. Echocardiography is a painless test that uses sound waves to create images of your heart. It provides your doctor with information about the size and shape of your heart and how well your heart's chambers and valves are working. This procedure takes approximately one hour. There are no restrictions for this procedure.  Your physician has recommended that you wear an event monitor for 1 Week. Event monitors are medical devices that record the heart's electrical activity. Doctors most often [place] these monitors to diagnose arrhythmias. Arrhythmias are problems with the speed or rhythm of the heartbeat. The monitor is a small, portable device. You can wear one while you do your normal daily activities. This is usually used to diagnose what is causing palpitations/syncope (passing out).  Your physician has recommended you make the following change in your medication: START Protonix 40 mg daily

## 2015-10-04 NOTE — Progress Notes (Signed)
Patient ID: Laurianne Floresca, female   DOB: [birth year], 81 y.o.   MRN: [id number]    Date:  10/04/2015   ID:  Klara Stjames, DOB 09-30-1931, MRN [id number]  PCP:  Nyoka Cowden, MD  Primary Cardiologist:  Stanford Breed   Chief Complaint  Patient presents with  . Follow-up    post ED for SOB/ still having SOB on minimal exertion//pt states no other Sx.     History of Present Illness: Zelia Yzaguirre is a 81 y.o. female with a history of cardiomyopathy which is been felt to be out of proportion to her coronary artery disease. She also has a history of hyperlipidemia, ascending aortic aneurysm which in February 2016 was 37.4 mm, hypertension breast cancer, radiation therapy.  She had cardiac catheterization 08/18/2005 with a 60% obtuse marginal but otherwise nonobstructive CAD. Last echo was in October 2015 showed normal LV function grade 1 diastolic dysfunction. She was seen in February of this year due to syncope in the event monitor revealed sinus rhythm with occasional PACs and PVCs.  Patient presents today with paroxysmal dyspnea.  She reports that these episodes are intermittent and lasts just a few minutes. She's having several per week. She isn't doing anything in particular when they occur. The last one was on Tuesday morning she was just sitting there and suddenly felt short of breath. The only other symptom is some mild discomfort near her throat/manubrium.  She denies any nausea, vomiting, fever, chest pain, dizziness, orthopnea, PND, lower from edema.  She went to the emergency room on the 15th because of the symptoms. She had a CT angiogram of the chest which was negative thinking negative for anything acute but did reveal a slight increase in the size of her ascending aortic aneurysm to 39.8 mm     Wt Readings from Last 3 Encounters:  10/04/15 104 lb 1.6 oz (47.219 kg)  10/01/15 107 lb (48.535 kg)  09/11/15 107 lb (48.535 kg)     Past Medical History  Diagnosis Date    . Hypertension   . Allergy   . Cataract   . CAD (coronary artery disease)   . Hyperlipidemia   . Cardiomyopathy ([place])     improved  . Arthritis   . Osteoporosis   . History of radiation therapy 1994    left chest wall  . History of chemotherapy   . Wears glasses   . Wears dentures     full bottom-partial top  . Cancer ([place])   . Ovarian ca ([place])   . Breast CA ([place])   . Eccrine porocarcinoma of skin     Current Outpatient Prescriptions  Medication Sig Dispense Refill  . aspirin EC 81 MG tablet Take 81 mg by mouth at bedtime.     . Calcium Carbonate-Vitamin D (CALCIUM + D PO) Take 600 mg by mouth daily after lunch.     . carvedilol (COREG) 25 MG tablet TAKE ONE TABLET TWICE DAILY WITH FOOD 180 tablet 1  . Cholecalciferol (VITAMIN D) 2000 UNITS tablet Take 2,000 Units by mouth daily.    . Coenzyme Q10 (CO Q 10) 100 MG CAPS Take 1 capsule by mouth daily.    Marland Kitchen denosumab (PROLIA) 60 MG/ML SOLN injection Inject 60 mg into the skin every 6 (six) months. Administer in upper arm, thigh, or abdomen    . ferrous gluconate (FERGON) 324 MG tablet Take 324 mg by mouth daily with breakfast.     . fluticasone (FLONASE)  50 MCG/ACT nasal spray Place 2 sprays into both nostrils daily. 16 g 5  . lisinopril-hydrochlorothiazide (ZESTORETIC) 20-12.5 MG tablet Take 1 tablet by mouth daily. 90 tablet 3  . loperamide (IMODIUM) 2 MG capsule Take 1 capsule (2 mg total) by mouth as needed for diarrhea or loose stools. 20 capsule 0  . simvastatin (ZOCOR) 40 MG tablet TAKE ONE TABLET DAILY AT BEDTIME 90 tablet 1  . traMADol (ULTRAM) 50 MG tablet TAKE ONE TABLET EVERY 6 HOURS AS NEEDED FOR PAIN 60 tablet 2  . pantoprazole (PROTONIX) 40 MG tablet Take 1 tablet (40 mg total) by mouth daily. 90 tablet 1   No current facility-administered medications for this visit.    Allergies:    Allergies  Allergen Reactions  . Ibuprofen Nausea Only  . Morphine Nausea And Vomiting    Social History:  The patient   reports that she has never smoked. She has never used smokeless tobacco. She reports that she does not drink alcohol or use illicit drugs.   Family history:   Family History  Problem Relation Age of Onset  . Breast cancer Sister     dx in her 37s  . Heart disease Sister   . Heart disease Father   . Heart disease Mother   . Stroke Mother   . Prostate cancer Maternal Uncle   . Stroke Maternal Grandmother   . Testicular cancer Maternal Grandfather   . Stroke Maternal Grandfather   . Stroke Paternal Grandmother   . Heart disease Paternal Grandfather   . Breast cancer Sister 13    BRCA2+  . Breast cancer Sister 64  . Prostate cancer Maternal Uncle   . Breast cancer Other     maternal great aunt dx later in life    ROS:  Please see the history of present illness.  All other systems reviewed and negative.   PHYSICAL EXAM: VS:  BP 102/70 mmHg  Pulse 79  Ht 5' 2"  (1.575 m)  Wt 104 lb 1.6 oz (47.219 kg)  BMI 19.04 kg/m2 Well nourished, well developed, in no acute distress HEENT: Pupils are equal round react to light accommodation extraocular movements are intact.  Neck: no JVDNo cervical lymphadenopathy. Cardiac: Regular rate and rhythm without murmurs rubs or gallops. Lungs:  clear to auscultation bilaterally, no wheezing, rhonchi or rales Abd: soft, nontender, positive bowel sounds all quadrants, no hepatosplenomegaly Ext: no lower extremity edema.  2+ radial and dorsalis pedis pulses. Skin: warm and dry Neuro:  Grossly normal  EKG:  EKG in the emergency room on 10/01/2015: Left bundle branch block (old), rate 82 bpm  ASSESSMENT AND PLAN:  Problem List Items Addressed This Visit    LBBB (left bundle branch block)   Hyperlipidemia   Essential hypertension   Dyspnea - Primary   Relevant Orders   ECHOCARDIOGRAM COMPLETE   Cardiac event monitor   Coronary atherosclerosis   COPD (chronic obstructive pulmonary disease) (HCC)   Congestive dilated cardiomyopathy (HCC)    Ascending aortic aneurysm (HCC)     Dyspnea: Patient reports having a periodic episodes of dyspnea while at rest. She is euvolemic on exam and is not having any issues with orthopnea or PND.  She does report some slight discomfort up near her throat/manubrium which may be associated with shortness of breath. She was me to believe she may be having some acid reflux possibly. Start her on hypertonic 40 mg daily. Also check a 2-D echocardiogram. She may be having some paroxysmal atrial fibrillation/flutter  or runs of PVCs which are causing the dyspnea. We'll do an event monitor for 1 week.  Essential hypertension: Blood pressure well-controlled. No changes in hypertensive medications.  Ascending aortic aneurysm: There is a slight increase of size compared to February 2016. It is now 39.8 mm. Continue to monitor.  Hyperlipidemia: Continue statin  COPD: No wheezing on exam. She's not having an exacerbation.  Coronary artery disease: She denies any chest pain. Continue aspirin, beta blocker

## 2015-10-13 ENCOUNTER — Emergency Department (HOSPITAL_COMMUNITY): Payer: Medicare HMO

## 2015-10-13 ENCOUNTER — Encounter (HOSPITAL_COMMUNITY): Payer: Self-pay | Admitting: Nurse Practitioner

## 2015-10-13 ENCOUNTER — Emergency Department (HOSPITAL_COMMUNITY)
Admission: EM | Admit: 2015-10-13 | Discharge: 2015-10-13 | Disposition: A | Discharge status: Home / Self Care | Payer: Medicare HMO | Attending: Emergency Medicine | Admitting: Emergency Medicine

## 2015-10-13 DIAGNOSIS — Z87720 Personal history of (corrected) congenital malformations of eye: Secondary | ICD-10-CM | POA: Diagnosis not present

## 2015-10-13 DIAGNOSIS — Z85828 Personal history of other malignant neoplasm of skin: Secondary | ICD-10-CM | POA: Diagnosis not present

## 2015-10-13 DIAGNOSIS — Z7951 Long term (current) use of inhaled steroids: Secondary | ICD-10-CM | POA: Insufficient documentation

## 2015-10-13 DIAGNOSIS — I10 Essential (primary) hypertension: Secondary | ICD-10-CM | POA: Diagnosis not present

## 2015-10-13 DIAGNOSIS — R490 Dysphonia: Secondary | ICD-10-CM

## 2015-10-13 DIAGNOSIS — E785 Hyperlipidemia, unspecified: Secondary | ICD-10-CM | POA: Diagnosis not present

## 2015-10-13 DIAGNOSIS — R498 Other voice and resonance disorders: Secondary | ICD-10-CM | POA: Insufficient documentation

## 2015-10-13 DIAGNOSIS — Z973 Presence of spectacles and contact lenses: Secondary | ICD-10-CM | POA: Diagnosis not present

## 2015-10-13 DIAGNOSIS — Z7982 Long term (current) use of aspirin: Secondary | ICD-10-CM | POA: Diagnosis not present

## 2015-10-13 DIAGNOSIS — M810 Age-related osteoporosis without current pathological fracture: Secondary | ICD-10-CM | POA: Diagnosis not present

## 2015-10-13 DIAGNOSIS — I2510 Atherosclerotic heart disease of native coronary artery without angina pectoris: Secondary | ICD-10-CM | POA: Diagnosis not present

## 2015-10-13 DIAGNOSIS — R1310 Dysphagia, unspecified: Secondary | ICD-10-CM | POA: Diagnosis not present

## 2015-10-13 DIAGNOSIS — M1990 Unspecified osteoarthritis, unspecified site: Secondary | ICD-10-CM | POA: Diagnosis not present

## 2015-10-13 DIAGNOSIS — Z8543 Personal history of malignant neoplasm of ovary: Secondary | ICD-10-CM | POA: Diagnosis not present

## 2015-10-13 DIAGNOSIS — Z79899 Other long term (current) drug therapy: Secondary | ICD-10-CM | POA: Insufficient documentation

## 2015-10-13 DIAGNOSIS — Z8709 Personal history of other diseases of the respiratory system: Secondary | ICD-10-CM | POA: Diagnosis not present

## 2015-10-13 DIAGNOSIS — Z853 Personal history of malignant neoplasm of breast: Secondary | ICD-10-CM | POA: Diagnosis not present

## 2015-10-13 DIAGNOSIS — Z98811 Dental restoration status: Secondary | ICD-10-CM | POA: Insufficient documentation

## 2015-10-13 DIAGNOSIS — R0602 Shortness of breath: Secondary | ICD-10-CM | POA: Diagnosis not present

## 2015-10-13 DIAGNOSIS — R069 Unspecified abnormalities of breathing: Secondary | ICD-10-CM | POA: Diagnosis not present

## 2015-10-13 MED ORDER — ALBUTEROL SULFATE (2.5 MG/3ML) 0.083% IN NEBU
5.0000 mg | INHALATION_SOLUTION | Freq: Once | RESPIRATORY_TRACT | Status: AC
Start: 2015-10-13 — End: 2015-10-13
  Administered 2015-10-13: 5 mg via RESPIRATORY_TRACT
  Filled 2015-10-13: qty 6

## 2015-10-13 NOTE — ED Notes (Signed)
Bed: [id number] Expected date:  Expected time:  Means of arrival:  Comments: EMS- 81 yo F, SOB

## 2015-10-13 NOTE — ED Provider Notes (Signed)
CSN: [id number]     Arrival date & time 10/13/15  4097 History   First MD Initiated Contact with Patient 10/13/15 [phone]     Chief Complaint  Patient presents with  . Shortness of Breath     (Consider location/radiation/quality/duration/timing/severity/associated sxs/prior Treatment) HPI Comments: 81 year old female with past medical history including CAD, cardiomyopathy, hypertension, hyperlipidemia who presents with shortness of breath. Patient presented here on 10/01/15 for an episode of shortness of breath that began randomly. Workup at that time including serial troponins, CTA of the chest was negative. She has followed up with her cardiologist, who has an echo arranged in 2 days and gave her a Holter monitor for the past one week. The patient has continued to have paroxysms of shortness of breath at home, including one episode last night while bathing and again this morning while at rest. She was picked up by EMS and by the time she arrived here her symptoms had resolved, I her to receiving DuoNeb. She denies any current symptoms shortness of breath, although she endorses a "lump" feeling in her throat or her sternal notch. She endorses a recent history of occasional problems swallowing food. She has noticed it with solids and with liquids. She denies any vomiting. No cough or cold symptoms. The episode today felt exactly like the previous episodes for which she has had evaluation.  Of note, the patient has had chronic hoarseness for the past year. 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  Patient is a 81 y.o. female presenting with shortness of breath. The history is provided by the patient.  Shortness of Breath   Past Medical History  Diagnosis Date  . Hypertension   . Allergy   . Cataract   . CAD (coronary artery disease)   . Hyperlipidemia   . Cardiomyopathy ([place])     improved  . Arthritis   . Osteoporosis   . History of radiation  therapy 1994    left chest wall  . History of chemotherapy   . Wears glasses   . Wears dentures     full bottom-partial top  . Cancer ([place])   . Ovarian ca ([place])   . Breast CA ([place])   . Eccrine porocarcinoma of skin    Past Surgical History  Procedure Laterality Date  . Left breast mastectomy  1994    T3 N1 Mx  . Shoulder arthroscopy    . Right leg  1990    fx  . Shoulder surgery  1990    replacement-lt  . Modified radical mastectomy w/ axillary lymph node dissection w/ pectoralis minor excision  1994    Left  . Eye surgery      CATARACTS  . Abdominal hysterectomy  2004  . Cholecystectomy  1993  . Breast surgery    . Skin biopsy  04/27/2011    left hand, forearm and posterior arm  . Mass excision Left 05/09/2014    Procedure: EXCISION OF NODULE LEFT CHEST ;  Surgeon: Pedro Earls, MD;  Location: [facility];  Service: General;  Laterality: Left;   Family History  Problem Relation Age of Onset  . Breast cancer Sister     dx in her 42s  . Heart disease Sister   . Heart disease Father   . Heart disease Mother   . Stroke Mother   . Prostate cancer Maternal Uncle   . Stroke Maternal Grandmother   .  Testicular cancer Maternal Grandfather   . Stroke Maternal Grandfather   . Stroke Paternal Grandmother   . Heart disease Paternal Grandfather   . Breast cancer Sister 90    BRCA2+  . Breast cancer Sister 46  . Prostate cancer Maternal Uncle   . Breast cancer Other     maternal great aunt dx later in life   Social History  Substance Use Topics  . Smoking status: Never Smoker   . Smokeless tobacco: Never Used  . Alcohol Use: No   OB History    No data available     Review of Systems  Respiratory: Positive for shortness of breath.    10 Systems reviewed and are negative for acute change except as noted in the HPI.    Allergies  Ibuprofen and Morphine  Home Medications   Prior to Admission medications   Medication Sig Start Date End Date  Taking? Authorizing Provider  aspirin EC 81 MG tablet Take 81 mg by mouth at bedtime.     Historical Provider, MD  Calcium Carbonate-Vitamin D (CALCIUM + D PO) Take 600 mg by mouth daily after lunch.     Historical Provider, MD  carvedilol (COREG) 25 MG tablet TAKE ONE TABLET TWICE DAILY WITH FOOD 09/13/15   Marletta Lor, MD  Cholecalciferol (VITAMIN D) 2000 UNITS tablet Take 2,000 Units by mouth daily.    Historical Provider, MD  Coenzyme Q10 (CO Q 10) 100 MG CAPS Take 1 capsule by mouth daily.    Historical Provider, MD  denosumab (PROLIA) 60 MG/ML SOLN injection Inject 60 mg into the skin every 6 (six) months. Administer in upper arm, thigh, or abdomen    Historical Provider, MD  ferrous gluconate (FERGON) 324 MG tablet Take 324 mg by mouth daily with breakfast.  04/01/12   Lennis Marion Downer, MD  fluticasone (FLONASE) 50 MCG/ACT nasal spray Place 2 sprays into both nostrils daily. 08/30/15   Marletta Lor, MD  lisinopril-hydrochlorothiazide (ZESTORETIC) 20-12.5 MG tablet Take 1 tablet by mouth daily. 09/11/15   Marletta Lor, MD  loperamide (IMODIUM) 2 MG capsule Take 1 capsule (2 mg total) by mouth as needed for diarrhea or loose stools. 01/24/13   Lennis Marion Downer, MD  pantoprazole (PROTONIX) 40 MG tablet Take 1 tablet (40 mg total) by mouth daily. 10/04/15   Brett Canales, PA-C  simvastatin (ZOCOR) 40 MG tablet TAKE ONE TABLET DAILY AT BEDTIME 01/24/15   Marletta Lor, MD  traMADol (ULTRAM) 50 MG tablet TAKE ONE TABLET EVERY 6 HOURS AS NEEDED FOR PAIN 08/26/15   Marletta Lor, MD   BP 146/74 mmHg  Pulse 91  SpO2 100% Physical Exam  Constitutional: She is oriented to person, place, and time. She appears well-developed and well-nourished. No distress.  HENT:  Head: Normocephalic and atraumatic.  Mouth/Throat: Oropharynx is clear and moist.  Moist mucous membranes, hoarse voice  Eyes: Conjunctivae are normal. Pupils are equal, round, and reactive to light.   Neck: Neck supple.  Cardiovascular: Normal rate, regular rhythm and normal heart sounds.   No murmur heard. Pulmonary/Chest: Effort normal and breath sounds normal. No stridor. No respiratory distress. She has no wheezes.  Abdominal: Soft. Bowel sounds are normal. She exhibits no distension. There is no tenderness.  Musculoskeletal: She exhibits no edema.  Neurological: She is alert and oriented to person, place, and time.  Fluent speech  Skin: Skin is warm and dry.  Psychiatric: She has a normal mood and affect. Judgment  normal.  Nursing note and vitals reviewed.   ED Course  Procedures (including critical care time) Labs Review Labs Reviewed - No data to display  Imaging Review Dg Chest 2 View  10/13/2015  CLINICAL DATA:  Shortness of breath 2 days worse this morning. EXAM: CHEST  2 VIEW COMPARISON:  10/01/2015 and 01/27/2013 FINDINGS: Lungs are hyperexpanded without focal consolidation or effusion. Cardiomediastinal silhouette is within normal. There is calcified plaque over the thoracoabdominal aorta. Left shoulder prosthesis unchanged. Surgical clips over the left axilla and evidence of previous left mastectomy. Stable severe compression fracture over the lower thoracic spine. IMPRESSION: No acute cardiopulmonary disease per COPD. Stable severe compression fracture over the lower thoracic spine. Electronically Signed   By: Marin Olp M.D.   On: 10/13/2015 09:32     EKG Interpretation   Date/Time:  Sunday October 13 2015 09:01:22 EST Ventricular Rate:  89 PR Interval:  160 QRS Duration: 157 QT Interval:  464 QTC Calculation: 565 R Axis:   78 Text Interpretation:  Sinus rhythm Consider right atrial enlargement IVCD,  consider atypical LBBB Baseline wander in lead(s) II III aVF No  significant change since last tracing Confirmed by Teagan Heidrick MD, Moni Rothrock  ([id number]) on 10/13/2015 9:04:06 AM      MDM   Final diagnoses:  Shortness of breath  Hoarseness of voice  Dysphagia    Patient presents with paroxysmal shortness of breath episodes that occur randomly and are not associated with chest pain. Patient also notes some problems with dysphasia. On arrival by EMS, the patient was awake, alert, breathing comfortably and in no acute distress. O2 sat 98-100% on RA. No wheezing on exam and no stridor. EKG was unchanged from previous. Chest x-ray was negative for acute process however does re-demonstrate a compression fracture of her thoracic spine that was previously noted. Regarding the foreign body sensation in her throat and difficulty swallowing at times, I have recommended close follow-up with her PCP for gastroenterology referral as patient needs swallow study and possibly EGD. Regarding paroxysmal shortness of breath, the patient has had a negative PE study and is following with cardiology for Holter monitoring and echo this week. She denies any chest pain currently and I do not feel she needs any further cardiac workup at this time. I discussed symptoms with ENT, Dr. Benjamine Mola, given that patient has known vocal cord dysfunction. Given that patient does not have stridor on exam, his suspicion is low for acute vocal cord problem causing patient's shortness of breath. He has, however recommended follow-up either with him or Dr. Lucia Gaskins, patient's primary ENT, in the clinic for scope. On reexamination 2.5 hr after arrival, pt remains well appearing w/ normal VS and no respiratory sx. discussed follow-up plans with ENT and primary care provider for GI referral. Patient and her son voiced understanding and are in agreement with plan. Return precautions reviewed. Patient discharged in satisfactory condition.    Sharlett Iles, MD 10/13/15 [phone]

## 2015-10-14 ENCOUNTER — Telehealth: Payer: Self-pay | Admitting: Internal Medicine

## 2015-10-14 DIAGNOSIS — J310 Chronic rhinitis: Secondary | ICD-10-CM | POA: Diagnosis not present

## 2015-10-14 DIAGNOSIS — R1310 Dysphagia, unspecified: Secondary | ICD-10-CM

## 2015-10-14 DIAGNOSIS — R490 Dysphonia: Secondary | ICD-10-CM | POA: Diagnosis not present

## 2015-10-14 NOTE — Telephone Encounter (Signed)
Patient went to [facility] ER over the weekend for breathing problems and they recommend her seeing a gastro doctor because everytime she swollows everything seems to stop in her throat. She would like to know which gastro doctor Dr. Raliegh Ip would recommend her seeing.  Also the ER recommended her seeing the gastro doctor within three days.

## 2015-10-14 NOTE — Telephone Encounter (Signed)
Please schedule consultation with  GI due to dysphagia

## 2015-10-14 NOTE — Telephone Encounter (Signed)
Spoke to pt, told her referral to GI was done per Dr. Raliegh Ip and someone will be contacting you regarding an appt. Pt verbalized understanding.

## 2015-10-14 NOTE — Telephone Encounter (Signed)
Please see message and advise 

## 2015-10-16 ENCOUNTER — Ambulatory Visit (HOSPITAL_COMMUNITY): Payer: Medicare HMO | Attending: Cardiovascular Disease

## 2015-10-16 ENCOUNTER — Other Ambulatory Visit: Payer: Self-pay | Admitting: Internal Medicine

## 2015-10-16 ENCOUNTER — Telehealth: Payer: Self-pay | Admitting: Gastroenterology

## 2015-10-16 ENCOUNTER — Other Ambulatory Visit: Payer: Self-pay

## 2015-10-16 DIAGNOSIS — I351 Nonrheumatic aortic (valve) insufficiency: Secondary | ICD-10-CM | POA: Diagnosis not present

## 2015-10-16 DIAGNOSIS — R0600 Dyspnea, unspecified: Secondary | ICD-10-CM | POA: Diagnosis not present

## 2015-10-16 DIAGNOSIS — E785 Hyperlipidemia, unspecified: Secondary | ICD-10-CM | POA: Insufficient documentation

## 2015-10-16 DIAGNOSIS — I10 Essential (primary) hypertension: Secondary | ICD-10-CM | POA: Diagnosis not present

## 2015-10-16 NOTE — Telephone Encounter (Signed)
She can be offered an APP appt.  No earlier appts at this time with a MD.

## 2015-10-17 ENCOUNTER — Encounter: Payer: Self-pay | Admitting: Physician Assistant

## 2015-10-17 ENCOUNTER — Other Ambulatory Visit: Payer: Self-pay

## 2015-10-17 DIAGNOSIS — C569 Malignant neoplasm of unspecified ovary: Secondary | ICD-10-CM

## 2015-10-17 DIAGNOSIS — C4499 Other specified malignant neoplasm of skin, unspecified: Secondary | ICD-10-CM

## 2015-10-18 ENCOUNTER — Telehealth: Payer: Self-pay | Admitting: Cardiology

## 2015-10-18 ENCOUNTER — Encounter: Payer: Self-pay | Admitting: Gastroenterology

## 2015-10-18 NOTE — Telephone Encounter (Signed)
New Message   Pt calling for rn returning call

## 2015-10-18 NOTE — Telephone Encounter (Signed)
Pt returned Pam's call.  I discussed echo results w/ her, she verbalized understanding.

## 2015-10-19 ENCOUNTER — Other Ambulatory Visit: Payer: Self-pay | Admitting: Oncology

## 2015-10-21 ENCOUNTER — Telehealth: Payer: Self-pay | Admitting: Oncology

## 2015-10-21 ENCOUNTER — Encounter: Payer: Self-pay | Admitting: Oncology

## 2015-10-21 ENCOUNTER — Ambulatory Visit (HOSPITAL_BASED_OUTPATIENT_CLINIC_OR_DEPARTMENT_OTHER): Payer: Medicare HMO | Admitting: Oncology

## 2015-10-21 ENCOUNTER — Other Ambulatory Visit (HOSPITAL_BASED_OUTPATIENT_CLINIC_OR_DEPARTMENT_OTHER): Payer: Medicare HMO

## 2015-10-21 VITALS — BP 137/68 | HR 80 | Temp 97.7°F | Resp 18 | Ht 62.0 in | Wt 101.0 lb

## 2015-10-21 DIAGNOSIS — C4499 Other specified malignant neoplasm of skin, unspecified: Secondary | ICD-10-CM

## 2015-10-21 DIAGNOSIS — C44609 Unspecified malignant neoplasm of skin of left upper limb, including shoulder: Secondary | ICD-10-CM | POA: Diagnosis not present

## 2015-10-21 DIAGNOSIS — E8989 Other postprocedural endocrine and metabolic complications and disorders: Secondary | ICD-10-CM

## 2015-10-21 DIAGNOSIS — I890 Lymphedema, not elsewhere classified: Secondary | ICD-10-CM

## 2015-10-21 DIAGNOSIS — D500 Iron deficiency anemia secondary to blood loss (chronic): Secondary | ICD-10-CM

## 2015-10-21 DIAGNOSIS — J3801 Paralysis of vocal cords and larynx, unilateral: Secondary | ICD-10-CM

## 2015-10-21 DIAGNOSIS — C569 Malignant neoplasm of unspecified ovary: Secondary | ICD-10-CM

## 2015-10-21 DIAGNOSIS — R0602 Shortness of breath: Secondary | ICD-10-CM | POA: Diagnosis not present

## 2015-10-21 DIAGNOSIS — Z853 Personal history of malignant neoplasm of breast: Secondary | ICD-10-CM

## 2015-10-21 DIAGNOSIS — R634 Abnormal weight loss: Secondary | ICD-10-CM

## 2015-10-21 DIAGNOSIS — Z8543 Personal history of malignant neoplasm of ovary: Secondary | ICD-10-CM

## 2015-10-21 LAB — CBC WITH DIFFERENTIAL/PLATELET
BASO%: 1.4 % (ref 0.0–2.0)
BASOS ABS: 0.1 10*3/uL (ref 0.0–0.1)
EOS ABS: 0.2 10*3/uL (ref 0.0–0.5)
EOS%: 4.3 % (ref 0.0–7.0)
HCT: 32.4 % — ABNORMAL LOW (ref 34.8–46.6)
HGB: 11.3 g/dL — ABNORMAL LOW (ref 11.6–15.9)
LYMPH%: 19.5 % (ref 14.0–49.7)
MCH: 32.4 pg (ref 25.1–34.0)
MCHC: 35 g/dL (ref 31.5–36.0)
MCV: 92.6 fL (ref 79.5–101.0)
MONO#: 0.4 10*3/uL (ref 0.1–0.9)
MONO%: 11 % (ref 0.0–14.0)
NEUT%: 63.8 % (ref 38.4–76.8)
NEUTROS ABS: 2.4 10*3/uL (ref 1.5–6.5)
PLATELETS: 154 10*3/uL (ref 145–400)
RBC: 3.5 10*6/uL — AB (ref 3.70–5.45)
RDW: 14 % (ref 11.2–14.5)
WBC: 3.8 10*3/uL — AB (ref 3.9–10.3)
lymph#: 0.7 10*3/uL — ABNORMAL LOW (ref 0.9–3.3)

## 2015-10-21 LAB — COMPREHENSIVE METABOLIC PANEL
ALT: 21 U/L (ref 0–55)
ANION GAP: 11 meq/L (ref 3–11)
AST: 27 U/L (ref 5–34)
Albumin: 3.6 g/dL (ref 3.5–5.0)
Alkaline Phosphatase: 45 U/L (ref 40–150)
BILIRUBIN TOTAL: 1.47 mg/dL — AB (ref 0.20–1.20)
BUN: 10.9 mg/dL (ref 7.0–26.0)
CO2: 29 meq/L (ref 22–29)
Calcium: 10.1 mg/dL (ref 8.4–10.4)
Chloride: 90 mEq/L — ABNORMAL LOW (ref 98–109)
Creatinine: 1.2 mg/dL — ABNORMAL HIGH (ref 0.6–1.1)
EGFR: 42 mL/min/{1.73_m2} — AB (ref >90)
GLUCOSE: 98 mg/dL (ref 70–140)
POTASSIUM: 3.7 meq/L (ref 3.5–5.1)
SODIUM: 129 meq/L — AB (ref 136–145)
TOTAL PROTEIN: 5.9 g/dL — AB (ref 6.4–8.3)
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
## 2015-10-21 NOTE — Telephone Encounter (Signed)
Appointments made and avs printed for patient,central scheduling will call the patient

## 2015-10-22 ENCOUNTER — Encounter: Payer: Self-pay | Admitting: Oncology

## 2015-10-22 ENCOUNTER — Telehealth: Payer: Self-pay

## 2015-10-22 NOTE — Progress Notes (Signed)
Medical Oncology  Note of Information  Message received from radiology scheduling re requested swallowing study:  "This exam has to be scheduled with Rehab @ [phone]. This test requires a speech pathologist and we don't schedule this test.  Your office would need to call rehab at [phone] and set appointment with them and they will call us to schedule  Any questions please feel free to call me at [phone] "   MD called rehab [phone] now and LM for them to call back to RN.  If our office is able to schedule swallowing study will try to do that, otherwise will defer to other MDs. Patient has at least unilateral vocal cord paralysis and may be aspirating, with 2 recent ED visits. She is to see [facility] on 10-25-15 for 3 weeks of new difficulty swallowing. She is followed by Dr Radene Journey of ENT.  Godfrey Pick, MD

## 2015-10-22 NOTE — Telephone Encounter (Signed)
-----   Message from Gordy Levan, MD sent at 10/22/2015  1:06 PM EST ----- Please let her know  We are trying to get the swallowing study set up  She needs to drink 8-16 oz gatorade daily, as Na and chloride both low on labs yesterday.  She needs to start supplements at least 3 daily. Ensure pudding might be best for her to swallow. Be sure she knows to sit straight up when she is eating and swallowing.  thanks

## 2015-10-22 NOTE — Telephone Encounter (Signed)
Discussed drinking Gatorade and eating Ensure pudding as noted below by Dr. Marko Plume. Ms. Judy Herrera stated that she is set up for swallowing study on 10-25-15 at 1130 am.

## 2015-10-23 ENCOUNTER — Other Ambulatory Visit: Payer: Self-pay | Admitting: Internal Medicine

## 2015-10-23 ENCOUNTER — Other Ambulatory Visit: Payer: Self-pay | Admitting: Oncology

## 2015-10-24 NOTE — Telephone Encounter (Signed)
ok 

## 2015-10-24 NOTE — Telephone Encounter (Signed)
Pt last visit 09/11/15 Pt last Rx refill 08/26/15 #60 with 2 refills    Please advise

## 2015-10-25 ENCOUNTER — Encounter: Payer: Self-pay | Admitting: Physician Assistant

## 2015-10-25 ENCOUNTER — Ambulatory Visit (HOSPITAL_COMMUNITY): Payer: Medicare HMO

## 2015-10-25 ENCOUNTER — Ambulatory Visit (INDEPENDENT_AMBULATORY_CARE_PROVIDER_SITE_OTHER): Payer: Medicare HMO | Admitting: Physician Assistant

## 2015-10-25 VITALS — BP 138/70 | HR 72 | Ht 62.0 in | Wt 103.0 lb

## 2015-10-25 DIAGNOSIS — R1310 Dysphagia, unspecified: Secondary | ICD-10-CM

## 2015-10-25 NOTE — Patient Instructions (Signed)
You have been scheduled for a Barium Esophogram at [facility] on 10-28-2015 at 830am. Please arrive 15 minutes prior to your appointment for registration. Make certain not to have anything to eat or drink 6 hours prior to your test. If you need to reschedule for any reason, please contact radiology at [phone] to do so. __________________________________________________________________ A barium swallow is an examination that concentrates on views of the esophagus. This tends to be a double contrast exam (barium and two liquids which, when combined, create a gas to distend the wall of the oesophagus) or single contrast (non-ionic iodine based). The study is usually tailored to your symptoms so a good history is essential. Attention is paid during the study to the form, structure and configuration of the esophagus, looking for functional disorders (such as aspiration, dysphagia, achalasia, motility and reflux) EXAMINATION You may be asked to change into a gown, depending on the type of swallow being performed. A radiologist and radiographer will perform the procedure. The radiologist will advise you of the type of contrast selected for your procedure and direct you during the exam. You will be asked to stand, sit or lie in several different positions and to hold a small amount of fluid in your mouth before being asked to swallow while the imaging is performed .In some instances you may be asked to swallow barium coated marshmallows to assess the motility of a solid food bolus. The exam can be recorded as a digital or video fluoroscopy procedure. POST PROCEDURE It will take 1-2 days for the barium to pass through your system. To facilitate this, it is important, unless otherwise directed, to increase your fluids for the next 24-48hrs and to resume your normal diet.  This test typically takes about 30 minutes to  perform. __________________________________________________________________________________  Judy Herrera have been scheduled for an endoscopy. Please follow written instructions given to you at your visit today. If you use inhalers (even only as needed), please bring them with you on the day of your procedure. Your physician has requested that you go to www.startemmi.com and enter the access code given to you at your visit today. This web site gives a general overview about your procedure. However, you should still follow specific instructions given to you by our office regarding your preparation for the procedure.

## 2015-10-25 NOTE — Progress Notes (Signed)
Patient ID: Judy Herrera, female   DOB: 26-Jan-1931, 81 y.o.   MRN: [id number]    HPI:  Judy Herrera is a 81 y.o.   female  referred by Marletta Lor, MD for evaluation of dysphagia. Judy Herrera has a rather complex history. She has a history of poor rectal carcinoma involving the left upper extremity, node-positive left breast cancer in 1994, and ovarian cancer in 2004. She has a sister who was BRCA2 positive but Judy Herrera has tested negative for BRCA. She has a history of hypertension, cataracts, coronary artery disease, cardiomyopathy, hyperlipidemia, osteoarthritis, osteoporosis, diverticulosis, left bundle branch block, COPD, and thrombocytopenia. She has been evaluated by Dr. Deatra Ina in the past and had a colonoscopy on 02/10/2011 due to a history of polyps. She was noted to have mild diverticulosis in the sigmoid and a diverticula in the ascending colon. No follow-up was recommended.  Judy Herrera has a history of left vocal cord paralysis since 2015. 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 She was seen in the emergency room twice in November 2016 for shortness of breath. CT was negative for PE. Cardiac workup was nonrevealing. She reports that she has been having difficulty swallowing solids and occasionally liquids for several months. She has not had to spit food up. She denies complaints of heartburn or regurgitation. She denies complaints of epigastric pain, nausea, or early satiety. She does not feel as if food is pooling in her throat, rather she feels it is getting stuck in the mid chest.   Past Medical History  Diagnosis Date  . Hypertension   . Allergy   . Cataract   . CAD (coronary artery disease)   . Hyperlipidemia   . Cardiomyopathy ([place])     improved  . Arthritis   . Osteoporosis   . History of radiation therapy 1994    left chest wall  . History of chemotherapy   . Wears glasses   . Wears dentures     full  bottom-partial top  . Cancer ([place])   . Ovarian ca ([place])   . Breast CA ([place])   . Eccrine porocarcinoma of skin   . Diverticulosis     Past Surgical History  Procedure Laterality Date  . Left breast mastectomy  1994    T3 N1 Mx  . Shoulder arthroscopy    . Right leg  1990    fx  . Shoulder surgery  1990    replacement-lt  . Modified radical mastectomy w/ axillary lymph node dissection w/ pectoralis minor excision  1994    Left  . Eye surgery      CATARACTS  . Abdominal hysterectomy  2004  . Cholecystectomy  1993  . Breast surgery    . Skin biopsy  04/27/2011    left hand, forearm and posterior arm  . Mass excision Left 05/09/2014    Procedure: EXCISION OF NODULE LEFT CHEST ;  Surgeon: Pedro Earls, MD;  Location: [facility];  Service: General;  Laterality: Left;   Family History  Problem Relation Age of Onset  . Breast cancer Sister     dx in her 26s  . Heart disease Sister   . Heart disease Father   . Heart disease Mother   . Stroke Mother   . Prostate cancer Maternal Uncle   . Stroke Maternal Grandmother   . Testicular cancer Maternal Grandfather   . Stroke Maternal Grandfather   .  Stroke Paternal Grandmother   . Heart disease Paternal Grandfather   . Breast cancer Sister 62    BRCA2+  . Breast cancer Sister 49  . Prostate cancer Maternal Uncle   . Breast cancer Other     maternal great aunt dx later in life   Social History  Substance Use Topics  . Smoking status: Never Smoker   . Smokeless tobacco: Never Used  . Alcohol Use: No   Current Outpatient Prescriptions  Medication Sig Dispense Refill  . aspirin EC 81 MG tablet Take 81 mg by mouth at bedtime.     . calcium carbonate (TUMS - DOSED IN MG ELEMENTAL CALCIUM) 500 MG chewable tablet Chew 2 tablets by mouth daily with supper.    . carvedilol (COREG) 25 MG tablet TAKE ONE TABLET TWICE DAILY WITH FOOD 180 tablet 1  . Cholecalciferol (VITAMIN D) 2000 UNITS tablet Take 2,000 Units by mouth  every morning.     . Coenzyme Q10 (CO Q 10) 100 MG CAPS Take 1 capsule by mouth every morning.     . denosumab (PROLIA) 60 MG/ML SOLN injection Inject 60 mg into the skin every 6 (six) months. Administer in upper arm, thigh, or abdomen    . ferrous gluconate (FERGON) 324 MG tablet Take 324 mg by mouth daily before breakfast. WITH GLASS OF ORANGE JUICE    . fluticasone (FLONASE) 50 MCG/ACT nasal spray Place 2 sprays into both nostrils daily. 16 g 5  . lisinopril-hydrochlorothiazide (ZESTORETIC) 20-12.5 MG tablet Take 1 tablet by mouth daily. 90 tablet 3  . loperamide (IMODIUM) 2 MG capsule Take 1 capsule (2 mg total) by mouth as needed for diarrhea or loose stools. 20 capsule 0  . pantoprazole (PROTONIX) 40 MG tablet Take 1 tablet (40 mg total) by mouth daily. 90 tablet 1  . simvastatin (ZOCOR) 40 MG tablet TAKE ONE TABLET DAILY AT BEDTIME 90 tablet 3  . traMADol (ULTRAM) 50 MG tablet TAKE 1 TABLET EVERY 6 HOURS AS NEEDED FOR PAIN 60 tablet 1   No current facility-administered medications for this visit.   Allergies  Allergen Reactions  . Ibuprofen Nausea Only  . Morphine Nausea And Vomiting     Review of Systems: Gen: Denies any fever, chills, sweats, anorexia, fatigue, weakness, malaise, weight loss, and sleep disorder CV: Denies chest pain, angina, palpitations, syncope, orthopnea, PND, peripheral edema, and claudication. Resp: Denies dyspnea at rest, dyspnea with exercise, cough, sputum, wheezing, coughing up blood, and pleurisy. GI: Denies vomiting blood, jaundice, and fecal incontinence.   Admits to dysphagia to solids. GU : Denies urinary burning, blood in urine, urinary frequency, urinary hesitancy, nocturnal urination, and urinary incontinence. MS: Denies joint pain, limitation of movement, and swelling, stiffness, low back pain, extremity pain. Denies muscle weakness, cramps, atrophy.  Derm: Denies rash, itching, dry skin, hives, moles, warts, or unhealing ulcers.  Psych: Denies  depression, anxiety, memory loss, suicidal ideation, hallucinations, paranoia, and confusion. Heme: Denies bruising, bleeding, and enlarged lymph nodes. Neuro:  Denies any headaches, dizziness, paresthesias. Endo:  Denies any problems with DM, thyroid, adrenal function  Studies: Dg Chest 2 View  10/13/2015  CLINICAL DATA:  Shortness of breath 2 days worse this morning. EXAM: CHEST  2 VIEW COMPARISON:  10/01/2015 and 01/27/2013 FINDINGS: Lungs are hyperexpanded without focal consolidation or effusion. Cardiomediastinal silhouette is within normal. There is calcified plaque over the thoracoabdominal aorta. Left shoulder prosthesis unchanged. Surgical clips over the left axilla and evidence of previous left mastectomy. Stable severe compression  fracture over the lower thoracic spine. IMPRESSION: No acute cardiopulmonary disease per COPD. Stable severe compression fracture over the lower thoracic spine. Electronically Signed   By: Marin Olp M.D.   On: 10/13/2015 09:32   Dg Chest 2 View  10/01/2015  CLINICAL DATA:  81 year old female with difficulty breathing for the past 2-3 weeks. Worsening shortness of breath this morning. EXAM: CHEST  2 VIEW COMPARISON:  Chest x-ray 01/27/2013. FINDINGS: Lung volumes are normal. No consolidative airspace disease. No pleural effusions. No pneumothorax. No pulmonary nodule or mass noted. Pulmonary vasculature and the cardiomediastinal silhouette are within normal limits. Mild scarring in the apices of the thorax bilaterally (left greater than right), presumably related to remote infection. Atherosclerotic calcifications in the thoracic aorta. Surgical clips in the left axilla from prior lymph node dissection. Status post left shoulder arthroplasty. Densely calcified left second costochondral cartilage again noted. Lateral projection demonstrates a chronic compression fracture of T12 with a vertebra plana appearance (unchanged). IMPRESSION: 1. No radiographic evidence of  acute cardiopulmonary disease. 2. Atherosclerosis. 3. Additional incidental findings, as above. Electronically Signed   By: [name] M.D.   On: 10/01/2015 11:12   Ct Angio Chest Pe W/cm &/or Wo Cm  10/01/2015  CLINICAL DATA:  Difficulty breathing for 2-3 weeks, more shorter breath this morning, question pulmonary embolism, personal history of hypertension, coronary artery disease, breast cancer, ovarian cancer EXAM: CT ANGIOGRAPHY CHEST WITH CONTRAST TECHNIQUE: Multidetector CT imaging of the chest was performed using the standard protocol during bolus administration of intravenous contrast. Multiplanar CT image reconstructions and MIPs were obtained to evaluate the vascular anatomy. CONTRAST:  12m OMNIPAQUE IOHEXOL 350 MG/ML SOLN IV COMPARISON:  01/07/2015 FINDINGS: Extensive atherosclerotic calcification aorta, proximal great vessels and coronary arteries. Aneurysmal dilatation ascending thoracic aorta 4.0 cm transverse image 43. Scattered beam hardening artifacts from orthopedic hardware proximal LEFT humerus. Enlargement of RIGHT pulmonary artery question pulmonary arterial hypertension. Pulmonary arteries well opacified and patent. No evidence of pulmonary embolism. Post cholecystectomy. BILATERAL renal cortical atrophy. Scarring and volume loss in medial LEFT upper lobe. Remaining lungs hyperinflated but clear. No pleural effusion or pneumothorax. Bones diffusely demineralized with marked compression deformity/vertebra plana at T12 unchanged, associated with mild focal kyphosis and mild retropulsion of fragments, unchanged. Review of the MIP images confirms the above findings. IMPRESSION: No evidence of pulmonary embolism. Question pulmonary arterial hypertension. Hyperinflated lungs with medial LEFT upper lobe scarring, stable. No acute abnormalities. Chronic marked T12 compression fracture. Extensive atherosclerotic disease with aneurysmal dilatation ascending thoracic aorta, recommendation  below. Recommend annual imaging followup by CTA or MRA. This recommendation follows 2010 ACCF/AHA/AATS/ACR/ASA/SCA/SCAI/SIR/STS/SVM Guidelines for the Diagnosis and Management of Patients with Thoracic Aortic Disease. Circulation. 2010; 121:: [id number] Signed   By: MLavonia DanaM.D.   On: 10/01/2015 14:06   Labs: CBC December 15,016 WBC 3.8, hemoglobin 11.3, hematocrit 32.4, platelet 254,000, MCV 92.6.  Prior Endoscopies:  See history of present illness  Physical Exam: BP 138/70 mmHg  Pulse 72  Ht 5' 2"  (1.575 m)  Wt 103 lb (46.72 kg)  BMI 18.83 kg/m2 Constitutional: Pleasant,well-developed, Caucasian female in no acute distress. HEENT: Normocephalic and atraumatic. Conjunctivae are normal. No scleral icterus. Neck supple. No JVD Cardiovascular: Normal rate, regular rhythm.  Pulmonary/chest: Effort normal and breath sounds normal. No wheezing, rales or rhonchi. Kyphosis Abdominal: Soft, nondistended, nontender. Bowel sounds active throughout. There are no masses palpable. No hepatomegaly. Extremities: no edema Lymphadenopathy: No cervical adenopathy noted. Neurological: Alert and oriented to person place and  time. Skin: Skin is warm and dry. No rashes noted. Psychiatric: Normal mood and affect. Behavior is normal.  ASSESSMENT AND PLAN: 81 year old female with a past medical history of portal carcinoma, breast cancer, and ovarian cancer, status post radiation in the remote past, presenting with a several month history of progressive dysphagia to solids. Patient has been instructed to cut her food up very small and to chew thoroughly. She will be scheduled for a barium swallow with tablet. She will then be scheduled for an EGD with possible dilation.The risks, benefits, and alternatives to endoscopy with possible biopsy and possible dilation were discussed with the patient and they consent to proceed.  The procedure will be scheduled with Dr. Havery Moros. Further recommendations  will be made pending the findings of the above.    Tuvia Woodrick, Deloris Ping 10/25/2015, 4:01 PM  CC: Marletta Lor, MD

## 2015-10-25 NOTE — Telephone Encounter (Signed)
ok 

## 2015-10-26 NOTE — Progress Notes (Signed)
Agree with assessment and plan. I would first await barium swallow prior to scheduling / proceeding with EGD. She has a history of vocal cord paralysis and these symptoms could represent oropharyngeal dysphagia. If barium swallow suggests oropharyngeal dysphagia may need speech path evaluation and not EGD. If the study is normal or suggests esophageal pathology will then proceed with EGD.

## 2015-10-28 ENCOUNTER — Telehealth: Payer: Self-pay | Admitting: *Deleted

## 2015-10-28 ENCOUNTER — Other Ambulatory Visit (HOSPITAL_COMMUNITY): Payer: Self-pay | Admitting: Physician Assistant

## 2015-10-28 ENCOUNTER — Ambulatory Visit (HOSPITAL_COMMUNITY)
Admission: RE | Admit: 2015-10-28 | Discharge: 2015-10-28 | Disposition: A | Discharge status: Home / Self Care | Payer: Medicare HMO | Source: Ambulatory Visit | Attending: Physician Assistant | Admitting: Physician Assistant

## 2015-10-28 DIAGNOSIS — R1310 Dysphagia, unspecified: Secondary | ICD-10-CM

## 2015-10-28 DIAGNOSIS — R918 Other nonspecific abnormal finding of lung field: Secondary | ICD-10-CM | POA: Insufficient documentation

## 2015-10-28 DIAGNOSIS — J3801 Paralysis of vocal cords and larynx, unilateral: Secondary | ICD-10-CM | POA: Diagnosis not present

## 2015-10-28 NOTE — Telephone Encounter (Signed)
Left a message with Speech patlh to call to set up appointment. Patient aware and cancelled procedure.

## 2015-10-28 NOTE — Telephone Encounter (Signed)
-----   Message from Vita Barley Hvozdovic, PA-C sent at 10/28/2015  1:27 PM EST ----- Rollene Fare, Pt had esophogram tha shows some compression of esphagus from lungs. Dr Havery Moros would like her to have a modified barium swallow with speech pathology. Her note says she was scheduled for EGD but I dont see a date, and dont know if they scheduled it at hospital or [place]. Cancel EGD for now, she needs modified barium swallow with speech pathology. Thanks.

## 2015-10-28 NOTE — Telephone Encounter (Signed)
Spoke with speech and scheduled patient at [facility] on 11/14/15 at 1:00 PM. First floor radiology. Patient aware.

## 2015-10-28 NOTE — Telephone Encounter (Signed)
-----   Message from Vita Barley Hvozdovic, PA-C sent at 10/28/2015  1:27 PM EST ----- Rollene Fare, Pt had esophogram tha shows some compression of esphagus from lungs. Dr Havery Moros would like her to have a modified barium swallow with speech pathology. Her note says she was scheduled for EGD but I dont see a date, and dont know if they scheduled it at hospital or [place]. Cancel EGD for now, she needs modified barium swallow with speech pathology. Thanks.

## 2015-10-30 ENCOUNTER — Encounter: Payer: Self-pay | Admitting: Cardiology

## 2015-10-30 ENCOUNTER — Ambulatory Visit (INDEPENDENT_AMBULATORY_CARE_PROVIDER_SITE_OTHER): Payer: Medicare HMO | Admitting: Cardiology

## 2015-10-30 VITALS — BP 104/56 | HR 75 | Ht 62.0 in | Wt 102.4 lb

## 2015-10-30 DIAGNOSIS — I712 Thoracic aortic aneurysm, without rupture: Secondary | ICD-10-CM | POA: Diagnosis not present

## 2015-10-30 DIAGNOSIS — I2510 Atherosclerotic heart disease of native coronary artery without angina pectoris: Secondary | ICD-10-CM | POA: Diagnosis not present

## 2015-10-30 DIAGNOSIS — I7121 Aneurysm of the ascending aorta, without rupture: Secondary | ICD-10-CM

## 2015-10-30 DIAGNOSIS — I420 Dilated cardiomyopathy: Secondary | ICD-10-CM

## 2015-10-30 DIAGNOSIS — I10 Essential (primary) hypertension: Secondary | ICD-10-CM | POA: Diagnosis not present

## 2015-10-30 DIAGNOSIS — E785 Hyperlipidemia, unspecified: Secondary | ICD-10-CM

## 2015-10-30 NOTE — Patient Instructions (Signed)
Dr Stanford Breed has made no changes today in your current medications or treatment plan.  Your physician recommends that you schedule a follow-up appointment in 6 months. You will receive a reminder letter in the mail two months in advance. If you don't receive a letter, please call our office to schedule the follow-up appointment.  If you need a refill on your cardiac medications before your next appointment, please call your pharmacy.

## 2015-10-30 NOTE — Assessment & Plan Note (Signed)
Continue aspirin and statin. 

## 2015-10-30 NOTE — Assessment & Plan Note (Signed)
Continue statin. 

## 2015-10-30 NOTE — Assessment & Plan Note (Signed)
Follow-up CTA November 2017.

## 2015-10-30 NOTE — Assessment & Plan Note (Signed)
LV function improved on most recent echo. Continue beta blocker and ACE inhibitor. 

## 2015-10-30 NOTE — Progress Notes (Signed)
HPI: FU cardiomyopathy out of proportion to coronary artery disease. She did have a cardiac catheterization performed on August 18, 2005. At that time, she had a 60% OM, but otherwise nonobstructive coronary artery disease. CTA 11/16 showed no pulmonary embolus, thoracic aorta 4 cm. Echo 11/16 showed normal LV function, grade 1 diastolic dysfunction, mild AI, mild MR. Event monitor 11/16 showed sinus with pvcs and brief NSVT. Since last seen the patient has dyspnea with more extreme activities but not with routine activities. It is relieved with rest. It is not associated with chest pain. There is no orthopnea, PND or pedal edema. There is no syncope or palpitations. There is no exertional chest pain.   Current Outpatient Prescriptions  Medication Sig Dispense Refill  . aspirin EC 81 MG tablet Take 81 mg by mouth at bedtime.     . calcium carbonate (TUMS - DOSED IN MG ELEMENTAL CALCIUM) 500 MG chewable tablet Chew 2 tablets by mouth daily with supper.    . carvedilol (COREG) 25 MG tablet TAKE ONE TABLET TWICE DAILY WITH FOOD 180 tablet 1  . Cholecalciferol (VITAMIN D) 2000 UNITS tablet Take 2,000 Units by mouth every morning.     . Coenzyme Q10 (CO Q 10) 100 MG CAPS Take 1 capsule by mouth every morning.     . denosumab (PROLIA) 60 MG/ML SOLN injection Inject 60 mg into the skin every 6 (six) months. Administer in upper arm, thigh, or abdomen    . ferrous gluconate (FERGON) 324 MG tablet Take 324 mg by mouth daily before breakfast. WITH GLASS OF ORANGE JUICE    . fluticasone (FLONASE) 50 MCG/ACT nasal spray Place 2 sprays into both nostrils daily. 16 g 5  . lisinopril-hydrochlorothiazide (ZESTORETIC) 20-12.5 MG tablet Take 1 tablet by mouth daily. 90 tablet 3  . loperamide (IMODIUM) 2 MG capsule Take 1 capsule (2 mg total) by mouth as needed for diarrhea or loose stools. 20 capsule 0  . pantoprazole (PROTONIX) 40 MG tablet Take 1 tablet (40 mg total) by mouth daily. 90 tablet 1  .  simvastatin (ZOCOR) 40 MG tablet TAKE ONE TABLET DAILY AT BEDTIME 90 tablet 3  . traMADol (ULTRAM) 50 MG tablet TAKE 1 TABLET EVERY 6 HOURS AS NEEDED FOR PAIN 60 tablet 1   No current facility-administered medications for this visit.     Past Medical History  Diagnosis Date  . Hypertension   . Allergy   . Cataract   . CAD (coronary artery disease)   . Hyperlipidemia   . Cardiomyopathy ([place])     improved  . Arthritis   . Osteoporosis   . History of radiation therapy 1994    left chest wall  . History of chemotherapy   . Wears glasses   . Wears dentures     full bottom-partial top  . Cancer ([place])   . Ovarian ca ([place])   . Breast CA ([place])   . Eccrine porocarcinoma of skin   . Diverticulosis     Past Surgical History  Procedure Laterality Date  . Left breast mastectomy  1994    T3 N1 Mx  . Shoulder arthroscopy    . Right leg  1990    fx  . Shoulder surgery  1990    replacement-lt  . Modified radical mastectomy w/ axillary lymph node dissection w/ pectoralis minor excision  1994    Left  . Eye surgery      CATARACTS  . Abdominal hysterectomy  2004  .  Cholecystectomy  1993  . Breast surgery    . Skin biopsy  04/27/2011    left hand, forearm and posterior arm  . Mass excision Left 05/09/2014    Procedure: EXCISION OF NODULE LEFT CHEST ;  Surgeon: Pedro Earls, MD;  Location: [facility];  Service: General;  Laterality: Left;    Social History   Social History  . Marital Status: Widowed    Spouse Name: N/A  . Number of Children: 1  . Years of Education: N/A   Occupational History  . Not on file.   Social History Main Topics  . Smoking status: Never Smoker   . Smokeless tobacco: Never Used  . Alcohol Use: No  . Drug Use: No  . Sexual Activity: Not on file     Comment: didn't ask   Other Topics Concern  . Not on file   Social History Narrative    ROS: no fevers or chills, productive cough, hemoptysis, dysphasia, odynophagia, melena,  hematochezia, dysuria, hematuria, rash, seizure activity, orthopnea, PND, pedal edema, claudication. Remaining systems are negative.  Physical Exam: Well-developed well-nourished in no acute distress.  Skin is warm and dry.  HEENT is normal.  Neck is supple.  Chest with diminished BS throughout Cardiovascular exam is regular rate and rhythm.  Abdominal exam nontender or distended. No masses palpated. Extremities show no edema. neuro grossly intact

## 2015-10-30 NOTE — Assessment & Plan Note (Signed)
Blood pressure controlled. Continue present medications. 

## 2015-11-05 ENCOUNTER — Encounter: Payer: Medicare HMO | Admitting: Gastroenterology

## 2015-11-05 ENCOUNTER — Other Ambulatory Visit (HOSPITAL_COMMUNITY): Payer: Medicare HMO

## 2015-11-07 ENCOUNTER — Ambulatory Visit: Payer: Medicare HMO | Admitting: Cardiology

## 2015-11-14 ENCOUNTER — Ambulatory Visit (HOSPITAL_COMMUNITY)
Admission: RE | Admit: 2015-11-14 | Discharge: 2015-11-14 | Disposition: A | Discharge status: Home / Self Care | Payer: Medicare HMO | Source: Ambulatory Visit | Attending: Physician Assistant | Admitting: Physician Assistant

## 2015-11-14 DIAGNOSIS — K224 Dyskinesia of esophagus: Secondary | ICD-10-CM | POA: Diagnosis not present

## 2015-11-14 DIAGNOSIS — R1310 Dysphagia, unspecified: Secondary | ICD-10-CM

## 2015-11-27 ENCOUNTER — Encounter: Payer: Self-pay | Admitting: *Deleted

## 2015-11-27 ENCOUNTER — Emergency Department
Admission: EM | Admit: 2015-11-27 | Discharge: 2015-11-27 | Disposition: A | Discharge status: Home / Self Care | Payer: Medicare HMO | Source: Home / Self Care | Attending: Family Medicine | Admitting: Family Medicine

## 2015-11-27 DIAGNOSIS — J029 Acute pharyngitis, unspecified: Secondary | ICD-10-CM | POA: Diagnosis not present

## 2015-11-27 LAB — POCT RAPID STREP A (OFFICE): RAPID STREP A SCREEN: NEGATIVE

## 2015-11-27 NOTE — Discharge Instructions (Signed)
If increasing cold symptoms develop, try the following: Take plain guaifenesin (1200mg  extended release tabs such as Mucinex) twice daily, with plenty of water, for cough and congestion. Get adequate rest.    Also recommend using saline nasal spray several times daily and saline nasal irrigation (AYR is a common brand).   Continue Flonase nasal spray. Try warm salt water gargles for sore throat.  Stop all antihistamines for now, and other non-prescription cough/cold preparations. May take Delsym Cough Suppressant at bedtime for nighttime cough.  Follow-up with family doctor if not improving about 7 to10 days.

## 2015-11-27 NOTE — ED Provider Notes (Signed)
CSN: [id number]     Arrival date & time 11/27/15  0849 History   First MD Initiated Contact with Patient 11/27/15 0919     Chief Complaint  Patient presents with  . Sore Throat      HPI Comments: Patient complains of onset of a sore throat three days ago that has persisted.  She had some chills initially but did not feel ill.  She has also developed a very mild cough.  States that she normally has nasal congestion which has not become worse.  No fevers, chills, and sweats  The history is provided by the patient.    Past Medical History  Diagnosis Date  . Hypertension   . Allergy   . Cataract   . CAD (coronary artery disease)   . Hyperlipidemia   . Cardiomyopathy ([place])     improved  . Arthritis   . Osteoporosis   . History of radiation therapy 1994    left chest wall  . History of chemotherapy   . Wears glasses   . Wears dentures     full bottom-partial top  . Cancer ([place])   . Ovarian ca ([place])   . Breast CA ([place])   . Eccrine porocarcinoma of skin   . Diverticulosis    Past Surgical History  Procedure Laterality Date  . Left breast mastectomy  1994    T3 N1 Mx  . Shoulder arthroscopy    . Right leg  1990    fx  . Shoulder surgery  1990    replacement-lt  . Modified radical mastectomy w/ axillary lymph node dissection w/ pectoralis minor excision  1994    Left  . Eye surgery      CATARACTS  . Abdominal hysterectomy  2004  . Cholecystectomy  1993  . Breast surgery    . Skin biopsy  04/27/2011    left hand, forearm and posterior arm  . Mass excision Left 05/09/2014    Procedure: EXCISION OF NODULE LEFT CHEST ;  Surgeon: Pedro Earls, MD;  Location: [facility];  Service: General;  Laterality: Left;   Family History  Problem Relation Age of Onset  . Breast cancer Sister     dx in her 60s  . Heart disease Sister   . Heart disease Father   . Heart disease Mother   . Stroke Mother   . Prostate cancer Maternal Uncle   . Stroke Maternal  Grandmother   . Testicular cancer Maternal Grandfather   . Stroke Maternal Grandfather   . Stroke Paternal Grandmother   . Heart disease Paternal Grandfather   . Breast cancer Sister 58    BRCA2+  . Breast cancer Sister 70  . Prostate cancer Maternal Uncle   . Breast cancer Other     maternal great aunt dx later in life   Social History  Substance Use Topics  . Smoking status: Never Smoker   . Smokeless tobacco: Never Used  . Alcohol Use: No   OB History    No data available     Review of Systems + sore throat + cough No pleuritic pain No wheezing + nasal congestion + post-nasal drainage No sinus pain/pressure No itchy/red eyes No earache No hemoptysis No SOB No fever, + chills No nausea No vomiting No abdominal pain No diarrhea No urinary symptoms No skin rash + fatigue No myalgias No headache    Allergies  Ibuprofen and Morphine  Home Medications   Prior to Admission medications  Medication Sig Start Date End Date Taking? Authorizing Provider  aspirin EC 81 MG tablet Take 81 mg by mouth at bedtime.     Historical Provider, MD  calcium carbonate (TUMS - DOSED IN MG ELEMENTAL CALCIUM) 500 MG chewable tablet Chew 2 tablets by mouth daily with supper.    Historical Provider, MD  carvedilol (COREG) 25 MG tablet TAKE ONE TABLET TWICE DAILY WITH FOOD 09/13/15   Marletta Lor, MD  Cholecalciferol (VITAMIN D) 2000 UNITS tablet Take 2,000 Units by mouth every morning.     Historical Provider, MD  Coenzyme Q10 (CO Q 10) 100 MG CAPS Take 1 capsule by mouth every morning.     Historical Provider, MD  denosumab (PROLIA) 60 MG/ML SOLN injection Inject 60 mg into the skin every 6 (six) months. Administer in upper arm, thigh, or abdomen    Historical Provider, MD  ferrous gluconate (FERGON) 324 MG tablet Take 324 mg by mouth daily before breakfast. WITH GLASS OF ORANGE JUICE 04/01/12   Lennis Marion Downer, MD  fluticasone (FLONASE) 50 MCG/ACT nasal spray Place 2 sprays  into both nostrils daily. 08/30/15   Marletta Lor, MD  lisinopril-hydrochlorothiazide (ZESTORETIC) 20-12.5 MG tablet Take 1 tablet by mouth daily. 09/11/15   Marletta Lor, MD  loperamide (IMODIUM) 2 MG capsule Take 1 capsule (2 mg total) by mouth as needed for diarrhea or loose stools. 01/24/13   Lennis Marion Downer, MD  pantoprazole (PROTONIX) 40 MG tablet Take 1 tablet (40 mg total) by mouth daily. 10/04/15   Brett Canales, PA-C  simvastatin (ZOCOR) 40 MG tablet TAKE ONE TABLET DAILY AT BEDTIME 10/16/15   Marletta Lor, MD  traMADol (ULTRAM) 50 MG tablet TAKE 1 TABLET EVERY 6 HOURS AS NEEDED FOR PAIN 10/25/15   Marletta Lor, MD   Meds Ordered and Administered this Visit  Medications - No data to display  BP 138/73 mmHg  Pulse 80  Temp(Src) 98 F (36.7 C) (Oral)  Resp 16  Ht 5' 2"  (1.575 m)  Wt 195 lb (88.451 kg)  BMI 35.66 kg/m2  SpO2 98% No data found.   Physical Exam Nursing notes and Vital Signs reviewed. Appearance:  Patient appears stated age, and in no acute distress Eyes:  Pupils are equal, round, and reactive to light and accomodation.  Extraocular movement is intact.  Conjunctivae are not inflamed  Ears:  Canals normal.  Tympanic membranes normal.  Nose:  Congested turbinates.  No sinus tenderness.    Pharynx:  Normal Neck:  Supple.  Mildly tender enlarged posterior nodes are palpated bilaterally  Lungs:  Clear to auscultation.  Breath sounds are equal.  Moving air well. Heart:  Regular rate and rhythm without murmurs, rubs, or gallops.  Abdomen:  Nontender without masses or hepatosplenomegaly.  Bowel sounds are present.  No CVA or flank tenderness.  Extremities:  No edema.  Skin:  No rash present.   ED Course  Procedures none    Labs Reviewed  POCT RAPID STREP A (OFFICE) negative     MDM   1. Acute pharyngitis, unspecified etiology; suspect early viral URI    There is no evidence of bacterial infection today.  Treat symptomatically for  now   If increasing cold symptoms develop, try the following: Take plain guaifenesin (1261m extended release tabs such as Mucinex) twice daily, with plenty of water, for cough and congestion. Get adequate rest.    Also recommend using saline nasal spray several times daily and saline nasal  irrigation (AYR is a common brand).   Continue Flonase nasal spray. Try warm salt water gargles for sore throat.  Stop all antihistamines for now, and other non-prescription cough/cold preparations. May take Delsym Cough Suppressant at bedtime for nighttime cough.  Follow-up with family doctor if not improving about 7 to10 days.    Kandra Nicolas, MD 11/27/15 [phone]

## 2015-11-27 NOTE — ED Notes (Signed)
Pt c/o sore throat x 2 days. Denies fever or cough.

## 2015-11-30 ENCOUNTER — Emergency Department (INDEPENDENT_AMBULATORY_CARE_PROVIDER_SITE_OTHER): Payer: Medicare HMO

## 2015-11-30 ENCOUNTER — Emergency Department (INDEPENDENT_AMBULATORY_CARE_PROVIDER_SITE_OTHER)
Admission: EM | Admit: 2015-11-30 | Discharge: 2015-11-30 | Disposition: A | Discharge status: Home / Self Care | Payer: Medicare HMO | Source: Home / Self Care | Attending: Family Medicine | Admitting: Family Medicine

## 2015-11-30 ENCOUNTER — Encounter: Payer: Self-pay | Admitting: Emergency Medicine

## 2015-11-30 DIAGNOSIS — R0602 Shortness of breath: Secondary | ICD-10-CM

## 2015-11-30 DIAGNOSIS — R062 Wheezing: Secondary | ICD-10-CM | POA: Diagnosis not present

## 2015-11-30 DIAGNOSIS — R05 Cough: Secondary | ICD-10-CM | POA: Diagnosis not present

## 2015-11-30 DIAGNOSIS — R69 Illness, unspecified: Secondary | ICD-10-CM | POA: Diagnosis not present

## 2015-11-30 DIAGNOSIS — R059 Cough, unspecified: Secondary | ICD-10-CM

## 2015-11-30 MED ORDER — IPRATROPIUM-ALBUTEROL 0.5-2.5 (3) MG/3ML IN SOLN
3.0000 mL | Freq: Four times a day (QID) | RESPIRATORY_TRACT | Status: DC
  Administered 2015-11-30: 3 mL via RESPIRATORY_TRACT

## 2015-11-30 MED ORDER — METHYLPREDNISOLONE SODIUM SUCC 40 MG IJ SOLR
40.0000 mg | Freq: Once | INTRAMUSCULAR | Status: AC
  Administered 2015-11-30: 40 mg via INTRAMUSCULAR

## 2015-11-30 MED ORDER — AEROCHAMBER PLUS W/MASK MISC: Status: DC

## 2015-11-30 MED ORDER — ALBUTEROL SULFATE HFA 108 (90 BASE) MCG/ACT IN AERS: 1.0000 | INHALATION_SPRAY | Freq: Four times a day (QID) | RESPIRATORY_TRACT | Status: DC | PRN

## 2015-11-30 MED ORDER — PREDNISONE 20 MG PO TABS: ORAL_TABLET | ORAL | Status: DC

## 2015-11-30 MED ORDER — AZITHROMYCIN 250 MG PO TABS: 250.0000 mg | ORAL_TABLET | Freq: Every day | ORAL | Status: DC

## 2015-11-30 NOTE — Discharge Instructions (Signed)
You were given a shot of solumedrol (a steroid) today to help decrease inflammation in your airways to help you breath better.  You have been prescribed prednisone pills to continue to help with your breathing.  Please start this medication tomorrow morning and take with breakfast.    Follow up with your primary care provider and oncologist for ongoing healthcare needs including bloodwork.     Cool Mist Vaporizers Vaporizers may help relieve the symptoms of a cough and cold. They add moisture to the air, which helps mucus to become thinner and less sticky. This makes it easier to breathe and cough up secretions. Cool mist vaporizers do not cause serious burns like hot mist vaporizers, which may also be called steamers or humidifiers. Vaporizers have not been proven to help with colds. You should not use a vaporizer if you are allergic to mold. HOME CARE INSTRUCTIONS  Follow the package instructions for the vaporizer.  Do not use anything other than distilled water in the vaporizer.  Do not run the vaporizer all of the time. This can cause mold or bacteria to grow in the vaporizer.  Clean the vaporizer after each time it is used.  Clean and dry the vaporizer well before storing it.  Stop using the vaporizer if worsening respiratory symptoms develop.   This information is not intended to replace advice given to you by your health care provider. Make sure you discuss any questions you have with your health care provider.   Document Released: 07/30/2004 Document Revised: 11/07/2013 Document Reviewed: 03/22/2013 Elsevier Interactive Patient Education 2016 Elsevier Inc[employer]  Cough, Adult A cough helps to clear your throat and lungs. A cough may last only 2-3 weeks (acute), or it may last longer than 8 weeks (chronic). Many different things can cause a cough. A cough may be a sign of an illness or another medical condition. HOME CARE  Pay attention to any changes in your cough.  Take medicines  only as told by your doctor.  If you were prescribed an antibiotic medicine, take it as told by your doctor. Do not stop taking it even if you start to feel better.  Talk with your doctor before you try using a cough medicine.  Drink enough fluid to keep your pee (urine) clear or pale yellow.  If the air is dry, use a cold steam vaporizer or humidifier in your home.  Stay away from things that make you cough at work or at home.  If your cough is worse at night, try using extra pillows to raise your head up higher while you sleep.  Do not smoke, and try not to be around smoke. If you need help quitting, ask your doctor.  Do not have caffeine.  Do not drink alcohol.  Rest as needed. GET HELP IF:  You have new problems (symptoms).  You cough up yellow fluid (pus).  Your cough does not get better after 2-3 weeks, or your cough gets worse.  Medicine does not help your cough and you are not sleeping well.  You have pain that gets worse or pain that is not helped with medicine.  You have a fever.  You are losing weight and you do not know why.  You have night sweats. GET HELP RIGHT AWAY IF:  You cough up blood.  You have trouble breathing.  Your heartbeat is very fast.   This information is not intended to replace advice given to you by your health care provider. Make sure you  discuss any questions you have with your health care provider.   Document Released: 07/16/2011 Document Revised: 07/24/2015 Document Reviewed: 01/09/2015 Elsevier Interactive Patient Education [employer].

## 2015-11-30 NOTE — ED Notes (Signed)
Pt was seen here 11/27/15 with c/o sore throat. States since then she has developed non productive cough, wheeze and SOB. Denies fever.

## 2015-11-30 NOTE — ED Provider Notes (Signed)
CSN: [id number]     Arrival date & time 11/30/15  0912 History   First MD Initiated Contact with Patient 11/30/15 [phone]     Chief Complaint  Patient presents with  . Cough   (Consider location/radiation/quality/duration/timing/severity/associated sxs/prior Treatment) HPI  Pt is an 82yo female presenting to [facility] with c/o worsening shortness of breath with cough and chest tightness that started about 2-3 days ago.  Pt states she has had a mild to moderately intermittent non-productive cough with wheeze.  She was seen on 11/27/15 for sore throat but states the sore throat has gradually improved.  She has also had mild nasal congestion.  Denies chest pain. Denies fever, chills, n/v/d.  She does not have any inhalers at home.  Per medical records pt was suppose to have a modified barium swallow on 11/14/15 for throat pain.  Pt states she did have the study done and everything was normal. No additional studies have been recommended yet.    Past Medical History  Diagnosis Date  . Hypertension   . Allergy   . Cataract   . CAD (coronary artery disease)   . Hyperlipidemia   . Cardiomyopathy ([place])     improved  . Arthritis   . Osteoporosis   . History of radiation therapy 1994    left chest wall  . History of chemotherapy   . Wears glasses   . Wears dentures     full bottom-partial top  . Cancer ([place])   . Ovarian ca ([place])   . Breast CA ([place])   . Eccrine porocarcinoma of skin   . Diverticulosis    Past Surgical History  Procedure Laterality Date  . Left breast mastectomy  1994    T3 N1 Mx  . Shoulder arthroscopy    . Right leg  1990    fx  . Shoulder surgery  1990    replacement-lt  . Modified radical mastectomy w/ axillary lymph node dissection w/ pectoralis minor excision  1994    Left  . Eye surgery      CATARACTS  . Abdominal hysterectomy  2004  . Cholecystectomy  1993  . Breast surgery    . Skin biopsy  04/27/2011    left hand, forearm and posterior arm  . Mass excision  Left 05/09/2014    Procedure: EXCISION OF NODULE LEFT CHEST ;  Surgeon: Pedro Earls, MD;  Location: [facility];  Service: General;  Laterality: Left;   Family History  Problem Relation Age of Onset  . Breast cancer Sister     dx in her 36s  . Heart disease Sister   . Heart disease Father   . Heart disease Mother   . Stroke Mother   . Prostate cancer Maternal Uncle   . Stroke Maternal Grandmother   . Testicular cancer Maternal Grandfather   . Stroke Maternal Grandfather   . Stroke Paternal Grandmother   . Heart disease Paternal Grandfather   . Breast cancer Sister 19    BRCA2+  . Breast cancer Sister 62  . Prostate cancer Maternal Uncle   . Breast cancer Other     maternal great aunt dx later in life   Social History  Substance Use Topics  . Smoking status: Never Smoker   . Smokeless tobacco: Never Used  . Alcohol Use: No   OB History    No data available     Review of Systems  Constitutional: Negative for fever and chills.  HENT: Positive for  congestion, rhinorrhea, sore throat and voice change ( hoarse voice- chronic). Negative for sinus pressure, sneezing and trouble swallowing.   Respiratory: Positive for cough, chest tightness, shortness of breath and wheezing. Negative for stridor.   Cardiovascular: Negative for chest pain, palpitations and leg swelling.  Gastrointestinal: Negative for nausea, vomiting, abdominal pain and diarrhea.    Allergies  Ibuprofen and Morphine  Home Medications   Prior to Admission medications   Medication Sig Start Date End Date Taking? Authorizing Provider  albuterol (PROVENTIL HFA;VENTOLIN HFA) 108 (90 Base) MCG/ACT inhaler Inhale 1-2 puffs into the lungs every 6 (six) hours as needed for wheezing or shortness of breath. 11/30/15   Noland Fordyce, PA-C  aspirin EC 81 MG tablet Take 81 mg by mouth at bedtime.     Historical Provider, MD  azithromycin (ZITHROMAX) 250 MG tablet Take 1 tablet (250 mg total) by mouth  daily. Take first 2 tablets together, then 1 every day until finished. 11/30/15   Noland Fordyce, PA-C  calcium carbonate (TUMS - DOSED IN MG ELEMENTAL CALCIUM) 500 MG chewable tablet Chew 2 tablets by mouth daily with supper.    Historical Provider, MD  carvedilol (COREG) 25 MG tablet TAKE ONE TABLET TWICE DAILY WITH FOOD 09/13/15   Marletta Lor, MD  Cholecalciferol (VITAMIN D) 2000 UNITS tablet Take 2,000 Units by mouth every morning.     Historical Provider, MD  Coenzyme Q10 (CO Q 10) 100 MG CAPS Take 1 capsule by mouth every morning.     Historical Provider, MD  denosumab (PROLIA) 60 MG/ML SOLN injection Inject 60 mg into the skin every 6 (six) months. Administer in upper arm, thigh, or abdomen    Historical Provider, MD  ferrous gluconate (FERGON) 324 MG tablet Take 324 mg by mouth daily before breakfast. WITH GLASS OF ORANGE JUICE 04/01/12   Lennis Marion Downer, MD  fluticasone (FLONASE) 50 MCG/ACT nasal spray Place 2 sprays into both nostrils daily. 08/30/15   Marletta Lor, MD  lisinopril-hydrochlorothiazide (ZESTORETIC) 20-12.5 MG tablet Take 1 tablet by mouth daily. 09/11/15   Marletta Lor, MD  loperamide (IMODIUM) 2 MG capsule Take 1 capsule (2 mg total) by mouth as needed for diarrhea or loose stools. 01/24/13   Lennis Marion Downer, MD  pantoprazole (PROTONIX) 40 MG tablet Take 1 tablet (40 mg total) by mouth daily. 10/04/15   Brett Canales, PA-C  predniSONE (DELTASONE) 20 MG tablet 2 tabs po daily x 3 days 11/30/15   Noland Fordyce, PA-C  simvastatin (ZOCOR) 40 MG tablet TAKE ONE TABLET DAILY AT BEDTIME 10/16/15   Marletta Lor, MD  Spacer/Aero-Holding Chambers (AEROCHAMBER PLUS WITH MASK) inhaler Use as instructed 11/30/15   Noland Fordyce, PA-C  traMADol (ULTRAM) 50 MG tablet TAKE 1 TABLET EVERY 6 HOURS AS NEEDED FOR PAIN 10/25/15   Marletta Lor, MD   Meds Ordered and Administered this Visit   Medications  ipratropium-albuterol (DUONEB) 0.5-2.5 (3) MG/3ML nebulizer  solution 3 mL (3 mLs Nebulization Given 11/30/15 0958)  methylPREDNISolone sodium succinate (SOLU-MEDROL) 40 mg/mL injection 40 mg (40 mg Intramuscular Given 11/30/15 1006)    BP 178/78 mmHg  Pulse 79  Temp(Src) 97.6 F (36.4 C) (Oral)  SpO2 95% No data found.   Physical Exam  Constitutional: She appears well-developed and well-nourished. No distress.  HENT:  Head: Normocephalic and atraumatic.  Right Ear: Hearing, tympanic membrane, external ear and ear canal normal.  Left Ear: Hearing, tympanic membrane, external ear and ear canal normal.  Nose:  Nose normal.  Mouth/Throat: Uvula is midline and mucous membranes are normal. Posterior oropharyngeal erythema present. No oropharyngeal exudate, posterior oropharyngeal edema or tonsillar abscesses.  Eyes: Conjunctivae are normal. No scleral icterus.  Neck: Normal range of motion. Neck supple.  Hoarse voice but no stridor.  Cardiovascular: Normal rate, regular rhythm and normal heart sounds.   Pulmonary/Chest: Effort normal. No stridor. No respiratory distress. She has wheezes. She has no rales. She exhibits no tenderness.  No respiratory distress. Lungs: diffuse expiratory wheeze without rhonchi.  Abdominal: Soft. She exhibits no distension and no mass. There is no tenderness. There is no rebound and no guarding.  Musculoskeletal: Normal range of motion.  Lymphadenopathy:    She has no cervical adenopathy.  Neurological: She is alert.  Skin: Skin is warm and dry. She is not diaphoretic.  Nursing note and vitals reviewed.   ED Course  Procedures (including critical care time)  Labs Review Labs Reviewed - No data to display  Imaging Review Dg Chest 2 View  11/30/2015  CLINICAL DATA:  Productive cough. EXAM: CHEST  2 VIEW COMPARISON:  10/13/2015 FINDINGS: Status post left mastectomy. White basilar nipple shadow. Normal heart size. Lungs severely hyperaerated. Hilar calcifications are related to old granulomatous disease. No  consolidation or lung mass. Chronic left apical pleural changes. Left shoulder hemiarthroplasty. T12 vertebra plana unchanged. Osteopenia. Mild L1 compression deformity. This is stable. IMPRESSION: No active cardiopulmonary disease.  Chronic changes are noted. Electronically Signed   By: Marybelle Killings M.D.   On: 11/30/2015 09:59       MDM   1. Cough   2. Wheeze   3. Shortness of breath    Pt c/o cough with chest tightness, shortness of breath and wheeze for 2-3 days.  No respiratory distress, O2 Sat 95% on RA.   Lungs: diffuse expiratory wheeze.  CXR: no active cardiopulmonary disease. Chronic changes noted.  Pt's medical records reviewed. Pt had an extensive workup in November for SOB in the emergency department as well as had close f/u with her cardiologist, PCP, oncology and gastroenterology.  No significant findings to account for pt's SOB at that time.  PE and ACS were ruled out.  Today, SOB appears to be due to asthma/COPD exacerbation.  Tx in UC: Duoneb and solumedrol 21m IM  Pt states she feels moderately improved after treatment.  Lung sounds also improve.  Faint expiratory wheeze in upper lung fields still present.  Rx: prednisone 438mdaily for 3 days, albuterol inhaler with spacer.  Prescription for Azithromycin given with expiration date. Advised to fill in 4-5 days if cough and SOB not improving, sooner if fever develops.  Discussed symptoms that warrant emergent care in the ED. Patient verbalized understanding and agreement with treatment plan.     ErNoland FordycePA-C 11/30/15 1025

## 2015-12-02 ENCOUNTER — Telehealth: Payer: Self-pay | Admitting: Emergency Medicine

## 2015-12-03 ENCOUNTER — Ambulatory Visit (INDEPENDENT_AMBULATORY_CARE_PROVIDER_SITE_OTHER): Payer: Medicare HMO | Admitting: Internal Medicine

## 2015-12-03 ENCOUNTER — Encounter: Payer: Self-pay | Admitting: Internal Medicine

## 2015-12-03 VITALS — BP 142/74 | HR 72 | Temp 97.8°F | Resp 22 | Ht 62.0 in | Wt 96.0 lb

## 2015-12-03 DIAGNOSIS — I420 Dilated cardiomyopathy: Secondary | ICD-10-CM | POA: Diagnosis not present

## 2015-12-03 DIAGNOSIS — J42 Unspecified chronic bronchitis: Secondary | ICD-10-CM | POA: Diagnosis not present

## 2015-12-03 DIAGNOSIS — I10 Essential (primary) hypertension: Secondary | ICD-10-CM

## 2015-12-03 DIAGNOSIS — E785 Hyperlipidemia, unspecified: Secondary | ICD-10-CM

## 2015-12-03 DIAGNOSIS — J441 Chronic obstructive pulmonary disease with (acute) exacerbation: Secondary | ICD-10-CM

## 2015-12-03 MED ORDER — AEROCHAMBER PLUS W/MASK MISC: Status: DC

## 2015-12-03 MED ORDER — AZITHROMYCIN 250 MG PO TABS: 250.0000 mg | ORAL_TABLET | Freq: Every day | ORAL | Status: DC

## 2015-12-03 MED ORDER — PREDNISONE 20 MG PO TABS: ORAL_TABLET | ORAL | Status: DC

## 2015-12-03 NOTE — Patient Instructions (Signed)
Take over-the-counter expectorants and cough medications such as  Mucinex DM.  Call if there is no improvement in 5 to 7 days or if  you develop worsening cough, fever, or new symptoms, such as shortness of breath or chest pain. 

## 2015-12-03 NOTE — Progress Notes (Signed)
Subjective:    Patient ID: Judy Herrera, female    DOB: 1931-05-06, 82 y.o.   MRN: [id number]  HPI  82 year old patient who has a history of COPD.  She was seen at the urgent care.  7 days ago complaining of sore throat and some congestion.  She was treated symptomatically.  She is again seen at the urgent care 3 days ago complaining of increasing cough, congestion, wheezing and shortness of breath.  A chest x-ray was obtained that revealed significant hyperinflation but no acute infiltrate.  She continues to have cough, congestion.  She is also treated with short-term prednisone.  She still feels unwell.  She has been using albuterol but did not pick up a spacer device  Past Medical History  Diagnosis Date  . Hypertension   . Allergy   . Cataract   . CAD (coronary artery disease)   . Hyperlipidemia   . Cardiomyopathy ([place])     improved  . Arthritis   . Osteoporosis   . History of radiation therapy 1994    left chest wall  . History of chemotherapy   . Wears glasses   . Wears dentures     full bottom-partial top  . Cancer ([place])   . Ovarian ca ([place])   . Breast CA ([place])   . Eccrine porocarcinoma of skin   . Diverticulosis     Social History   Social History  . Marital Status: Widowed    Spouse Name: N/A  . Number of Children: 1  . Years of Education: N/A   Occupational History  . Not on file.   Social History Main Topics  . Smoking status: Never Smoker   . Smokeless tobacco: Never Used  . Alcohol Use: No  . Drug Use: No  . Sexual Activity: Not on file     Comment: didn't ask   Other Topics Concern  . Not on file   Social History Narrative    Past Surgical History  Procedure Laterality Date  . Left breast mastectomy  1994    T3 N1 Mx  . Shoulder arthroscopy    . Right leg  1990    fx  . Shoulder surgery  1990    replacement-lt  . Modified radical mastectomy w/ axillary lymph node dissection w/ pectoralis minor excision  1994    Left  . Eye  surgery      CATARACTS  . Abdominal hysterectomy  2004  . Cholecystectomy  1993  . Breast surgery    . Skin biopsy  04/27/2011    left hand, forearm and posterior arm  . Mass excision Left 05/09/2014    Procedure: EXCISION OF NODULE LEFT CHEST ;  Surgeon: Pedro Earls, MD;  Location: [facility];  Service: General;  Laterality: Left;    Family History  Problem Relation Age of Onset  . Breast cancer Sister     dx in her 71s  . Heart disease Sister   . Heart disease Father   . Heart disease Mother   . Stroke Mother   . Prostate cancer Maternal Uncle   . Stroke Maternal Grandmother   . Testicular cancer Maternal Grandfather   . Stroke Maternal Grandfather   . Stroke Paternal Grandmother   . Heart disease Paternal Grandfather   . Breast cancer Sister 77    BRCA2+  . Breast cancer Sister 34  . Prostate cancer Maternal Uncle   . Breast cancer Other  maternal great aunt dx later in life    Allergies  Allergen Reactions  . Ibuprofen Nausea Only  . Morphine Nausea And Vomiting    Current Outpatient Prescriptions on File Prior to Visit  Medication Sig Dispense Refill  . albuterol (PROVENTIL HFA;VENTOLIN HFA) 108 (90 Base) MCG/ACT inhaler Inhale 1-2 puffs into the lungs every 6 (six) hours as needed for wheezing or shortness of breath. 1 Inhaler 0  . aspirin EC 81 MG tablet Take 81 mg by mouth at bedtime.     . calcium carbonate (TUMS - DOSED IN MG ELEMENTAL CALCIUM) 500 MG chewable tablet Chew 2 tablets by mouth daily with supper.    . carvedilol (COREG) 25 MG tablet TAKE ONE TABLET TWICE DAILY WITH FOOD 180 tablet 1  . Cholecalciferol (VITAMIN D) 2000 UNITS tablet Take 2,000 Units by mouth every morning.     . Coenzyme Q10 (CO Q 10) 100 MG CAPS Take 1 capsule by mouth every morning.     . denosumab (PROLIA) 60 MG/ML SOLN injection Inject 60 mg into the skin every 6 (six) months. Administer in upper arm, thigh, or abdomen    . ferrous gluconate (FERGON) 324 MG  tablet Take 324 mg by mouth daily before breakfast. WITH GLASS OF ORANGE JUICE    . fluticasone (FLONASE) 50 MCG/ACT nasal spray Place 2 sprays into both nostrils daily. 16 g 5  . lisinopril-hydrochlorothiazide (ZESTORETIC) 20-12.5 MG tablet Take 1 tablet by mouth daily. 90 tablet 3  . loperamide (IMODIUM) 2 MG capsule Take 1 capsule (2 mg total) by mouth as needed for diarrhea or loose stools. 20 capsule 0  . pantoprazole (PROTONIX) 40 MG tablet Take 1 tablet (40 mg total) by mouth daily. 90 tablet 1  . simvastatin (ZOCOR) 40 MG tablet TAKE ONE TABLET DAILY AT BEDTIME 90 tablet 3  . Spacer/Aero-Holding Chambers (AEROCHAMBER PLUS WITH MASK) inhaler Use as instructed 1 each 2  . traMADol (ULTRAM) 50 MG tablet TAKE 1 TABLET EVERY 6 HOURS AS NEEDED FOR PAIN 60 tablet 1   No current facility-administered medications on file prior to visit.    BP 142/74 mmHg  Pulse 72  Temp(Src) 97.8 F (36.6 C) (Oral)  Resp 22  Ht 5' 2"  (1.575 m)  Wt 96 lb (43.545 kg)  BMI 17.55 kg/m2  SpO2 97%     Review of Systems  Constitutional: Positive for activity change, appetite change and fatigue.  HENT: Positive for congestion. Negative for dental problem, hearing loss, rhinorrhea, sinus pressure, sore throat and tinnitus.   Eyes: Negative for pain, discharge and visual disturbance.  Respiratory: Positive for cough, shortness of breath and wheezing.   Cardiovascular: Negative for chest pain, palpitations and leg swelling.  Gastrointestinal: Negative for nausea, vomiting, abdominal pain, diarrhea, constipation, blood in stool and abdominal distention.  Genitourinary: Negative for dysuria, urgency, frequency, hematuria, flank pain, vaginal bleeding, vaginal discharge, difficulty urinating, vaginal pain and pelvic pain.  Musculoskeletal: Negative for joint swelling, arthralgias and gait problem.  Skin: Negative for rash.  Neurological: Negative for dizziness, syncope, speech difficulty, weakness, numbness and  headaches.  Hematological: Negative for adenopathy.  Psychiatric/Behavioral: Negative for behavioral problems, dysphoric mood and agitation. The patient is not nervous/anxious.        Objective:   Physical Exam  Constitutional: She is oriented to person, place, and time. She appears well-developed and well-nourished.  HENT:  Head: Normocephalic.  Right Ear: External ear normal.  Left Ear: External ear normal.  Mouth/Throat: Oropharynx is clear and  moist.  Eyes: Conjunctivae and EOM are normal. Pupils are equal, round, and reactive to light.  Neck: Normal range of motion. Neck supple. No thyromegaly present.  Cardiovascular: Normal rate, regular rhythm, normal heart sounds and intact distal pulses.   Pulmonary/Chest: Effort normal.  Diminished breath sounds Few rhonchi No active wheezing No increased work of breathing Oxygen saturation 97%  Abdominal: Soft. Bowel sounds are normal. She exhibits no mass. There is no tenderness.  Musculoskeletal: Normal range of motion. She exhibits no edema.  Lymphadenopathy:    She has no cervical adenopathy.  Neurological: She is alert and oriented to person, place, and time.  Skin: Skin is warm and dry. No rash noted.  Psychiatric: She has a normal mood and affect. Her behavior is normal.          Assessment & Plan:   Exacerbation of COPD Essential hypertension History of dilated cardiomyopathy  Patient does have a prescription for erythromycin.  She will complete Continue albuterol with spacer Prednisone 20 mg twice a day for an additional 5 days

## 2015-12-03 NOTE — Progress Notes (Signed)
Pre visit review using our clinic review tool, if applicable. No additional management support is needed unless otherwise documented below in the visit note. 

## 2015-12-17 DIAGNOSIS — J3802 Paralysis of vocal cords and larynx, bilateral: Secondary | ICD-10-CM | POA: Diagnosis not present

## 2015-12-18 DIAGNOSIS — M810 Age-related osteoporosis without current pathological fracture: Secondary | ICD-10-CM | POA: Diagnosis not present

## 2015-12-18 DIAGNOSIS — E559 Vitamin D deficiency, unspecified: Secondary | ICD-10-CM | POA: Diagnosis not present

## 2015-12-18 DIAGNOSIS — R5383 Other fatigue: Secondary | ICD-10-CM | POA: Diagnosis not present

## 2015-12-18 LAB — CBC AND DIFFERENTIAL
HEMATOCRIT: 31 % — AB (ref 36–46)
Hemoglobin: 10.1 g/dL — AB (ref 12.0–16.0)
Platelets: 162 10*3/uL (ref 150–399)
WBC: 4.1 10^3/mL

## 2015-12-18 LAB — HEPATIC FUNCTION PANEL
ALK PHOS: 37 U/L (ref 25–125)
ALT: 24 U/L (ref 7–35)
AST: 18 U/L (ref 13–35)
BILIRUBIN, TOTAL: 1.4 mg/dL

## 2015-12-18 LAB — BASIC METABOLIC PANEL
BUN: 14 mg/dL (ref 4–21)
Creatinine: 1.1 mg/dL (ref 0.5–1.1)
Glucose: 83 mg/dL
Potassium: 4.3 mmol/L (ref 3.4–5.3)
SODIUM: 131 mmol/L — AB (ref 137–147)

## 2015-12-25 DIAGNOSIS — M810 Age-related osteoporosis without current pathological fracture: Secondary | ICD-10-CM | POA: Diagnosis not present

## 2015-12-25 DIAGNOSIS — S22000D Wedge compression fracture of unspecified thoracic vertebra, subsequent encounter for fracture with routine healing: Secondary | ICD-10-CM | POA: Diagnosis not present

## 2015-12-26 ENCOUNTER — Other Ambulatory Visit: Payer: Self-pay | Admitting: Sports Medicine

## 2015-12-26 DIAGNOSIS — Z780 Asymptomatic menopausal state: Secondary | ICD-10-CM

## 2016-01-01 ENCOUNTER — Ambulatory Visit (INDEPENDENT_AMBULATORY_CARE_PROVIDER_SITE_OTHER): Payer: Medicare HMO

## 2016-01-01 DIAGNOSIS — M810 Age-related osteoporosis without current pathological fracture: Secondary | ICD-10-CM | POA: Diagnosis not present

## 2016-01-01 DIAGNOSIS — M859 Disorder of bone density and structure, unspecified: Secondary | ICD-10-CM

## 2016-01-01 DIAGNOSIS — Z13820 Encounter for screening for osteoporosis: Secondary | ICD-10-CM | POA: Diagnosis not present

## 2016-01-01 DIAGNOSIS — Z780 Asymptomatic menopausal state: Secondary | ICD-10-CM

## 2016-01-08 DIAGNOSIS — C50912 Malignant neoplasm of unspecified site of left female breast: Secondary | ICD-10-CM | POA: Diagnosis not present

## 2016-01-10 ENCOUNTER — Ambulatory Visit (INDEPENDENT_AMBULATORY_CARE_PROVIDER_SITE_OTHER): Payer: Medicare HMO | Admitting: Internal Medicine

## 2016-01-10 ENCOUNTER — Encounter: Payer: Self-pay | Admitting: Internal Medicine

## 2016-01-10 VITALS — BP 130/72 | HR 80 | Temp 98.3°F | Resp 18 | Ht 62.0 in | Wt 96.0 lb

## 2016-01-10 DIAGNOSIS — I420 Dilated cardiomyopathy: Secondary | ICD-10-CM

## 2016-01-10 DIAGNOSIS — C4499 Other specified malignant neoplasm of skin, unspecified: Secondary | ICD-10-CM | POA: Diagnosis not present

## 2016-01-10 DIAGNOSIS — I2510 Atherosclerotic heart disease of native coronary artery without angina pectoris: Secondary | ICD-10-CM | POA: Diagnosis not present

## 2016-01-10 DIAGNOSIS — I10 Essential (primary) hypertension: Secondary | ICD-10-CM | POA: Diagnosis not present

## 2016-01-10 DIAGNOSIS — E785 Hyperlipidemia, unspecified: Secondary | ICD-10-CM

## 2016-01-10 NOTE — Patient Instructions (Signed)
Limit your sodium (Salt) intake    It is important that you exercise regularly, at least 20 minutes 3 to 4 times per week.  If you develop chest pain or shortness of breath seek  medical attention.  Return in 6 months for follow-up  

## 2016-01-10 NOTE — Progress Notes (Signed)
Pre visit review using our clinic review tool, if applicable. No additional management support is needed unless otherwise documented below in the visit note. 

## 2016-01-10 NOTE — Progress Notes (Signed)
Subjective:    Patient ID: Judy Herrera, female    DOB: Nov 11, 1931, 82 y.o.   MRN: [id number]  HPI  82 year old patient who is seen today for her six-month follow-up.  She was seen last month for exacerbation of COPD which has resolved.  She has a history of coronary artery disease as well as dilated cardiomyopathy.  She is followed periodically by cardiology. 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  She is doing quite well.  No new concerns or complaints.  No cardiopulmonary complaints  Past Medical History  Diagnosis Date  . Hypertension   . Allergy   . Cataract   . CAD (coronary artery disease)   . Hyperlipidemia   . Cardiomyopathy ([place])     improved  . Arthritis   . Osteoporosis   . History of radiation therapy 1994    left chest wall  . History of chemotherapy   . Wears glasses   . Wears dentures     full bottom-partial top  . Cancer ([place])   . Ovarian ca ([place])   . Breast CA ([place])   . Eccrine porocarcinoma of skin   . Diverticulosis     Social History   Social History  . Marital Status: Widowed    Spouse Name: N/A  . Number of Children: 1  . Years of Education: N/A   Occupational History  . Not on file.   Social History Main Topics  . Smoking status: Never Smoker   . Smokeless tobacco: Never Used  . Alcohol Use: No  . Drug Use: No  . Sexual Activity: Not on file     Comment: didn't ask   Other Topics Concern  . Not on file   Social History Narrative    Past Surgical History  Procedure Laterality Date  . Left breast mastectomy  1994    T3 N1 Mx  . Shoulder arthroscopy    . Right leg  1990    fx  . Shoulder surgery  1990    replacement-lt  . Modified radical mastectomy w/ axillary lymph node dissection w/ pectoralis minor excision  1994    Left  . Eye surgery      CATARACTS  . Abdominal hysterectomy  2004  . Cholecystectomy   1993  . Breast surgery    . Skin biopsy  04/27/2011    left hand, forearm and posterior arm  . Mass excision Left 05/09/2014    Procedure: EXCISION OF NODULE LEFT CHEST ;  Surgeon: Pedro Earls, MD;  Location: [facility];  Service: General;  Laterality: Left;    Family History  Problem Relation Age of Onset  . Breast cancer Sister     dx in her 74s  . Heart disease Sister   . Heart disease Father   . Heart disease Mother   . Stroke Mother   . Prostate cancer Maternal Uncle   . Stroke Maternal Grandmother   . Testicular cancer Maternal Grandfather   . Stroke Maternal Grandfather   . Stroke Paternal Grandmother   . Heart disease Paternal Grandfather   . Breast cancer Sister 54    BRCA2+  . Breast cancer Sister 47  . Prostate cancer Maternal Uncle   . Breast cancer Other     maternal great aunt dx later in life    Allergies  Allergen  Reactions  . Ibuprofen Nausea Only  . Morphine Nausea And Vomiting    Current Outpatient Prescriptions on File Prior to Visit  Medication Sig Dispense Refill  . albuterol (PROVENTIL HFA;VENTOLIN HFA) 108 (90 Base) MCG/ACT inhaler Inhale 1-2 puffs into the lungs every 6 (six) hours as needed for wheezing or shortness of breath. 1 Inhaler 0  . aspirin EC 81 MG tablet Take 81 mg by mouth at bedtime.     Marland Kitchen azithromycin (ZITHROMAX) 250 MG tablet Take 1 tablet (250 mg total) by mouth daily. Take first 2 tablets together, then 1 every day until finished. 6 tablet 0  . calcium carbonate (TUMS - DOSED IN MG ELEMENTAL CALCIUM) 500 MG chewable tablet Chew 2 tablets by mouth daily with supper.    . carvedilol (COREG) 25 MG tablet TAKE ONE TABLET TWICE DAILY WITH FOOD 180 tablet 1  . Cholecalciferol (VITAMIN D) 2000 UNITS tablet Take 2,000 Units by mouth every morning.     . Coenzyme Q10 (CO Q 10) 100 MG CAPS Take 1 capsule by mouth every morning.     . denosumab (PROLIA) 60 MG/ML SOLN injection Inject 60 mg into the skin every 6 (six)  months. Administer in upper arm, thigh, or abdomen    . ferrous gluconate (FERGON) 324 MG tablet Take 324 mg by mouth daily before breakfast. WITH GLASS OF ORANGE JUICE    . fluticasone (FLONASE) 50 MCG/ACT nasal spray Place 2 sprays into both nostrils daily. 16 g 5  . lisinopril-hydrochlorothiazide (ZESTORETIC) 20-12.5 MG tablet Take 1 tablet by mouth daily. 90 tablet 3  . loperamide (IMODIUM) 2 MG capsule Take 1 capsule (2 mg total) by mouth as needed for diarrhea or loose stools. 20 capsule 0  . pantoprazole (PROTONIX) 40 MG tablet Take 1 tablet (40 mg total) by mouth daily. 90 tablet 1  . predniSONE (DELTASONE) 20 MG tablet 1 tablet twice daily for 5 days 10 tablet 0  . simvastatin (ZOCOR) 40 MG tablet TAKE ONE TABLET DAILY AT BEDTIME 90 tablet 3  . Spacer/Aero-Holding Chambers (AEROCHAMBER PLUS WITH MASK) inhaler Use as instructed 1 each 2  . traMADol (ULTRAM) 50 MG tablet TAKE 1 TABLET EVERY 6 HOURS AS NEEDED FOR PAIN 60 tablet 1   No current facility-administered medications on file prior to visit.    BP 130/72 mmHg  Pulse 80  Temp(Src) 98.3 F (36.8 C) (Oral)  Resp 18  Ht 5' 2"  (1.575 m)  Wt 96 lb (43.545 kg)  BMI 17.55 kg/m2     Review of Systems  Constitutional: Negative.   HENT: Negative for congestion, dental problem, hearing loss, rhinorrhea, sinus pressure, sore throat and tinnitus.   Eyes: Negative for pain, discharge and visual disturbance.  Respiratory: Negative for cough and shortness of breath.   Cardiovascular: Negative for chest pain, palpitations and leg swelling.  Gastrointestinal: Negative for nausea, vomiting, abdominal pain, diarrhea, constipation, blood in stool and abdominal distention.  Genitourinary: Negative for dysuria, urgency, frequency, hematuria, flank pain, vaginal bleeding, vaginal discharge, difficulty urinating, vaginal pain and pelvic pain.  Musculoskeletal: Negative for joint swelling, arthralgias and gait problem.  Skin: Negative for rash.   Neurological: Negative for dizziness, syncope, speech difficulty, weakness, numbness and headaches.  Hematological: Negative for adenopathy.  Psychiatric/Behavioral: Negative for behavioral problems, dysphoric mood and agitation. The patient is not nervous/anxious.        Objective:   Physical Exam  Constitutional: She is oriented to person, place, and time. She appears well-developed and well-nourished.  HENT:  Head: Normocephalic.  Right Ear: External ear normal.  Left Ear: External ear normal.  Mouth/Throat: Oropharynx is clear and moist.  Eyes: Conjunctivae and EOM are normal. Pupils are equal, round, and reactive to light.  Neck: Normal range of motion. Neck supple. No thyromegaly present.  Cardiovascular: Normal rate, regular rhythm, normal heart sounds and intact distal pulses.   Pulmonary/Chest: Effort normal and breath sounds normal.  Abdominal: Soft. Bowel sounds are normal. She exhibits no mass. There is no tenderness.  Musculoskeletal: Normal range of motion. She exhibits edema.  Chronic edematous changes, left arm  Lymphadenopathy:    She has no cervical adenopathy.  Neurological: She is alert and oriented to person, place, and time.  Skin: Skin is warm and dry. No rash noted.  Psychiatric: She has a normal mood and affect. Her behavior is normal.          Assessment & Plan:   Hypertension, stable Coronary artery disease.  Remains asymptomatic COPD stable  Recheck 6 months

## 2016-01-16 ENCOUNTER — Encounter: Payer: Self-pay | Admitting: Internal Medicine

## 2016-01-16 LAB — VITAMIN D 1,25 DIHYDROXY: Vitamin D, 25-Hydroxy: 70

## 2016-01-20 DIAGNOSIS — L821 Other seborrheic keratosis: Secondary | ICD-10-CM | POA: Diagnosis not present

## 2016-01-20 DIAGNOSIS — Z85828 Personal history of other malignant neoplasm of skin: Secondary | ICD-10-CM | POA: Diagnosis not present

## 2016-01-31 ENCOUNTER — Encounter: Payer: Self-pay | Admitting: Gastroenterology

## 2016-01-31 ENCOUNTER — Ambulatory Visit (INDEPENDENT_AMBULATORY_CARE_PROVIDER_SITE_OTHER): Payer: Medicare HMO | Admitting: Gastroenterology

## 2016-01-31 VITALS — BP 120/54 | HR 80 | Ht 61.0 in | Wt 96.0 lb

## 2016-01-31 DIAGNOSIS — R1310 Dysphagia, unspecified: Secondary | ICD-10-CM | POA: Diagnosis not present

## 2016-01-31 DIAGNOSIS — Z923 Personal history of irradiation: Secondary | ICD-10-CM

## 2016-01-31 NOTE — Progress Notes (Signed)
HPI :  82 y/o female known to our clinic, new patient to me, here for a follow up visit. She has a history of remote breast and ovarian cancer, CAD, and COPD. She has a history of left vocal cord paralysis since 2015. 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 She previously was seen for symptoms of dysphagia, to both liquids and solids which had been ongoing for a few months. She denied any symptoms of heartburn or regurgitation at the time of last evaluation, and had no epigastric pain or other symptoms. Location of dysphagia was in the mid chest.   Patient reports doing well since our last visit. She denies any dysphagia at this time, she thinks it cleared up on its own. She has started potonix 28m po q day for 3 months or so. She states since doing this her symptoms have gone away. She previously had trouble swallowing both liquids and solids which occurred intermittently. She previously never complained of heartburn or pyrosis. She denies any regurgitation at this time or has in the past. She has been off protonix for the past few weeks, and she remains doing well. She is eating well. No nausea or vomiting. She had symptoms for 2 months and then it resolved. She has a history of radiation to her chest for breast cancer.   She underwent a barium swallow after the last visit and she was unable to swallow the pill, but the esophagus appeared normal although appeared to be compressed by her hyperinflated lungs at several levels.  As she could not tolerate the pill, she was sent for modified barium swallow which showed no aspiration, no oropharyngeal dysphagia, and mild esophageal dysmotility.   Since that time she was placed on a few months worth of protonix and her dysphagia resolved. At present she has no symptoms at all. She stopped protonix a few weeks ago and continues to feel well. She has no complaints in regards to her swallowing at present time.    Past  Medical History  Diagnosis Date  . Hypertension   . Allergy   . Cataract   . CAD (coronary artery disease)   . Hyperlipidemia   . [place]omyopathy ([place]     improved  . Arthritis   . Osteoporosis   . History of radiation therapy 1994    left chest wall  . History of chemotherapy   . Wears glasses   . Wears dentures     full bottom-partial top  . Cancer ([facility]   . [place]varian ca ([facility]   . [place] [place]     NO BP OR STICKS IN LEFT ARM  . Eccrine porocarcinoma of skin   . Diverticulosis      Past Surgical History  Procedure Laterality Date  . Left breast mastectomy  1994    T3 N1 Mx  . Shoulder arthroscopy    . Right leg  1990    fx  . Shoulder surgery  1990    replacement-lt  . Modified radical mastectomy w/ axillary lymph node dissection w/ pectoralis minor excision  1994    Left  . Eye surgery      CATARACTS  . Abdominal hysterectomy  2004  . Cholecystectomy  1993  . Breast surgery    . Skin biopsy  04/27/2011    left hand, forearm and posterior arm  . Mass excision Left 05/09/2014    Procedure: EXCISION OF NODULE LEFT CHEST ;  Surgeon: MRodman Key  Verdie Drown, MD;  Location: [facility];  Service: General;  Laterality: Left;   Family History  Problem Relation Age of Onset  . Breast cancer Sister     dx in her 49s  . Heart disease Sister   . Heart disease Father   . Heart disease Mother   . Stroke Mother   . Prostate cancer Maternal Uncle   . Stroke Maternal Grandmother   . Testicular cancer Maternal Grandfather   . Stroke Maternal Grandfather   . Stroke Paternal Grandmother   . Heart disease Paternal Grandfather   . Breast cancer Sister 57    BRCA2+  . Breast cancer Sister 27  . Prostate cancer Maternal Uncle   . Breast cancer Other     maternal great aunt dx later in life   Social History  Substance Use Topics  . Smoking status: Never Smoker   . Smokeless tobacco: Never Used  . Alcohol Use: No   Current Outpatient Prescriptions  Medication  Sig Dispense Refill  . aspirin EC 81 MG tablet Take 81 mg by mouth at bedtime.     . calcium carbonate (TUMS - DOSED IN MG ELEMENTAL CALCIUM) 500 MG chewable tablet Chew 2 tablets by mouth daily with supper.    . carvedilol (COREG) 25 MG tablet TAKE ONE TABLET TWICE DAILY WITH FOOD 180 tablet 1  . Cholecalciferol (VITAMIN D) 2000 UNITS tablet Take 2,000 Units by mouth every morning.     . Coenzyme Q10 (CO Q 10) 100 MG CAPS Take 1 capsule by mouth every morning.     . denosumab (PROLIA) 60 MG/ML SOLN injection Inject 60 mg into the skin every 6 (six) months. Administer in upper arm, thigh, or abdomen    . ferrous gluconate (FERGON) 324 MG tablet Take 324 mg by mouth daily before breakfast. WITH GLASS OF ORANGE JUICE    . fluticasone (FLONASE) 50 MCG/ACT nasal spray Place 2 sprays into both nostrils daily. 16 g 5  . lisinopril-hydrochlorothiazide (ZESTORETIC) 20-12.5 MG tablet Take 1 tablet by mouth daily. 90 tablet 3  . loperamide (IMODIUM) 2 MG capsule Take 1 capsule (2 mg total) by mouth as needed for diarrhea or loose stools. 20 capsule 0  . pantoprazole (PROTONIX) 40 MG tablet Take 1 tablet (40 mg total) by mouth daily. 90 tablet 1  . simvastatin (ZOCOR) 40 MG tablet TAKE ONE TABLET DAILY AT BEDTIME 90 tablet 3  . traMADol (ULTRAM) 50 MG tablet TAKE 1 TABLET EVERY 6 HOURS AS NEEDED FOR PAIN 60 tablet 1   No current facility-administered medications for this visit.   Allergies  Allergen Reactions  . Ibuprofen Nausea Only  . Morphine Nausea And Vomiting     Review of Systems: All systems reviewed and negative except where noted in HPI.     Physical Exam: BP 120/54 mmHg  Pulse 80  Ht _0  (1.549 m)  Wt 96 lb (43.545 kg)  BMI 18.15 kg/m2 Constitutional: Pleasant, thin female in no acute distress, speaks with high pitched voice HEENT: Normocephalic and atraumatic. Conjunctivae are normal. No scleral icterus. Neck supple.  Cardiovascular: Normal rate, regular rhythm.    Pulmonary/chest: Effort normal and breath sounds normal, mildly decreased BL. No wheezing, rales or rhonchi. Abdominal: Soft, nondistended, nontender. Bowel sounds active throughout. There are no masses palpable. No hepatomegaly. Extremities: no edema Lymphadenopathy: No cervical adenopathy noted. Neurological: Alert and oriented to person place and time. Skin: Skin is warm and dry. No rashes noted. Psychiatric: Normal  mood and affect. Behavior is normal.   ASSESSMENT AND PLAN: 82 y/o female with history of breast and ovarian CA with history of radiation to her chest, who presented with several months of dysphagia. Barium swallows as above did not provide a clear etiology, perhaps some dysmotility. In the interim she was started on a PPI and is doing well at this time. She has had no symptoms since doing this. She stopped PPI a few weeks ago and continues to do well. Perhaps symptoms were related to reflux/esophagitis but she has never had prior traditional reflux symptoms. Unclear what drove this process, I had suspected radiation changes to the esophagus. As long as she is asymptomatic without dysphagia I don't think we need to do anything else at this time. If symproms recur she can try another course of protinox and follow up PRN. If symptoms persist consider EGD or manometry. She agreed with the plan and recommendations.   [place] Cellar, MD [facility] Pager [phone]

## 2016-01-31 NOTE — Patient Instructions (Signed)
Follow up with Dr. Armbruster as needed.  

## 2016-02-05 DIAGNOSIS — Z961 Presence of intraocular lens: Secondary | ICD-10-CM | POA: Diagnosis not present

## 2016-02-05 DIAGNOSIS — H1851 Endothelial corneal dystrophy: Secondary | ICD-10-CM | POA: Diagnosis not present

## 2016-02-05 DIAGNOSIS — H02831 Dermatochalasis of right upper eyelid: Secondary | ICD-10-CM | POA: Diagnosis not present

## 2016-02-05 DIAGNOSIS — H02834 Dermatochalasis of left upper eyelid: Secondary | ICD-10-CM | POA: Diagnosis not present

## 2016-02-10 DIAGNOSIS — R69 Illness, unspecified: Secondary | ICD-10-CM | POA: Diagnosis not present

## 2016-03-12 ENCOUNTER — Other Ambulatory Visit: Payer: Self-pay | Admitting: Internal Medicine

## 2016-03-16 ENCOUNTER — Other Ambulatory Visit: Payer: Self-pay | Admitting: Internal Medicine

## 2016-04-08 DIAGNOSIS — L820 Inflamed seborrheic keratosis: Secondary | ICD-10-CM | POA: Diagnosis not present

## 2016-04-08 DIAGNOSIS — D0462 Carcinoma in situ of skin of left upper limb, including shoulder: Secondary | ICD-10-CM | POA: Diagnosis not present

## 2016-04-08 DIAGNOSIS — C4499 Other specified malignant neoplasm of skin, unspecified: Secondary | ICD-10-CM | POA: Diagnosis not present

## 2016-04-08 DIAGNOSIS — C7642 Malignant neoplasm of left upper limb: Secondary | ICD-10-CM | POA: Diagnosis not present

## 2016-04-10 ENCOUNTER — Other Ambulatory Visit: Payer: Self-pay | Admitting: Internal Medicine

## 2016-04-17 ENCOUNTER — Other Ambulatory Visit: Payer: Self-pay

## 2016-04-17 DIAGNOSIS — C4499 Other specified malignant neoplasm of skin, unspecified: Secondary | ICD-10-CM

## 2016-04-17 DIAGNOSIS — C569 Malignant neoplasm of unspecified ovary: Secondary | ICD-10-CM

## 2016-04-19 ENCOUNTER — Other Ambulatory Visit: Payer: Self-pay | Admitting: Oncology

## 2016-04-20 ENCOUNTER — Ambulatory Visit (HOSPITAL_BASED_OUTPATIENT_CLINIC_OR_DEPARTMENT_OTHER): Payer: Medicare HMO | Admitting: Oncology

## 2016-04-20 ENCOUNTER — Telehealth: Payer: Self-pay | Admitting: Oncology

## 2016-04-20 ENCOUNTER — Other Ambulatory Visit (HOSPITAL_BASED_OUTPATIENT_CLINIC_OR_DEPARTMENT_OTHER): Payer: Medicare HMO

## 2016-04-20 ENCOUNTER — Encounter: Payer: Self-pay | Admitting: Oncology

## 2016-04-20 VITALS — BP 113/58 | HR 79 | Temp 98.2°F | Resp 18 | Ht 61.0 in | Wt 96.8 lb

## 2016-04-20 DIAGNOSIS — D649 Anemia, unspecified: Secondary | ICD-10-CM

## 2016-04-20 DIAGNOSIS — D696 Thrombocytopenia, unspecified: Secondary | ICD-10-CM | POA: Diagnosis not present

## 2016-04-20 DIAGNOSIS — C44609 Unspecified malignant neoplasm of skin of left upper limb, including shoulder: Secondary | ICD-10-CM

## 2016-04-20 DIAGNOSIS — M810 Age-related osteoporosis without current pathological fracture: Secondary | ICD-10-CM

## 2016-04-20 DIAGNOSIS — C569 Malignant neoplasm of unspecified ovary: Secondary | ICD-10-CM

## 2016-04-20 DIAGNOSIS — G8929 Other chronic pain: Secondary | ICD-10-CM

## 2016-04-20 DIAGNOSIS — M545 Low back pain: Secondary | ICD-10-CM

## 2016-04-20 DIAGNOSIS — Z853 Personal history of malignant neoplasm of breast: Secondary | ICD-10-CM

## 2016-04-20 DIAGNOSIS — E8989 Other postprocedural endocrine and metabolic complications and disorders: Secondary | ICD-10-CM

## 2016-04-20 DIAGNOSIS — C4499 Other specified malignant neoplasm of skin, unspecified: Secondary | ICD-10-CM

## 2016-04-20 DIAGNOSIS — R634 Abnormal weight loss: Secondary | ICD-10-CM

## 2016-04-20 DIAGNOSIS — Z1239 Encounter for other screening for malignant neoplasm of breast: Secondary | ICD-10-CM

## 2016-04-20 DIAGNOSIS — I890 Lymphedema, not elsewhere classified: Secondary | ICD-10-CM

## 2016-04-20 LAB — COMPREHENSIVE METABOLIC PANEL
ALT: 11 U/L (ref 0–55)
ANION GAP: 9 meq/L (ref 3–11)
AST: 15 U/L (ref 5–34)
Albumin: 3.6 g/dL (ref 3.5–5.0)
Alkaline Phosphatase: 38 U/L — ABNORMAL LOW (ref 40–150)
BUN: 24.2 mg/dL (ref 7.0–26.0)
CHLORIDE: 95 meq/L — AB (ref 98–109)
CO2: 30 meq/L — AB (ref 22–29)
CREATININE: 1.2 mg/dL — AB (ref 0.6–1.1)
Calcium: 9.3 mg/dL (ref 8.4–10.4)
EGFR: 41 mL/min/{1.73_m2} — ABNORMAL LOW (ref >90)
Glucose: 86 mg/dl (ref 70–140)
Potassium: 3.8 mEq/L (ref 3.5–5.1)
Sodium: 134 mEq/L — ABNORMAL LOW (ref 136–145)
Total Bilirubin: 1.29 mg/dL — ABNORMAL HIGH (ref 0.20–1.20)
Total Protein: 6.1 g/dL — ABNORMAL LOW (ref 6.4–8.3)

## 2016-04-20 LAB — CBC WITH DIFFERENTIAL/PLATELET
BASO%: 1.1 % (ref 0.0–2.0)
Basophils Absolute: 0 10*3/uL (ref 0.0–0.1)
EOS ABS: 0.1 10*3/uL (ref 0.0–0.5)
EOS%: 3.9 % (ref 0.0–7.0)
HCT: 29.8 % — ABNORMAL LOW (ref 34.8–46.6)
HEMOGLOBIN: 10.2 g/dL — AB (ref 11.6–15.9)
LYMPH%: 19.5 % (ref 14.0–49.7)
MCH: 32.2 pg (ref 25.1–34.0)
MCHC: 34.2 g/dL (ref 31.5–36.0)
MCV: 94.1 fL (ref 79.5–101.0)
MONO#: 0.4 10*3/uL (ref 0.1–0.9)
MONO%: 10.1 % (ref 0.0–14.0)
NEUT%: 65.4 % (ref 38.4–76.8)
NEUTROS ABS: 2.4 10*3/uL (ref 1.5–6.5)
PLATELETS: 137 10*3/uL — AB (ref 145–400)
RBC: 3.17 10*6/uL — ABNORMAL LOW (ref 3.70–5.45)
RDW: 12.8 % (ref 11.2–14.5)
WBC: 3.7 10*3/uL — AB (ref 3.9–10.3)
lymph#: 0.7 10*3/uL — ABNORMAL LOW (ref 0.9–3.3)

## 2016-04-20 MED ORDER — FERROUS FUMARATE 325 (106 FE) MG PO TABS: ORAL_TABLET | ORAL | Status: DC

## 2016-04-20 NOTE — Telephone Encounter (Signed)
appt made and avs printed °
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
## 2016-04-21 DIAGNOSIS — Z1239 Encounter for other screening for malignant neoplasm of breast: Secondary | ICD-10-CM | POA: Insufficient documentation

## 2016-04-21 DIAGNOSIS — R634 Abnormal weight loss: Secondary | ICD-10-CM | POA: Insufficient documentation

## 2016-05-11 DIAGNOSIS — R490 Dysphonia: Secondary | ICD-10-CM | POA: Diagnosis not present

## 2016-06-03 ENCOUNTER — Other Ambulatory Visit: Payer: Self-pay | Admitting: Internal Medicine

## 2016-06-04 NOTE — Telephone Encounter (Signed)
Rx refill sent to pharmacy. 

## 2016-06-16 DIAGNOSIS — E559 Vitamin D deficiency, unspecified: Secondary | ICD-10-CM | POA: Diagnosis not present

## 2016-06-16 DIAGNOSIS — R5383 Other fatigue: Secondary | ICD-10-CM | POA: Diagnosis not present

## 2016-06-16 DIAGNOSIS — M810 Age-related osteoporosis without current pathological fracture: Secondary | ICD-10-CM | POA: Diagnosis not present

## 2016-06-18 DIAGNOSIS — Z0000 Encounter for general adult medical examination without abnormal findings: Secondary | ICD-10-CM | POA: Diagnosis not present

## 2016-06-18 DIAGNOSIS — I10 Essential (primary) hypertension: Secondary | ICD-10-CM | POA: Diagnosis not present

## 2016-06-18 DIAGNOSIS — E784 Other hyperlipidemia: Secondary | ICD-10-CM | POA: Diagnosis not present

## 2016-06-18 DIAGNOSIS — M818 Other osteoporosis without current pathological fracture: Secondary | ICD-10-CM | POA: Diagnosis not present

## 2016-06-18 DIAGNOSIS — K219 Gastro-esophageal reflux disease without esophagitis: Secondary | ICD-10-CM | POA: Diagnosis not present

## 2016-06-24 DIAGNOSIS — M810 Age-related osteoporosis without current pathological fracture: Secondary | ICD-10-CM | POA: Diagnosis not present

## 2016-06-24 DIAGNOSIS — S22000D Wedge compression fracture of unspecified thoracic vertebra, subsequent encounter for fracture with routine healing: Secondary | ICD-10-CM | POA: Diagnosis not present

## 2016-06-25 ENCOUNTER — Ambulatory Visit
Admission: RE | Admit: 2016-06-25 | Discharge: 2016-06-25 | Disposition: A | Discharge status: Home / Self Care | Payer: Medicare HMO | Source: Ambulatory Visit | Attending: Oncology | Admitting: Oncology

## 2016-06-25 DIAGNOSIS — Z1231 Encounter for screening mammogram for malignant neoplasm of breast: Secondary | ICD-10-CM | POA: Diagnosis not present

## 2016-06-25 DIAGNOSIS — C4499 Other specified malignant neoplasm of skin, unspecified: Secondary | ICD-10-CM

## 2016-06-25 DIAGNOSIS — Z1239 Encounter for other screening for malignant neoplasm of breast: Secondary | ICD-10-CM

## 2016-07-01 IMAGING — RF DG ESOPHAGUS
7 of 9 series · 12 of 24 positions shown · non-contrast
Comparison: Chest x-ray dated 10/13/2015 and chest CT dated
10/01/2015

CLINICAL DATA: Dysphagia.  Left vocal cord paralysis.

EXAM:
ESOPHOGRAM / BARIUM SWALLOW / BARIUM TABLET STUDY
TECHNIQUE: Combined double contrast and single contrast examination performed
using effervescent crystals, thick barium liquid, and thin barium
liquid. The patient was observed attempting to swallow a 13 mm
barium sulphate tablet.
FLUOROSCOPY TIME:  Fluoroscopy Time:  1 minute 18 seconds

[Series 1: fluoro_barium 2fps_bw · 0.18mm/px · 1 of 11 frames shown (1 of 2)]
[frame 4/11]
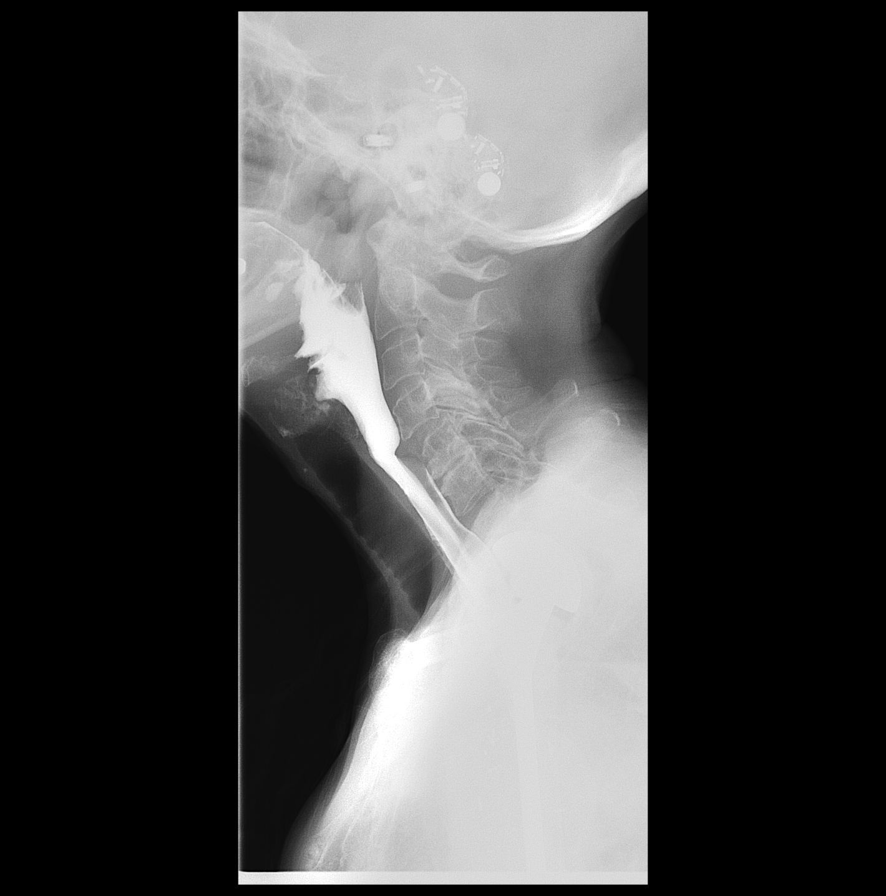

[Series 2: fluoro_barium 2fps_bw · 0.18mm/px · 2 of 20 frames shown (2 of 2)]
[frame 4/20]
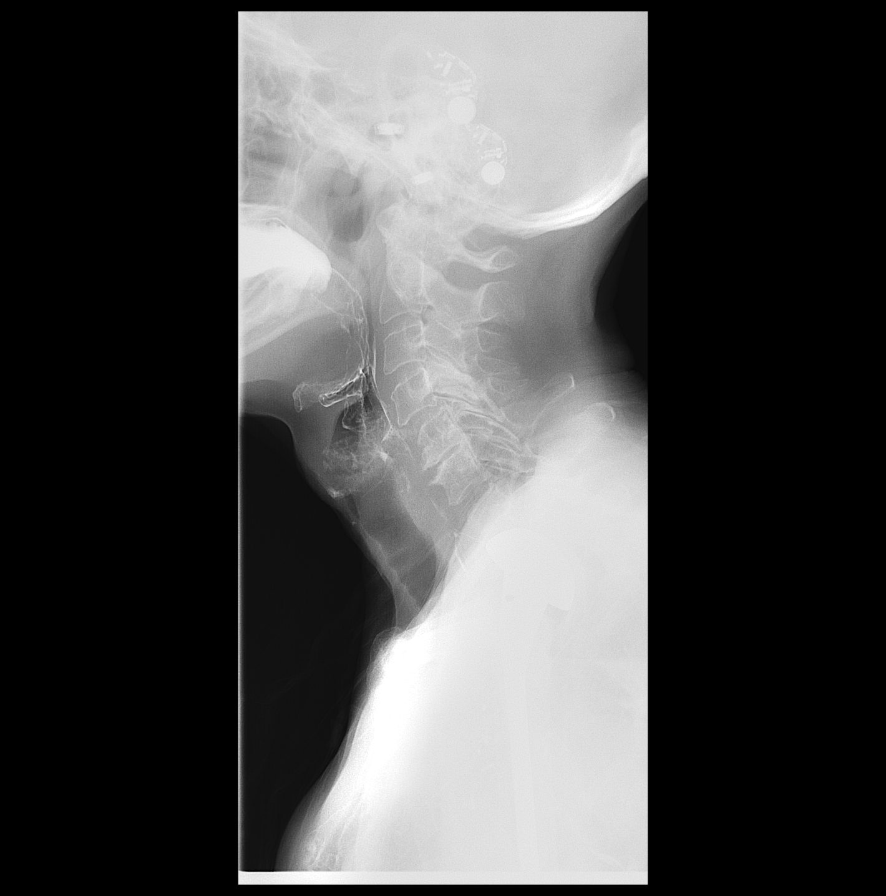
[frame 11/20]
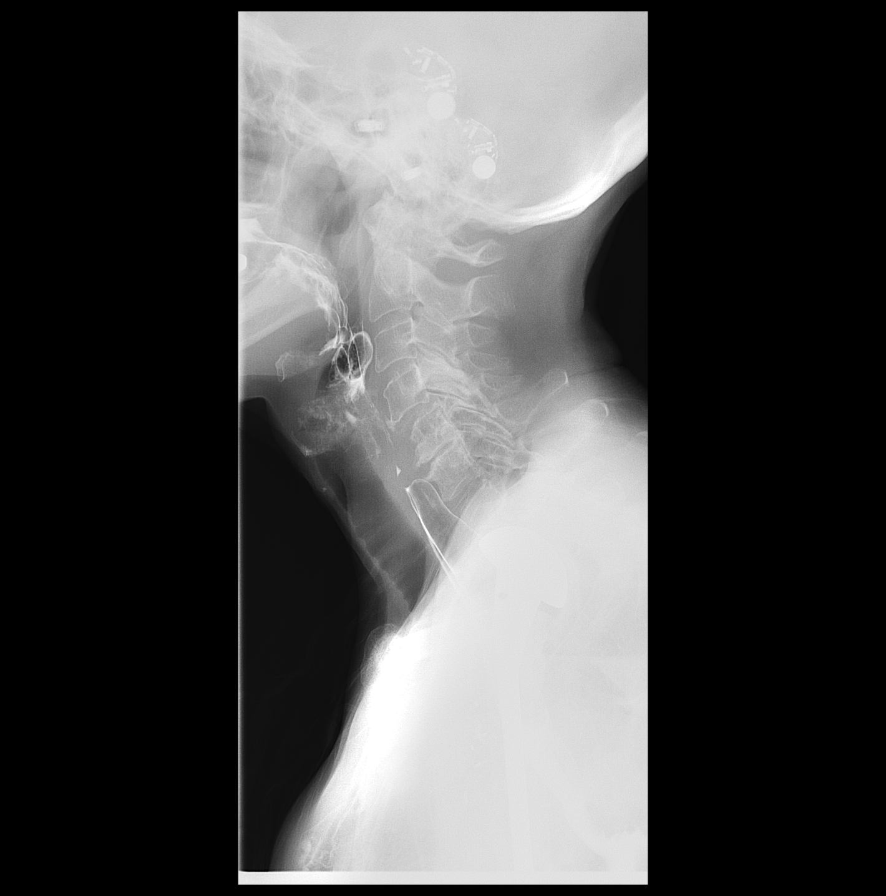

[Series 3: cp_standard · 0.56mm/px · 2 of 29 frames shown (1 of 5)]
[frame 5/29]
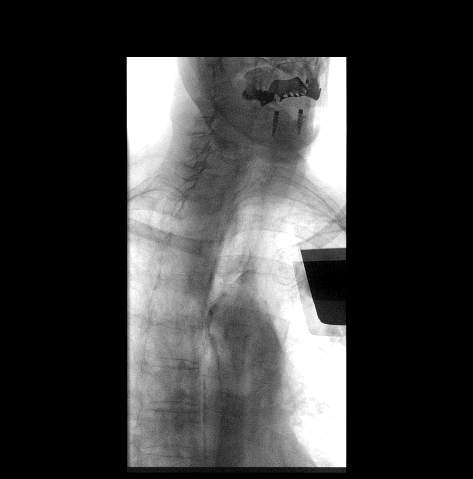
[frame 25/29]
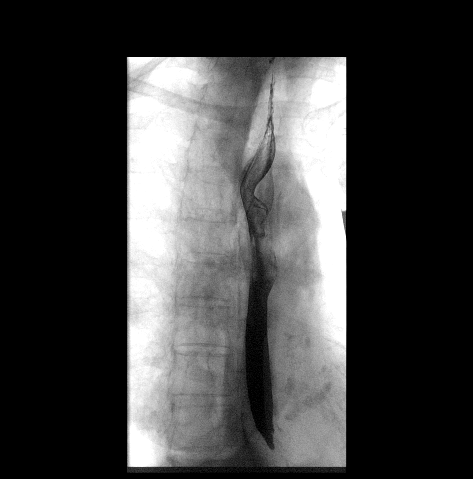

[Series 5: cp_standard · 0.56mm/px · 2 of 12 frames shown (2 of 5)]
[frame 2/12]
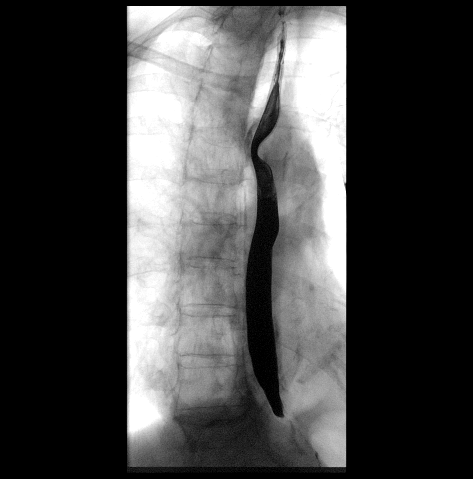
[frame 12/12]
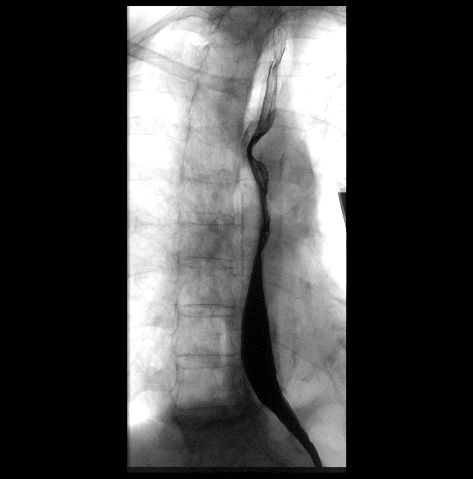

[Series 7: cp_standard · 0.60mm/px · 2 of 30 frames shown (3 of 5)]
[frame 5/30]
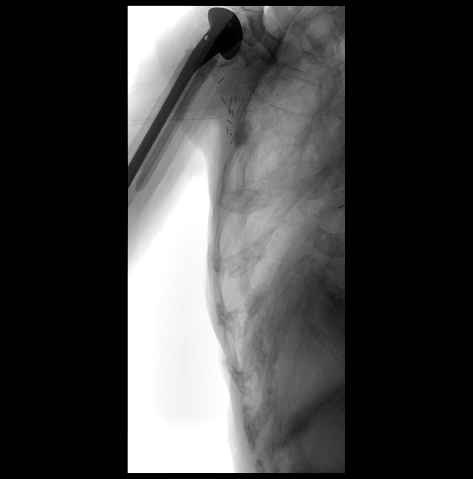
[frame 26/30]
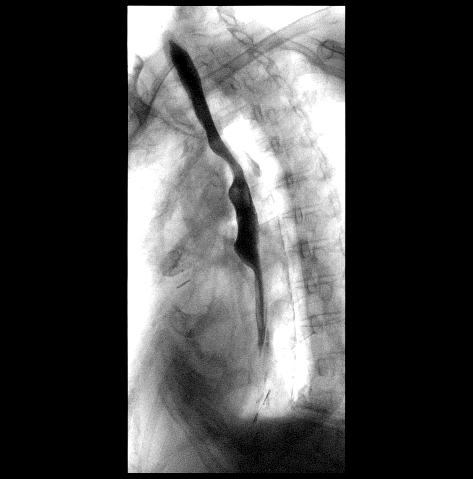

[Series 8: cp_standard · 0.61mm/px · 1 of 31 frames shown (4 of 5)]
[frame 16/31]
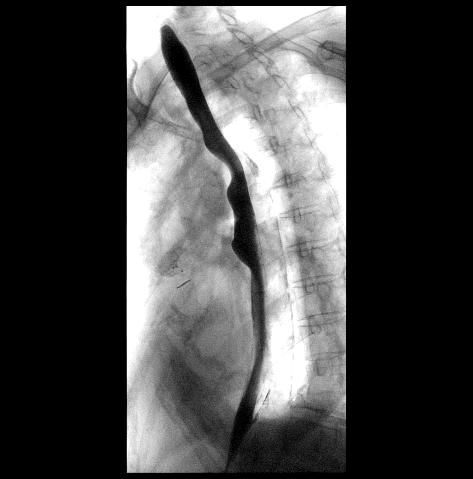

[Series 9: cp_standard · 0.61mm/px · 2 of 21 frames shown (5 of 5)]
[frame 4/21]
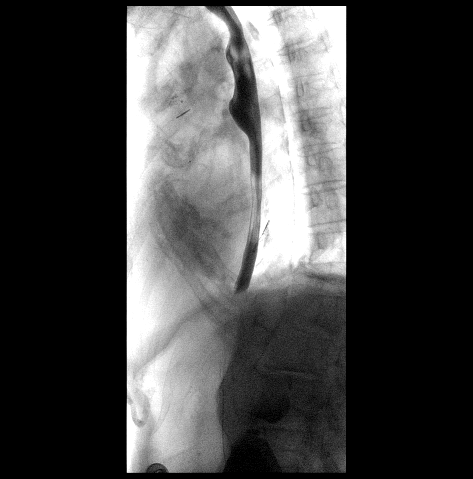
[frame 18/21]
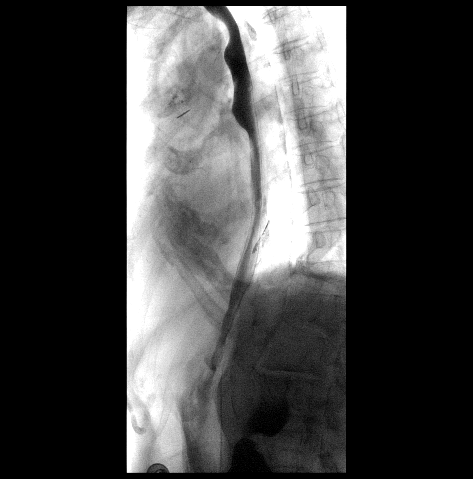

[12 of 24 positions shown; findings below may reference images not displayed]

FINDINGS: The oropharyngeal swallowing mechanisms are normal. There was no
aspiration of barium during this portion of the examination. The
mucosa of the esophagus is normal. Motility appears normal.

There are no mass lesions.  No esophagitis.

The patient attempted to swallow a 13 mm barium tablet. However, she
aspirated water on making an attempt and began wheezing. The patient
has emphysema.

She attempted swallowing the tablet 1 additional time after
recovering from the episode but she was not able to move the tablet
beyond her mouth.

Review of the prior CT scan demonstrates at the esophagus is is
compressed at several levels by the hyperinflated lungs particularly
at the level of the aortic arch and just above the aortic arch as
well as adjacent to the azygoesophageal recess.
IMPRESSION: 1. The esophagus appears normal.
2. The patient was unable to swallow the 13 mm barium tablet small I
was unable to determine if there is an area where food might become
lodged.
3. The esophagus is compressed by the hyperinflated lungs at several
levels as demonstrated on the prior CT scan of the chest. This might
create dysphagia. However, there is no true stricture.
4. The single episode of aspiration of water while attempting to
swallow the barium tablet is not felt to be clinically significant.
The patient denied that this has occurred in the past.

## 2016-07-10 ENCOUNTER — Ambulatory Visit: Payer: Medicare HMO | Admitting: Internal Medicine

## 2016-07-14 ENCOUNTER — Encounter: Payer: Self-pay | Admitting: Internal Medicine

## 2016-07-14 ENCOUNTER — Ambulatory Visit (INDEPENDENT_AMBULATORY_CARE_PROVIDER_SITE_OTHER): Payer: Medicare HMO | Admitting: Internal Medicine

## 2016-07-14 VITALS — BP 110/58 | HR 76 | Temp 97.9°F | Resp 18 | Ht 61.0 in | Wt 97.2 lb

## 2016-07-14 DIAGNOSIS — I10 Essential (primary) hypertension: Secondary | ICD-10-CM

## 2016-07-14 DIAGNOSIS — I890 Lymphedema, not elsewhere classified: Secondary | ICD-10-CM

## 2016-07-14 DIAGNOSIS — I420 Dilated cardiomyopathy: Secondary | ICD-10-CM

## 2016-07-14 DIAGNOSIS — E8989 Other postprocedural endocrine and metabolic complications and disorders: Secondary | ICD-10-CM

## 2016-07-14 DIAGNOSIS — Z23 Encounter for immunization: Secondary | ICD-10-CM | POA: Diagnosis not present

## 2016-07-14 DIAGNOSIS — Z299 Encounter for prophylactic measures, unspecified: Secondary | ICD-10-CM

## 2016-07-14 NOTE — Progress Notes (Signed)
Subjective:    Patient ID: Judy Herrera, female    DOB: 1931/10/27, 82 y.o.   MRN: [id number]  HPI 82 year old patient who is seen today for her six-month follow-up.  She is followed by oncology.  She has essential hypertension.  She has a history of dyslipidemia, coronary artery disease as well as dilated cardiomyopathy.  She is doing quite well without concerns or complaints.  Remains on statin therapy, which he tolerates well.  She has a history of osteoporosis as well as vitamin D deficiency.  No concerns or complaints  Past Medical History:  Diagnosis Date  . Allergy   . Arthritis   . Breast CA ([place])    NO BP OR STICKS IN LEFT ARM  . CAD (coronary artery disease)   . Cancer ([place])   . Cardiomyopathy ([place])    improved  . Cataract   . Diverticulosis   . Eccrine porocarcinoma of skin   . History of chemotherapy   . History of radiation therapy 1994   left chest wall  . Hyperlipidemia   . Hypertension   . Osteoporosis   . Ovarian ca ([place])   . Wears dentures    full bottom-partial top  . Wears glasses      Social History   Social History  . Marital status: Widowed    Spouse name: N/A  . Number of children: 1  . Years of education: N/A   Occupational History  . Not on file.   Social History Main Topics  . Smoking status: Never Smoker  . Smokeless tobacco: Never Used  . Alcohol use No  . Drug use: No  . Sexual activity: Not on file     Comment: didn't ask   Other Topics Concern  . Not on file   Social History Narrative  . No narrative on file    Past Surgical History:  Procedure Laterality Date  . ABDOMINAL HYSTERECTOMY  2004  . BREAST SURGERY    . CHOLECYSTECTOMY  1993  . EYE SURGERY     CATARACTS  . left breast mastectomy  1994   T3 N1 Mx  . MASS EXCISION Left 05/09/2014   Procedure: EXCISION OF NODULE LEFT CHEST ;  Surgeon: Pedro Earls, MD;  Location: [facility];  Service: General;  Laterality: Left;  Marland Kitchen MODIFIED RADICAL  MASTECTOMY W/ AXILLARY LYMPH NODE DISSECTION W/ PECTORALIS MINOR EXCISION  1994   Left  . right leg  1990   fx  . SHOULDER ARTHROSCOPY    . [place]   replacement-lt  . SKIN BIOPSY  04/27/2011   left hand, forearm and posterior arm    Family History  Problem Relation Age of Onset  . Breast cancer Sister     dx in her 46s  . Heart disease Sister   . Heart disease Father   . Heart disease Mother   . Stroke Mother   . Prostate cancer Maternal Uncle   . Stroke Maternal Grandmother   . Testicular cancer Maternal Grandfather   . Stroke Maternal Grandfather   . Stroke Paternal Grandmother   . Heart disease Paternal Grandfather   . Breast cancer Sister 79    BRCA2+  . Breast cancer Sister 52  . Prostate cancer Maternal Uncle   . Breast cancer Other     maternal great aunt dx later in life    Allergies  Allergen Reactions  . Ibuprofen Nausea Only  . Morphine Nausea And  Vomiting    Current Outpatient Prescriptions on File Prior to Visit  Medication Sig Dispense Refill  . aspirin EC 81 MG tablet Take 81 mg by mouth at bedtime.     . calcium carbonate (TUMS - DOSED IN MG ELEMENTAL CALCIUM) 500 MG chewable tablet Chew 2 tablets by mouth daily with supper.    . carvedilol (COREG) 25 MG tablet TAKE 1 TABLET BY MOUTH TWICE DAILY WITH FOOD 180 tablet 1  . Cholecalciferol (VITAMIN D) 2000 UNITS tablet Take 2,000 Units by mouth every morning.     . Coenzyme Q10 (CO Q 10) 100 MG CAPS Take 1 capsule by mouth every morning.     . denosumab (PROLIA) 60 MG/ML SOLN injection Inject 60 mg into the skin every 6 (six) months. Administer in upper arm, thigh, or abdomen    . ferrous fumarate (HEMOCYTE - 106 MG FE) 325 (106 Fe) MG TABS tablet Take 1 tablet by mouth 2-3 times a week as tolerated. 30 each 1  . fluticasone (FLONASE) 50 MCG/ACT nasal spray USE 2 SPRAYS INTO BOTH NOSTRILS DAILY 50 g 0  . lisinopril-hydrochlorothiazide (ZESTORETIC) 20-12.5 MG tablet Take 1 tablet by mouth  daily. 90 tablet 3  . loperamide (IMODIUM) 2 MG capsule Take 1 capsule (2 mg total) by mouth as needed for diarrhea or loose stools. 20 capsule 0  . pantoprazole (PROTONIX) 40 MG tablet TAKE 1 TABLET BY MOUTH EVERY DAY 90 tablet 1  . simvastatin (ZOCOR) 40 MG tablet TAKE ONE TABLET DAILY AT BEDTIME 90 tablet 3  . traMADol (ULTRAM) 50 MG tablet TAKE 1 TABLET BY MOUTH EVERY 6 HOURS AS NEEDED FOR PAIN 60 tablet 2   No current facility-administered medications on file prior to visit.     BP (!) 110/58 (BP Location: Right Arm, Patient Position: Sitting, Cuff Size: Normal)   Pulse 76   Temp 97.9 F (36.6 C) (Oral)   Resp 18   Ht _0  (1.549 m)   Wt 97 lb 4 oz (44.1 kg)   BMI 18.38 kg/m      Review of Systems  Constitutional: Negative.   HENT: Negative for congestion, dental problem, hearing loss, rhinorrhea, sinus pressure, sore throat and tinnitus.   Eyes: Negative for pain, discharge and visual disturbance.  Respiratory: Negative for cough and shortness of breath.   Cardiovascular: Negative for chest pain, palpitations and leg swelling.  Gastrointestinal: Negative for abdominal distention, abdominal pain, blood in stool, constipation, diarrhea, nausea and vomiting.  Genitourinary: Negative for difficulty urinating, dysuria, flank pain, frequency, hematuria, pelvic pain, urgency, vaginal bleeding, vaginal discharge and vaginal pain.  Musculoskeletal: Negative for arthralgias, gait problem and joint swelling.  Skin: Negative for rash.  Neurological: Negative for dizziness, syncope, speech difficulty, weakness, numbness and headaches.  Hematological: Negative for adenopathy.  Psychiatric/Behavioral: Negative for agitation, behavioral problems and dysphoric mood. The patient is not nervous/anxious.        Objective:   Physical Exam  Constitutional: She is oriented to person, place, and time. She appears well-developed and well-nourished.  HENT:  Head: Normocephalic.  Right Ear:  External ear normal.  Left Ear: External ear normal.  Mouth/Throat: Oropharynx is clear and moist.  Eyes: Conjunctivae and EOM are normal. Pupils are equal, round, and reactive to light.  Neck: Normal range of motion. Neck supple. No thyromegaly present.  Cardiovascular: Normal rate, regular rhythm, normal heart sounds and intact distal pulses.   Pulmonary/Chest: Effort normal and breath sounds normal.  Abdominal: Soft. Bowel sounds are  normal. She exhibits no mass. There is no tenderness.  Musculoskeletal: Normal range of motion.  Lymphadenopathy:    She has no cervical adenopathy.  Neurological: She is alert and oriented to person, place, and time.  Skin: Skin is warm and dry. No rash noted.  Chronic lymphedema, left arm  Psychiatric: She has a normal mood and affect. Her behavior is normal.          Assessment & Plan:   Essential hypertension, stable Dilated cardiomyopathy, compensated History of coronary artery disease, stable Osteoporosis Dyslipidemia.  Continue statin therapy  Follow-up oncology Recheck 6 months  Nyoka Cowden, MD

## 2016-07-14 NOTE — Patient Instructions (Signed)
Limit your sodium (Salt) intake  Please check your blood pressure on a regular basis.  If it is consistently greater than 150/90, please make an office appointment.  Take a calcium supplement, plus 800-1200 units of vitamin D  Return in 6 months for follow-up  

## 2016-07-14 NOTE — Progress Notes (Signed)
Pre visit review using our clinic review tool, if applicable. No additional management support is needed unless otherwise documented below in the visit note. 

## 2016-07-14 NOTE — Addendum Note (Signed)
Addended by: Anselm Pancoast on: 07/14/2016 08:28 AM   Modules accepted: Orders

## 2016-07-21 ENCOUNTER — Other Ambulatory Visit: Payer: Self-pay | Admitting: Internal Medicine

## 2016-07-27 DIAGNOSIS — Z85828 Personal history of other malignant neoplasm of skin: Secondary | ICD-10-CM | POA: Diagnosis not present

## 2016-08-18 ENCOUNTER — Encounter: Payer: Self-pay | Admitting: *Deleted

## 2016-08-18 ENCOUNTER — Emergency Department (INDEPENDENT_AMBULATORY_CARE_PROVIDER_SITE_OTHER)
Admission: EM | Admit: 2016-08-18 | Discharge: 2016-08-18 | Disposition: A | Discharge status: Home / Self Care | Payer: Medicare HMO | Source: Home / Self Care

## 2016-08-18 DIAGNOSIS — Z23 Encounter for immunization: Secondary | ICD-10-CM

## 2016-08-18 MED ORDER — INFLUENZA VAC SPLIT QUAD 0.5 ML IM SUSY
0.5000 mL | PREFILLED_SYRINGE | Freq: Once | INTRAMUSCULAR | Status: AC
  Administered 2016-08-18: 0.5 mL via INTRAMUSCULAR

## 2016-08-18 NOTE — ED Triage Notes (Signed)
Pt is here for flu vaccine only

## 2016-08-20 ENCOUNTER — Emergency Department (INDEPENDENT_AMBULATORY_CARE_PROVIDER_SITE_OTHER)
Admission: EM | Admit: 2016-08-20 | Discharge: 2016-08-20 | Disposition: A | Discharge status: Home / Self Care | Payer: Medicare HMO | Source: Home / Self Care | Attending: Family Medicine | Admitting: Family Medicine

## 2016-08-20 ENCOUNTER — Encounter: Payer: Self-pay | Admitting: *Deleted

## 2016-08-20 DIAGNOSIS — R300 Dysuria: Secondary | ICD-10-CM | POA: Diagnosis not present

## 2016-08-20 DIAGNOSIS — N3090 Cystitis, unspecified without hematuria: Secondary | ICD-10-CM

## 2016-08-20 LAB — POCT URINALYSIS DIP (MANUAL ENTRY)
GLUCOSE UA: NEGATIVE
Nitrite, UA: NEGATIVE
Protein Ur, POC: 30 — AB
Spec Grav, UA: 1.02 (ref 1.005–1.03)
Urobilinogen, UA: 0.2 (ref 0–1)
pH, UA: 5.5 (ref 5–8)

## 2016-08-20 MED ORDER — CEPHALEXIN 500 MG PO CAPS: 500.0000 mg | ORAL_CAPSULE | Freq: Two times a day (BID) | ORAL | 0 refills | Status: DC

## 2016-08-20 NOTE — Discharge Instructions (Signed)
Increase fluid intake. May use non-prescription AZO for about two days, if desired, to decrease urinary discomfort.  If symptoms become significantly worse during the night or over the weekend, proceed to the local emergency room.  

## 2016-08-20 NOTE — ED Provider Notes (Signed)
[name]    CSN: [id number] Arrival date & time: 08/20/16  1546     History   Chief Complaint Chief Complaint  Patient presents with  . Dysuria    HPI Judy Herrera is a 82 y.o. female.   Patient complains of four hour history of sudden onset dysuria and frequency.  She feels well otherwise.   The history is provided by the patient.  Dysuria  Pain quality:  Burning Pain severity:  Mild Onset quality:  Sudden Duration:  4 hours Timing:  Constant Progression:  Worsening Chronicity:  New Recent urinary tract infections: no   Relieved by:  None tried Worsened by:  Nothing Ineffective treatments:  None tried Urinary symptoms: frequent urination and hesitancy   Urinary symptoms: no discolored urine, no foul-smelling urine, no hematuria and no bladder incontinence   Associated symptoms: no abdominal pain, no fever, no flank pain, no genital lesions, no nausea, no vaginal discharge and no vomiting     Past Medical History:  Diagnosis Date  . Allergy   . Arthritis   . Breast CA ([place])    NO BP OR STICKS IN LEFT ARM  . CAD (coronary artery disease)   . Cancer ([place])   . Cardiomyopathy ([place])    improved  . Cataract   . Diverticulosis   . Eccrine porocarcinoma of skin   . History of chemotherapy   . History of radiation therapy 1994   left chest wall  . Hyperlipidemia   . Hypertension   . Osteoporosis   . Ovarian ca ([place])   . Wears dentures    full bottom-partial top  . Wears glasses     Patient Active Problem List   Diagnosis Date Noted  . Breast cancer screening, high risk patient 04/21/2016  . Unintentional weight loss 04/21/2016  . Dyspnea 10/04/2015  . Ascending aortic aneurysm ([place]) 10/04/2015  . Lymphedema of upper extremity following lymphadenectomy 04/19/2015  . Thrombocytopenia ([place]) 04/19/2015  . Absolute anemia 04/19/2015  . Ovarian cancer ([place]) 04/19/2015  . Unilateral vocal cord paralysis 04/19/2015  . Portacath in place  04/19/2015  . Ovarian epithelial cancer ([place]) 09/04/2014  . Faintness 08/17/2014  . LBBB (left bundle branch block) 01/12/2014  . 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 TENDINITIS, RIGHT WRIST 11/12/2010  . WRIST PAIN, RIGHT 11/05/2010  . SYNCOPE 09/23/2010  . BENIGN NEOPLASM SKIN OTHER&UNSPEC PARTS FACE 08/06/2010  . BACK PAIN 07/03/2010  . Hyperlipidemia 01/15/2010  . Congestive dilated cardiomyopathy ([place]) 01/15/2010  . Allergic rhinitis 08/22/2009  . OSTEOPOROSIS 02/16/2008  . COPD (chronic obstructive pulmonary disease) ([place]) 02/04/2008  . Essential hypertension 07/13/2007  . Coronary atherosclerosis 07/13/2007  . BREAST CANCER, HX OF 07/13/2007  . COLONIC POLYPS, HX OF 07/13/2007    Past Surgical History:  Procedure Laterality Date  . ABDOMINAL HYSTERECTOMY  2004  . BREAST SURGERY    . CHOLECYSTECTOMY  1993  . EYE SURGERY     CATARACTS  . left breast mastectomy  1994   T3 N1 Mx  . MASS EXCISION Left 05/09/2014   Procedure: EXCISION OF NODULE LEFT CHEST ;  Surgeon: Pedro Earls, MD;  Location: [facility];  Service: General;  Laterality: Left;  Marland Kitchen MODIFIED RADICAL MASTECTOMY W/ AXILLARY LYMPH NODE DISSECTION W/ PECTORALIS MINOR EXCISION  1994   Left  . right leg  1990   fx  . SHOULDER ARTHROSCOPY    . [place]   replacement-lt  . SKIN  BIOPSY  04/27/2011   left hand, forearm and posterior arm    OB History    No data available       Home Medications    Prior to Admission medications   Medication Sig Start Date End Date Taking? Authorizing Provider  aspirin EC 81 MG tablet Take 81 mg by mouth at bedtime.     Historical Provider, MD  calcium carbonate (TUMS - DOSED IN MG ELEMENTAL CALCIUM) 500 MG chewable tablet Chew 2 tablets by mouth daily with supper.    Historical Provider, MD  carvedilol (COREG) 25 MG tablet TAKE 1 TABLET BY MOUTH TWICE DAILY WITH FOOD 03/16/16   Marletta Lor, MD  cephALEXin (KEFLEX) 500 MG capsule  Take 1 capsule (500 mg total) by mouth 2 (two) times daily. 08/20/16   Kandra Nicolas, MD  Cholecalciferol (VITAMIN D) 2000 UNITS tablet Take 2,000 Units by mouth every morning.     Historical Provider, MD  Coenzyme Q10 (CO Q 10) 100 MG CAPS Take 1 capsule by mouth every morning.     Historical Provider, MD  denosumab (PROLIA) 60 MG/ML SOLN injection Inject 60 mg into the skin every 6 (six) months. Administer in upper arm, thigh, or abdomen    Historical Provider, MD  ferrous fumarate (HEMOCYTE - 106 MG FE) 325 (106 Fe) MG TABS tablet Take 1 tablet by mouth 2-3 times a week as tolerated. 04/20/16   Lennis Marion Downer, MD  fluticasone (FLONASE) 50 MCG/ACT nasal spray USE 2 SPRAYS INTO BOTH NOSTRILS DAILY 06/04/16   Marletta Lor, MD  lisinopril-hydrochlorothiazide (ZESTORETIC) 20-12.5 MG tablet Take 1 tablet by mouth daily. 09/11/15   Marletta Lor, MD  loperamide (IMODIUM) 2 MG capsule Take 1 capsule (2 mg total) by mouth as needed for diarrhea or loose stools. 01/24/13   Lennis Marion Downer, MD  pantoprazole (PROTONIX) 40 MG tablet TAKE 1 TABLET BY MOUTH EVERY DAY 04/10/16   Marletta Lor, MD  simvastatin (ZOCOR) 40 MG tablet TAKE ONE TABLET DAILY AT BEDTIME 10/16/15   Marletta Lor, MD  traMADol (ULTRAM) 50 MG tablet TAKE 1 TABLET BY MOUTH EVERY 6 HOURS AS NEEDED FOR PAIN 07/22/16   Marletta Lor, MD    Family History Family History  Problem Relation Age of Onset  . Breast cancer Sister     dx in her 16s  . Heart disease Sister   . Heart disease Father   . Heart disease Mother   . Stroke Mother   . Prostate cancer Maternal Uncle   . Stroke Maternal Grandmother   . Testicular cancer Maternal Grandfather   . Stroke Maternal Grandfather   . Stroke Paternal Grandmother   . Heart disease Paternal Grandfather   . Breast cancer Sister 47    BRCA2+  . Breast cancer Sister 43  . Prostate cancer Maternal Uncle   . Breast cancer Other     maternal great aunt dx later in life     Social History Social History  Substance Use Topics  . Smoking status: Never Smoker  . Smokeless tobacco: Never Used  . Alcohol use No     Allergies   Ibuprofen and Morphine   Review of Systems Review of Systems  Constitutional: Negative for fever.  Gastrointestinal: Negative for abdominal pain, nausea and vomiting.  Genitourinary: Positive for dysuria. Negative for flank pain and vaginal discharge.  All other systems reviewed and are negative.    Physical Exam Triage Vital Signs ED Triage  Vitals  Enc Vitals Group     BP 08/20/16 1609 127/61     Pulse Rate 08/20/16 1609 71     Resp 08/20/16 1609 14     Temp 08/20/16 1609 98.2 F (36.8 C)     Temp Source 08/20/16 1609 Oral     SpO2 08/20/16 1609 95 %     Weight 08/20/16 1610 96 lb (43.5 kg)     Height --      Head Circumference --      Peak Flow --      Pain Score 08/20/16 1610 0     Pain Loc --      Pain Edu? --      Excl. in [place]? --    No data found.   Updated Vital Signs BP 127/61 (BP Location: Right Arm)   Pulse 71   Temp 98.2 F (36.8 C) (Oral)   Resp 14   Wt 96 lb (43.5 kg)   SpO2 95%   BMI 18.14 kg/m   Visual Acuity Right Eye Distance:   Left Eye Distance:   Bilateral Distance:    Right Eye Near:   Left Eye Near:    Bilateral Near:     Physical Exam Nursing notes and Vital Signs reviewed. Appearance:  Patient appears stated age, and in no acute distress.    Eyes:  Pupils are equal, round, and reactive to light and accomodation.  Extraocular movement is intact.  Conjunctivae are not inflamed   Pharynx:  Normal; moist mucous membranes  Neck:  Supple.  No adenopathy Lungs:  Clear to auscultation.  Breath sounds are equal.  Moving air well. Heart:  Regular rate and rhythm without murmurs, rubs, or gallops.  Abdomen:  Nontender without masses or hepatosplenomegaly.  Bowel sounds are present.  No CVA or flank tenderness.  Extremities:  No edema.  Skin:  No rash present.     UC  Treatments / Results  Labs (all labs ordered are listed, but only abnormal results are displayed) Labs Reviewed  POCT URINALYSIS DIP (MANUAL ENTRY) - Abnormal; Notable for the following:       Result Value   Bilirubin, UA small (*)    Ketones, POC UA small (15) (*)    Blood, UA moderate (*)    Protein Ur, POC =30 (*)    Leukocytes, UA large (3+) (*)    All other components within normal limits  URINE CULTURE    EKG  EKG Interpretation None       Radiology No results found.  Procedures Procedures (including critical care time)  Medications Ordered in UC Medications - No data to display   Initial Impression / Assessment and Plan / UC Course  I have reviewed the triage vital signs and the nursing notes.  Pertinent labs & imaging results that were available during my care of the patient were reviewed by me and considered in my medical decision making (see chart for details).  Clinical Course  Urine culture pending. Begin Keflex 574m BID for one week. Increase fluid intake. If symptoms become significantly worse during the night or over the weekend, proceed to the local emergency room.  Followup with Family Doctor if not improved in one week.  Increase fluid intake.  May use non-prescription AZO for about two days, if desired, to decrease urinary discomfort      Final Clinical Impressions(s) / UC Diagnoses   Final diagnoses:  Dysuria  Cystitis    New Prescriptions New Prescriptions  CEPHALEXIN (KEFLEX) 500 MG CAPSULE    Take 1 capsule (500 mg total) by mouth 2 (two) times daily.     Kandra Nicolas, MD 08/23/16 [phone]

## 2016-08-20 NOTE — ED Triage Notes (Signed)
Pt c/o dysuria x today. Denies any other associated symptoms.

## 2016-08-22 ENCOUNTER — Telehealth: Payer: Self-pay | Admitting: Emergency Medicine

## 2016-08-23 LAB — URINE CULTURE

## 2016-09-01 ENCOUNTER — Other Ambulatory Visit: Payer: Self-pay | Admitting: Internal Medicine

## 2016-09-09 DIAGNOSIS — R69 Illness, unspecified: Secondary | ICD-10-CM | POA: Diagnosis not present

## 2016-09-14 ENCOUNTER — Other Ambulatory Visit: Payer: Self-pay | Admitting: Internal Medicine

## 2016-09-16 ENCOUNTER — Other Ambulatory Visit: Payer: Self-pay | Admitting: Internal Medicine

## 2016-09-21 ENCOUNTER — Other Ambulatory Visit: Payer: Self-pay | Admitting: Internal Medicine

## 2016-10-11 ENCOUNTER — Other Ambulatory Visit: Payer: Self-pay | Admitting: Oncology

## 2016-10-11 DIAGNOSIS — C4499 Other specified malignant neoplasm of skin, unspecified: Secondary | ICD-10-CM

## 2016-10-19 ENCOUNTER — Ambulatory Visit (HOSPITAL_BASED_OUTPATIENT_CLINIC_OR_DEPARTMENT_OTHER): Payer: Medicare HMO | Admitting: Oncology

## 2016-10-19 ENCOUNTER — Other Ambulatory Visit (HOSPITAL_BASED_OUTPATIENT_CLINIC_OR_DEPARTMENT_OTHER): Payer: Medicare HMO

## 2016-10-19 ENCOUNTER — Encounter: Payer: Self-pay | Admitting: Oncology

## 2016-10-19 ENCOUNTER — Other Ambulatory Visit: Payer: Self-pay | Admitting: Internal Medicine

## 2016-10-19 VITALS — BP 141/65 | HR 70 | Temp 97.7°F | Resp 17 | Wt 98.5 lb

## 2016-10-19 DIAGNOSIS — D649 Anemia, unspecified: Secondary | ICD-10-CM | POA: Diagnosis not present

## 2016-10-19 DIAGNOSIS — R634 Abnormal weight loss: Secondary | ICD-10-CM

## 2016-10-19 DIAGNOSIS — C44609 Unspecified malignant neoplasm of skin of left upper limb, including shoulder: Secondary | ICD-10-CM

## 2016-10-19 DIAGNOSIS — D696 Thrombocytopenia, unspecified: Secondary | ICD-10-CM

## 2016-10-19 DIAGNOSIS — Z853 Personal history of malignant neoplasm of breast: Secondary | ICD-10-CM | POA: Diagnosis not present

## 2016-10-19 DIAGNOSIS — C4499 Other specified malignant neoplasm of skin, unspecified: Secondary | ICD-10-CM

## 2016-10-19 DIAGNOSIS — E8989 Other postprocedural endocrine and metabolic complications and disorders: Secondary | ICD-10-CM

## 2016-10-19 DIAGNOSIS — Z1239 Encounter for other screening for malignant neoplasm of breast: Secondary | ICD-10-CM

## 2016-10-19 DIAGNOSIS — Z803 Family history of malignant neoplasm of breast: Secondary | ICD-10-CM

## 2016-10-19 DIAGNOSIS — Z8543 Personal history of malignant neoplasm of ovary: Secondary | ICD-10-CM

## 2016-10-19 DIAGNOSIS — M810 Age-related osteoporosis without current pathological fracture: Secondary | ICD-10-CM

## 2016-10-19 DIAGNOSIS — I890 Lymphedema, not elsewhere classified: Secondary | ICD-10-CM

## 2016-10-19 LAB — CBC WITH DIFFERENTIAL/PLATELET
BASO%: 0.9 % (ref 0.0–2.0)
Basophils Absolute: 0 10*3/uL (ref 0.0–0.1)
EOS ABS: 0.1 10*3/uL (ref 0.0–0.5)
EOS%: 2.6 % (ref 0.0–7.0)
HEMATOCRIT: 31 % — AB (ref 34.8–46.6)
HEMOGLOBIN: 10.4 g/dL — AB (ref 11.6–15.9)
LYMPH#: 0.7 10*3/uL — AB (ref 0.9–3.3)
LYMPH%: 16.1 % (ref 14.0–49.7)
MCH: 31.4 pg (ref 25.1–34.0)
MCHC: 33.6 g/dL (ref 31.5–36.0)
MCV: 93.3 fL (ref 79.5–101.0)
MONO#: 0.4 10*3/uL (ref 0.1–0.9)
MONO%: 9.3 % (ref 0.0–14.0)
NEUT%: 71.1 % (ref 38.4–76.8)
NEUTROS ABS: 2.9 10*3/uL (ref 1.5–6.5)
Platelets: 151 10*3/uL (ref 145–400)
RBC: 3.33 10*6/uL — ABNORMAL LOW (ref 3.70–5.45)
RDW: 13.6 % (ref 11.2–14.5)
WBC: 4.1 10*3/uL (ref 3.9–10.3)
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
## 2016-11-02 ENCOUNTER — Encounter: Payer: Self-pay | Admitting: *Deleted

## 2016-11-02 ENCOUNTER — Emergency Department
Admission: EM | Admit: 2016-11-02 | Discharge: 2016-11-02 | Disposition: A | Discharge status: Home / Self Care | Payer: Medicare HMO | Source: Home / Self Care | Attending: Family Medicine | Admitting: Family Medicine

## 2016-11-02 DIAGNOSIS — R300 Dysuria: Secondary | ICD-10-CM

## 2016-11-02 DIAGNOSIS — N3090 Cystitis, unspecified without hematuria: Secondary | ICD-10-CM

## 2016-11-02 LAB — POCT URINALYSIS DIP (MANUAL ENTRY)
Glucose, UA: 250 — AB
Nitrite, UA: POSITIVE — AB
Urobilinogen, UA: >=8 (ref 0–1)
pH, UA: 5 (ref 5–8)

## 2016-11-02 MED ORDER — AMOXICILLIN-POT CLAVULANATE 500-125 MG PO TABS: ORAL_TABLET | ORAL | 0 refills | Status: DC

## 2016-11-02 NOTE — Discharge Instructions (Signed)
Increase fluid intake. °If symptoms become significantly worse during the night or over the weekend, proceed to the local emergency room.  °

## 2016-11-02 NOTE — ED Provider Notes (Signed)
Judy Herrera CARE    CSN: [id number] Arrival date & time: 11/02/16  1441     History   Chief Complaint Chief Complaint  Patient presents with  . Dysuria    HPI Judy Herrera is a 82 y.o. female.   Last night patient developed dysuria and urgency.  She denies fever, abdominal or pelvic pain.  No nausea/vomiting.  No fevers, chills, and sweats.                                                                                                                                                                                                                                                                               The history is provided by the patient.  Dysuria  Pain quality:  Burning Pain severity:  Mild Onset quality:  Sudden Duration:  1 day Timing:  Constant Progression:  Worsening Chronicity:  Recurrent Recent urinary tract infections: yes   Relieved by:  Phenazopyridine Worsened by:  Nothing Ineffective treatments:  Phenazopyridine Urinary symptoms: frequent urination and hesitancy   Urinary symptoms: no discolored urine, no foul-smelling urine, no hematuria and no bladder incontinence   Associated symptoms: no abdominal pain, no fever, no flank pain, no nausea and no vomiting   Risk factors: recurrent urinary tract infections     Past Medical History:  Diagnosis Date  . Allergy   . Arthritis   . Breast CA ([place])    NO BP OR STICKS IN LEFT ARM  . CAD (coronary artery disease)   . Cancer ([place])   . Cardiomyopathy ([place])    improved  . Cataract   . Diverticulosis   . Eccrine porocarcinoma of skin   . History of chemotherapy   . History of radiation therapy 1994   left chest wall  . Hyperlipidemia   . Hypertension   . Osteoporosis   . Ovarian ca ([place])   . Wears dentures    full bottom-partial top  . Wears glasses     Patient Active Problem List   Diagnosis Date Noted  . Breast cancer screening, high risk patient 04/21/2016  . Unintentional  weight loss 04/21/2016  . Dyspnea 10/04/2015  . Ascending aortic aneurysm ([place])  10/04/2015  . Lymphedema of upper extremity following lymphadenectomy 04/19/2015  . Thrombocytopenia ([place]) 04/19/2015  . Absolute anemia 04/19/2015  . Ovarian cancer ([place]) 04/19/2015  . Unilateral vocal cord paralysis 04/19/2015  . Portacath in place 04/19/2015  . 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 Faintness 08/17/2014  . LBBB (left bundle branch block) 01/12/2014  . 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 TENDINITIS, RIGHT WRIST 11/12/2010  . WRIST PAIN, RIGHT 11/05/2010  . SYNCOPE 09/23/2010  . BENIGN NEOPLASM SKIN OTHER&UNSPEC PARTS FACE 08/06/2010  . BACK PAIN 07/03/2010  . Hyperlipidemia 01/15/2010  . Congestive dilated cardiomyopathy ([place]) 01/15/2010  . Allergic rhinitis 08/22/2009  . OSTEOPOROSIS 02/16/2008  . COPD (chronic obstructive pulmonary disease) ([place]) 02/04/2008  . Essential hypertension 07/13/2007  . Coronary atherosclerosis 07/13/2007  . BREAST CANCER, HX OF 07/13/2007  . COLONIC POLYPS, HX OF 07/13/2007    Past Surgical History:  Procedure Laterality Date  . ABDOMINAL HYSTERECTOMY  2004  . BREAST SURGERY    . CHOLECYSTECTOMY  1993  . EYE SURGERY     CATARACTS  . left breast mastectomy  1994   T3 N1 Mx  . MASS EXCISION Left 05/09/2014   Procedure: EXCISION OF NODULE LEFT CHEST ;  Surgeon: Pedro Earls, MD;  Location: [facility];  Service: General;  Laterality: Left;  Marland Kitchen MODIFIED RADICAL MASTECTOMY W/ AXILLARY LYMPH NODE DISSECTION W/ PECTORALIS MINOR EXCISION  1994   Left  . right leg  1990   fx  . SHOULDER ARTHROSCOPY    . [place]   replacement-lt  . SKIN BIOPSY  04/27/2011   left hand, forearm and posterior arm    OB History    No data available       Home Medications    Prior to Admission medications   Medication Sig Start Date End Date Taking? Authorizing Provider  amoxicillin-clavulanate (AUGMENTIN) 500-125 MG  tablet Take one tab by mouth twice daily 11/02/16   Kandra Nicolas, MD  aspirin EC 81 MG tablet Take 81 mg by mouth at bedtime.     Historical Provider, MD  calcium carbonate (TUMS - DOSED IN MG ELEMENTAL CALCIUM) 500 MG chewable tablet Chew 2 tablets by mouth daily with supper.    Historical Provider, MD  carvedilol (COREG) 25 MG tablet TAKE 1 TABLET BY MOUTH TWICE DAILY WITH FOOD 09/22/16   Marletta Lor, MD  Cholecalciferol (VITAMIN D) 2000 UNITS tablet Take 2,000 Units by mouth every morning.     Historical Provider, MD  Coenzyme Q10 (CO Q 10) 100 MG CAPS Take 1 capsule by mouth every morning.     Historical Provider, MD  denosumab (PROLIA) 60 MG/ML SOLN injection Inject 60 mg into the skin every 6 (six) months. Administer in upper arm, thigh, or abdomen    Historical Provider, MD  ferrous fumarate (HEMOCYTE - 106 MG FE) 325 (106 Fe) MG TABS tablet Take 1 tablet by mouth 2-3 times a week as tolerated. 04/20/16   Lennis Marion Downer, MD  fluticasone (FLONASE) 50 MCG/ACT nasal spray SHAKE LIQUID AND USE 2 SPRAYS IN [facility] NOSTRIL DAILY 09/15/16   Marletta Lor, MD  lisinopril-hydrochlorothiazide [facility]) 20-12.5 MG tablet TAKE 1 TABLET BY MOUTH EVERY DAY 09/01/16   Marletta Lor, MD  loperamide (IMODIUM) 2 MG capsule Take 1 capsule (2 mg total) by mouth as needed for diarrhea or loose stools. 01/24/13   Lennis Marion Downer, MD  pantoprazole (PROTONIX) 40 MG tablet TAKE 1  TABLET BY MOUTH EVERY DAY 09/16/16   Marletta Lor, MD  simvastatin (ZOCOR) 40 MG tablet TAKE 1 TABLET BY MOUTH EVERY NIGHT AT BEDTIME 10/20/16   Marletta Lor, MD  traMADol (ULTRAM) 50 MG tablet TAKE 1 TABLET BY MOUTH EVERY 6 HOURS AS NEEDED FOR PAIN 07/22/16   Marletta Lor, MD    Family History Family History  Problem Relation Age of Onset  . Breast cancer Sister     dx in her 67s  . Heart disease Sister   . Heart disease Father   . Heart disease Mother   . Stroke Mother   . Prostate  cancer Maternal Uncle   . Stroke Maternal Grandmother   . Testicular cancer Maternal Grandfather   . Stroke Maternal Grandfather   . Stroke Paternal Grandmother   . Heart disease Paternal Grandfather   . Breast cancer Sister 76    BRCA2+  . Breast cancer Sister 57  . Prostate cancer Maternal Uncle   . Breast cancer Other     maternal great aunt dx later in life    Social History Social History  Substance Use Topics  . Smoking status: Never Smoker  . Smokeless tobacco: Never Used  . Alcohol use No     Allergies   Ibuprofen and Morphine   Review of Systems Review of Systems  Constitutional: Negative for fever.  Gastrointestinal: Negative for abdominal pain, nausea and vomiting.  Genitourinary: Positive for dysuria. Negative for flank pain.  All other systems reviewed and are negative.    Physical Exam Triage Vital Signs ED Triage Vitals  Enc Vitals Group     BP 11/02/16 1505 102/57     Pulse Rate 11/02/16 1505 85     Resp 11/02/16 1505 16     Temp 11/02/16 1505 98.1 F (36.7 C)     Temp Source 11/02/16 1505 Oral     SpO2 11/02/16 1505 100 %     Weight 11/02/16 1506 99 lb (44.9 kg)     Height 11/02/16 1506 5' 2"  (1.575 m)     Head Circumference --      Peak Flow --      Pain Score 11/02/16 1507 0     Pain Loc --      Pain Edu? --      Excl. in [place]? --    No data found.   Updated Vital Signs BP 102/57 (BP Location: Left Arm)   Pulse 85   Temp 98.1 F (36.7 C) (Oral)   Resp 16   Ht 5' 2"  (1.575 m)   Wt 99 lb (44.9 kg)   SpO2 100%   BMI 18.11 kg/m   Visual Acuity Right Eye Distance:   Left Eye Distance:   Bilateral Distance:    Right Eye Near:   Left Eye Near:    Bilateral Near:     Physical Exam Nursing notes and Vital Signs reviewed. Appearance:  Patient appears stated age, and in no acute distress.    Eyes:  Pupils are equal, round, and reactive to light and accomodation.  Extraocular movement is intact.  Conjunctivae are not inflamed     Pharynx:  Normal; moist mucous membranes  Neck:  Supple.  No adenopathy Lungs:  Clear to auscultation.  Breath sounds are equal.  Moving air well. Heart:  Regular rate and rhythm without murmurs, rubs, or gallops.  Abdomen:  Nontender without masses or hepatosplenomegaly.  Bowel sounds are present.  No CVA or flank  tenderness.  Extremities:  No edema.  Skin:  No rash present.     UC Treatments / Results  Labs (all labs ordered are listed, but only abnormal results are displayed) Labs Reviewed  POCT URINALYSIS DIP (MANUAL ENTRY) - Abnormal; Notable for the following:       Result Value   Color, UA orange (*)    Glucose, UA =250 (*)    Bilirubin, UA large (*)    Ketones, POC UA small (15) (*)    Blood, UA moderate (*)    Protein Ur, POC >=300 (*)    Nitrite, UA Positive (*)    Leukocytes, UA large (3+) (*)    All other components within normal limits  URINE CULTURE    EKG  EKG Interpretation None       Radiology No results found.  Procedures Procedures (including critical care time)  Medications Ordered in UC Medications - No data to display   Initial Impression / Assessment and Plan / UC Course  I have reviewed the triage vital signs and the nursing notes.  Pertinent labs & imaging results that were available during my care of the patient were reviewed by me and considered in my medical decision making (see chart for details).  Clinical Course   Urine culture pending. Based on previous urine culture done 08/20/16 showing e. Coli sensitive to Augmentin, will empirically begin Augmentin. Increase fluid intake. If symptoms become significantly worse during the night or over the weekend, proceed to the local emergency room.  Followup with Family Doctor if not improved in one week.      Final Clinical Impressions(s) / UC Diagnoses   Final diagnoses:  Dysuria  Cystitis    New Prescriptions New Prescriptions   AMOXICILLIN-CLAVULANATE (AUGMENTIN) 500-125 MG  TABLET    Take one tab by mouth twice daily     Kandra Nicolas, MD 11/02/16 1610

## 2016-11-02 NOTE — ED Triage Notes (Signed)
Pt c/o dysuria x last night. Denies fever.

## 2016-11-05 ENCOUNTER — Telehealth: Payer: Self-pay | Admitting: *Deleted

## 2016-11-05 LAB — URINE CULTURE

## 2016-11-05 NOTE — Telephone Encounter (Signed)
Callback: No answer, LMOM f/u from visit. UCX sensitive. Complete prescribed course of antibiotics.

## 2016-11-22 ENCOUNTER — Telehealth: Payer: Self-pay | Admitting: Hematology

## 2016-11-22 NOTE — Telephone Encounter (Signed)
S/w pt, gave appt for 09/20/17 @ 9am with Dr. Irene Limbo. Pt verbalized understanding. Mailed pt calendar.

## 2016-11-28 ENCOUNTER — Emergency Department (INDEPENDENT_AMBULATORY_CARE_PROVIDER_SITE_OTHER)
Admission: EM | Admit: 2016-11-28 | Discharge: 2016-11-28 | Disposition: A | Discharge status: Home / Self Care | Payer: Medicare HMO | Source: Home / Self Care | Attending: Family Medicine | Admitting: Family Medicine

## 2016-11-28 ENCOUNTER — Encounter: Payer: Self-pay | Admitting: Emergency Medicine

## 2016-11-28 ENCOUNTER — Emergency Department (INDEPENDENT_AMBULATORY_CARE_PROVIDER_SITE_OTHER): Payer: Medicare HMO

## 2016-11-28 DIAGNOSIS — J439 Emphysema, unspecified: Secondary | ICD-10-CM | POA: Diagnosis not present

## 2016-11-28 DIAGNOSIS — R69 Illness, unspecified: Secondary | ICD-10-CM

## 2016-11-28 DIAGNOSIS — J111 Influenza due to unidentified influenza virus with other respiratory manifestations: Secondary | ICD-10-CM

## 2016-11-28 LAB — POCT CBC W AUTO DIFF (K'VILLE URGENT CARE)

## 2016-11-28 LAB — POCT URINALYSIS DIP (MANUAL ENTRY)
Bilirubin, UA: NEGATIVE
Glucose, UA: NEGATIVE
Ketones, POC UA: NEGATIVE
Leukocytes, UA: NEGATIVE
Nitrite, UA: NEGATIVE
Spec Grav, UA: 1.02 (ref 1.005–1.03)
Urobilinogen, UA: NEGATIVE (ref 0–1)
pH, UA: 5.5 (ref 5–8)

## 2016-11-28 MED ORDER — ONDANSETRON 4 MG PO TBDP
4.0000 mg | ORAL_TABLET | Freq: Once | ORAL | Status: AC
  Administered 2016-11-28: 4 mg via ORAL

## 2016-11-28 MED ORDER — ACETAMINOPHEN 325 MG PO TABS
325.0000 mg | ORAL_TABLET | Freq: Once | ORAL | Status: AC
  Administered 2016-11-28: 325 mg via ORAL

## 2016-11-28 MED ORDER — OSELTAMIVIR PHOSPHATE 75 MG PO CAPS: 75.0000 mg | ORAL_CAPSULE | Freq: Two times a day (BID) | ORAL | 0 refills | Status: DC

## 2016-11-28 MED ORDER — ONDANSETRON HCL 4 MG PO TABS: 4.0000 mg | ORAL_TABLET | Freq: Four times a day (QID) | ORAL | 0 refills | Status: AC

## 2016-11-28 NOTE — ED Provider Notes (Signed)
CSN: [id number]     Arrival date & time 11/28/16  1356 History   First MD Initiated Contact with Patient 11/28/16 1410     Chief Complaint  Patient presents with  . Generalized Body Aches   (Consider location/radiation/quality/duration/timing/severity/associated sxs/prior Treatment) HPI Judy Herrera is a 83 y.o. female presenting to UC with c/o sudden onset body aches, low grade fever, facial pain, fatigue with cough, nausea and vomiting since yesterday.  Denies sick contacts.  She did get the flu vaccine this year.     Past Medical History:  Diagnosis Date  . Allergy   . Arthritis   . Breast CA ([place])    NO BP OR STICKS IN LEFT ARM  . CAD (coronary artery disease)   . Cancer ([place])   . Cardiomyopathy ([place])    improved  . Cataract   . Diverticulosis   . Eccrine porocarcinoma of skin   . History of chemotherapy   . History of radiation therapy 1994   left chest wall  . Hyperlipidemia   . Hypertension   . Osteoporosis   . Ovarian ca ([place])   . Wears dentures    full bottom-partial top  . Wears glasses    Past Surgical History:  Procedure Laterality Date  . ABDOMINAL HYSTERECTOMY  2004  . BREAST SURGERY    . CHOLECYSTECTOMY  1993  . EYE SURGERY     CATARACTS  . left breast mastectomy  1994   T3 N1 Mx  . MASS EXCISION Left 05/09/2014   Procedure: EXCISION OF NODULE LEFT CHEST ;  Surgeon: Pedro Earls, MD;  Location: [facility];  Service: General;  Laterality: Left;  Marland Kitchen MODIFIED RADICAL MASTECTOMY W/ AXILLARY LYMPH NODE DISSECTION W/ PECTORALIS MINOR EXCISION  1994   Left  . right leg  1990   fx  . SHOULDER ARTHROSCOPY    . [place]   replacement-lt  . SKIN BIOPSY  04/27/2011   left hand, forearm and posterior arm   Family History  Problem Relation Age of Onset  . Breast cancer Sister     dx in her 68s  . Heart disease Sister   . Heart disease Father   . Heart disease Mother   . Stroke Mother   . Prostate cancer Maternal Uncle    . Stroke Maternal Grandmother   . Testicular cancer Maternal Grandfather   . Stroke Maternal Grandfather   . Stroke Paternal Grandmother   . Heart disease Paternal Grandfather   . Breast cancer Sister 44    BRCA2+  . Breast cancer Sister 3  . Prostate cancer Maternal Uncle   . Breast cancer Other     maternal great aunt dx later in life   Social History  Substance Use Topics  . Smoking status: Never Smoker  . Smokeless tobacco: Never Used  . Alcohol use No   OB History    No data available     Review of Systems  Constitutional: Positive for chills and fever.  HENT: Positive for congestion, rhinorrhea and sinus pain. Negative for ear pain, sore throat, trouble swallowing and voice change.   Respiratory: Positive for cough. Negative for shortness of breath.   Cardiovascular: Negative for chest pain and palpitations.  Gastrointestinal: Positive for nausea and vomiting. Negative for abdominal pain and diarrhea.  Musculoskeletal: Positive for arthralgias and myalgias. Negative for back pain.  Skin: Negative for rash.    Allergies  Ibuprofen and Morphine  Home Medications  Prior to Admission medications   Medication Sig Start Date End Date Taking? Authorizing Provider  amoxicillin-clavulanate (AUGMENTIN) 500-125 MG tablet Take one tab by mouth twice daily 11/02/16   Kandra Nicolas, MD  aspirin EC 81 MG tablet Take 81 mg by mouth at bedtime.     Historical Provider, MD  calcium carbonate (TUMS - DOSED IN MG ELEMENTAL CALCIUM) 500 MG chewable tablet Chew 2 tablets by mouth daily with supper.    Historical Provider, MD  carvedilol (COREG) 25 MG tablet TAKE 1 TABLET BY MOUTH TWICE DAILY WITH FOOD 09/22/16   Marletta Lor, MD  Cholecalciferol (VITAMIN D) 2000 UNITS tablet Take 2,000 Units by mouth every morning.     Historical Provider, MD  Coenzyme Q10 (CO Q 10) 100 MG CAPS Take 1 capsule by mouth every morning.     Historical Provider, MD  denosumab (PROLIA) 60 MG/ML  SOLN injection Inject 60 mg into the skin every 6 (six) months. Administer in upper arm, thigh, or abdomen    Historical Provider, MD  ferrous fumarate (HEMOCYTE - 106 MG FE) 325 (106 Fe) MG TABS tablet Take 1 tablet by mouth 2-3 times a week as tolerated. 04/20/16   Lennis Marion Downer, MD  fluticasone (FLONASE) 50 MCG/ACT nasal spray SHAKE LIQUID AND USE 2 SPRAYS IN [facility] NOSTRIL DAILY 09/15/16   Marletta Lor, MD  lisinopril-hydrochlorothiazide [facility]) 20-12.5 MG tablet TAKE 1 TABLET BY MOUTH EVERY DAY 09/01/16   Marletta Lor, MD  loperamide (IMODIUM) 2 MG capsule Take 1 capsule (2 mg total) by mouth as needed for diarrhea or loose stools. 01/24/13   Lennis Marion Downer, MD  ondansetron (ZOFRAN) 4 MG tablet Take 1 tablet (4 mg total) by mouth every 6 (six) hours. 11/28/16   Noland Fordyce, PA-C  oseltamivir (TAMIFLU) 75 MG capsule Take 1 capsule (75 mg total) by mouth every 12 (twelve) hours. 11/28/16   Noland Fordyce, PA-C  pantoprazole (PROTONIX) 40 MG tablet TAKE 1 TABLET BY MOUTH EVERY DAY 09/16/16   Marletta Lor, MD  simvastatin (ZOCOR) 40 MG tablet TAKE 1 TABLET BY MOUTH EVERY NIGHT AT BEDTIME 10/20/16   Marletta Lor, MD  traMADol (ULTRAM) 50 MG tablet TAKE 1 TABLET BY MOUTH EVERY 6 HOURS AS NEEDED FOR PAIN 07/22/16   Marletta Lor, MD   Meds Ordered and Administered this Visit   Medications  acetaminophen (TYLENOL) tablet 325 mg (325 mg Oral Given 11/28/16 1429)  ondansetron (ZOFRAN-ODT) disintegrating tablet 4 mg (4 mg Oral Given 11/28/16 1429)    BP 171/71 (BP Location: Right Arm)   Pulse 96   Temp 99 F (37.2 C) (Oral)   Wt 105 lb (47.6 kg)   SpO2 96%   BMI 19.20 kg/m  No data found.   Physical Exam  Constitutional: She is oriented to person, place, and time. She appears well-developed and well-nourished.  Frail elderly pt sitting in exam chair visibly shaking with chills. Alert. Non-toxic appearing. Cooperative during exam.  HENT:  Head:  Normocephalic and atraumatic.  Right Ear: Tympanic membrane normal.  Left Ear: Tympanic membrane normal.  Nose: Nose normal.  Mouth/Throat: Uvula is midline, oropharynx is clear and moist and mucous membranes are normal.  Eyes: EOM are normal.  Neck: Normal range of motion. Neck supple.  Cardiovascular: Normal rate and regular rhythm.   Pulmonary/Chest: Effort normal and breath sounds normal. No respiratory distress. She has no wheezes. She has no rales.  Musculoskeletal: Normal range of motion.  Neurological:  She is alert and oriented to person, place, and time.  Skin: Skin is warm and dry. She is not diaphoretic.  Psychiatric: She has a normal mood and affect. Her behavior is normal.  Nursing note and vitals reviewed.   Urgent Care Course   Clinical Course     Procedures (including critical care time)  Labs Review Labs Reviewed  POCT URINALYSIS DIP (MANUAL ENTRY) - Abnormal; Notable for the following:       Result Value   Blood, UA moderate (*)    Protein Ur, POC trace (*)    All other components within normal limits  COMPLETE METABOLIC PANEL WITH GFR  POCT CBC W AUTO DIFF ([facility])    Imaging Review Dg Chest 2 View  Result Date: 11/28/2016 CLINICAL DATA:  Fever with weakness and chills for 2 days. EXAM: CHEST  2 VIEW COMPARISON:  11/30/2015 FINDINGS: The lungs are markedly hyperexpanded no focal airspace consolidation, pulmonary edema, or pleural effusion. Asymmetric left pleural-parenchymal opacity is stable in the interval. Patient is status post left mastectomy and surgical clips are noted in the left axilla. Status post left shoulder replacement. Stable appearance compression deformity lower thoracic spine. IMPRESSION: Stable exam.  Emphysema without acute cardiopulmonary findings. Electronically Signed   By: Misty Stanley M.D.   On: 11/28/2016 14:47      MDM   1. Influenza-like illness    Pt presenting with sudden onset flu-like symptoms since  yesterday. Temp of 99*F. Pt visibly shaking due to chills. Gagging in triage but no vomiting.  Acetaminophen 343m PO and Zofran 416mODT given.  Per medical records, pt was in UC last month for a UTI.  UA obtained due to severe chills and sudden onset of symptoms.  UA: unremarkable CXR: no evidence of pneumonia. CBC: unremarkable CMP initially ordered, however, pt appears significantly improved after medications given in UC. Will unremarkable UA and CBC, will cancel CMP as it will likely not be of benefit at this time.   Will treat for influenza. Rx: Tamiflu and zofran Encouraged fluids and rest. F/u with PCP later this week. Patient verbalized understanding and agreement with treatment plan.      ErNoland FordycePA-C 11/28/16 1534

## 2016-11-28 NOTE — Discharge Instructions (Signed)
°  You may have 325mg  or 500mg  acetaminophen (Tylenol) every 4-6 hours as needed for fever and chills.  Be sure to stay well hydrated and get plenty of rest. Follow up with your primary care provider later this week if not improving.

## 2016-11-28 NOTE — ED Triage Notes (Signed)
Pt c/o body aches, low grade temp and facial pain that started suddenly yesterday/

## 2016-12-04 ENCOUNTER — Emergency Department
Admission: EM | Admit: 2016-12-04 | Discharge: 2016-12-04 | Disposition: A | Discharge status: Home / Self Care | Payer: Medicare HMO | Source: Home / Self Care | Attending: Family Medicine | Admitting: Family Medicine

## 2016-12-04 ENCOUNTER — Encounter: Payer: Self-pay | Admitting: Emergency Medicine

## 2016-12-04 DIAGNOSIS — N3001 Acute cystitis with hematuria: Secondary | ICD-10-CM

## 2016-12-04 LAB — POCT URINALYSIS DIP (MANUAL ENTRY)
Glucose, UA: 100 — AB
Nitrite, UA: POSITIVE — AB
Protein Ur, POC: 100 — AB
Spec Grav, UA: 1.015 (ref 1.005–1.03)
Urobilinogen, UA: 4 (ref 0–1)
pH, UA: 5 (ref 5–8)

## 2016-12-04 MED ORDER — CEPHALEXIN 500 MG PO CAPS: 500.0000 mg | ORAL_CAPSULE | Freq: Two times a day (BID) | ORAL | 0 refills | Status: DC

## 2016-12-04 NOTE — ED Triage Notes (Signed)
Patient reports bladder pressure for past 2 days; she did start taking pyridium she had on hand.

## 2016-12-04 NOTE — ED Provider Notes (Signed)
CSN: [id number]     Arrival date & time 12/04/16  1107 History   First MD Initiated Contact with Patient 12/04/16 1156     Chief Complaint  Patient presents with  . Dysuria  . Urinary Frequency   (Consider location/radiation/quality/duration/timing/severity/associated sxs/prior Treatment) HPI Judy Herrera is a 83 y.o. female presenting to UC with c/o 2 days of lower back pain, bladder pressure and mild dysuria.  Per triage, pt has taken pyridium, however, during exam, pt stated she has not taken anything OTC for her urinary symptoms. Pt is concerned she may have a UTI. She has had them in the past.  Pt seen about 1 week ago for flu-like symptoms but states she is feeling much improved from those symptoms. Denies fever, chills, n/v/d.    Past Medical History:  Diagnosis Date  . Allergy   . Arthritis   . Breast CA ([place])    NO BP OR STICKS IN LEFT ARM  . CAD (coronary artery disease)   . Cancer ([place])   . Cardiomyopathy ([place])    improved  . Cataract   . Diverticulosis   . Eccrine porocarcinoma of skin   . History of chemotherapy   . History of radiation therapy 1994   left chest wall  . Hyperlipidemia   . Hypertension   . Osteoporosis   . Ovarian ca ([place])   . Wears dentures    full bottom-partial top  . Wears glasses    Past Surgical History:  Procedure Laterality Date  . ABDOMINAL HYSTERECTOMY  2004  . BREAST SURGERY    . CHOLECYSTECTOMY  1993  . EYE SURGERY     CATARACTS  . left breast mastectomy  1994   T3 N1 Mx  . MASS EXCISION Left 05/09/2014   Procedure: EXCISION OF NODULE LEFT CHEST ;  Surgeon: Pedro Earls, MD;  Location: [facility];  Service: General;  Laterality: Left;  Marland Kitchen MODIFIED RADICAL MASTECTOMY W/ AXILLARY LYMPH NODE DISSECTION W/ PECTORALIS MINOR EXCISION  1994   Left  . right leg  1990   fx  . SHOULDER ARTHROSCOPY    . [place]   replacement-lt  . SKIN BIOPSY  04/27/2011   left hand, forearm and posterior arm    Family History  Problem Relation Age of Onset  . Breast cancer Sister     dx in her 73s  . Heart disease Sister   . Heart disease Father   . Heart disease Mother   . Stroke Mother   . Prostate cancer Maternal Uncle   . Stroke Maternal Grandmother   . Testicular cancer Maternal Grandfather   . Stroke Maternal Grandfather   . Stroke Paternal Grandmother   . Heart disease Paternal Grandfather   . Breast cancer Sister 75    BRCA2+  . Breast cancer Sister 53  . Prostate cancer Maternal Uncle   . Breast cancer Other     maternal great aunt dx later in life   Social History  Substance Use Topics  . Smoking status: Never Smoker  . Smokeless tobacco: Never Used  . Alcohol use No   OB History    No data available     Review of Systems  Constitutional: Negative for chills and fever.  HENT: Negative for congestion, ear pain, sore throat, trouble swallowing and voice change.   Respiratory: Negative for cough and shortness of breath.   Cardiovascular: Negative for chest pain and palpitations.  Gastrointestinal: Positive for  abdominal pain ( bladder pressure). Negative for diarrhea, nausea and vomiting.  Genitourinary: Positive for dysuria, frequency, hematuria and urgency. Negative for decreased urine volume.  Musculoskeletal: Negative for arthralgias, back pain and myalgias.  Skin: Negative for rash.    Allergies  Ibuprofen and Morphine  Home Medications   Prior to Admission medications   Medication Sig Start Date End Date Taking? Authorizing Provider  amoxicillin-clavulanate (AUGMENTIN) 500-125 MG tablet Take one tab by mouth twice daily 11/02/16   Kandra Nicolas, MD  aspirin EC 81 MG tablet Take 81 mg by mouth at bedtime.     Historical Provider, MD  calcium carbonate (TUMS - DOSED IN MG ELEMENTAL CALCIUM) 500 MG chewable tablet Chew 2 tablets by mouth daily with supper.    Historical Provider, MD  carvedilol (COREG) 25 MG tablet TAKE 1 TABLET BY MOUTH TWICE DAILY WITH  FOOD 09/22/16   Marletta Lor, MD  cephALEXin (KEFLEX) 500 MG capsule Take 1 capsule (500 mg total) by mouth 2 (two) times daily. For 7 days 12/04/16   Noland Fordyce, PA-C  Cholecalciferol (VITAMIN D) 2000 UNITS tablet Take 2,000 Units by mouth every morning.     Historical Provider, MD  Coenzyme Q10 (CO Q 10) 100 MG CAPS Take 1 capsule by mouth every morning.     Historical Provider, MD  denosumab (PROLIA) 60 MG/ML SOLN injection Inject 60 mg into the skin every 6 (six) months. Administer in upper arm, thigh, or abdomen    Historical Provider, MD  ferrous fumarate (HEMOCYTE - 106 MG FE) 325 (106 Fe) MG TABS tablet Take 1 tablet by mouth 2-3 times a week as tolerated. 04/20/16   Lennis Marion Downer, MD  fluticasone (FLONASE) 50 MCG/ACT nasal spray SHAKE LIQUID AND USE 2 SPRAYS IN [facility] NOSTRIL DAILY 09/15/16   Marletta Lor, MD  lisinopril-hydrochlorothiazide [facility]) 20-12.5 MG tablet TAKE 1 TABLET BY MOUTH EVERY DAY 09/01/16   Marletta Lor, MD  loperamide (IMODIUM) 2 MG capsule Take 1 capsule (2 mg total) by mouth as needed for diarrhea or loose stools. 01/24/13   Lennis Marion Downer, MD  ondansetron (ZOFRAN) 4 MG tablet Take 1 tablet (4 mg total) by mouth every 6 (six) hours. 11/28/16   Noland Fordyce, PA-C  oseltamivir (TAMIFLU) 75 MG capsule Take 1 capsule (75 mg total) by mouth every 12 (twelve) hours. 11/28/16   Noland Fordyce, PA-C  pantoprazole (PROTONIX) 40 MG tablet TAKE 1 TABLET BY MOUTH EVERY DAY 09/16/16   Marletta Lor, MD  simvastatin (ZOCOR) 40 MG tablet TAKE 1 TABLET BY MOUTH EVERY NIGHT AT BEDTIME 10/20/16   Marletta Lor, MD  traMADol (ULTRAM) 50 MG tablet TAKE 1 TABLET BY MOUTH EVERY 6 HOURS AS NEEDED FOR PAIN 07/22/16   Marletta Lor, MD   Meds Ordered and Administered this Visit  Medications - No data to display  BP 113/61 (BP Location: Left Arm)   Pulse 96   Temp 98.1 F (36.7 C) (Oral)   Resp 16   Ht 5' 1"  (1.549 m)   Wt 100 lb (45.4 kg)    SpO2 96%   BMI 18.89 kg/m  No data found.   Physical Exam  Constitutional: She is oriented to person, place, and time. She appears well-developed and well-nourished. No distress.  HENT:  Head: Normocephalic and atraumatic.  Mouth/Throat: Oropharynx is clear and moist.  Eyes: EOM are normal.  Neck: Normal range of motion. Neck supple.  Cardiovascular: Normal rate and regular rhythm.  Pulmonary/Chest: Effort normal and breath sounds normal. No respiratory distress. She has no wheezes.  Abdominal: Soft. She exhibits no distension. There is no tenderness. There is no CVA tenderness.  Musculoskeletal: Normal range of motion.  Neurological: She is alert and oriented to person, place, and time.  Skin: Skin is warm and dry. She is not diaphoretic.  Psychiatric: She has a normal mood and affect. Her behavior is normal.  Nursing note and vitals reviewed.   Urgent Care Course     Procedures (including critical care time)  Labs Review Labs Reviewed  POCT URINALYSIS DIP (MANUAL ENTRY) - Abnormal; Notable for the following:       Result Value   Color, UA orange (*)    Glucose, UA =100 (*)    Bilirubin, UA moderate (*)    Ketones, POC UA small (15) (*)    Blood, UA moderate (*)    Protein Ur, POC =100 (*)    Nitrite, UA Positive (*)    Leukocytes, UA large (3+) (*)    All other components within normal limits  URINE CULTURE    Imaging Review No results found.   MDM   1. Acute cystitis with hematuria    Pt c/o urinary symptoms for 2 days with lower back pain.  UA c/w UTI. Will send culture.  Reviewed prior urine cultures, Keflex should be effective,  Rx: Keflex F/u with PCP in 4-5 days if not improving. Patient verbalized understanding and agreement with treatment plan.     Noland Fordyce, PA-C 12/04/16 1309

## 2016-12-06 ENCOUNTER — Telehealth: Payer: Self-pay | Admitting: Emergency Medicine

## 2016-12-06 LAB — URINE CULTURE

## 2016-12-07 ENCOUNTER — Telehealth: Payer: Self-pay

## 2016-12-07 ENCOUNTER — Ambulatory Visit (INDEPENDENT_AMBULATORY_CARE_PROVIDER_SITE_OTHER): Payer: Medicare HMO | Admitting: Internal Medicine

## 2016-12-07 ENCOUNTER — Encounter: Payer: Self-pay | Admitting: Internal Medicine

## 2016-12-07 VITALS — BP 146/66 | HR 100 | Temp 98.1°F | Ht 61.0 in | Wt 97.8 lb

## 2016-12-07 DIAGNOSIS — I10 Essential (primary) hypertension: Secondary | ICD-10-CM

## 2016-12-07 DIAGNOSIS — I420 Dilated cardiomyopathy: Secondary | ICD-10-CM | POA: Diagnosis not present

## 2016-12-07 DIAGNOSIS — A4151 Sepsis due to Escherichia coli [E. coli]: Secondary | ICD-10-CM | POA: Diagnosis not present

## 2016-12-07 DIAGNOSIS — J42 Unspecified chronic bronchitis: Secondary | ICD-10-CM

## 2016-12-07 LAB — COMPREHENSIVE METABOLIC PANEL
ALT: 30 U/L (ref 0–35)
AST: 39 U/L — ABNORMAL HIGH (ref 0–37)
Albumin: 3.1 g/dL — ABNORMAL LOW (ref 3.5–5.2)
Alkaline Phosphatase: 62 U/L (ref 39–117)
BUN: 13 mg/dL (ref 6–23)
CHLORIDE: 91 meq/L — AB (ref 96–112)
CO2: 34 meq/L — AB (ref 19–32)
Calcium: 9 mg/dL (ref 8.4–10.5)
Creatinine, Ser: 0.77 mg/dL (ref 0.40–1.20)
GFR: 76.05 mL/min (ref >60.00)
Glucose, Bld: 101 mg/dL — ABNORMAL HIGH (ref 70–99)
POTASSIUM: 3.3 meq/L — AB (ref 3.5–5.1)
SODIUM: 134 meq/L — AB (ref 135–145)
TOTAL PROTEIN: 6 g/dL (ref 6.0–8.3)
Total Bilirubin: 1.3 mg/dL — ABNORMAL HIGH (ref 0.2–1.2)

## 2016-12-07 LAB — CBC WITH DIFFERENTIAL/PLATELET
BASOS PCT: 0 % (ref 0.0–3.0)
Basophils Absolute: 0 10*3/uL (ref 0.0–0.1)
EOS PCT: 0.2 % (ref 0.0–5.0)
Eosinophils Absolute: 0 10*3/uL (ref 0.0–0.7)
HCT: 28 % — ABNORMAL LOW (ref 36.0–46.0)
Hemoglobin: 9.5 g/dL — ABNORMAL LOW (ref 12.0–15.0)
LYMPHS ABS: 0.5 10*3/uL — AB (ref 0.7–4.0)
Lymphocytes Relative: 7.4 % — ABNORMAL LOW (ref 12.0–46.0)
MCHC: 34 g/dL (ref 30.0–36.0)
MCV: 91.8 fl (ref 78.0–100.0)
MONO ABS: 0.5 10*3/uL (ref 0.1–1.0)
Monocytes Relative: 7.2 % (ref 3.0–12.0)
NEUTROS ABS: 5.7 10*3/uL (ref 1.4–7.7)
NEUTROS PCT: 85.2 % — AB (ref 43.0–77.0)
Platelets: 259 10*3/uL (ref 150.0–400.0)
RBC: 3.05 Mil/uL — ABNORMAL LOW (ref 3.87–5.11)
RDW: 14 % (ref 11.5–15.5)
WBC: 6.7 10*3/uL (ref 4.0–10.5)

## 2016-12-07 MED ORDER — SULFAMETHOXAZOLE-TRIMETHOPRIM 800-160 MG PO TABS: 1.0000 | ORAL_TABLET | Freq: Two times a day (BID) | ORAL | 0 refills | Status: DC

## 2016-12-07 NOTE — Progress Notes (Signed)
Subjective:    Patient ID: Judy Herrera, female    DOB: 20-Feb-1931, 83 y.o.   MRN: [id number]  HPI 83 year old patienthis medical issues include essential hypertension, COPD and also eccrine porocarcinoma of the skin. She has been ill for approximately 9 days.  She has had anorexia, weakness and generalized myalgias.  She was seen in the urgent care on January 13, and a chest x-ray revealed emphysematous changes only but no active disease.  She was again seen 3 days ago and urine culture revealed Escherichia coli.  The patient was placed on cephalexin.  The sensitivities were not reported.  Patient continues to do poorly with anorexia, a sense of feeling cold.  Last night she had an episode of diaphoresis.  There is been no documented fever.  Denies any cough or pulmonary complaints.  Past Medical History:  Diagnosis Date  . Allergy   . Arthritis   . Breast CA ([place])    NO BP OR STICKS IN LEFT ARM  . CAD (coronary artery disease)   . Cancer ([place])   . Cardiomyopathy ([place])    improved  . Cataract   . Diverticulosis   . Eccrine porocarcinoma of skin   . History of chemotherapy   . History of radiation therapy 1994   left chest wall  . Hyperlipidemia   . Hypertension   . Osteoporosis   . Ovarian ca ([place])   . Wears dentures    full bottom-partial top  . Wears glasses      Social History   Social History  . Marital status: Widowed    Spouse name: N/A  . Number of children: 1  . Years of education: N/A   Occupational History  . Not on file.   Social History Main Topics  . Smoking status: Never Smoker  . Smokeless tobacco: Never Used  . Alcohol use No  . Drug use: No  . Sexual activity: Not on file     Comment: didn't ask   Other Topics Concern  . Not on file   Social History Narrative  . No narrative on file    Past Surgical History:  Procedure Laterality Date  . ABDOMINAL HYSTERECTOMY  2004  . BREAST SURGERY    . CHOLECYSTECTOMY  1993  . EYE SURGERY       CATARACTS  . left breast mastectomy  1994   T3 N1 Mx  . MASS EXCISION Left 05/09/2014   Procedure: EXCISION OF NODULE LEFT CHEST ;  Surgeon: Pedro Earls, MD;  Location: [facility];  Service: General;  Laterality: Left;  Marland Kitchen MODIFIED RADICAL MASTECTOMY W/ AXILLARY LYMPH NODE DISSECTION W/ PECTORALIS MINOR EXCISION  1994   Left  . right leg  1990   fx  . SHOULDER ARTHROSCOPY    . [place]   replacement-lt  . SKIN BIOPSY  04/27/2011   left hand, forearm and posterior arm    Family History  Problem Relation Age of Onset  . Breast cancer Sister     dx in her 24s  . Heart disease Sister   . Heart disease Father   . Heart disease Mother   . Stroke Mother   . Prostate cancer Maternal Uncle   . Stroke Maternal Grandmother   . Testicular cancer Maternal Grandfather   . Stroke Maternal Grandfather   . Stroke Paternal Grandmother   . Heart disease Paternal Grandfather   . Breast cancer Sister 46    BRCA2+  .  Breast cancer Sister 64  . Prostate cancer Maternal Uncle   . Breast cancer Other     maternal great aunt dx later in life    Allergies  Allergen Reactions  . Ibuprofen Nausea Only  . Morphine Nausea And Vomiting    Current Outpatient Prescriptions on File Prior to Visit  Medication Sig Dispense Refill  . aspirin EC 81 MG tablet Take 81 mg by mouth at bedtime.     . calcium carbonate (TUMS - DOSED IN MG ELEMENTAL CALCIUM) 500 MG chewable tablet Chew 2 tablets by mouth daily with supper.    . carvedilol (COREG) 25 MG tablet TAKE 1 TABLET BY MOUTH TWICE DAILY WITH FOOD 180 tablet 1  . cephALEXin (KEFLEX) 500 MG capsule Take 1 capsule (500 mg total) by mouth 2 (two) times daily. For 7 days 14 capsule 0  . Cholecalciferol (VITAMIN D) 2000 UNITS tablet Take 2,000 Units by mouth every morning.     . Coenzyme Q10 (CO Q 10) 100 MG CAPS Take 1 capsule by mouth every morning.     . denosumab (PROLIA) 60 MG/ML SOLN injection Inject 60 mg into the skin  every 6 (six) months. Administer in upper arm, thigh, or abdomen    . ferrous fumarate (HEMOCYTE - 106 MG FE) 325 (106 Fe) MG TABS tablet Take 1 tablet by mouth 2-3 times a week as tolerated. 30 each 1  . fluticasone (FLONASE) 50 MCG/ACT nasal spray SHAKE LIQUID AND USE 2 SPRAYS IN EACH NOSTRIL DAILY 48 g 3  . lisinopril-hydrochlorothiazide (PRINZIDE,ZESTORETIC) 20-12.5 MG tablet TAKE 1 TABLET BY MOUTH EVERY DAY 90 tablet 1  . loperamide (IMODIUM) 2 MG capsule Take 1 capsule (2 mg total) by mouth as needed for diarrhea or loose stools. 20 capsule 0  . ondansetron (ZOFRAN) 4 MG tablet Take 1 tablet (4 mg total) by mouth every 6 (six) hours. 12 tablet 0  . oseltamivir (TAMIFLU) 75 MG capsule Take 1 capsule (75 mg total) by mouth every 12 (twelve) hours. 10 capsule 0  . pantoprazole (PROTONIX) 40 MG tablet TAKE 1 TABLET BY MOUTH EVERY DAY 90 tablet 1  . simvastatin (ZOCOR) 40 MG tablet TAKE 1 TABLET BY MOUTH EVERY NIGHT AT BEDTIME 90 tablet 1   No current facility-administered medications on file prior to visit.     BP (!) 146/66 (BP Location: Right Arm, Patient Position: Sitting, Cuff Size: Normal)   Pulse 100   Temp 98.1 F (36.7 C) (Oral)   Ht _0  (1.549 m)   Wt 97 lb 12.8 oz (44.4 kg)   SpO2 93%   BMI 18.48 kg/m       Review of Systems  Constitutional: Positive for activity change, appetite change, diaphoresis and fatigue.  HENT: Negative for congestion, dental problem, hearing loss, rhinorrhea, sinus pressure, sore throat and tinnitus.   Eyes: Negative for pain, discharge and visual disturbance.  Respiratory: Negative for cough and shortness of breath.   Cardiovascular: Negative for chest pain, palpitations and leg swelling.  Gastrointestinal: Negative for abdominal distention, abdominal pain, blood in stool, constipation, diarrhea, nausea and vomiting.  Genitourinary: Negative for difficulty urinating, dysuria, flank pain, frequency, hematuria, pelvic pain, urgency, vaginal  bleeding, vaginal discharge and vaginal pain.  Musculoskeletal: Positive for arthralgias and myalgias. Negative for gait problem and joint swelling.  Skin: Negative for rash.  Neurological: Positive for weakness. Negative for dizziness, syncope, speech difficulty, numbness and headaches.  Hematological: Negative for adenopathy.  Psychiatric/Behavioral: Negative for agitation, behavioral problems and  dysphoric mood. The patient is not nervous/anxious.        Objective:   Physical Exam  Constitutional: She is oriented to person, place, and time. She appears well-developed and well-nourished.  Alert Weak, but no acute distress Afebrile Pulse 93 O2 saturation 94% Temperature 98.1 Blood pressure 140/70  HENT:  Head: Normocephalic.  Right Ear: External ear normal.  Left Ear: External ear normal.  Mouth/Throat: Oropharynx is clear and moist.  Eyes: Conjunctivae and EOM are normal. Pupils are equal, round, and reactive to light.  Neck: Normal range of motion. Neck supple. No thyromegaly present.  Cardiovascular: Normal rate, regular rhythm, normal heart sounds and intact distal pulses.   Pulmonary/Chest: Effort normal and breath sounds normal. No respiratory distress. She has no wheezes. She has no rales.  Slight diminished breath sounds but clear  Abdominal: Soft. Bowel sounds are normal. She exhibits no mass. There is no tenderness. There is no rebound and no guarding.  Musculoskeletal: Normal range of motion.  Lymphadenopathy:    She has no cervical adenopathy.  Neurological: She is alert and oriented to person, place, and time.  Skin: Skin is warm and dry. No rash noted.  Psychiatric: She has a normal mood and affect. Her behavior is normal.          Assessment & Plan:   Escherichia coli UTI with probable early sepsis syndrome.  Will check lab including blood cultures.  Will treat with Rocephin 500 mg IM and started on Septra DS twice daily for 7 days.  Patient will report to  the ED for consideration of possible mission if any clinical worsening.  Will force fluids Essential hypertension, stable COPD stable  Patient will report any new or worsening symptoms  Nyoka Cowden

## 2016-12-07 NOTE — Patient Instructions (Signed)
Report to the emergency department for consideration of possible mission if you develop any new or worsening symptoms  Take your antibiotic as prescribed until ALL of it is gone, but stop if you develop a rash, swelling, or any side effects of the medication.  Contact our office as soon as possible if  there are side effects of the medication.  Return in one week for follow-up  or sooner if needed

## 2016-12-07 NOTE — Telephone Encounter (Signed)
Pt seen by family Medicine, antibiotic changes to Septra DS

## 2016-12-07 NOTE — Progress Notes (Signed)
Pre visit review using our clinic review tool, if applicable. No additional management support is needed unless otherwise documented below in the visit note. 

## 2016-12-13 LAB — CULTURE, BLOOD (SINGLE): ORGANISM ID, BACTERIA: NO GROWTH

## 2016-12-14 ENCOUNTER — Ambulatory Visit: Payer: Medicare HMO | Admitting: Internal Medicine

## 2016-12-30 DIAGNOSIS — S22000D Wedge compression fracture of unspecified thoracic vertebra, subsequent encounter for fracture with routine healing: Secondary | ICD-10-CM | POA: Diagnosis not present

## 2016-12-30 DIAGNOSIS — M810 Age-related osteoporosis without current pathological fracture: Secondary | ICD-10-CM | POA: Diagnosis not present

## 2017-01-06 DIAGNOSIS — Z8543 Personal history of malignant neoplasm of ovary: Secondary | ICD-10-CM | POA: Diagnosis not present

## 2017-01-06 DIAGNOSIS — D696 Thrombocytopenia, unspecified: Secondary | ICD-10-CM | POA: Diagnosis not present

## 2017-01-06 DIAGNOSIS — H9113 Presbycusis, bilateral: Secondary | ICD-10-CM | POA: Diagnosis not present

## 2017-01-06 DIAGNOSIS — Z0000 Encounter for general adult medical examination without abnormal findings: Secondary | ICD-10-CM | POA: Diagnosis not present

## 2017-01-06 DIAGNOSIS — G3184 Mild cognitive impairment, so stated: Secondary | ICD-10-CM | POA: Diagnosis not present

## 2017-01-06 DIAGNOSIS — M545 Low back pain: Secondary | ICD-10-CM | POA: Diagnosis not present

## 2017-01-06 DIAGNOSIS — I10 Essential (primary) hypertension: Secondary | ICD-10-CM | POA: Diagnosis not present

## 2017-01-06 DIAGNOSIS — E784 Other hyperlipidemia: Secondary | ICD-10-CM | POA: Diagnosis not present

## 2017-01-06 DIAGNOSIS — Z974 Presence of external hearing-aid: Secondary | ICD-10-CM | POA: Diagnosis not present

## 2017-01-06 DIAGNOSIS — M79604 Pain in right leg: Secondary | ICD-10-CM | POA: Diagnosis not present

## 2017-01-06 DIAGNOSIS — M13159 Monoarthritis, not elsewhere classified, unspecified hip: Secondary | ICD-10-CM | POA: Diagnosis not present

## 2017-01-06 DIAGNOSIS — Z79891 Long term (current) use of opiate analgesic: Secondary | ICD-10-CM | POA: Diagnosis not present

## 2017-01-06 DIAGNOSIS — Z853 Personal history of malignant neoplasm of breast: Secondary | ICD-10-CM | POA: Diagnosis not present

## 2017-01-06 DIAGNOSIS — M810 Age-related osteoporosis without current pathological fracture: Secondary | ICD-10-CM | POA: Diagnosis not present

## 2017-01-13 ENCOUNTER — Ambulatory Visit (INDEPENDENT_AMBULATORY_CARE_PROVIDER_SITE_OTHER): Payer: Medicare HMO | Admitting: Internal Medicine

## 2017-01-13 ENCOUNTER — Encounter: Payer: Self-pay | Admitting: Internal Medicine

## 2017-01-13 VITALS — BP 122/58 | HR 150 | Temp 97.8°F | Ht 61.0 in | Wt 95.8 lb

## 2017-01-13 DIAGNOSIS — I2510 Atherosclerotic heart disease of native coronary artery without angina pectoris: Secondary | ICD-10-CM

## 2017-01-13 DIAGNOSIS — I10 Essential (primary) hypertension: Secondary | ICD-10-CM

## 2017-01-13 DIAGNOSIS — I420 Dilated cardiomyopathy: Secondary | ICD-10-CM

## 2017-01-13 NOTE — Patient Instructions (Signed)
Limit your sodium (Salt) intake  Please check your blood pressure on a regular basis.  If it is consistently greater than 150/90, please make an office appointment.    It is important that you exercise regularly, at least 20 minutes 3 to 4 times per week.  If you develop chest pain or shortness of breath seek  medical attention.  Take a calcium supplement, plus 800-1200 units of vitamin D 

## 2017-01-13 NOTE — Progress Notes (Signed)
Pre visit review using our clinic review tool, if applicable. No additional management support is needed unless otherwise documented below in the visit note. 

## 2017-01-13 NOTE — Progress Notes (Signed)
Subjective:    Patient ID: Judy Herrera, female    DOB: 1930/12/17, 83 y.o.   MRN: [id number]  HPI  83 year old patient seen today for her six month follow-up.  She was seen last month with a urinary tract infection.  Today she feels well She has a history of essential hypertension. She has coronary artery disease which has been stable.  Denies any exertional chest pain or shortness of breath  She is scheduled for oncology follow-up later this spring No further urinary tract symptoms   Past Medical History:  Diagnosis Date  . Allergy   . Arthritis   . Breast CA ([place])    NO BP OR STICKS IN LEFT ARM  . CAD (coronary artery disease)   . Cancer ([place])   . Cardiomyopathy ([place])    improved  . Cataract   . Diverticulosis   . Eccrine porocarcinoma of skin   . History of chemotherapy   . History of radiation therapy 1994   left chest wall  . Hyperlipidemia   . Hypertension   . Osteoporosis   . Ovarian ca ([place])   . Wears dentures    full bottom-partial top  . Wears glasses      Social History   Social History  . Marital status: Widowed    Spouse name: N/A  . Number of children: 1  . Years of education: N/A   Occupational History  . Not on file.   Social History Main Topics  . Smoking status: Never Smoker  . Smokeless tobacco: Never Used  . Alcohol use No  . Drug use: No  . Sexual activity: Not on file     Comment: didn't ask   Other Topics Concern  . Not on file   Social History Narrative  . No narrative on file    Past Surgical History:  Procedure Laterality Date  . ABDOMINAL HYSTERECTOMY  2004  . BREAST SURGERY    . CHOLECYSTECTOMY  1993  . EYE SURGERY     CATARACTS  . left breast mastectomy  1994   T3 N1 Mx  . MASS EXCISION Left 05/09/2014   Procedure: EXCISION OF NODULE LEFT CHEST ;  Surgeon: Pedro Earls, MD;  Location: [facility];  Service: General;  Laterality: Left;  Marland Kitchen MODIFIED RADICAL MASTECTOMY W/ AXILLARY LYMPH NODE  DISSECTION W/ PECTORALIS MINOR EXCISION  1994   Left  . right leg  1990   fx  . SHOULDER ARTHROSCOPY    . [place]   replacement-lt  . SKIN BIOPSY  04/27/2011   left hand, forearm and posterior arm    Family History  Problem Relation Age of Onset  . Breast cancer Sister     dx in her 60s  . Heart disease Sister   . Heart disease Father   . Heart disease Mother   . Stroke Mother   . Prostate cancer Maternal Uncle   . Stroke Maternal Grandmother   . Testicular cancer Maternal Grandfather   . Stroke Maternal Grandfather   . Stroke Paternal Grandmother   . Heart disease Paternal Grandfather   . Breast cancer Sister 57    BRCA2+  . Breast cancer Sister 27  . Prostate cancer Maternal Uncle   . Breast cancer Other     maternal great aunt dx later in life    Allergies  Allergen Reactions  . Ibuprofen Nausea Only  . Morphine Nausea And Vomiting    Current  Outpatient Prescriptions on File Prior to Visit  Medication Sig Dispense Refill  . aspirin EC 81 MG tablet Take 81 mg by mouth at bedtime.     . calcium carbonate (TUMS - DOSED IN MG ELEMENTAL CALCIUM) 500 MG chewable tablet Chew 2 tablets by mouth daily with supper.    . carvedilol (COREG) 25 MG tablet TAKE 1 TABLET BY MOUTH TWICE DAILY WITH FOOD 180 tablet 1  . Cholecalciferol (VITAMIN D) 2000 UNITS tablet Take 2,000 Units by mouth every morning.     . Coenzyme Q10 (CO Q 10) 100 MG CAPS Take 1 capsule by mouth every morning.     . denosumab (PROLIA) 60 MG/ML SOLN injection Inject 60 mg into the skin every 6 (six) months. Administer in upper arm, thigh, or abdomen    . ferrous fumarate (HEMOCYTE - 106 MG FE) 325 (106 Fe) MG TABS tablet Take 1 tablet by mouth 2-3 times a week as tolerated. 30 each 1  . fluticasone (FLONASE) 50 MCG/ACT nasal spray SHAKE LIQUID AND USE 2 SPRAYS IN EACH NOSTRIL DAILY 48 g 3  . lisinopril-hydrochlorothiazide (PRINZIDE,ZESTORETIC) 20-12.5 MG tablet TAKE 1 TABLET BY MOUTH EVERY DAY 90  tablet 1  . loperamide (IMODIUM) 2 MG capsule Take 1 capsule (2 mg total) by mouth as needed for diarrhea or loose stools. 20 capsule 0  . ondansetron (ZOFRAN) 4 MG tablet Take 1 tablet (4 mg total) by mouth every 6 (six) hours. 12 tablet 0  . oseltamivir (TAMIFLU) 75 MG capsule Take 1 capsule (75 mg total) by mouth every 12 (twelve) hours. 10 capsule 0  . pantoprazole (PROTONIX) 40 MG tablet TAKE 1 TABLET BY MOUTH EVERY DAY 90 tablet 1  . simvastatin (ZOCOR) 40 MG tablet TAKE 1 TABLET BY MOUTH EVERY NIGHT AT BEDTIME 90 tablet 1  . sulfamethoxazole-trimethoprim (BACTRIM DS) 800-160 MG tablet Take 1 tablet by mouth 2 (two) times daily. 14 tablet 0   No current facility-administered medications on file prior to visit.     BP (!) 122/58 (BP Location: Right Arm, Patient Position: Sitting, Cuff Size: Normal)   Pulse (!) 150   Temp 97.8 F (36.6 C) (Oral)   Ht 5' 1"  (1.549 m)   Wt 95 lb 12.8 oz (43.5 kg)   SpO2 91%   BMI 18.10 kg/m     Review of Systems  HENT: Positive for voice change. Negative for congestion, dental problem, hearing loss, rhinorrhea, sinus pressure, sore throat and tinnitus.   Eyes: Negative for pain, discharge and visual disturbance.  Respiratory: Negative for cough and shortness of breath.   Cardiovascular: Negative for chest pain, palpitations and leg swelling.  Gastrointestinal: Negative for abdominal distention, abdominal pain, blood in stool, constipation, diarrhea, nausea and vomiting.  Genitourinary: Negative for difficulty urinating, dysuria, flank pain, frequency, hematuria, pelvic pain, urgency, vaginal bleeding, vaginal discharge and vaginal pain.  Musculoskeletal: Negative for arthralgias, gait problem and joint swelling.  Skin: Negative for rash.  Neurological: Positive for weakness. Negative for dizziness, syncope, speech difficulty, numbness and headaches.  Hematological: Negative for adenopathy.  Psychiatric/Behavioral: Negative for agitation,  behavioral problems and dysphoric mood. The patient is not nervous/anxious.        Objective:   Physical Exam  Constitutional: She is oriented to person, place, and time. She appears well-developed and well-nourished.  Elderly, frail hoarse Blood pressure low normal  HENT:  Head: Normocephalic.  Right Ear: External ear normal.  Left Ear: External ear normal.  Mouth/Throat: Oropharynx is clear and moist.  Eyes: Conjunctivae and EOM are normal. Pupils are equal, round, and reactive to light.  Neck: Normal range of motion. Neck supple. No thyromegaly present.  Cardiovascular: Normal rate, regular rhythm, normal heart sounds and intact distal pulses.   Pulmonary/Chest: Effort normal and breath sounds normal.  Abdominal: Soft. Bowel sounds are normal. She exhibits no mass. There is no tenderness.  Musculoskeletal: Normal range of motion.  Chronic left arm edema  Lymphadenopathy:    She has no cervical adenopathy.  Neurological: She is alert and oriented to person, place, and time.  Skin: Skin is warm and dry. No rash noted.  Psychiatric: She has a normal mood and affect. Her behavior is normal.          Assessment & Plan:   Essential hypertension, well-controlled Coronary artery disease, stable History of  UTI resolved  Osteoporosis.   Continue prolia  Six-month follow-up No change in medication Oncology follow-up as scheduled  Nyoka Cowden

## 2017-01-21 ENCOUNTER — Telehealth: Payer: Self-pay | Admitting: Internal Medicine

## 2017-01-21 MED ORDER — TRAMADOL HCL 50 MG PO TABS: 50.0000 mg | ORAL_TABLET | Freq: Three times a day (TID) | ORAL | 0 refills | Status: DC | PRN

## 2017-01-21 NOTE — Telephone Encounter (Signed)
Okay, #50

## 2017-01-21 NOTE — Telephone Encounter (Signed)
Pt request refill  Tramadol 50 mg  Pt states her leg have been hurting.  Pt states she was just seen 01/13/17.  [id number] - [place], [place] - [place]

## 2017-01-21 NOTE — Telephone Encounter (Signed)
I do not see Tramadol on pt's med list. Please advise

## 2017-01-22 NOTE — Telephone Encounter (Signed)
Called pt and made aware of new rx

## 2017-01-25 ENCOUNTER — Ambulatory Visit (INDEPENDENT_AMBULATORY_CARE_PROVIDER_SITE_OTHER): Payer: Medicare HMO | Admitting: Family Medicine

## 2017-01-25 ENCOUNTER — Encounter: Payer: Self-pay | Admitting: Family Medicine

## 2017-01-25 VITALS — BP 124/58 | HR 74 | Temp 97.7°F | Ht 61.0 in | Wt 95.6 lb

## 2017-01-25 DIAGNOSIS — R300 Dysuria: Secondary | ICD-10-CM

## 2017-01-25 LAB — POCT URINALYSIS DIPSTICK
BILIRUBIN UA: NEGATIVE
GLUCOSE UA: NEGATIVE
KETONES UA: NEGATIVE
NITRITE UA: NEGATIVE
PH UA: 7.5
Protein, UA: NEGATIVE
SPEC GRAV UA: 1.02
Urobilinogen, UA: 0.2

## 2017-01-25 MED ORDER — NITROFURANTOIN MONOHYD MACRO 100 MG PO CAPS: 100.0000 mg | ORAL_CAPSULE | Freq: Two times a day (BID) | ORAL | 0 refills | Status: DC

## 2017-01-25 NOTE — Patient Instructions (Signed)
I sent the antibiotic (Macrobid) to the pharmacy.  I hope you are feeling better soon! Seek care immediately if worsening, new concerns or you are not improving with treatment.

## 2017-01-25 NOTE — Progress Notes (Signed)
Pre visit review using our clinic review tool, if applicable. No additional management support is needed unless otherwise documented below in the visit note. 

## 2017-01-25 NOTE — Progress Notes (Signed)
HPI:  Acute visit for Dysuria: -started yesterday -discomfort with urination -reports hx UTI and reports this feels the same -denies: fever, malaise, hematuria, flank or abd/pelvic pain, NVD, vaginal symptoms   ROS: See pertinent positives and negatives per HPI.  Past Medical History:  Diagnosis Date  . Allergy   . Arthritis   . Breast CA ([place])    NO BP OR STICKS IN LEFT ARM  . CAD (coronary artery disease)   . Cancer ([place])   . Cardiomyopathy ([place])    improved  . Cataract   . Diverticulosis   . Eccrine porocarcinoma of skin   . History of chemotherapy   . History of radiation therapy 1994   left chest wall  . Hyperlipidemia   . Hypertension   . Osteoporosis   . Ovarian ca ([place])   . Wears dentures    full bottom-partial top  . Wears glasses     Past Surgical History:  Procedure Laterality Date  . ABDOMINAL HYSTERECTOMY  2004  . BREAST SURGERY    . CHOLECYSTECTOMY  1993  . EYE SURGERY     CATARACTS  . left breast mastectomy  1994   T3 N1 Mx  . MASS EXCISION Left 05/09/2014   Procedure: EXCISION OF NODULE LEFT CHEST ;  Surgeon: Pedro Earls, MD;  Location: [facility];  Service: General;  Laterality: Left;  Marland Kitchen MODIFIED RADICAL MASTECTOMY W/ AXILLARY LYMPH NODE DISSECTION W/ PECTORALIS MINOR EXCISION  1994   Left  . right leg  1990   fx  . SHOULDER ARTHROSCOPY    . [place]   replacement-lt  . SKIN BIOPSY  04/27/2011   left hand, forearm and posterior arm    Family History  Problem Relation Age of Onset  . Breast cancer Sister     dx in her 81s  . Heart disease Sister   . Heart disease Father   . Heart disease Mother   . Stroke Mother   . Prostate cancer Maternal Uncle   . Stroke Maternal Grandmother   . Testicular cancer Maternal Grandfather   . Stroke Maternal Grandfather   . Stroke Paternal Grandmother   . Heart disease Paternal Grandfather   . Breast cancer Sister 100    BRCA2+  . Breast cancer Sister 38  .  Prostate cancer Maternal Uncle   . Breast cancer Other     maternal great aunt dx later in life    Social History   Social History  . Marital status: Widowed    Spouse name: N/A  . Number of children: 1  . Years of education: N/A   Social History Main Topics  . Smoking status: Never Smoker  . Smokeless tobacco: Never Used  . Alcohol use No  . Drug use: No  . Sexual activity: Not Asked     Comment: didn't ask   Other Topics Concern  . None   Social History Narrative  . None     Current Outpatient Prescriptions:  .  aspirin EC 81 MG tablet, Take 81 mg by mouth at bedtime. , Disp: , Rfl:  .  calcium carbonate (TUMS - DOSED IN MG ELEMENTAL CALCIUM) 500 MG chewable tablet, Chew 2 tablets by mouth daily with supper., Disp: , Rfl:  .  carvedilol (COREG) 25 MG tablet, TAKE 1 TABLET BY MOUTH TWICE DAILY WITH FOOD, Disp: 180 tablet, Rfl: 1 .  Cholecalciferol (VITAMIN D) 2000 UNITS tablet, Take 2,000 Units by mouth every morning. ,  Disp: , Rfl:  .  Coenzyme Q10 (CO Q 10) 100 MG CAPS, Take 1 capsule by mouth every morning. , Disp: , Rfl:  .  denosumab (PROLIA) 60 MG/ML SOLN injection, Inject 60 mg into the skin every 6 (six) months. Administer in upper arm, thigh, or abdomen, Disp: , Rfl:  .  ferrous fumarate (HEMOCYTE - 106 MG FE) 325 (106 Fe) MG TABS tablet, Take 1 tablet by mouth 2-3 times a week as tolerated., Disp: 30 each, Rfl: 1 .  fluticasone (FLONASE) 50 MCG/ACT nasal spray, SHAKE LIQUID AND USE 2 SPRAYS IN EACH NOSTRIL DAILY, Disp: 48 g, Rfl: 3 .  lisinopril-hydrochlorothiazide (PRINZIDE,ZESTORETIC) 20-12.5 MG tablet, TAKE 1 TABLET BY MOUTH EVERY DAY, Disp: 90 tablet, Rfl: 1 .  loperamide (IMODIUM) 2 MG capsule, Take 1 capsule (2 mg total) by mouth as needed for diarrhea or loose stools., Disp: 20 capsule, Rfl: 0 .  ondansetron (ZOFRAN) 4 MG tablet, Take 1 tablet (4 mg total) by mouth every 6 (six) hours., Disp: 12 tablet, Rfl: 0 .  oseltamivir (TAMIFLU) 75 MG capsule, Take 1  capsule (75 mg total) by mouth every 12 (twelve) hours., Disp: 10 capsule, Rfl: 0 .  pantoprazole (PROTONIX) 40 MG tablet, TAKE 1 TABLET BY MOUTH EVERY DAY, Disp: 90 tablet, Rfl: 1 .  simvastatin (ZOCOR) 40 MG tablet, TAKE 1 TABLET BY MOUTH EVERY NIGHT AT BEDTIME, Disp: 90 tablet, Rfl: 1 .  sulfamethoxazole-trimethoprim (BACTRIM DS) 800-160 MG tablet, Take 1 tablet by mouth 2 (two) times daily., Disp: 14 tablet, Rfl: 0 .  traMADol (ULTRAM) 50 MG tablet, Take 1 tablet (50 mg total) by mouth every 8 (eight) hours as needed., Disp: 30 tablet, Rfl: 0 .  nitrofurantoin, macrocrystal-monohydrate, (MACROBID) 100 MG capsule, Take 1 capsule (100 mg total) by mouth 2 (two) times daily., Disp: 14 capsule, Rfl: 0  EXAM:  Vitals:   01/25/17 1028  BP: (!) 124/58  Pulse: 74  Temp: 97.7 F (36.5 C)    Body mass index is 18.06 kg/m.  GENERAL: vitals reviewed and listed above, alert, oriented, appears well hydrated and in no acute distress  HEENT: atraumatic, conjunttiva clear, no obvious abnormalities on inspection of external nose and ears  NECK: no obvious masses on inspection  LUNGS: clear to auscultation bilaterally, no wheezes, rales or rhonchi, good air movement  CV: HRRR, no peripheral edema  ABD: BS+, soft, NTTP, no CVA TTP  MS: moves all extremities without noticeable abnormality  PSYCH: pleasant and cooperative, no obvious depression or anxiety  ASSESSMENT AND PLAN:  Discussed the following assessment and plan:  Dysuria - Plan: POC Urinalysis Dipstick  -udip with leuks and blood -possible UTI, culture pending -discussed risks/benefits abx and pt prefers to start - rx sent -Patient advised to return or notify a doctor immediately if symptoms worsen or persist or new concerns arise.  Patient Instructions  I sent the antibiotic (Macrobid) to the pharmacy.  I hope you are feeling better soon! Seek care immediately if worsening, new concerns or you are not improving with  treatment.     Colin Benton R., DO

## 2017-02-06 ENCOUNTER — Encounter: Payer: Self-pay | Admitting: Emergency Medicine

## 2017-02-06 ENCOUNTER — Emergency Department
Admission: EM | Admit: 2017-02-06 | Discharge: 2017-02-06 | Disposition: A | Discharge status: Home / Self Care | Payer: Medicare HMO | Source: Home / Self Care | Attending: Family Medicine | Admitting: Family Medicine

## 2017-02-06 DIAGNOSIS — N3090 Cystitis, unspecified without hematuria: Secondary | ICD-10-CM | POA: Diagnosis not present

## 2017-02-06 DIAGNOSIS — B9789 Other viral agents as the cause of diseases classified elsewhere: Secondary | ICD-10-CM | POA: Diagnosis not present

## 2017-02-06 DIAGNOSIS — J069 Acute upper respiratory infection, unspecified: Secondary | ICD-10-CM | POA: Diagnosis not present

## 2017-02-06 LAB — POCT URINALYSIS DIP (MANUAL ENTRY)
Bilirubin, UA: NEGATIVE
Glucose, UA: NEGATIVE
Nitrite, UA: POSITIVE — AB
PROTEIN UA: NEGATIVE
SPEC GRAV UA: 1.015 (ref 1.030–1.035)
UROBILINOGEN UA: NEGATIVE (ref ?–2.0)
pH, UA: 6 (ref 5.0–8.0)

## 2017-02-06 MED ORDER — CEPHALEXIN 500 MG PO CAPS: 500.0000 mg | ORAL_CAPSULE | Freq: Two times a day (BID) | ORAL | 0 refills | Status: DC

## 2017-02-06 NOTE — ED Triage Notes (Signed)
Pt c/o dysuria this am. No other sxs.

## 2017-02-06 NOTE — ED Provider Notes (Signed)
[facility]    CSN: [id number] Arrival date & time: 02/06/17  1312     History   Chief Complaint Chief Complaint  Patient presents with  . Dysuria    HPI Judy Herrera is a 83 y.o. female.   Patient developed dysuria this morning without other symptoms.   The history is provided by the patient.  Dysuria  Pain quality:  Burning Pain severity:  Mild Onset quality:  Sudden Duration:  5 hours Timing:  Constant Progression:  Worsening Chronicity:  Recurrent Recent urinary tract infections: no   Relieved by:  None tried Worsened by:  Nothing Ineffective treatments:  None tried Urinary symptoms: frequent urination and hesitancy   Urinary symptoms: no discolored urine, no foul-smelling urine, no hematuria and no bladder incontinence   Associated symptoms: no abdominal pain, no fever, no flank pain, no genital lesions, no nausea and no vomiting     Past Medical History:  Diagnosis Date  . Allergy   . Arthritis   . Breast CA ([place])    NO BP OR STICKS IN LEFT ARM  . CAD (coronary artery disease)   . Cancer ([place])   . Cardiomyopathy ([place])    improved  . Cataract   . Diverticulosis   . Eccrine porocarcinoma of skin   . History of chemotherapy   . History of radiation therapy 1994   left chest wall  . Hyperlipidemia   . Hypertension   . Osteoporosis   . Ovarian ca ([place])   . Wears dentures    full bottom-partial top  . Wears glasses     Patient Active Problem List   Diagnosis Date Noted  . Breast cancer screening, high risk patient 04/21/2016  . Ascending aortic aneurysm ([place]) 10/04/2015  . Lymphedema of upper extremity following lymphadenectomy 04/19/2015  . Thrombocytopenia ([place]) 04/19/2015  . Absolute anemia 04/19/2015  . Ovarian cancer ([place]) 04/19/2015  . Unilateral vocal cord paralysis 04/19/2015  . Portacath in place 04/19/2015  . 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 LBBB (left bundle branch block) 01/12/2014  . Eccrine  porocarcinoma of skin 04/01/2012  . TENDINITIS, RIGHT WRIST 11/12/2010  . SYNCOPE 09/23/2010  . BENIGN NEOPLASM SKIN OTHER&UNSPEC PARTS FACE 08/06/2010  . BACK PAIN 07/03/2010  . Hyperlipidemia 01/15/2010  . Congestive dilated cardiomyopathy ([place]) 01/15/2010  . Allergic rhinitis 08/22/2009  . OSTEOPOROSIS 02/16/2008  . COPD (chronic obstructive pulmonary disease) ([place]) 02/04/2008  . Essential hypertension 07/13/2007  . Coronary atherosclerosis 07/13/2007  . BREAST CANCER, HX OF 07/13/2007  . COLONIC POLYPS, HX OF 07/13/2007    Past Surgical History:  Procedure Laterality Date  . ABDOMINAL HYSTERECTOMY  2004  . BREAST SURGERY    . CHOLECYSTECTOMY  1993  . EYE SURGERY     CATARACTS  . left breast mastectomy  1994   T3 N1 Mx  . MASS EXCISION Left 05/09/2014   Procedure: EXCISION OF NODULE LEFT CHEST ;  Surgeon: Pedro Earls, MD;  Location: [facility];  Service: General;  Laterality: Left;  Marland Kitchen MODIFIED RADICAL MASTECTOMY W/ AXILLARY LYMPH NODE DISSECTION W/ PECTORALIS MINOR EXCISION  1994   Left  . right leg  1990   fx  . SHOULDER ARTHROSCOPY    . [place]   replacement-lt  . SKIN BIOPSY  04/27/2011   left hand, forearm and posterior arm    OB History    No data available       Home Medications  Prior to Admission medications   Medication Sig Start Date End Date Taking? Authorizing Provider  aspirin EC 81 MG tablet Take 81 mg by mouth at bedtime.     Historical Provider, MD  calcium carbonate (TUMS - DOSED IN MG ELEMENTAL CALCIUM) 500 MG chewable tablet Chew 2 tablets by mouth daily with supper.    Historical Provider, MD  carvedilol (COREG) 25 MG tablet TAKE 1 TABLET BY MOUTH TWICE DAILY WITH FOOD 09/22/16   Marletta Lor, MD  cephALEXin (KEFLEX) 500 MG capsule Take 1 capsule (500 mg total) by mouth 2 (two) times daily. 02/06/17   Kandra Nicolas, MD  Cholecalciferol (VITAMIN D) 2000 UNITS tablet Take 2,000 Units by mouth every  morning.     Historical Provider, MD  Coenzyme Q10 (CO Q 10) 100 MG CAPS Take 1 capsule by mouth every morning.     Historical Provider, MD  denosumab (PROLIA) 60 MG/ML SOLN injection Inject 60 mg into the skin every 6 (six) months. Administer in upper arm, thigh, or abdomen    Historical Provider, MD  ferrous fumarate (HEMOCYTE - 106 MG FE) 325 (106 Fe) MG TABS tablet Take 1 tablet by mouth 2-3 times a week as tolerated. 04/20/16   Lennis Marion Downer, MD  fluticasone (FLONASE) 50 MCG/ACT nasal spray SHAKE LIQUID AND USE 2 SPRAYS IN [facility] NOSTRIL DAILY 09/15/16   Marletta Lor, MD  lisinopril-hydrochlorothiazide [facility]) 20-12.5 MG tablet TAKE 1 TABLET BY MOUTH EVERY DAY 09/01/16   Marletta Lor, MD  loperamide (IMODIUM) 2 MG capsule Take 1 capsule (2 mg total) by mouth as needed for diarrhea or loose stools. 01/24/13   Lennis Marion Downer, MD  ondansetron (ZOFRAN) 4 MG tablet Take 1 tablet (4 mg total) by mouth every 6 (six) hours. 11/28/16   Noland Fordyce, PA-C  oseltamivir (TAMIFLU) 75 MG capsule Take 1 capsule (75 mg total) by mouth every 12 (twelve) hours. 11/28/16   Noland Fordyce, PA-C  pantoprazole (PROTONIX) 40 MG tablet TAKE 1 TABLET BY MOUTH EVERY DAY 09/16/16   Marletta Lor, MD  simvastatin (ZOCOR) 40 MG tablet TAKE 1 TABLET BY MOUTH EVERY NIGHT AT BEDTIME 10/20/16   Marletta Lor, MD  traMADol (ULTRAM) 50 MG tablet Take 1 tablet (50 mg total) by mouth every 8 (eight) hours as needed. 01/21/17   Marletta Lor, MD    Family History Family History  Problem Relation Age of Onset  . Breast cancer Sister     dx in her 42s  . Heart disease Sister   . Heart disease Father   . Heart disease Mother   . Stroke Mother   . Prostate cancer Maternal Uncle   . Stroke Maternal Grandmother   . Testicular cancer Maternal Grandfather   . Stroke Maternal Grandfather   . Stroke Paternal Grandmother   . Heart disease Paternal Grandfather   . Breast cancer Sister 71     BRCA2+  . Breast cancer Sister 67  . Prostate cancer Maternal Uncle   . Breast cancer Other     maternal great aunt dx later in life    Social History Social History  Substance Use Topics  . Smoking status: Never Smoker  . Smokeless tobacco: Never Used  . Alcohol use No     Allergies   Ibuprofen and Morphine   Review of Systems Review of Systems  Constitutional: Negative for fever.  Gastrointestinal: Negative for abdominal pain, nausea and vomiting.  Genitourinary: Positive for dysuria. Negative  for flank pain.     Physical Exam Triage Vital Signs ED Triage Vitals  Enc Vitals Group     BP 02/06/17 1333 (!) 102/59     Pulse Rate 02/06/17 1333 84     Resp --      Temp 02/06/17 1333 97.8 F (36.6 C)     Temp Source 02/06/17 1333 Oral     SpO2 02/06/17 1333 99 %     Weight 02/06/17 1334 98 lb (44.5 kg)     Height --      Head Circumference --      Peak Flow --      Pain Score 02/06/17 1335 0     Pain Loc --      Pain Edu? --      Excl. in [place]? --    No data found.   Updated Vital Signs BP (!) 102/59 (BP Location: Right Arm)   Pulse 84   Temp 97.8 F (36.6 C) (Oral)   Wt 98 lb (44.5 kg)   SpO2 99%   BMI 18.52 kg/m   Visual Acuity Right Eye Distance:   Left Eye Distance:   Bilateral Distance:    Right Eye Near:   Left Eye Near:    Bilateral Near:     Physical Exam Nursing notes and Vital Signs reviewed. Appearance:  Patient appears stated age, and in no acute distress.    Eyes:  Pupils are equal, round, and reactive to light and accomodation.  Extraocular movement is intact.  Conjunctivae are not inflamed   Pharynx:  Normal; moist mucous membranes  Neck:  Supple.  No adenopathy Lungs:  Clear to auscultation.  Breath sounds are equal.  Moving air well. Heart:  Regular rate and rhythm without murmurs, rubs, or gallops.  Abdomen:  Nontender without masses or hepatosplenomegaly.  Bowel sounds are present.  No CVA or flank tenderness.  Extremities:   No edema.  Skin:  No rash present.     UC Treatments / Results  Labs (all labs ordered are listed, but only abnormal results are displayed) Labs Reviewed  POCT URINALYSIS DIP (MANUAL ENTRY) - Abnormal; Notable for the following:       Result Value   Clarity, UA cloudy (*)    Ketones, POC UA trace (5) (*)    Blood, UA small (*)    Nitrite, UA Positive (*)    Leukocytes, UA large (3+) (*)    All other components within normal limits  URINE CULTURE    EKG  EKG Interpretation None       Radiology No results found.  Procedures Procedures (including critical care time)  Medications Ordered in UC Medications - No data to display   Initial Impression / Assessment and Plan / UC Course  I have reviewed the triage vital signs and the nursing notes.  Pertinent labs & imaging results that were available during my care of the patient were reviewed by me and considered in my medical decision making (see chart for details).    Urine culture pending.  Begin Keflex. Continue increased fluid intake.  Followup with Family Doctor if not improved in one week.  If symptoms become significantly worse during the night or over the weekend, proceed to the local emergency room.     Final Clinical Impressions(s) / UC Diagnoses   Final diagnoses:  Cystitis    New Prescriptions New Prescriptions   CEPHALEXIN (KEFLEX) 500 MG CAPSULE    Take 1 capsule (500 mg total)  by mouth 2 (two) times daily.     Kandra Nicolas, MD 02/07/17 [phone]

## 2017-02-06 NOTE — Discharge Instructions (Signed)
Continue increased fluid intake. ° °If symptoms become significantly worse during the night or over the weekend, proceed to the local emergency room.  °

## 2017-02-08 ENCOUNTER — Telehealth: Payer: Self-pay | Admitting: *Deleted

## 2017-02-08 LAB — URINE CULTURE

## 2017-02-08 NOTE — Telephone Encounter (Signed)
Spoke to pt given Ucx results, complete ABT and call back if she has any questions or concerns. She reports that she is feeling better.

## 2017-02-17 DIAGNOSIS — L821 Other seborrheic keratosis: Secondary | ICD-10-CM | POA: Diagnosis not present

## 2017-02-17 DIAGNOSIS — Z85828 Personal history of other malignant neoplasm of skin: Secondary | ICD-10-CM | POA: Diagnosis not present

## 2017-02-22 ENCOUNTER — Encounter: Payer: Self-pay | Admitting: *Deleted

## 2017-02-22 ENCOUNTER — Emergency Department (INDEPENDENT_AMBULATORY_CARE_PROVIDER_SITE_OTHER)
Admission: EM | Admit: 2017-02-22 | Discharge: 2017-02-22 | Disposition: A | Discharge status: Home / Self Care | Payer: Medicare HMO | Source: Home / Self Care | Attending: Family Medicine | Admitting: Family Medicine

## 2017-02-22 DIAGNOSIS — R300 Dysuria: Secondary | ICD-10-CM

## 2017-02-22 DIAGNOSIS — N390 Urinary tract infection, site not specified: Secondary | ICD-10-CM

## 2017-02-22 LAB — POCT URINALYSIS DIP (MANUAL ENTRY)
Bilirubin, UA: NEGATIVE
Glucose, UA: NEGATIVE
Ketones, POC UA: NEGATIVE
Nitrite, UA: NEGATIVE
Spec Grav, UA: 1.015 (ref 1.030–1.035)
Urobilinogen, UA: NEGATIVE (ref ?–2.0)
pH, UA: 7.5 (ref 5.0–8.0)

## 2017-02-22 MED ORDER — CIPROFLOXACIN HCL 250 MG PO TABS: 250.0000 mg | ORAL_TABLET | Freq: Two times a day (BID) | ORAL | 0 refills | Status: DC

## 2017-02-22 NOTE — Discharge Instructions (Signed)
°  You have been prescribed a new antibiotic, ciprofloxacin, to help treat your recurrent bladder infection.   If you develop rash, joint pain or body pain, or other new concerning symptoms with the antibiotic, please stop taking and call our office or your primary care provider immediately so a different antibiotic can be prescribed.  It is important to follow up with your primary care provider to discuss your recurrent bladder infections. You may benefit from being on a daily low dose preventative antibiotic.

## 2017-02-22 NOTE — ED Triage Notes (Signed)
Pt c/o dysuria x today.

## 2017-02-22 NOTE — ED Provider Notes (Signed)
CSN: [id number]     Arrival date & time 02/22/17  1052 History   First MD Initiated Contact with Patient 02/22/17 1106     Chief Complaint  Patient presents with  . Dysuria   (Consider location/radiation/quality/duration/timing/severity/associated sxs/prior Treatment) HPI Judy Herrera is a 83 y.o. female presenting to UC with c/o sudden onset burning and discomfort with urination that stated this morning.  Pt has hx of recurrent UTIs.  She was [facility] at [facility] for her last UTI on 02/06/17. She was treated with Keflex. Symptoms did resolve completely but have come back. Denies fever, chills, abdominal pain or back pain.     Past Medical History:  Diagnosis Date  . Allergy   . Arthritis   . Breast CA ([place])    NO BP OR STICKS IN LEFT ARM  . CAD (coronary artery disease)   . Cancer ([place])   . Cardiomyopathy ([place])    improved  . Cataract   . Diverticulosis   . Eccrine porocarcinoma of skin   . History of chemotherapy   . History of radiation therapy 1994   left chest wall  . Hyperlipidemia   . Hypertension   . Osteoporosis   . Ovarian ca ([place])   . Wears dentures    full bottom-partial top  . Wears glasses    Past Surgical History:  Procedure Laterality Date  . ABDOMINAL HYSTERECTOMY  2004  . BREAST SURGERY    . CHOLECYSTECTOMY  1993  . EYE SURGERY     CATARACTS  . left breast mastectomy  1994   T3 N1 Mx  . MASS EXCISION Left 05/09/2014   Procedure: EXCISION OF NODULE LEFT CHEST ;  Surgeon: Pedro Earls, MD;  Location: [facility];  Service: General;  Laterality: Left;  Marland Kitchen MODIFIED RADICAL MASTECTOMY W/ AXILLARY LYMPH NODE DISSECTION W/ PECTORALIS MINOR EXCISION  1994   Left  . right leg  1990   fx  . SHOULDER ARTHROSCOPY    . [place]   replacement-lt  . SKIN BIOPSY  04/27/2011   left hand, forearm and posterior arm   Family History  Problem Relation Age of Onset  . Breast cancer Sister     dx in her 34s  . Heart disease Sister   .  Heart disease Father   . Heart disease Mother   . Stroke Mother   . Prostate cancer Maternal Uncle   . Stroke Maternal Grandmother   . Testicular cancer Maternal Grandfather   . Stroke Maternal Grandfather   . Stroke Paternal Grandmother   . Heart disease Paternal Grandfather   . Breast cancer Sister 53    BRCA2+  . Breast cancer Sister 68  . Prostate cancer Maternal Uncle   . Breast cancer Other     maternal great aunt dx later in life   Social History  Substance Use Topics  . Smoking status: Never Smoker  . Smokeless tobacco: Never Used  . Alcohol use No   OB History    No data available     Review of Systems  Constitutional: Negative for chills and fever.  Gastrointestinal: Negative for abdominal pain, diarrhea, nausea and vomiting.  Genitourinary: Positive for dysuria, frequency and urgency. Negative for flank pain, hematuria and pelvic pain.  Musculoskeletal: Negative for back pain and myalgias.    Allergies  Ibuprofen and Morphine  Home Medications   Prior to Admission medications   Medication Sig Start Date End Date Taking? Authorizing  Provider  aspirin EC 81 MG tablet Take 81 mg by mouth at bedtime.     Historical Provider, MD  calcium carbonate (TUMS - DOSED IN MG ELEMENTAL CALCIUM) 500 MG chewable tablet Chew 2 tablets by mouth daily with supper.    Historical Provider, MD  carvedilol (COREG) 25 MG tablet TAKE 1 TABLET BY MOUTH TWICE DAILY WITH FOOD 09/22/16   Marletta Lor, MD  Cholecalciferol (VITAMIN D) 2000 UNITS tablet Take 2,000 Units by mouth every morning.     Historical Provider, MD  ciprofloxacin (CIPRO) 250 MG tablet Take 1 tablet (250 mg total) by mouth 2 (two) times daily. For 3 days 02/22/17   Noland Fordyce, PA-C  Coenzyme Q10 (CO Q 10) 100 MG CAPS Take 1 capsule by mouth every morning.     Historical Provider, MD  denosumab (PROLIA) 60 MG/ML SOLN injection Inject 60 mg into the skin every 6 (six) months. Administer in upper arm, thigh, or  abdomen    Historical Provider, MD  ferrous fumarate (HEMOCYTE - 106 MG FE) 325 (106 Fe) MG TABS tablet Take 1 tablet by mouth 2-3 times a week as tolerated. 04/20/16   Lennis Marion Downer, MD  fluticasone (FLONASE) 50 MCG/ACT nasal spray SHAKE LIQUID AND USE 2 SPRAYS IN [facility] NOSTRIL DAILY 09/15/16   Marletta Lor, MD  lisinopril-hydrochlorothiazide [facility]) 20-12.5 MG tablet TAKE 1 TABLET BY MOUTH EVERY DAY 09/01/16   Marletta Lor, MD  loperamide (IMODIUM) 2 MG capsule Take 1 capsule (2 mg total) by mouth as needed for diarrhea or loose stools. 01/24/13   Lennis Marion Downer, MD  ondansetron (ZOFRAN) 4 MG tablet Take 1 tablet (4 mg total) by mouth every 6 (six) hours. 11/28/16   Noland Fordyce, PA-C  pantoprazole (PROTONIX) 40 MG tablet TAKE 1 TABLET BY MOUTH EVERY DAY 09/16/16   Marletta Lor, MD  simvastatin (ZOCOR) 40 MG tablet TAKE 1 TABLET BY MOUTH EVERY NIGHT AT BEDTIME 10/20/16   Marletta Lor, MD  traMADol (ULTRAM) 50 MG tablet Take 1 tablet (50 mg total) by mouth every 8 (eight) hours as needed. 01/21/17   Marletta Lor, MD   Meds Ordered and Administered this Visit  Medications - No data to display  BP (!) 159/72 (BP Location: Left Arm)   Pulse 84   Temp 97.9 F (36.6 C) (Oral)   Resp 16   Wt 96 lb (43.5 kg)   SpO2 97%   BMI 18.14 kg/m  No data found.   Physical Exam  Constitutional: She is oriented to person, place, and time. She appears well-developed and well-nourished. No distress.  HENT:  Head: Normocephalic and atraumatic.  Mouth/Throat: Oropharynx is clear and moist.  Eyes: EOM are normal.  Neck: Normal range of motion.  Cardiovascular: Normal rate and regular rhythm.   Pulmonary/Chest: Effort normal and breath sounds normal. No respiratory distress. She has no wheezes. She has no rales.  Musculoskeletal: Normal range of motion.  Neurological: She is alert and oriented to person, place, and time.  Skin: Skin is warm and dry. She is  not diaphoretic.  Psychiatric: She has a normal mood and affect. Her behavior is normal.  Nursing note and vitals reviewed.   Urgent Care Course     Procedures (including critical care time)  Labs Review Labs Reviewed  POCT URINALYSIS DIP (MANUAL ENTRY) - Abnormal; Notable for the following:       Result Value   Blood, UA trace-intact (*)    Protein  Ur, POC trace (*)    Leukocytes, UA moderate (2+) (*)    All other components within normal limits  URINE CULTURE    Imaging Review No results found.   MDM   1. Dysuria   2. Recurrent UTI    UA concerning for recurrent UTI Culture sent.   Rx: Cipro 250mg BID for 3 days Encouraged f/u with PCP for recheck of symptoms and to discuss preventative medication.  Patient verbalized understanding and agreement with treatment plan.     O'Malley, PA-C 02/22/17 1118  

## 2017-02-25 ENCOUNTER — Telehealth: Payer: Self-pay

## 2017-02-25 LAB — URINE CULTURE

## 2017-02-25 NOTE — Telephone Encounter (Signed)
Left message on VM with lab results, and to continue medication, and to call if any questions.

## 2017-03-12 ENCOUNTER — Telehealth: Payer: Self-pay | Admitting: Internal Medicine

## 2017-03-12 NOTE — Telephone Encounter (Signed)
Pt request refill  lisinopril-hydrochlorothiazide (PRINZIDE,ZESTORETIC) 20-12.5 MG tablet  [id number] - [place], [place] - [address] AT [address]

## 2017-03-15 MED ORDER — LISINOPRIL-HYDROCHLOROTHIAZIDE 20-12.5 MG PO TABS: 1.0000 | ORAL_TABLET | Freq: Every day | ORAL | 1 refills | Status: DC

## 2017-03-15 NOTE — Telephone Encounter (Signed)
Medication was refilled.

## 2017-03-29 ENCOUNTER — Emergency Department
Admission: EM | Admit: 2017-03-29 | Discharge: 2017-03-29 | Disposition: A | Discharge status: Home / Self Care | Payer: Medicare HMO | Source: Home / Self Care | Attending: Family Medicine | Admitting: Family Medicine

## 2017-03-29 DIAGNOSIS — N390 Urinary tract infection, site not specified: Secondary | ICD-10-CM | POA: Diagnosis not present

## 2017-03-29 LAB — POCT URINALYSIS DIP (MANUAL ENTRY)
Glucose, UA: 250 mg/dL — AB
PH UA: 5 (ref 5.0–8.0)
Spec Grav, UA: 1.01 (ref 1.010–1.025)

## 2017-03-29 MED ORDER — SULFAMETHOXAZOLE-TRIMETHOPRIM 800-160 MG PO TABS
1.0000 | ORAL_TABLET | Freq: Two times a day (BID) | ORAL | 0 refills | Status: AC
Start: 2017-03-29 — End: 2017-04-05

## 2017-03-29 NOTE — ED Triage Notes (Signed)
Pt started with urinary freq. And burning yesterday.

## 2017-03-29 NOTE — ED Provider Notes (Signed)
CSN: [id number]     Arrival date & time 03/29/17  0908 History   First MD Initiated Contact with Patient 03/29/17 [phone]     Chief Complaint  Patient presents with  . Urinary Frequency   (Consider location/radiation/quality/duration/timing/severity/associated sxs/prior Treatment) HPI  Judy Herrera is a 83 y.o. female presenting to UC with c/o urinary frequency and burning that started yesterday.  She has taken Azo with mild relief. Hx of recurrent UTIs. Last one was about 1 month ago. She was taking Ciprofloxacin for that UTI.  She has also done well with Keflex and Bactrim. Denies fever, chills, n/v/d. Denies abdominal or back pain.   Past Medical History:  Diagnosis Date  . Allergy   . Arthritis   . Breast CA ([place])    NO BP OR STICKS IN LEFT ARM  . CAD (coronary artery disease)   . Cancer ([place])   . Cardiomyopathy ([place])    improved  . Cataract   . Diverticulosis   . Eccrine porocarcinoma of skin   . History of chemotherapy   . History of radiation therapy 1994   left chest wall  . Hyperlipidemia   . Hypertension   . Osteoporosis   . Ovarian ca ([place])   . Wears dentures    full bottom-partial top  . Wears glasses    Past Surgical History:  Procedure Laterality Date  . ABDOMINAL HYSTERECTOMY  2004  . BREAST SURGERY    . CHOLECYSTECTOMY  1993  . EYE SURGERY     CATARACTS  . left breast mastectomy  1994   T3 N1 Mx  . MASS EXCISION Left 05/09/2014   Procedure: EXCISION OF NODULE LEFT CHEST ;  Surgeon: Pedro Earls, MD;  Location: [facility];  Service: General;  Laterality: Left;  Marland Kitchen MODIFIED RADICAL MASTECTOMY W/ AXILLARY LYMPH NODE DISSECTION W/ PECTORALIS MINOR EXCISION  1994   Left  . right leg  1990   fx  . SHOULDER ARTHROSCOPY    . [place]   replacement-lt  . SKIN BIOPSY  04/27/2011   left hand, forearm and posterior arm   Family History  Problem Relation Age of Onset  . Breast cancer Sister        dx in her 73s  . Heart  disease Sister   . Heart disease Father   . Heart disease Mother   . Stroke Mother   . Prostate cancer Maternal Uncle   . Stroke Maternal Grandmother   . Testicular cancer Maternal Grandfather   . Stroke Maternal Grandfather   . Stroke Paternal Grandmother   . Heart disease Paternal Grandfather   . Breast cancer Sister 18       BRCA2+  . Breast cancer Sister 88  . Prostate cancer Maternal Uncle   . Breast cancer Other        maternal great aunt dx later in life   Social History  Substance Use Topics  . Smoking status: Never Smoker  . Smokeless tobacco: Never Used  . Alcohol use No   OB History    No data available     Review of Systems  Constitutional: Negative for chills and fever.  Gastrointestinal: Negative for abdominal pain, diarrhea, nausea and vomiting.  Genitourinary: Positive for dysuria, frequency and urgency. Negative for decreased urine volume, flank pain and hematuria.  Musculoskeletal: Negative for back pain.    Allergies  Ibuprofen and Morphine  Home Medications   Prior to Admission medications  Medication Sig Start Date End Date Taking? Authorizing Provider  aspirin EC 81 MG tablet Take 81 mg by mouth at bedtime.     [provider]  calcium carbonate (TUMS - DOSED IN MG ELEMENTAL CALCIUM) 500 MG chewable tablet Chew 2 tablets by mouth daily with supper.    [provider]  carvedilol (COREG) 25 MG tablet TAKE 1 TABLET BY MOUTH TWICE DAILY WITH FOOD 09/22/16   Marletta Lor, MD  Cholecalciferol (VITAMIN D) 2000 UNITS tablet Take 2,000 Units by mouth every morning.     [provider]  ciprofloxacin (CIPRO) 250 MG tablet Take 1 tablet (250 mg total) by mouth 2 (two) times daily. For 3 days 02/22/17   Noland Fordyce, PA-C  Coenzyme Q10 (CO Q 10) 100 MG CAPS Take 1 capsule by mouth every morning.     [provider]  denosumab (PROLIA) 60 MG/ML SOLN injection Inject 60 mg into the skin every 6 (six) months.  Administer in upper arm, thigh, or abdomen    [provider]  ferrous fumarate (HEMOCYTE - 106 MG FE) 325 (106 Fe) MG TABS tablet Take 1 tablet by mouth 2-3 times a week as tolerated. 04/20/16   Gordy Levan, MD  fluticasone (FLONASE) 50 MCG/ACT nasal spray SHAKE LIQUID AND USE 2 SPRAYS IN EACH NOSTRIL DAILY 09/15/16   Marletta Lor, MD  lisinopril-hydrochlorothiazide (PRINZIDE,ZESTORETIC) 20-12.5 MG tablet Take 1 tablet by mouth daily. 03/15/17   Marletta Lor, MD  loperamide (IMODIUM) 2 MG capsule Take 1 capsule (2 mg total) by mouth as needed for diarrhea or loose stools. 01/24/13   Livesay, Lennis P, MD  ondansetron (ZOFRAN) 4 MG tablet Take 1 tablet (4 mg total) by mouth every 6 (six) hours. 11/28/16   Noland Fordyce, PA-C  pantoprazole (PROTONIX) 40 MG tablet TAKE 1 TABLET BY MOUTH EVERY DAY 09/16/16   Marletta Lor, MD  simvastatin (ZOCOR) 40 MG tablet TAKE 1 TABLET BY MOUTH EVERY NIGHT AT BEDTIME 10/20/16   Marletta Lor, MD  sulfamethoxazole-trimethoprim (BACTRIM DS,SEPTRA DS) 800-160 MG tablet Take 1 tablet by mouth 2 (two) times daily. 03/29/17 04/05/17  Noland Fordyce, PA-C  traMADol (ULTRAM) 50 MG tablet Take 1 tablet (50 mg total) by mouth every 8 (eight) hours as needed. 01/21/17   Marletta Lor, MD   Meds Ordered and Administered this Visit  Medications - No data to display  BP (!) 146/68 (BP Location: Left Arm)   Pulse 86   Temp 98.2 F (36.8 C) (Oral)   Ht 5' 5" (1.651 m)   Wt 96 lb (43.5 kg)   SpO2 98%   BMI 15.98 kg/m  No data found.   Physical Exam  Constitutional: She is oriented to person, place, and time. She appears well-developed and well-nourished. No distress.  HENT:  Head: Normocephalic and atraumatic.  Mouth/Throat: Oropharynx is clear and moist.  Eyes: EOM are normal.  Neck: Normal range of motion.  Cardiovascular: Normal rate and regular rhythm.   Pulmonary/Chest: Effort normal and breath sounds normal. No  respiratory distress. She has no wheezes. She has no rales.  Abdominal: Soft. She exhibits no distension. There is no tenderness. There is no CVA tenderness.  Musculoskeletal: Normal range of motion.  Neurological: She is alert and oriented to person, place, and time.  Skin: Skin is warm and dry. She is not diaphoretic.  Psychiatric: She has a normal mood and affect. Her behavior is normal.  Nursing note and vitals reviewed.  Urgent Care Course     Procedures (including critical care time)  Labs Review Labs Reviewed  POCT URINALYSIS DIP (MANUAL ENTRY) - Abnormal; Notable for the following:       Result Value   Color, UA orange (*)    Clarity, UA cloudy (*)    Glucose, UA =250 (*)    Bilirubin, UA large (*)    Ketones, POC UA small (15) (*)    Blood, UA moderate (*)    Protein Ur, POC >=300 (*)    Nitrite, UA   (*)    All other components within normal limits  URINE CULTURE    Imaging Review No results found.    MDM   1. Recurrent UTI    Hx and exam c/w recurrent UTI.  She was on Bactrim in January of this year and went 2 months w/o UTI. Will try bactrim again as she was last on Cipro and Keflex but had UTIs within 1 month of both medications.  Encouraged f/u with PCP or Urology as she may benefit from low dose preventative daily antibiotic.    Noland Fordyce, PA-C 03/29/17 1021

## 2017-04-01 ENCOUNTER — Telehealth: Payer: Self-pay | Admitting: *Deleted

## 2017-04-01 LAB — URINE CULTURE

## 2017-04-01 NOTE — Telephone Encounter (Signed)
Callback: Patient reports she is improving. Encouraged to complete antibiotic based on UCX report. Encouraged f/u with PCP or urology.

## 2017-05-01 ENCOUNTER — Encounter: Payer: Self-pay | Admitting: Emergency Medicine

## 2017-05-01 ENCOUNTER — Emergency Department
Admission: EM | Admit: 2017-05-01 | Discharge: 2017-05-01 | Disposition: A | Discharge status: Home / Self Care | Payer: Medicare HMO | Source: Home / Self Care | Attending: Family Medicine | Admitting: Family Medicine

## 2017-05-01 DIAGNOSIS — R300 Dysuria: Secondary | ICD-10-CM | POA: Diagnosis not present

## 2017-05-01 DIAGNOSIS — N390 Urinary tract infection, site not specified: Secondary | ICD-10-CM | POA: Diagnosis not present

## 2017-05-01 LAB — POCT URINALYSIS DIP (MANUAL ENTRY)
Glucose, UA: NEGATIVE mg/dL
Nitrite, UA: POSITIVE — AB
Spec Grav, UA: 1.01 (ref 1.010–1.025)
Urobilinogen, UA: NEGATIVE U/dL — AB
pH, UA: 6.5 (ref 5.0–8.0)

## 2017-05-01 MED ORDER — CEPHALEXIN 500 MG PO CAPS: 500.0000 mg | ORAL_CAPSULE | Freq: Two times a day (BID) | ORAL | 0 refills | Status: DC

## 2017-05-01 NOTE — ED Provider Notes (Signed)
-CSN: [id number]     Arrival date & time 05/01/17  1204 History   First MD Initiated Contact with Patient 05/01/17 1247     Chief Complaint  Patient presents with  . Dysuria   (Consider location/radiation/quality/duration/timing/severity/associated sxs/prior Treatment) HPI  Judy Herrera is a 83 y.o. female presenting to UC with c/o dysuria and urinary frequency that started yesterday.  Hx of recurrent UTIs about once a month.  Last UTI was 1 month ago.  She was prescribed bactrim, completed the antibiotic and symptoms complete resolved until yesterday.  She has been on Keflex, Cip, and Bactrim since March of 2018.  She has not f/u with PCP or urology as recommended for recurrent UTIs. Denies fever, chills, n/v/d. She has not tried anything for current symptoms.   Past Medical History:  Diagnosis Date  . Allergy   . Arthritis   . Breast CA ([place])    NO BP OR STICKS IN LEFT ARM  . CAD (coronary artery disease)   . Cancer ([place])   . Cardiomyopathy ([place])    improved  . Cataract   . Diverticulosis   . Eccrine porocarcinoma of skin   . History of chemotherapy   . History of radiation therapy 1994   left chest wall  . Hyperlipidemia   . Hypertension   . Osteoporosis   . Ovarian ca ([place])   . Wears dentures    full bottom-partial top  . Wears glasses    Past Surgical History:  Procedure Laterality Date  . ABDOMINAL HYSTERECTOMY  2004  . BREAST SURGERY    . CHOLECYSTECTOMY  1993  . EYE SURGERY     CATARACTS  . left breast mastectomy  1994   T3 N1 Mx  . MASS EXCISION Left 05/09/2014   Procedure: EXCISION OF NODULE LEFT CHEST ;  Surgeon: Pedro Earls, MD;  Location: [facility];  Service: General;  Laterality: Left;  Marland Kitchen MODIFIED RADICAL MASTECTOMY W/ AXILLARY LYMPH NODE DISSECTION W/ PECTORALIS MINOR EXCISION  1994   Left  . right leg  1990   fx  . SHOULDER ARTHROSCOPY    . [place]   replacement-lt  . SKIN BIOPSY  04/27/2011   left hand,  forearm and posterior arm   Family History  Problem Relation Age of Onset  . Breast cancer Sister        dx in her 58s  . Heart disease Sister   . Heart disease Father   . Heart disease Mother   . Stroke Mother   . Prostate cancer Maternal Uncle   . Stroke Maternal Grandmother   . Testicular cancer Maternal Grandfather   . Stroke Maternal Grandfather   . Stroke Paternal Grandmother   . Heart disease Paternal Grandfather   . Breast cancer Sister 75       BRCA2+  . Breast cancer Sister 7  . Prostate cancer Maternal Uncle   . Breast cancer Other        maternal great aunt dx later in life   Social History  Substance Use Topics  . Smoking status: Never Smoker  . Smokeless tobacco: Never Used  . Alcohol use No   OB History    No data available     Review of Systems  Constitutional: Negative for chills and fever.  Gastrointestinal: Negative for abdominal pain, diarrhea, nausea and vomiting.  Genitourinary: Positive for dysuria and frequency. Negative for hematuria, pelvic pain and urgency.  Musculoskeletal: Negative for  back pain.    Allergies  Ibuprofen and Morphine  Home Medications   Prior to Admission medications   Medication Sig Start Date End Date Taking? Authorizing Provider  aspirin EC 81 MG tablet Take 81 mg by mouth at bedtime.     [provider]  calcium carbonate (TUMS - DOSED IN MG ELEMENTAL CALCIUM) 500 MG chewable tablet Chew 2 tablets by mouth daily with supper.    [provider]  carvedilol (COREG) 25 MG tablet TAKE 1 TABLET BY MOUTH TWICE DAILY WITH FOOD 09/22/16   Marletta Lor, MD  cephALEXin (KEFLEX) 500 MG capsule Take 1 capsule (500 mg total) by mouth 2 (two) times daily. 05/01/17   Noe Gens, PA-C  Cholecalciferol (VITAMIN D) 2000 UNITS tablet Take 2,000 Units by mouth every morning.     [provider]  ciprofloxacin (CIPRO) 250 MG tablet Take 1 tablet (250 mg total) by mouth 2 (two) times daily. For 3 days  02/22/17   Noe Gens, PA-C  Coenzyme Q10 (CO Q 10) 100 MG CAPS Take 1 capsule by mouth every morning.     [provider]  denosumab (PROLIA) 60 MG/ML SOLN injection Inject 60 mg into the skin every 6 (six) months. Administer in upper arm, thigh, or abdomen    [provider]  ferrous fumarate (HEMOCYTE - 106 MG FE) 325 (106 Fe) MG TABS tablet Take 1 tablet by mouth 2-3 times a week as tolerated. 04/20/16   Gordy Levan, MD  fluticasone (FLONASE) 50 MCG/ACT nasal spray SHAKE LIQUID AND USE 2 SPRAYS IN EACH NOSTRIL DAILY 09/15/16   Marletta Lor, MD  lisinopril-hydrochlorothiazide (PRINZIDE,ZESTORETIC) 20-12.5 MG tablet Take 1 tablet by mouth daily. 03/15/17   Marletta Lor, MD  loperamide (IMODIUM) 2 MG capsule Take 1 capsule (2 mg total) by mouth as needed for diarrhea or loose stools. 01/24/13   Livesay, Lennis P, MD  ondansetron (ZOFRAN) 4 MG tablet Take 1 tablet (4 mg total) by mouth every 6 (six) hours. 11/28/16   Noe Gens, PA-C  pantoprazole (PROTONIX) 40 MG tablet TAKE 1 TABLET BY MOUTH EVERY DAY 09/16/16   Marletta Lor, MD  simvastatin (ZOCOR) 40 MG tablet TAKE 1 TABLET BY MOUTH EVERY NIGHT AT BEDTIME 10/20/16   Marletta Lor, MD  traMADol (ULTRAM) 50 MG tablet Take 1 tablet (50 mg total) by mouth every 8 (eight) hours as needed. 01/21/17   Marletta Lor, MD   Meds Ordered and Administered this Visit  Medications - No data to display  BP (!) 170/80 (BP Location: Right Arm)   Pulse 100   Temp 98.6 F (37 C) (Oral)   Wt 97 lb (44 kg)   SpO2 97%   BMI 16.14 kg/m  No data found.   Physical Exam  Constitutional: She is oriented to person, place, and time. She appears well-developed and well-nourished. No distress.  HENT:  Head: Normocephalic and atraumatic.  Mouth/Throat: Oropharynx is clear and moist.  Eyes: EOM are normal.  Neck: Normal range of motion.  Cardiovascular: Normal rate and regular rhythm.   Pulmonary/Chest:  Effort normal and breath sounds normal. No respiratory distress. She has no wheezes. She has no rales.  Abdominal: Soft. She exhibits no distension. There is no tenderness. There is no CVA tenderness.  Musculoskeletal: Normal range of motion.  Neurological: She is alert and oriented to person, place, and time.  Skin: Skin is warm and dry. She is not diaphoretic.  Psychiatric: She has a normal mood and affect. Her behavior is normal.  Nursing note and vitals reviewed.   Urgent Care Course     Procedures (including critical care time)  Labs Review Labs Reviewed  POCT URINALYSIS DIP (MANUAL ENTRY) - Abnormal; Notable for the following:       Result Value   Clarity, UA cloudy (*)    Bilirubin, UA moderate (*)    Ketones, POC UA trace (5) (*)    Blood, UA small (*)    Protein Ur, POC trace (*)    Urobilinogen, UA negative (*)    Nitrite, UA Positive (*)    Leukocytes, UA Large (3+) (*)    All other components within normal limits  URINE CULTURE    Imaging Review No results found.    MDM   1. Recurrent UTI    Hx and exam c/w UTI Reviewed prior urine cultures.  Rx: Keflex  Encouraged f/u with PCP or urologist as pt may benefit from being on prophylactic medication for recurrent UTIs.    Noe Gens, [place] 05/01/17 1606

## 2017-05-01 NOTE — ED Triage Notes (Signed)
Pt c/o dysuria that started yesterday. Denies otc meds for this.

## 2017-05-04 ENCOUNTER — Telehealth: Payer: Self-pay | Admitting: *Deleted

## 2017-05-04 LAB — URINE CULTURE

## 2017-05-04 NOTE — Telephone Encounter (Signed)
Spoke to pt given Ucx results, advised her to complete ABT and to call back if she has any questions or concerns.

## 2017-05-08 ENCOUNTER — Other Ambulatory Visit: Payer: Self-pay | Admitting: Internal Medicine

## 2017-05-29 ENCOUNTER — Other Ambulatory Visit: Payer: Self-pay | Admitting: Internal Medicine

## 2017-06-30 DIAGNOSIS — Z48817 Encounter for surgical aftercare following surgery on the skin and subcutaneous tissue: Secondary | ICD-10-CM | POA: Diagnosis not present

## 2017-06-30 DIAGNOSIS — Z85828 Personal history of other malignant neoplasm of skin: Secondary | ICD-10-CM | POA: Diagnosis not present

## 2017-07-07 ENCOUNTER — Ambulatory Visit (HOSPITAL_BASED_OUTPATIENT_CLINIC_OR_DEPARTMENT_OTHER): Payer: Medicare HMO | Admitting: Hematology

## 2017-07-07 ENCOUNTER — Telehealth: Payer: Self-pay | Admitting: Hematology

## 2017-07-07 ENCOUNTER — Encounter: Payer: Self-pay | Admitting: Hematology

## 2017-07-07 VITALS — BP 153/55 | HR 100 | Temp 98.2°F | Resp 17 | Ht 62.0 in | Wt 97.1 lb

## 2017-07-07 DIAGNOSIS — R600 Localized edema: Secondary | ICD-10-CM | POA: Diagnosis not present

## 2017-07-07 DIAGNOSIS — M810 Age-related osteoporosis without current pathological fracture: Secondary | ICD-10-CM | POA: Diagnosis not present

## 2017-07-07 DIAGNOSIS — Z8543 Personal history of malignant neoplasm of ovary: Secondary | ICD-10-CM | POA: Diagnosis not present

## 2017-07-07 DIAGNOSIS — Z853 Personal history of malignant neoplasm of breast: Secondary | ICD-10-CM | POA: Diagnosis not present

## 2017-07-07 DIAGNOSIS — Z1239 Encounter for other screening for malignant neoplasm of breast: Secondary | ICD-10-CM

## 2017-07-07 DIAGNOSIS — Z803 Family history of malignant neoplasm of breast: Secondary | ICD-10-CM | POA: Diagnosis not present

## 2017-07-07 DIAGNOSIS — C4499 Other specified malignant neoplasm of skin, unspecified: Secondary | ICD-10-CM

## 2017-07-07 DIAGNOSIS — E559 Vitamin D deficiency, unspecified: Secondary | ICD-10-CM | POA: Diagnosis not present

## 2017-07-07 DIAGNOSIS — C44609 Unspecified malignant neoplasm of skin of left upper limb, including shoulder: Secondary | ICD-10-CM

## 2017-07-07 DIAGNOSIS — R5383 Other fatigue: Secondary | ICD-10-CM | POA: Diagnosis not present

## 2017-07-07 NOTE — Telephone Encounter (Signed)
Per 8/22 los - Please help setup f/u with Dr Jonette Eva at [facility] in 2-3 months   [facility] [place] in [place] - he is aware appt needs to be scheduled and will relay info to Dr. Jonette Eva.

## 2017-07-07 NOTE — Patient Instructions (Signed)
Thank you for choosing [facility] to provide your oncology and hematology care.  To afford each patient quality time with our providers, please arrive 30 minutes before your scheduled appointment time.  If you arrive late for your appointment, you may be asked to reschedule.  We strive to give you quality time with our providers, and arriving late affects you and other patients whose appointments are after yours.   If you are a no show for multiple scheduled visits, you may be dismissed from the clinic at the providers discretion.    Again, thank you for choosing [facility], our hope is that these requests will decrease the amount of time that you wait before being seen by our physicians.  ______________________________________________________________________  Should you have questions after your visit to the  Cancer Center, please contact our office at [phone] between the hours of 8:30 and 4:30 p.m.    Voicemails left after 4:30p.m will not be returned until the following business day.    For prescription refill requests, please have your pharmacy contact us directly.  Please also try to allow 48 hours for prescription requests.    Please contact the scheduling department for questions regarding scheduling.  For scheduling of procedures such as PET scans, CT scans, MRI, Ultrasound, etc please contact central scheduling at [phone].    Resources For Cancer Patients and Caregivers:   Oncolink.org:  A wonderful resource for patients and healthcare providers for information regarding your disease, ways to tract your treatment, what to expect, etc.     American Cancer Society:  800-227-2345  Can help patients locate various types of support and financial assistance  Cancer Care: 1-800-813-HOPE (4673) Provides financial assistance, online support groups, medication/co-pay assistance.    [place] DSS:  336-641-3447 Where to apply for food  stamps, Medicaid, and utility assistance  Medicare Rights Center: 800-333-4114 Helps people with Medicare understand their rights and benefits, navigate the Medicare system, and secure the quality healthcare they deserve  [employer]: 336-333-6589 [place] [employer]'s shared-ride transportation service for eligible riders who have a disability that prevents them from riding the fixed route bus.    For additional information on assistance programs please contact our social worker:   Grier Hock/Abigail Elmore:  [phone]            

## 2017-07-13 NOTE — Progress Notes (Signed)
Marland Kitchen  HEMATOLOGY ONCOLOGY PROGRESS NOTE  Date of service: .07/07/2017  Patient Care Team: Marletta Lor, MD as PCP - General  CC : f/u for LUE porocarcinoma and h/o  Breast cancer.  SUMMARY OF ONCOLOGIC HISTORY:   Eccrine porocarcinoma of skin   04/01/2012 Initial Diagnosis    Eccrine porocarcinoma of skin       INTERVAL HISTORY:  Patient is here for f/u. She was last seen by Dr Marko Plume on 10/19/2017 for LUE porocarcinoma and previous h/o of breast cancer in 1994 LN+ with LUE lymphedema.  She notes that she is overall doing okay. One of her skin lesions was bleeding but this has now stopped. Notes no new breast lumps or other significant focal symptoms. Follows with Dr Allyson Sabal. No PC/SOB/abd distention/abd pain.  REVIEW OF SYSTEMS:    10 Point review of systems of done and is negative except as noted above.  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 She had re-excisions of all areas (dorsum left hand, 2 on left forearm and left arm) by Dr Ronnette Hila. She had significant lymphedema LUE after those procedures, with history of left breast cancer with 20 node left axillary dissection in 1994 followed by radiation. Repeat scans at [facility] in 05-2012 had no distant disease. With progressive in transit mets, she had hyperthermic limb perfusion by Dr Clovis Riley at [facility] in 06-2012 and excision of an area on dorsum of left hand in Oct 2013. She had significant swelling of LUE after the limb perfusion, which gradually improved, and progression of the in transit mets LUE after the limb perfusion. She saw Dr Turner Daniels at [facility], with recommendation for gemzar/taxotere as systemic sarcoma regimen, chosen in part due to her prior chemotherapy. She had no evidence of disease on CXR at [facility] 11-23-2012. She had single cycle of dose reduced gemcitabine taxotere Feb 2014, tolerated very poorly, with hospitalization  due to pancytopenia despite neulasta, requiring transfusions of PRBCs and platelets, as well as severe diarrhea. PS took several months to improve back to baseline after that attempt at chemotherapy. She prefers no further chemo for the porocarcinoma. She did have good improvement in bothersome tumor nodule on left olecranon process with RT by Dr Tammi Klippel 05-2013, with 40 Gy in 10 fractions. She had additional 40 Gy to symptomatic area of the porocarcinoma 4-20 thru 03-19-14. She had wide excisions of area at left olecranon and upper arm by Dr Clovis Riley 03-12-15.  LEFT BREAST CANCER T3N1 (2 of 20 nodes) ER+ in 1994, treated with mastectomy and axillary node dissection, adriamycin/cytoxan x4, RT by Dr Berton Mount (records not available now), and tamoxifen x 5 years. Last right mammogram was in [facility] 06-26-15, with heterogeneously dense breast tissue, otherwise no radiographic findings of concern. BRCA testing negative x2, tho sister BRCA 2+ and 4 of 5 sisters with breast cancer.  IA CLEAR CELL OVARIAN 2004, treated with surgery followed by 3 cycles of taxol/ carboplatin. She saw Dr Josephina Shih [phone] and will see him yearly. BRCA testing negative x2, tho sister BRCA 2+ and 4 of 5 sisters with breast cancer, no other gyn cancer. . Past Medical History:  Diagnosis Date  . Allergy   . Arthritis   . Breast CA ([place])    NO BP OR STICKS IN LEFT ARM  . CAD (coronary artery disease)   . Cancer ([place])   . Cardiomyopathy ([place])    improved  . Cataract   .  Diverticulosis   . Eccrine porocarcinoma of skin   . History of chemotherapy   . History of radiation therapy 1994   left chest wall  . Hyperlipidemia   . Hypertension   . Osteoporosis   . Ovarian ca ([place])   . Wears dentures    full bottom-partial top  . Wears glasses     . Past Surgical History:  Procedure Laterality Date  . ABDOMINAL HYSTERECTOMY  2004  . BREAST SURGERY    . CHOLECYSTECTOMY  1993  . EYE SURGERY     CATARACTS  .  left breast mastectomy  1994   T3 N1 Mx  . MASS EXCISION Left 05/09/2014   Procedure: EXCISION OF NODULE LEFT CHEST ;  Surgeon: Pedro Earls, MD;  Location: [facility];  Service: General;  Laterality: Left;  Marland Kitchen MODIFIED RADICAL MASTECTOMY W/ AXILLARY LYMPH NODE DISSECTION W/ PECTORALIS MINOR EXCISION  1994   Left  . right leg  1990   fx  . SHOULDER ARTHROSCOPY    . [place]   replacement-lt  . SKIN BIOPSY  04/27/2011   left hand, forearm and posterior arm    . Social History  Substance Use Topics  . Smoking status: Never Smoker  . Smokeless tobacco: Never Used  . Alcohol use No    ALLERGIES:  is allergic to ibuprofen and morphine.  MEDICATIONS:  Current Outpatient Prescriptions  Medication Sig Dispense Refill  . aspirin EC 81 MG tablet Take 81 mg by mouth at bedtime.     . calcium carbonate (TUMS - DOSED IN MG ELEMENTAL CALCIUM) 500 MG chewable tablet Chew 2 tablets by mouth daily with supper.    . carvedilol (COREG) 25 MG tablet TAKE 1 TABLET TWICE A DAY WITH FOOD 180 tablet 0  . cephALEXin (KEFLEX) 500 MG capsule Take 1 capsule (500 mg total) by mouth 2 (two) times daily. 20 capsule 0  . Cholecalciferol (VITAMIN D) 2000 UNITS tablet Take 2,000 Units by mouth every morning.     . ciprofloxacin (CIPRO) 250 MG tablet Take 1 tablet (250 mg total) by mouth 2 (two) times daily. For 3 days 6 tablet 0  . Coenzyme Q10 (CO Q 10) 100 MG CAPS Take 1 capsule by mouth every morning.     . denosumab (PROLIA) 60 MG/ML SOLN injection Inject 60 mg into the skin every 6 (six) months. Administer in upper arm, thigh, or abdomen    . ferrous fumarate (HEMOCYTE - 106 MG FE) 325 (106 Fe) MG TABS tablet Take 1 tablet by mouth 2-3 times a week as tolerated. 30 each 1  . fluticasone (FLONASE) 50 MCG/ACT nasal spray SHAKE LIQUID AND USE 2 SPRAYS IN EACH NOSTRIL DAILY 48 g 3  . lisinopril-hydrochlorothiazide (PRINZIDE,ZESTORETIC) 20-12.5 MG tablet Take 1 tablet by mouth  daily. 90 tablet 1  . loperamide (IMODIUM) 2 MG capsule Take 1 capsule (2 mg total) by mouth as needed for diarrhea or loose stools. 20 capsule 0  . ondansetron (ZOFRAN) 4 MG tablet Take 1 tablet (4 mg total) by mouth every 6 (six) hours. 12 tablet 0  . pantoprazole (PROTONIX) 40 MG tablet TAKE 1 TABLET BY MOUTH EVERY DAY 90 tablet 1  . simvastatin (ZOCOR) 40 MG tablet TAKE 1 TABLET AT BEDTIME 90 tablet 0  . traMADol (ULTRAM) 50 MG tablet Take 1 tablet (50 mg total) by mouth every 8 (eight) hours as needed. 30 tablet 0   No current facility-administered medications for this visit.  PHYSICAL EXAMINATION: ECOG PERFORMANCE STATUS: 1 - Symptomatic but completely ambulatory  . Vitals:   07/07/17 1356  BP: (!) 153/55  Pulse: 100  Resp: 17  Temp: 98.2 F (36.8 C)  SpO2: 100%    Filed Weights   07/07/17 1356  Weight: 97 lb 1.6 oz (44 kg)   .Body mass index is 17.76 kg/m.  GENERAL:alert, in no acute distress and comfortable SKIN: multiple skin lesions from porocarcinoma on the LUE. Does not note significant change in any specific skin lesion. EYES: conjunctiva are pink and non-injected, sclera anicteric OROPHARYNX: MMM, no exudates, no oropharyngeal erythema or ulceration NECK: supple, no JVD LYMPH:  no palpable lymphadenopathy in the cervical, axillary or inguinal regions LUNGS: clear to auscultation b/l with normal respiratory effort HEART: regular rate & rhythm ABDOMEN:  normoactive bowel sounds , non tender, not distended. Extremity: no pedal edema PSYCH: alert & oriented x 3 with fluent speech NEURO: no focal motor/sensory deficits  LABORATORY DATA:   I have reviewed the data as listed  . CBC Latest Ref Rng & Units 12/07/2016 10/19/2016 04/20/2016  WBC 4.0 - 10.5 K/uL 6.7 4.1 3.7(L)  Hemoglobin 12.0 - 15.0 g/dL 9.5(L) 10.4(L) 10.2(L)  Hematocrit 36.0 - 46.0 % 28.0(L) 31.0(L) 29.8(L)  Platelets 150.0 - 400.0 K/uL 259.0 151 137(L)    . CMP Latest Ref Rng & Units  12/07/2016 04/20/2016 12/18/2015  Glucose 70 - 99 mg/dL 101(H) 86 -  BUN 6 - 23 mg/dL 13 24.2 14  Creatinine 0.40 - 1.20 mg/dL 0.77 1.2(H) 1.1  Sodium 135 - 145 mEq/L 134(L) 134(L) 131(A)  Potassium 3.5 - 5.1 mEq/L 3.3(L) 3.8 4.3  Chloride 96 - 112 mEq/L 91(L) - -  CO2 19 - 32 mEq/L 34(H) 30(H) -  Calcium 8.4 - 10.5 mg/dL 9.0 9.3 -  Total Protein 6.0 - 8.3 g/dL 6.0 6.1(L) -  Total Bilirubin 0.2 - 1.2 mg/dL 1.3(H) 1.29(H) -  Alkaline Phos 39 - 117 U/L 62 38(L) 37  AST 0 - 37 U/L 39(H) 15 18  ALT 0 - 35 U/L 30 11 24      RADIOGRAPHIC STUDIES: I have personally reviewed the radiological images as listed and agreed with the findings in the report. No results found.  ASSESSMENT & PLAN:   1.Porocarcinoma LUE with in transit mets: Overall has had relatively indolent disease with no overt progression since her last clinic visit. -no indication for specific therapeutic intervention at this time. -previously palliative treatment (patient did not tolerate attempt at chemo almost 4 years ago, does not want further chemo.  Some excisions and directed radiation have been useful prn. )  2.T3N1 left breast cancer treated with mastectomy and 20 node axillary dissection 1994, RT and chemo, without known active disease.  Right mammogram due 06-2017, dense breast tissue. Plan -MMG with PCP -no clinical evidence of overt breast cancer recurrence at this time.  3.LUE lymphedema related to axillary node dissection and radiation, has worsened from interventions for porocarcinoma on that arm but presently stable.   4.IA ovarian cancer 2004. No known recurrent disease. Followed by Dr Josephina Shih  5.left vocal cord paralysis thought related to previous chest RT with scarring when this was identified in fall 2015. Swallowing study 10-2015 no aspiration, and patient is careful with swallowing.  6.cardiomyopathy 2008. Syncopal episode while driving, echo done and event monitor. Not driving now. No further  syncope. Atherosclerotic disease by CT. Aortic aneurysm 4 cm by CT. 7.osteoporosis with vertebral compression fracture previously. Chronic back pain not related  to malignancy. She dislikes any pain medication, occasionally uses tramadol 8.PAC removed 9. Sister BRCA 2+. Patient does not have that same mutation on testing x2, which may be simply limitation of testing. 4 of 5 sisters with breast cancer 10. Excisional biopsy of left chest nodule by Dr Hassell Done 04-2013, benign  Patient lives in [place] close to the [facility] and would like to f/u with Dr Jonette Eva -- referral given.  -Please help setup f/u with Dr Jonette Eva at [facility] in 2-3 months as per patients preference for continue f/u of Breast Cancer/Ovarian cancer and Porocarcinoma.   I spent 30 minutes counseling the patient face to face. The total time spent in the appointment was 40 minutes and more than 50% was on counseling and direct patient cares.    Sullivan Lone MD [place] AAHIVMS [facility] Hematology/Oncology Physician [facility]  (Office):       [phone] (Work cell):  [phone] (Fax):           [phone]

## 2017-07-14 ENCOUNTER — Ambulatory Visit (INDEPENDENT_AMBULATORY_CARE_PROVIDER_SITE_OTHER): Payer: Medicare HMO | Admitting: Internal Medicine

## 2017-07-14 ENCOUNTER — Encounter: Payer: Self-pay | Admitting: Internal Medicine

## 2017-07-14 VITALS — BP 148/88 | HR 92 | Temp 98.4°F | Wt 94.6 lb

## 2017-07-14 DIAGNOSIS — I420 Dilated cardiomyopathy: Secondary | ICD-10-CM | POA: Diagnosis not present

## 2017-07-14 DIAGNOSIS — I712 Thoracic aortic aneurysm, without rupture: Secondary | ICD-10-CM

## 2017-07-14 DIAGNOSIS — I2510 Atherosclerotic heart disease of native coronary artery without angina pectoris: Secondary | ICD-10-CM | POA: Diagnosis not present

## 2017-07-14 DIAGNOSIS — I10 Essential (primary) hypertension: Secondary | ICD-10-CM | POA: Diagnosis not present

## 2017-07-14 DIAGNOSIS — I7121 Aneurysm of the ascending aorta, without rupture: Secondary | ICD-10-CM

## 2017-07-14 NOTE — Patient Instructions (Signed)
Limit your sodium (Salt) intake    It is important that you exercise regularly, at least 20 minutes 3 to 4 times per week.  If you develop chest pain or shortness of breath seek  medical attention.  Return in 6 months for follow-up  

## 2017-07-14 NOTE — Progress Notes (Signed)
Subjective:    Patient ID: Judy Herrera, female    DOB: Sep 18, 1931, 83 y.o.   MRN: [id number]  HPI  83 year old patient who is seen today for her biannual follow-up. She has been seen by oncology.  Recently for follow-up of eccrine porocarcinoma of the skin.  She also has a history of breast cancer and thrombocytopenia.  She has done quite well.  She does manage chronic lymphedema of the left arm, which has been stable.  Her skin lesions have also remained stable She has essential hypertension. More recently she has been seen in the ED a number of times for recurrent urinary tract infections.  This has been stable for the past 2 months  Son has noted some forgetfulness. MMSE 26/30  Past Medical History:  Diagnosis Date  . Allergy   . Arthritis   . Breast CA ([place])    NO BP OR STICKS IN LEFT ARM  . CAD (coronary artery disease)   . Cancer ([place])   . Cardiomyopathy ([place])    improved  . Cataract   . Diverticulosis   . Eccrine porocarcinoma of skin   . History of chemotherapy   . History of radiation therapy 1994   left chest wall  . Hyperlipidemia   . Hypertension   . Osteoporosis   . Ovarian ca ([place])   . Wears dentures    full bottom-partial top  . Wears glasses      Social History   Social History  . Marital status: Widowed    Spouse name: N/A  . Number of children: 1  . Years of education: N/A   Occupational History  . Not on file.   Social History Main Topics  . Smoking status: Never Smoker  . Smokeless tobacco: Never Used  . Alcohol use No  . Drug use: No  . Sexual activity: Not on file     Comment: didn't ask   Other Topics Concern  . Not on file   Social History Narrative  . No narrative on file    Past Surgical History:  Procedure Laterality Date  . ABDOMINAL HYSTERECTOMY  2004  . BREAST SURGERY    . CHOLECYSTECTOMY  1993  . EYE SURGERY     CATARACTS  . left breast mastectomy  1994   T3 N1 Mx  . MASS EXCISION Left 05/09/2014   Procedure: EXCISION OF NODULE LEFT CHEST ;  Surgeon: Pedro Earls, MD;  Location: [facility];  Service: General;  Laterality: Left;  Marland Kitchen MODIFIED RADICAL MASTECTOMY W/ AXILLARY LYMPH NODE DISSECTION W/ PECTORALIS MINOR EXCISION  1994   Left  . right leg  1990   fx  . SHOULDER ARTHROSCOPY    . [place]   replacement-lt  . SKIN BIOPSY  04/27/2011   left hand, forearm and posterior arm    Family History  Problem Relation Age of Onset  . Breast cancer Sister        dx in her 72s  . Heart disease Sister   . Heart disease Father   . Heart disease Mother   . Stroke Mother   . Prostate cancer Maternal Uncle   . Stroke Maternal Grandmother   . Testicular cancer Maternal Grandfather   . Stroke Maternal Grandfather   . Stroke Paternal Grandmother   . Heart disease Paternal Grandfather   . Breast cancer Sister 19       BRCA2+  . Breast cancer Sister 30  .  Prostate cancer Maternal Uncle   . Breast cancer Other        maternal great aunt dx later in life    Allergies  Allergen Reactions  . Ibuprofen Nausea Only  . Morphine Nausea And Vomiting    Current Outpatient Prescriptions on File Prior to Visit  Medication Sig Dispense Refill  . aspirin EC 81 MG tablet Take 81 mg by mouth at bedtime.     . calcium carbonate (TUMS - DOSED IN MG ELEMENTAL CALCIUM) 500 MG chewable tablet Chew 2 tablets by mouth daily with supper.    . carvedilol (COREG) 25 MG tablet TAKE 1 TABLET TWICE A DAY WITH FOOD 180 tablet 0  . cephALEXin (KEFLEX) 500 MG capsule Take 1 capsule (500 mg total) by mouth 2 (two) times daily. 20 capsule 0  . Cholecalciferol (VITAMIN D) 2000 UNITS tablet Take 2,000 Units by mouth every morning.     . ciprofloxacin (CIPRO) 250 MG tablet Take 1 tablet (250 mg total) by mouth 2 (two) times daily. For 3 days 6 tablet 0  . Coenzyme Q10 (CO Q 10) 100 MG CAPS Take 1 capsule by mouth every morning.     . denosumab (PROLIA) 60 MG/ML SOLN injection Inject 60  mg into the skin every 6 (six) months. Administer in upper arm, thigh, or abdomen    . ferrous fumarate (HEMOCYTE - 106 MG FE) 325 (106 Fe) MG TABS tablet Take 1 tablet by mouth 2-3 times a week as tolerated. 30 each 1  . fluticasone (FLONASE) 50 MCG/ACT nasal spray SHAKE LIQUID AND USE 2 SPRAYS IN EACH NOSTRIL DAILY 48 g 3  . lisinopril-hydrochlorothiazide (PRINZIDE,ZESTORETIC) 20-12.5 MG tablet Take 1 tablet by mouth daily. 90 tablet 1  . loperamide (IMODIUM) 2 MG capsule Take 1 capsule (2 mg total) by mouth as needed for diarrhea or loose stools. 20 capsule 0  . ondansetron (ZOFRAN) 4 MG tablet Take 1 tablet (4 mg total) by mouth every 6 (six) hours. 12 tablet 0  . pantoprazole (PROTONIX) 40 MG tablet TAKE 1 TABLET BY MOUTH EVERY DAY 90 tablet 1  . simvastatin (ZOCOR) 40 MG tablet TAKE 1 TABLET AT BEDTIME 90 tablet 0  . traMADol (ULTRAM) 50 MG tablet Take 1 tablet (50 mg total) by mouth every 8 (eight) hours as needed. 30 tablet 0   No current facility-administered medications on file prior to visit.     BP (!) 148/88 (BP Location: Left Arm, Patient Position: Sitting, Cuff Size: Small)   Pulse 92   Temp 98.4 F (36.9 C) (Oral)   Wt 94 lb 9.6 oz (42.9 kg)   SpO2 97%   BMI 17.30 kg/m     Review of Systems  Constitutional: Negative.   HENT: Negative for congestion, dental problem, hearing loss, rhinorrhea, sinus pressure, sore throat and tinnitus.   Eyes: Negative for pain, discharge and visual disturbance.  Respiratory: Negative for cough and shortness of breath.   Cardiovascular: Negative for chest pain, palpitations and leg swelling.  Gastrointestinal: Negative for abdominal distention, abdominal pain, blood in stool, constipation, diarrhea, nausea and vomiting.  Genitourinary: Negative for difficulty urinating, dysuria, flank pain, frequency, hematuria, pelvic pain, urgency, vaginal bleeding, vaginal discharge and vaginal pain.  Musculoskeletal: Negative for arthralgias, gait  problem and joint swelling.  Skin: Positive for wound. Negative for rash.  Neurological: Negative for dizziness, syncope, speech difficulty, weakness, numbness and headaches.  Hematological: Negative for adenopathy.  Psychiatric/Behavioral: Positive for confusion and decreased concentration. Negative for agitation, behavioral  problems and dysphoric mood. The patient is not nervous/anxious.        Objective:   Physical Exam  Constitutional: She is oriented to person, place, and time. She appears well-developed and well-nourished. No distress.  Elderly Frail Weight 90 4.6 Repeat blood pressure 140/70  HENT:  Head: Normocephalic.  Right Ear: External ear normal.  Left Ear: External ear normal.  Mouth/Throat: Oropharynx is clear and moist.  Eyes: Pupils are equal, round, and reactive to light. Conjunctivae and EOM are normal.  Neck: Normal range of motion. Neck supple. No thyromegaly present.  Cardiovascular: Normal rate, regular rhythm, normal heart sounds and intact distal pulses.   Pulmonary/Chest: Effort normal and breath sounds normal.  Abdominal: Soft. Bowel sounds are normal. She exhibits no mass. There is no tenderness.  Musculoskeletal: Normal range of motion.  Lymphadenopathy:    She has no cervical adenopathy.  Neurological: She is alert and oriented to person, place, and time.  Skin: Skin is warm and dry. No rash noted.  Chronic lymphedema left arm with multiple scattered skin lesions involving the left arm  Psychiatric: She has a normal mood and affect. Her behavior is normal.          Assessment & Plan:   Essential hypertension, stable Osteoporosis.  Continue biannual Prolia. Eccrine poro carcinoma of the skin involving the left arm History of dilated cardiomyopathy, stable History of CAD stable Memory impairment.  MMSE 26/30-we'll continue to follow.  Annual exam 6 months  Nyoka Cowden

## 2017-07-28 ENCOUNTER — Other Ambulatory Visit: Payer: Self-pay | Admitting: *Deleted

## 2017-07-28 DIAGNOSIS — C569 Malignant neoplasm of unspecified ovary: Secondary | ICD-10-CM

## 2017-07-29 ENCOUNTER — Ambulatory Visit (HOSPITAL_BASED_OUTPATIENT_CLINIC_OR_DEPARTMENT_OTHER): Payer: Medicare HMO | Admitting: Hematology & Oncology

## 2017-07-29 ENCOUNTER — Other Ambulatory Visit (HOSPITAL_BASED_OUTPATIENT_CLINIC_OR_DEPARTMENT_OTHER): Payer: Medicare HMO

## 2017-07-29 ENCOUNTER — Other Ambulatory Visit: Payer: Medicare HMO

## 2017-07-29 ENCOUNTER — Ambulatory Visit: Payer: Medicare HMO | Admitting: Hematology & Oncology

## 2017-07-29 VITALS — BP 167/63 | HR 102 | Temp 98.1°F | Resp 18 | Wt 100.0 lb

## 2017-07-29 DIAGNOSIS — C569 Malignant neoplasm of unspecified ovary: Secondary | ICD-10-CM | POA: Diagnosis not present

## 2017-07-29 DIAGNOSIS — Z853 Personal history of malignant neoplasm of breast: Secondary | ICD-10-CM

## 2017-07-29 DIAGNOSIS — Z803 Family history of malignant neoplasm of breast: Secondary | ICD-10-CM | POA: Diagnosis not present

## 2017-07-29 DIAGNOSIS — D649 Anemia, unspecified: Secondary | ICD-10-CM

## 2017-07-29 DIAGNOSIS — D6481 Anemia due to antineoplastic chemotherapy: Secondary | ICD-10-CM

## 2017-07-29 DIAGNOSIS — C44609 Unspecified malignant neoplasm of skin of left upper limb, including shoulder: Secondary | ICD-10-CM

## 2017-07-29 DIAGNOSIS — D500 Iron deficiency anemia secondary to blood loss (chronic): Secondary | ICD-10-CM

## 2017-07-29 DIAGNOSIS — T451X5A Adverse effect of antineoplastic and immunosuppressive drugs, initial encounter: Principal | ICD-10-CM

## 2017-07-29 DIAGNOSIS — Z8543 Personal history of malignant neoplasm of ovary: Secondary | ICD-10-CM | POA: Diagnosis not present

## 2017-07-29 DIAGNOSIS — I890 Lymphedema, not elsewhere classified: Secondary | ICD-10-CM | POA: Diagnosis not present

## 2017-07-29 DIAGNOSIS — N183 Chronic kidney disease, stage 3 (moderate): Secondary | ICD-10-CM

## 2017-07-29 DIAGNOSIS — D631 Anemia in chronic kidney disease: Secondary | ICD-10-CM

## 2017-07-29 LAB — COMPREHENSIVE METABOLIC PANEL (CC13)
A/G RATIO: 1.3 (ref 1.2–2.2)
ALT: 8 IU/L (ref 0–32)
AST: 11 IU/L (ref 0–40)
Albumin, Serum: 3.6 g/dL (ref 3.5–4.7)
Alkaline Phosphatase, S: 63 IU/L (ref 39–117)
BUN/Creatinine Ratio: 14 (ref 12–28)
BUN: 13 mg/dL (ref 8–27)
Bilirubin Total: 0.8 mg/dL (ref 0.0–1.2)
Calcium, Ser: 9.8 mg/dL (ref 8.7–10.3)
Carbon Dioxide, Total: 32 mmol/L — ABNORMAL HIGH (ref 20–29)
Chloride, Ser: 100 mmol/L (ref 96–106)
Creatinine, Ser: 0.92 mg/dL (ref 0.57–1.00)
GFR, EST AFRICAN AMERICAN: 67 mL/min/{1.73_m2} (ref >59)
GFR, EST NON AFRICAN AMERICAN: 58 mL/min/{1.73_m2} — AB (ref >59)
GLOBULIN, TOTAL: 2.7 g/dL (ref 1.5–4.5)
Glucose: 111 mg/dL — ABNORMAL HIGH (ref 65–99)
POTASSIUM: 3.8 mmol/L (ref 3.5–5.2)
SODIUM: 140 mmol/L (ref 134–144)
TOTAL PROTEIN: 6.3 g/dL (ref 6.0–8.5)

## 2017-07-29 LAB — CBC WITH DIFFERENTIAL (CANCER CENTER ONLY)
BASO#: 0 10*3/uL (ref 0.0–0.2)
BASO%: 0.5 % (ref 0.0–2.0)
EOS ABS: 0.1 10*3/uL (ref 0.0–0.5)
EOS%: 1.8 % (ref 0.0–7.0)
HEMATOCRIT: 30.3 % — AB (ref 34.8–46.6)
HGB: 9.6 g/dL — ABNORMAL LOW (ref 11.6–15.9)
LYMPH#: 0.9 10*3/uL (ref 0.9–3.3)
LYMPH%: 22 % (ref 14.0–48.0)
MCH: 28.9 pg (ref 26.0–34.0)
MCHC: 31.7 g/dL — ABNORMAL LOW (ref 32.0–36.0)
MCV: 91 fL (ref 81–101)
MONO#: 0.5 10*3/uL (ref 0.1–0.9)
MONO%: 11.3 % (ref 0.0–13.0)
NEUT#: 2.6 10*3/uL (ref 1.5–6.5)
NEUT%: 64.4 % (ref 39.6–80.0)
Platelets: 259 10*3/uL (ref 145–400)
RBC: 3.32 10*6/uL — ABNORMAL LOW (ref 3.70–5.32)
RDW: 14.9 % (ref 11.1–15.7)
WBC: 4 10*3/uL (ref 3.9–10.0)

## 2017-07-29 NOTE — Progress Notes (Signed)
Marland Kitchen  HEMATOLOGY ONCOLOGY PROGRESS NOTE  Date of service: .07/29/2017  Patient Care Team: Marletta Lor, MD as PCP - General  CC : f/u for LUE porocarcinoma and h/o  Breast cancer.  SUMMARY OF ONCOLOGIC HISTORY:   Eccrine porocarcinoma of skin   04/01/2012 Initial Diagnosis    Eccrine porocarcinoma of skin       INTERVAL HISTORY:  Patient is here for f/u. She was last seen by Dr Marko Plume on 10/19/2017 for LUE porocarcinoma and previous h/o of breast cancer in 1994 LN+ with LUE lymphedema.  She notes that she is overall doing okay. One of her skin lesions was bleeding but this has now stopped. Notes no new breast lumps or other significant focal symptoms. Follows with Dr Allyson Sabal. No PC/SOB/abd distention/abd pain.  REVIEW OF SYSTEMS:    10 Point review of systems of done and is negative except as noted above.  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 She had re-excisions of all areas (dorsum left hand, 2 on left forearm and left arm) by Dr Ronnette Hila. She had significant lymphedema LUE after those procedures, with history of left breast cancer with 20 node left axillary dissection in 1994 followed by radiation. Repeat scans at [facility] in 05-2012 had no distant disease. With progressive in transit mets, she had hyperthermic limb perfusion by Dr Clovis Riley at [facility] in 06-2012 and excision of an area on dorsum of left hand in Oct 2013. She had significant swelling of LUE after the limb perfusion, which gradually improved, and progression of the in transit mets LUE after the limb perfusion. She saw Dr Turner Daniels at [facility], with recommendation for gemzar/taxotere as systemic sarcoma regimen, chosen in part due to her prior chemotherapy. She had no evidence of disease on CXR at [facility] 11-23-2012. She had single cycle of dose reduced gemcitabine taxotere Feb 2014, tolerated very poorly, with hospitalization  due to pancytopenia despite neulasta, requiring transfusions of PRBCs and platelets, as well as severe diarrhea. PS took several months to improve back to baseline after that attempt at chemotherapy. She prefers no further chemo for the porocarcinoma. She did have good improvement in bothersome tumor nodule on left olecranon process with RT by Dr Tammi Klippel 05-2013, with 40 Gy in 10 fractions. She had additional 40 Gy to symptomatic area of the porocarcinoma 4-20 thru 03-19-14. She had wide excisions of area at left olecranon and upper arm by Dr Clovis Riley 03-12-15.  LEFT BREAST CANCER T3N1 (2 of 20 nodes) ER+ in 1994, treated with mastectomy and axillary node dissection, adriamycin/cytoxan x4, RT by Dr Berton Mount (records not available now), and tamoxifen x 5 years. Last right mammogram was in [facility] 06-26-15, with heterogeneously dense breast tissue, otherwise no radiographic findings of concern. BRCA testing negative x2, tho sister BRCA 2+ and 4 of 5 sisters with breast cancer.  IA CLEAR CELL OVARIAN 2004, treated with surgery followed by 3 cycles of taxol/ carboplatin. She saw Dr Josephina Shih [phone] and will see him yearly. BRCA testing negative x2, tho sister BRCA 2+ and 4 of 5 sisters with breast cancer, no other gyn cancer. . Past Medical History:  Diagnosis Date  . Allergy   . Arthritis   . Breast CA ([place])    NO BP OR STICKS IN LEFT ARM  . CAD (coronary artery disease)   . Cancer ([place])   . Cardiomyopathy ([place])    improved  . Cataract   .  Diverticulosis   . Eccrine porocarcinoma of skin   . History of chemotherapy   . History of radiation therapy 1994   left chest wall  . Hyperlipidemia   . Hypertension   . Osteoporosis   . Ovarian ca ([place])   . Wears dentures    full bottom-partial top  . Wears glasses     . Past Surgical History:  Procedure Laterality Date  . ABDOMINAL HYSTERECTOMY  2004  . BREAST SURGERY    . CHOLECYSTECTOMY  1993  . EYE SURGERY     CATARACTS  .  left breast mastectomy  1994   T3 N1 Mx  . MASS EXCISION Left 05/09/2014   Procedure: EXCISION OF NODULE LEFT CHEST ;  Surgeon: Pedro Earls, MD;  Location: [place];  Service: General;  Laterality: Left;  Marland Kitchen MODIFIED RADICAL MASTECTOMY W/ AXILLARY LYMPH NODE DISSECTION W/ PECTORALIS MINOR EXCISION  1994   Left  . right leg  1990   fx  . SHOULDER ARTHROSCOPY    . [place]   replacement-lt  . SKIN BIOPSY  04/27/2011   left hand, forearm and posterior arm    . Social History  Substance Use Topics  . Smoking status: Never Smoker  . Smokeless tobacco: Never Used  . Alcohol use No    ALLERGIES:  is allergic to ibuprofen and morphine.  MEDICATIONS:  Current Outpatient Prescriptions  Medication Sig Dispense Refill  . aspirin EC 81 MG tablet Take 81 mg by mouth at bedtime.     . calcium carbonate (TUMS - DOSED IN MG ELEMENTAL CALCIUM) 500 MG chewable tablet Chew 2 tablets by mouth daily with supper.    . carvedilol (COREG) 25 MG tablet TAKE 1 TABLET TWICE A DAY WITH FOOD 180 tablet 0  . cephALEXin (KEFLEX) 500 MG capsule Take 1 capsule (500 mg total) by mouth 2 (two) times daily. 20 capsule 0  . Cholecalciferol (VITAMIN D) 2000 UNITS tablet Take 2,000 Units by mouth every morning.     . ciprofloxacin (CIPRO) 250 MG tablet Take 1 tablet (250 mg total) by mouth 2 (two) times daily. For 3 days 6 tablet 0  . Coenzyme Q10 (CO Q 10) 100 MG CAPS Take 1 capsule by mouth every morning.     . denosumab (PROLIA) 60 MG/ML SOLN injection Inject 60 mg into the skin every 6 (six) months. Administer in upper arm, thigh, or abdomen    . ferrous fumarate (HEMOCYTE - 106 MG FE) 325 (106 Fe) MG TABS tablet Take 1 tablet by mouth 2-3 times a week as tolerated. 30 each 1  . fluticasone (FLONASE) 50 MCG/ACT nasal spray SHAKE LIQUID AND USE 2 SPRAYS IN EACH NOSTRIL DAILY 48 g 3  . lisinopril-hydrochlorothiazide (PRINZIDE,ZESTORETIC) 20-12.5 MG tablet Take 1 tablet by mouth  daily. 90 tablet 1  . loperamide (IMODIUM) 2 MG capsule Take 1 capsule (2 mg total) by mouth as needed for diarrhea or loose stools. 20 capsule 0  . ondansetron (ZOFRAN) 4 MG tablet Take 1 tablet (4 mg total) by mouth every 6 (six) hours. 12 tablet 0  . pantoprazole (PROTONIX) 40 MG tablet TAKE 1 TABLET BY MOUTH EVERY DAY 90 tablet 1  . simvastatin (ZOCOR) 40 MG tablet TAKE 1 TABLET AT BEDTIME 90 tablet 0  . traMADol (ULTRAM) 50 MG tablet Take 1 tablet (50 mg total) by mouth every 8 (eight) hours as needed. 30 tablet 0   No current facility-administered medications for this visit.  PHYSICAL EXAMINATION: ECOG PERFORMANCE STATUS: 1 - Symptomatic but completely ambulatory  . Vitals:   07/29/17 1523  BP: (!) 167/63  Pulse: (!) 102  Resp: 18  Temp: 98.1 F (36.7 C)  SpO2: 98%    Filed Weights   07/29/17 1523  Weight: 100 lb (45.4 kg)   .Body mass index is 18.29 kg/m.  GENERAL:alert, in no acute distress and comfortable SKIN: multiple skin lesions from porocarcinoma on the LUE. Does not note significant change in any specific skin lesion. EYES: conjunctiva are pink and non-injected, sclera anicteric OROPHARYNX: MMM, no exudates, no oropharyngeal erythema or ulceration NECK: supple, no JVD LYMPH:  no palpable lymphadenopathy in the cervical, axillary or inguinal regions LUNGS: clear to auscultation b/l with normal respiratory effort HEART: regular rate & rhythm ABDOMEN:  normoactive bowel sounds , non tender, not distended. Extremity: no pedal edema PSYCH: alert & oriented x 3 with fluent speech NEURO: no focal motor/sensory deficits  LABORATORY DATA:   I have reviewed the data as listed  . CBC Latest Ref Rng & Units 07/29/2017 12/07/2016 10/19/2016  WBC 3.9 - 10.0 10e3/uL 4.0 6.7 4.1  Hemoglobin 11.6 - 15.9 g/dL 9.6(L) 9.5(L) 10.4(L)  Hematocrit 34.8 - 46.6 % 30.3(L) 28.0(L) 31.0(L)  Platelets 145 - 400 10e3/uL 259 259.0 151    . CMP Latest Ref Rng & Units 12/07/2016  04/20/2016 12/18/2015  Glucose 70 - 99 mg/dL 101(H) 86 -  BUN 6 - 23 mg/dL 13 24.2 14  Creatinine 0.40 - 1.20 mg/dL 0.77 1.2(H) 1.1  Sodium 135 - 145 mEq/L 134(L) 134(L) 131(A)  Potassium 3.5 - 5.1 mEq/L 3.3(L) 3.8 4.3  Chloride 96 - 112 mEq/L 91(L) - -  CO2 19 - 32 mEq/L 34(H) 30(H) -  Calcium 8.4 - 10.5 mg/dL 9.0 9.3 -  Total Protein 6.0 - 8.3 g/dL 6.0 6.1(L) -  Total Bilirubin 0.2 - 1.2 mg/dL 1.3(H) 1.29(H) -  Alkaline Phos 39 - 117 U/L 62 38(L) 37  AST 0 - 37 U/L 39(H) 15 18  ALT 0 - 35 U/L 30 11 24      RADIOGRAPHIC STUDIES: I have personally reviewed the radiological images as listed and agreed with the findings in the report. No results found.  ASSESSMENT & PLAN:   1.Porocarcinoma LUE with in transit mets: Overall has had relatively indolent disease with no overt progression since her last clinic visit. -no indication for specific therapeutic intervention at this time. -previously palliative treatment (patient did not tolerate attempt at chemo almost 4 years ago, does not want further chemo.  Some excisions and directed radiation have been useful prn. )  2.T3N1 left breast cancer treated with mastectomy and 20 node axillary dissection 1994, RT and chemo, without known active disease.  Right mammogram due 06-2017, dense breast tissue. Plan -MMG with PCP -no clinical evidence of overt breast cancer recurrence at this time.  3.LUE lymphedema related to axillary node dissection and radiation, has worsened from interventions for porocarcinoma on that arm but presently stable.   4.IA ovarian cancer 2004. No known recurrent disease. Followed by Dr Josephina Shih  5.left vocal cord paralysis thought related to previous chest RT with scarring when this was identified in fall 2015. Swallowing study 10-2015 no aspiration, and patient is careful with swallowing.  6.cardiomyopathy 2008. Syncopal episode while driving, echo done and event monitor. Not driving now. No further syncope.  Atherosclerotic disease by CT. Aortic aneurysm 4 cm by CT. 7.osteoporosis with vertebral compression fracture previously. Chronic back pain not related to  malignancy. She dislikes any pain medication, occasionally uses tramadol 8.PAC removed 9. Sister BRCA 2+. Patient does not have that same mutation on testing x2, which may be simply limitation of testing. 4 of 5 sisters with breast cancer 10. Excisional biopsy of left chest nodule by Dr Hassell Done 04-2013, benign  Patient lives in [place] to the [facility] and would like to f/u with Dr Jonette Eva -- referral given.  -Please help setup f/u with Dr Jonette Eva at [facility] in 2-3 months as per patients preference for continue f/u of Breast Cancer/Ovarian cancer and Porocarcinoma.   I spent 30 minutes counseling the patient face to face. The total time spent in the appointment was 40 minutes and more than 50% was on counseling and direct patient cares.    Sullivan Lone MD [place] AAHIVMS [facility] Hematology/Oncology Physician [facility]  (Office):       [phone] (Work cell):  [phone] (Fax):           [phone]

## 2017-07-30 ENCOUNTER — Encounter: Payer: Self-pay | Admitting: Radiation Oncology

## 2017-07-30 NOTE — Progress Notes (Signed)
Hematology and Oncology Follow Up Visit  Judy Herrera [id number] [birth year] 83 y.o. 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 I actually take care of her sister, Judy Herrera who has breast cancer. She is doing well. I see her once year.  Judy Herrera has multiple problems. She had past breast cancer and ovarian cancer. Surprisingly, she is BRCA negative. Her sisters, however, are BRCA positive.  Her real problem right now is this porocarcinoma of the left arm. It looks like she has worsening in-transit metastases from what I can tell. She was last treated back in 2015 with radiation therapy. I will see if she can be seen by radiation oncology again.  She has had no problem with cough. There's no shortness of breath. Per she has marked lymphedema of the left arm. She said this is not causing her any pain.  Is no change in bowel or bladder habits.  She's had no bleeding. She said on occasion, she has some bleeding in the tumors on the left arm.  She's had no leg swelling.  She has had no headache.  She comes in with her son. Overall, I see that her performance status is ECOG 2    Medications:  Current Outpatient Prescriptions:  .  aspirin EC 81 MG tablet, Take 81 mg by mouth at bedtime. , Disp: , Rfl:  .  calcium carbonate (TUMS - DOSED IN MG ELEMENTAL CALCIUM) 500 MG chewable tablet, Chew 2 tablets by mouth daily with supper., Disp: , Rfl:  .  carvedilol (COREG) 25 MG tablet, TAKE 1 TABLET TWICE A DAY WITH FOOD, Disp: 180 tablet, Rfl: 0 .  cephALEXin (KEFLEX) 500 MG capsule, Take 1 capsule (500 mg  total) by mouth 2 (two) times daily., Disp: 20 capsule, Rfl: 0 .  Cholecalciferol (VITAMIN D) 2000 UNITS tablet, Take 2,000 Units by mouth every morning. , Disp: , Rfl:  .  ciprofloxacin (CIPRO) 250 MG tablet, Take 1 tablet (250 mg total) by mouth 2 (two) times daily. For 3 days, Disp: 6 tablet, Rfl: 0 .  Coenzyme Q10 (CO Q 10) 100 MG CAPS, Take 1 capsule by mouth every morning. , Disp: , Rfl:  .  denosumab (PROLIA) 60 MG/ML SOLN injection, Inject 60 mg into the skin every 6 (six) months. Administer in upper arm, thigh, or abdomen, Disp: , Rfl:  .  ferrous fumarate (HEMOCYTE - 106 MG FE) 325 (106 Fe) MG TABS tablet, Take 1 tablet by mouth 2-3 times a week as tolerated., Disp: 30 each, Rfl: 1 .  fluticasone (FLONASE) 50 MCG/ACT nasal spray, SHAKE LIQUID AND USE 2 SPRAYS IN EACH NOSTRIL DAILY, Disp: 48 g, Rfl: 3 .  lisinopril-hydrochlorothiazide (PRINZIDE,ZESTORETIC) 20-12.5 MG tablet, Take 1 tablet by mouth daily., Disp: 90 tablet, Rfl: 1 .  loperamide (IMODIUM) 2 MG capsule, Take 1 capsule (2 mg  total) by mouth as needed for diarrhea or loose stools., Disp: 20 capsule, Rfl: 0 .  ondansetron (ZOFRAN) 4 MG tablet, Take 1 tablet (4 mg total) by mouth every 6 (six) hours., Disp: 12 tablet, Rfl: 0 .  pantoprazole (PROTONIX) 40 MG tablet, TAKE 1 TABLET BY MOUTH EVERY DAY, Disp: 90 tablet, Rfl: 1 .  simvastatin (ZOCOR) 40 MG tablet, TAKE 1 TABLET AT BEDTIME, Disp: 90 tablet, Rfl: 0 .  traMADol (ULTRAM) 50 MG tablet, Take 1 tablet (50 mg total) by mouth every 8 (eight) hours as needed., Disp: 30 tablet, Rfl: 0  Allergies:  Allergies  Allergen Reactions  . Ibuprofen Nausea Only  . Morphine Nausea And Vomiting    Past Medical History, Surgical history, Social history, and Family History were reviewed and updated.  Review of Systems: Review of Systems  Constitutional: Negative for appetite change, fatigue, fever and unexpected weight change.  HENT:   Negative for lump/mass, mouth sores, sore throat  and trouble swallowing.   Respiratory: Negative for cough, hemoptysis and shortness of breath.   Cardiovascular: Negative for leg swelling and palpitations.  Gastrointestinal: Negative for abdominal distention, abdominal pain, blood in stool, constipation, diarrhea, nausea and vomiting.  Genitourinary: Negative for bladder incontinence, dysuria, frequency and hematuria.   Musculoskeletal: Negative for arthralgias, back pain, gait problem and myalgias.  Skin: Negative for itching and rash.  Neurological: Negative for dizziness, extremity weakness, gait problem, headaches, numbness, seizures and speech difficulty.  Hematological: Does not bruise/bleed easily.  Psychiatric/Behavioral: Negative for depression and sleep disturbance. The patient is not nervous/anxious.     Physical Exam:  weight is 100 lb (45.4 kg). Her oral temperature is 98.1 F (36.7 C). Her blood pressure is 167/63 (abnormal) and her pulse is 102 (abnormal). Her respiration is 18 and oxygen saturation is 98%.   Wt Readings from Last 3 Encounters:  07/29/17 100 lb (45.4 kg)  07/14/17 94 lb 9.6 oz (42.9 kg)  07/07/17 97 lb 1.6 oz (44 kg)    Physical Exam  Constitutional: She is oriented to person, place, and time.  HENT:  Head: Normocephalic and atraumatic.  Mouth/Throat: Oropharynx is clear and moist.  Eyes: Pupils are equal, round, and reactive to light. EOM are normal.  Neck: Normal range of motion.  Cardiovascular: Normal rate, regular rhythm and normal heart sounds.   Pulmonary/Chest: Effort normal and breath sounds normal.  Abdominal: Soft. Bowel sounds are normal.  Musculoskeletal: Normal range of motion. She exhibits no edema, tenderness or deformity.  She has marked lymphedema of the left arm. There is some slight erythema associated with the left arm. She has no tenderness to palpation. She has multiple subcutaneous nodules on the left arm consistent with her porocarcinoma in-transit metastasis    Lymphadenopathy:    She has no cervical adenopathy.  Neurological: She is alert and oriented to person, place, and time.  Skin: Skin is warm and dry. No rash noted. No erythema.  Psychiatric: She has a normal mood and affect. Her behavior is normal. Judgment and thought content normal.  Vitals reviewed.    Lab Results  Component Value Date   WBC 4.0 07/29/2017   HGB 9.6 (L) 07/29/2017   HCT 30.3 (L) 07/29/2017   MCV 91 07/29/2017   PLT 259 07/29/2017     Chemistry      Component Value Date/Time   NA 140 07/29/2017 1436   NA 134 (L) 04/20/2016 0819   K 3.8 07/29/2017 1436   K 3.8 04/20/2016 1478  CL 100 07/29/2017 1436   CL 101 05/02/2013 0847   CO2 32 (H) 07/29/2017 1436   CO2 30 (H) 04/20/2016 0819   BUN 13 07/29/2017 1436   BUN 24.2 04/20/2016 0819   CREATININE 0.92 07/29/2017 1436   CREATININE 1.2 (H) 04/20/2016 0819   GLU 83 12/18/2015      Component Value Date/Time   CALCIUM 9.8 07/29/2017 1436   CALCIUM 9.3 04/20/2016 0819   ALKPHOS 63 07/29/2017 1436   ALKPHOS 38 (L) 04/20/2016 0819   AST 11 07/29/2017 1436   AST 15 04/20/2016 0819   ALT 8 07/29/2017 1436   ALT 11 04/20/2016 0819   BILITOT 0.8 07/29/2017 1436   BILITOT 1.29 (H) 04/20/2016 0819         Impression and Plan: Ms. Hamidi is an 83 year old white female with multiple malignancies. The only active malignancy now is this porocarcinoma.  I am somewhat worried about her anemia. I think that with her past treatments, she could certainly have myelo dysplasia. She is taking oral iron. I think that this is really not helping. As such, I think we can probably stop this.  I looked at her blood on the microscope. I do not see anything that looked suspicious. I did not see any nucleated red blood cells. I saw no teardrop cells. There are no immature myeloid or lymphoid cells.  I do have to worry about myelodysplasia. However, I would try to avoid having to do a bone marrow test.  We probably will  need to get an erythropoietin level on her.  I would like to see her back in a month.  I will see about radiation oncology to evaluate her for the possibility of some radiation to the left arm. I'm not sure she would be considered a surgical candidate because of the lymphedema and the difficulty with respect to healing from any kind of surgery.  It was fun seeing her. I am somewhat surprised that she is not BRCA positive. One would have to suspect that she may have some other mutation that we have just not been able to identify.  I spent about 45 minutes with she and her son. I answered all their questions. I explained to them my concern regarding her anemia. They understand.   Volanda Napoleon, MD 9/14/20187:14 AM

## 2017-08-11 ENCOUNTER — Ambulatory Visit: Payer: Medicare HMO | Admitting: Urology

## 2017-08-11 ENCOUNTER — Ambulatory Visit: Payer: Medicare HMO

## 2017-08-18 DIAGNOSIS — M810 Age-related osteoporosis without current pathological fracture: Secondary | ICD-10-CM | POA: Diagnosis not present

## 2017-08-18 NOTE — Progress Notes (Signed)
Histology and Location of Primary Skin Cancer: LUE porocarcinoma   Judy Herrera presented with the following signs/symptoms, months ago: Porocarcinoma of the left arm worsening in-transit metastases with marked lymphedema of the left arm, occasional bleeding in tumors on left arm,some slight erythema,multiple subcutaneous nodules  Past/Anticipated interventions by patient's surgeon/dermatologist for current problematic lesion, if any: 08-19-17 Radiation referral,Radiation 2015 and chemotherapy used in the past,re-excision of areas in 2012  Past skin cancers, if any:  1) Location/Histology/Intervention: LUE  2) Location/Histology/Intervention: LUE  3) Location/Histology/Intervention:   History of Blistering sunburns, if [name]:[id number] has several lesion on her left arm. Pain:Denies any pain  SAFETY ISSUES:  Prior radiation? Yes 03-05-14-03-19-14 LUE,05-29-13-06-12-13 LUE, Left breast radiation 1994  Pacemaker/ICD? :No  Possible current pregnancy? :No  Is the patient on methotrexate? No  Current Complaints / other details: of the left spring 2012 by Dr.Lupton. She had re-excisions of all areas (dorsum left hand, 2 on left forearm and left arm) by Dr Ronnette Hila. previous treated LUE porocarcinoma. Multiple transit lesions of porocarcinoma from left hand to upper arm.  She has limited ROM of LUE related to old shoulder injury. [name] explains that Dr. Allyson Sabal has removed several lesions of from her left arm and anterior neck  in the past.  Patient's voice is hoarse. Patient explains that she understands from Dr. Lucia Gaskins her left vocal cord "isn't working but, they don't know why."  Vitals:   08/20/17 0950  BP: (!) 153/75  Pulse: (!) 107  Resp: 20  Temp: 97.9 F (36.6 C)  TempSrc: Oral  SpO2: 100%  Weight: 99 lb (44.9 kg)   Wt Readings from Last 3 Encounters:  08/20/17 99 lb (44.9 kg)  07/29/17 100 lb (45.4 kg)  07/14/17 94 lb 9.6 oz (42.9 kg)

## 2017-08-20 ENCOUNTER — Encounter: Payer: Self-pay | Admitting: Urology

## 2017-08-20 ENCOUNTER — Ambulatory Visit
Admission: RE | Admit: 2017-08-20 | Discharge: 2017-08-20 | Disposition: A | Discharge status: Home / Self Care | Payer: Medicare HMO | Source: Ambulatory Visit | Attending: Urology | Admitting: Urology

## 2017-08-20 ENCOUNTER — Ambulatory Visit
Admission: RE | Admit: 2017-08-20 | Discharge: 2017-08-20 | Disposition: A | Discharge status: Home / Self Care | Payer: Medicare HMO | Source: Ambulatory Visit | Attending: Radiation Oncology | Admitting: Radiation Oncology

## 2017-08-20 VITALS — BP 153/75 | HR 107 | Temp 97.9°F | Resp 20 | Wt 99.0 lb

## 2017-08-20 DIAGNOSIS — Z79899 Other long term (current) drug therapy: Secondary | ICD-10-CM | POA: Insufficient documentation

## 2017-08-20 DIAGNOSIS — Z886 Allergy status to analgesic agent status: Secondary | ICD-10-CM | POA: Diagnosis not present

## 2017-08-20 DIAGNOSIS — C4499 Other specified malignant neoplasm of skin, unspecified: Secondary | ICD-10-CM

## 2017-08-20 DIAGNOSIS — Z9012 Acquired absence of left breast and nipple: Secondary | ICD-10-CM | POA: Insufficient documentation

## 2017-08-20 DIAGNOSIS — Z9221 Personal history of antineoplastic chemotherapy: Secondary | ICD-10-CM | POA: Insufficient documentation

## 2017-08-20 DIAGNOSIS — Z923 Personal history of irradiation: Secondary | ICD-10-CM | POA: Diagnosis not present

## 2017-08-20 DIAGNOSIS — Z853 Personal history of malignant neoplasm of breast: Secondary | ICD-10-CM | POA: Diagnosis not present

## 2017-08-20 DIAGNOSIS — Z885 Allergy status to narcotic agent status: Secondary | ICD-10-CM | POA: Insufficient documentation

## 2017-08-20 DIAGNOSIS — Z7982 Long term (current) use of aspirin: Secondary | ICD-10-CM | POA: Diagnosis not present

## 2017-08-20 DIAGNOSIS — I890 Lymphedema, not elsewhere classified: Secondary | ICD-10-CM | POA: Diagnosis not present

## 2017-08-20 DIAGNOSIS — Z510 Encounter for antineoplastic radiation therapy: Secondary | ICD-10-CM | POA: Diagnosis not present

## 2017-08-20 DIAGNOSIS — C44699 Other specified malignant neoplasm of skin of left upper limb, including shoulder: Secondary | ICD-10-CM | POA: Diagnosis not present

## 2017-08-20 NOTE — Progress Notes (Signed)
Radiation Oncology         [phone]) [phone] ________________________________  Name: Judy Herrera MRN: [id number]  Date: 01/09/2015  DOB: July 25, 1931  Outpatient Re-Consultation Visit  CC: Judy Cowden, MD  Judy Levan, MD  Diagnosis:   83 year old woman with porocarcinoma involving the left upper extremity.     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  Narrative:  The patient is seen today at the request of Dr. Marin Herrera for consideration of further radiotherapy to the left forearm for treatment of her porocarcinoma.  She is s/p wide excision of five lesions (four excisions) by Dr. Clovis Herrera and a hyperthermic limb perfusion of her left upper extremity  in conjunction with Dr. Oletta Herrera and the vascular surgery team. The margins of all these sites have been micorscopically positive.     In Summary, she was diagnosed with POROCARCINOMA involving 3 areas left hand and arm in spring 2012 by JudyLupton. She had re-excisions of all areas (dorsum left hand, 2 on left forearm and left arm) by Dr Judy Herrera. She had significant lymphedema LUE after those procedures, with history of left breast cancer with 20 node left axillary dissection in 1994 followed by radiation. Repeat scans at [facility] in 05-2012 had no distant disease. With progressive in transit mets, she had hyperthermic limb perfusion by Dr Judy Herrera at [facility] in 06-2012 and excision of an area on dorsum of left hand in Oct 2013. She had significant swelling of LUE after the limb perfusion, which gradually improved, and progression of the in transit mets LUE after the limb perfusion. She saw Dr Judy Herrera at [facility],  with recommendation for gemzar/taxotere as systemic sarcoma regimen, chosen in part due to her prior chemotherapy. She had no evidence of disease on CXR at [facility] 11-23-2012. She had single cycle of dose reduced gemcitabine taxotere Feb 2014, tolerated very poorly, with hospitalization due to pancytopenia despite neulasta, requiring transfusions of PRBCs and platelets, as well as severe diarrhea. PS took several months to improve back to baseline after that attempt at chemotherapy. She prefers no further chemo for the porocarcinoma. She did have good improvement in bothersome tumor nodule on left olecranon process with RT by Dr Judy Herrera 05-2013, with 40 Gy in 10 fractions. She had additional 40 Gy to symptomatic area of the porocarcinoma 4-20 thru 03-19-14. Most recently she had WLE of two large lesions on her posterior forearm and anterior bicep on 03/12/2015 with Dr. Clovis Herrera at [facility].   LEFT BREAST CANCER T3N1 (2 of 20 nodes) ER+ in 1994, treated with mastectomy and axillary node dissection, adriamycin/cytoxan x4, RT by Dr Judy Herrera (records not available now), and tamoxifen x 5 years. Last right mammogram was in [facility] 06-26-15, with heterogeneously dense breast tissue, otherwise no radiographic findings of concern. BRCA testing negative x2, tho sister BRCA 2+ and 4 of 5 sisters with breast cancer.  IA CLEAR CELL OVARIAN 2004, treated with surgery followed by 3 cycles of taxol/ carboplatin. She saw Dr Judy Herrera [phone] and will see him yearly. BRCA testing negative x2, tho sister BRCA 2+ and 4 of 5 sisters with breast cancer, no other gyn cancer.  She presents  today with her son, Judy Herrera, for further evaluation and consideration of radiotherapy in the management of her disease.                          ALLERGIES:  is allergic to ibuprofen and morphine.  Meds: Current Outpatient Prescriptions  Medication Sig Dispense Refill  . aspirin EC 81 MG tablet Take 81 mg by mouth at bedtime.     .  Calcium Carbonate-Vitamin D (CALCIUM + D PO) Take 600 mg by mouth daily after lunch.     . carvedilol (COREG) 25 MG tablet TAKE 1 TABLET 2 TIMES A DAY WITH FOOD. 180 tablet 1  . Cholecalciferol (VITAMIN D) 2000 UNITS tablet Take 2,000 Units by mouth daily.    . Coenzyme Q10 (CO Q 10) 100 MG CAPS Take 1 capsule by mouth daily.    Marland Kitchen denosumab (PROLIA) 60 MG/ML SOLN injection Inject 60 mg into the skin every 6 (six) months. Administer in upper arm, thigh, or abdomen    . ferrous gluconate (FERGON) 324 MG tablet Take 324 mg by mouth once a week. Takes 2 tabs on empty stomach with OJ on Monday.    . fluticasone (FLONASE) 50 MCG/ACT nasal spray Place 2 sprays into both nostrils daily. 16 g 6  . lisinopril (PRINIVIL,ZESTRIL) 20 MG tablet TAKE 1 TABLET AT BEDTIME. 90 tablet 1  . loperamide (IMODIUM) 2 MG capsule Take 1 capsule (2 mg total) by mouth as needed for diarrhea or loose stools. 20 capsule 0  . mupirocin ointment (BACTROBAN) 2 % Apply 1 application topically 2 (two) times daily. To left elbow 7-10 days 22 g 0  . simvastatin (ZOCOR) 40 MG tablet TAKE 1 TABLET AT BEDTIME. 90 tablet 2  . traMADol (ULTRAM) 50 MG tablet Take 1 tablet (50 mg total) by mouth every 6 (six) hours as needed. for pain 60 tablet 5   No current facility-administered medications for this encounter.    Physical Findings: Vitals:   08/20/17 0950  BP: (!) 153/75  Pulse: (!) 107  Resp: 20  Temp: 97.9 F (36.6 C)  SpO2: 100%  In general this is a well appearing elderly caucasian female in no acute distress. She's alert and oriented x4 and appropriate throughout the examination. Cardiopulmonary assessment is negative for acute distress and she exhibits normal effort.   She has marked lymphedema of the left arm with some slight erythema.  This is non tender to palpation and sensation is intact.  She has FROM of the left upper extremity. She has numerous subcutaneous nodules on the left arm consistent with her porocarcinoma  in-transit metastasis. There is a 3 cm lesion in the mid left forearm that appears friable.  There is no active bleeding or oozing at this time.         Lab Findings: Lab Results  Component Value Date   WBC 3.9 12/24/2014   WBC 4.2 08/16/2014   HGB 11.1* 12/24/2014   HGB 11.1* 08/16/2014   HCT 33.2* 12/24/2014   HCT 33.4* 08/16/2014   PLT 146 12/24/2014   PLT 158.0 08/16/2014    Lab Results  Component Value Date   NA 140 12/24/2014   NA 137 08/16/2014   K 4.2 12/24/2014   K 4.2 08/16/2014   CHLORIDE 104 12/24/2014   CO2 26 12/24/2014   CO2 35* 08/16/2014   GLUCOSE 93 12/24/2014   GLUCOSE 95 08/16/2014   GLUCOSE 98 05/02/2013   BUN 19.0 12/24/2014  BUN 15 08/16/2014   CREATININE 0.9 12/24/2014   CREATININE 1.0 08/16/2014   CREATININE 0.98 05/17/2012   BILITOT 1.10 12/24/2014   BILITOT 1.1 08/16/2014   ALKPHOS 46 12/24/2014   ALKPHOS 44 08/16/2014   AST 22 12/24/2014   AST 22 08/16/2014   ALT 21 12/24/2014   ALT 14 08/16/2014   PROT 5.9* 12/24/2014   PROT 7.0 08/16/2014   ALBUMIN 3.6 12/24/2014   ALBUMIN 4.1 08/16/2014   CALCIUM 8.8 12/24/2014   CALCIUM 9.3 08/16/2014   ANIONGAP 9 12/24/2014    Radiographic Findings: Ct Chest W Contrast  01/07/2015   CLINICAL DATA:  Unilateral vocal cord paralysis. Hoarseness. Left-sided. Breast cancer diagnosed in 1994 with chemotherapy and radiation therapy complete. Ovarian cancer in 2004. Chemotherapy completing. Hysterectomy. Left mastectomy.  EXAM: CT CHEST WITH CONTRAST  TECHNIQUE: Multidetector CT imaging of the chest was performed during intravenous contrast administration.  CONTRAST:  18m OMNIPAQUE IOHEXOL 300 MG/ML  SOLN  COMPARISON:  05/02/2013.  Neck CT of 10/19/2014.  FINDINGS: Mediastinum/Nodes: No supraclavicular adenopathy. Beam hardening artifact from left shoulder arthroplasty degrades evaluation of the upper chest. A right Port-A-Cath terminates at the low SVC. Left mastectomy. Left axillary node dissection.  No axillary adenopathy.  Aortic and branch vessel atherosclerosis. Normal heart size, normal heart size. Trace anterior pericardial fluid or thickening, similar. Multivessel coronary artery atherosclerosis. No central pulmonary embolism, on this non-dedicated study. No mediastinal or hilar adenopathy. No internal mammary adenopathy.  Lungs/Pleura: Trace left pleural fluid or thickening, similar. 3 mm central right lower lobe pulmonary nodule on image 45 is similar to on image 45 of the prior exam, strongly suggesting a benign etiology. Similar appearance of volume loss and traction bronchiectasis in the medial left lung apex and left upper lobe. Example image 14 of series 5. There is also minimal subpleural radiation fibrosis in the anterior left upper lobe.  Upper abdomen: Focal steatosis adjacent the falciform ligament. splenule. Normal imaged portions of the stomach, pancreas. Scarred kidneys. Cholecystectomy.  Musculoskeletal: Severe compression deformity at T12 with focal kyphosis. This is similar. Similar ventral canal encroachment. Similar sclerosis involving the lateral right fifth rib. Favored to represent a bone island.  IMPRESSION: 1. Similar appearance of radiation fibrosis in the medial left apex and upper lobe. No other explanation for unilateral vocal cord paralysis. 2. Left mastectomy, without metastatic disease. 3. A right lower lobe pulmonary nodule is similar to 05/02/2013, strongly favoring a benign etiology. 4. Chronic T12 compression deformity, severe, with ventral canal encroachment.   Electronically Signed   By: KAbigail MiyamotoM.D.   On: 01/07/2015 08:55    Impression/Plan:  83year old woman with porocarcinoma involving the left upper extremity.  Today, we talked to the patient and her son about the findings and workup thus far. We discussed the natural history of porocarcinoma and general treatment, highlighting the role of radiotherapy in the management. The patient has developed  increasing size and overlying skin breakdown of a left-sided cutaneous forearm nodule. We shared our concerns with the patient and her son about repeating radiotherapy to this area given the risk for chronic wound. However, we do not suspect that the patient is an ideal candidate for surgical resection at her advanced age and given the depth of invasion of this lesion, and therefore feel it is reasonable to proceed with palliative radiotherapy to the left forearm lesion in an attempt to stop the tumor growth and prevent erosion through the skin which could be quite problematic. We discussed the  available radiation techniques, and focused on the details of logistics and delivery.The recommendation is to proceed with a 2 week course of radiotherapy in 10 fractions to a total dose of 30 Gy.   We reviewed the anticipated acute and late sequelae associated with radiation in this setting. The patient was encouraged to ask questions which were answered to the best of our ability and to her satisfaction.  At the end of our conversation, the patient elects to proceed with palliative radiotherapy to the left forearm lesion and is scheduled for CT SIM on 08/26/17 at 1pm in anticipation of beginning treatments the following week.   Nicholos Johns, PA-C    Tyler Pita, MD  [facility] Direct Dial: [phone]  Fax: [phone] [email or web address]  Skype  LinkedIn

## 2017-08-25 ENCOUNTER — Telehealth: Payer: Self-pay | Admitting: Hematology

## 2017-08-25 ENCOUNTER — Ambulatory Visit
Admission: RE | Admit: 2017-08-25 | Discharge: 2017-08-25 | Disposition: A | Discharge status: Home / Self Care | Payer: Medicare HMO | Source: Ambulatory Visit | Attending: Radiation Oncology | Admitting: Radiation Oncology

## 2017-08-25 NOTE — Telephone Encounter (Signed)
Judy Herrera called in with concerns about a radiation appointment tomorrow. Someone from Radiation will call him to discuss that appoinment

## 2017-08-26 ENCOUNTER — Ambulatory Visit
Admission: RE | Admit: 2017-08-26 | Discharge: 2017-08-26 | Disposition: A | Discharge status: Home / Self Care | Payer: Medicare HMO | Source: Ambulatory Visit | Attending: Radiation Oncology | Admitting: Radiation Oncology

## 2017-08-26 DIAGNOSIS — Z923 Personal history of irradiation: Secondary | ICD-10-CM | POA: Diagnosis not present

## 2017-08-26 DIAGNOSIS — Z7982 Long term (current) use of aspirin: Secondary | ICD-10-CM | POA: Diagnosis not present

## 2017-08-26 DIAGNOSIS — C4499 Other specified malignant neoplasm of skin, unspecified: Secondary | ICD-10-CM

## 2017-08-26 DIAGNOSIS — Z853 Personal history of malignant neoplasm of breast: Secondary | ICD-10-CM | POA: Diagnosis not present

## 2017-08-26 DIAGNOSIS — Z9012 Acquired absence of left breast and nipple: Secondary | ICD-10-CM | POA: Diagnosis not present

## 2017-08-26 DIAGNOSIS — C44699 Other specified malignant neoplasm of skin of left upper limb, including shoulder: Secondary | ICD-10-CM | POA: Diagnosis not present

## 2017-08-26 DIAGNOSIS — I890 Lymphedema, not elsewhere classified: Secondary | ICD-10-CM | POA: Diagnosis not present

## 2017-08-26 DIAGNOSIS — Z9221 Personal history of antineoplastic chemotherapy: Secondary | ICD-10-CM | POA: Diagnosis not present

## 2017-08-26 DIAGNOSIS — Z79899 Other long term (current) drug therapy: Secondary | ICD-10-CM | POA: Diagnosis not present

## 2017-08-26 DIAGNOSIS — Z510 Encounter for antineoplastic radiation therapy: Secondary | ICD-10-CM | POA: Diagnosis not present

## 2017-08-26 DIAGNOSIS — Z886 Allergy status to analgesic agent status: Secondary | ICD-10-CM | POA: Diagnosis not present

## 2017-08-26 NOTE — Progress Notes (Signed)
  Radiation Oncology         [phone] ________________________________  Name: Judy Herrera MRN: [id number]  Date: 08/26/2017  DOB: 1931/07/13  SIMULATION AND TREATMENT PLANNING NOTE    ICD-10-CM   1. Eccrine porocarcinoma of skin C44.99     DIAGNOSIS:  83 y.o. woman with porocarcinoma involving the left upper extremity  NARRATIVE:  The patient was brought to the [facility].  Identity was confirmed.  All relevant records and images related to the planned course of therapy were reviewed.  The patient freely provided informed written consent to proceed with treatment after reviewing the details related to the planned course of therapy. The consent form was witnessed and verified by the simulation staff.  Then, the patient was set-up in a stable reproducible  supine position for radiation therapy.  CT images were obtained.  Surface markings were placed.  The CT images were loaded into the planning software.  Then the target and avoidance structures were contoured.  Treatment planning then occurred.  The radiation prescription was entered and confirmed.  Then, I designed and supervised the construction of a total of 2 medically necessary complex treatment devices accuform arm positoner and cerrobend electron block.  I have requested : isodose plan for electrons  PLAN:  The patient will receive 40 Gy in 10 fractions.  ________________________________  Sheral Apley Tammi Klippel, M.D.  This document serves as a record of services personally performed by Tyler Pita MD. It was created on his behalf by Delton Coombes, a trained medical scribe. The creation of this record is based on the scribe's personal observations and the provider's statements to them. This document has been checked and approved by the attending provider.

## 2017-08-30 ENCOUNTER — Ambulatory Visit: Payer: Medicare HMO | Admitting: Radiation Oncology

## 2017-09-01 ENCOUNTER — Ambulatory Visit
Admission: RE | Admit: 2017-09-01 | Discharge: 2017-09-01 | Disposition: A | Discharge status: Home / Self Care | Payer: Medicare HMO | Source: Ambulatory Visit | Attending: Radiation Oncology | Admitting: Radiation Oncology

## 2017-09-01 DIAGNOSIS — Z923 Personal history of irradiation: Secondary | ICD-10-CM | POA: Diagnosis not present

## 2017-09-01 DIAGNOSIS — Z853 Personal history of malignant neoplasm of breast: Secondary | ICD-10-CM | POA: Diagnosis not present

## 2017-09-01 DIAGNOSIS — Z7982 Long term (current) use of aspirin: Secondary | ICD-10-CM | POA: Diagnosis not present

## 2017-09-01 DIAGNOSIS — Z9221 Personal history of antineoplastic chemotherapy: Secondary | ICD-10-CM | POA: Diagnosis not present

## 2017-09-01 DIAGNOSIS — Z9012 Acquired absence of left breast and nipple: Secondary | ICD-10-CM | POA: Diagnosis not present

## 2017-09-01 DIAGNOSIS — Z510 Encounter for antineoplastic radiation therapy: Secondary | ICD-10-CM | POA: Diagnosis not present

## 2017-09-01 DIAGNOSIS — I890 Lymphedema, not elsewhere classified: Secondary | ICD-10-CM | POA: Diagnosis not present

## 2017-09-01 DIAGNOSIS — C44699 Other specified malignant neoplasm of skin of left upper limb, including shoulder: Secondary | ICD-10-CM | POA: Diagnosis not present

## 2017-09-01 DIAGNOSIS — Z886 Allergy status to analgesic agent status: Secondary | ICD-10-CM | POA: Diagnosis not present

## 2017-09-01 DIAGNOSIS — Z79899 Other long term (current) drug therapy: Secondary | ICD-10-CM | POA: Diagnosis not present

## 2017-09-02 ENCOUNTER — Telehealth: Payer: Self-pay | Admitting: *Deleted

## 2017-09-02 ENCOUNTER — Ambulatory Visit: Payer: Medicare HMO | Admitting: Radiation Oncology

## 2017-09-02 ENCOUNTER — Ambulatory Visit: Admission: RE | Admit: 2017-09-02 | Payer: Medicare HMO | Source: Ambulatory Visit | Admitting: Radiation Oncology

## 2017-09-02 ENCOUNTER — Ambulatory Visit (HOSPITAL_BASED_OUTPATIENT_CLINIC_OR_DEPARTMENT_OTHER): Payer: Medicare HMO | Admitting: Hematology & Oncology

## 2017-09-02 ENCOUNTER — Ambulatory Visit
Admission: RE | Admit: 2017-09-02 | Discharge: 2017-09-02 | Disposition: A | Discharge status: Home / Self Care | Payer: Medicare HMO | Source: Ambulatory Visit | Attending: Radiation Oncology | Admitting: Radiation Oncology

## 2017-09-02 ENCOUNTER — Other Ambulatory Visit (HOSPITAL_BASED_OUTPATIENT_CLINIC_OR_DEPARTMENT_OTHER): Payer: Medicare HMO

## 2017-09-02 VITALS — BP 140/54 | HR 95 | Temp 97.9°F | Resp 19 | Wt 96.0 lb

## 2017-09-02 DIAGNOSIS — R64 Cachexia: Secondary | ICD-10-CM

## 2017-09-02 DIAGNOSIS — D6481 Anemia due to antineoplastic chemotherapy: Secondary | ICD-10-CM

## 2017-09-02 DIAGNOSIS — C44609 Unspecified malignant neoplasm of skin of left upper limb, including shoulder: Secondary | ICD-10-CM

## 2017-09-02 DIAGNOSIS — D500 Iron deficiency anemia secondary to blood loss (chronic): Secondary | ICD-10-CM | POA: Diagnosis not present

## 2017-09-02 DIAGNOSIS — C4499 Other specified malignant neoplasm of skin, unspecified: Secondary | ICD-10-CM

## 2017-09-02 DIAGNOSIS — Z7982 Long term (current) use of aspirin: Secondary | ICD-10-CM | POA: Diagnosis not present

## 2017-09-02 DIAGNOSIS — Z886 Allergy status to analgesic agent status: Secondary | ICD-10-CM | POA: Diagnosis not present

## 2017-09-02 DIAGNOSIS — Z8543 Personal history of malignant neoplasm of ovary: Secondary | ICD-10-CM

## 2017-09-02 DIAGNOSIS — Z853 Personal history of malignant neoplasm of breast: Secondary | ICD-10-CM | POA: Diagnosis not present

## 2017-09-02 DIAGNOSIS — I890 Lymphedema, not elsewhere classified: Secondary | ICD-10-CM | POA: Diagnosis not present

## 2017-09-02 DIAGNOSIS — Z923 Personal history of irradiation: Secondary | ICD-10-CM | POA: Diagnosis not present

## 2017-09-02 DIAGNOSIS — R634 Abnormal weight loss: Secondary | ICD-10-CM

## 2017-09-02 DIAGNOSIS — N183 Chronic kidney disease, stage 3 (moderate): Secondary | ICD-10-CM

## 2017-09-02 DIAGNOSIS — Z79899 Other long term (current) drug therapy: Secondary | ICD-10-CM | POA: Diagnosis not present

## 2017-09-02 DIAGNOSIS — Z23 Encounter for immunization: Secondary | ICD-10-CM

## 2017-09-02 DIAGNOSIS — Z510 Encounter for antineoplastic radiation therapy: Secondary | ICD-10-CM | POA: Diagnosis not present

## 2017-09-02 DIAGNOSIS — D649 Anemia, unspecified: Secondary | ICD-10-CM

## 2017-09-02 DIAGNOSIS — C44699 Other specified malignant neoplasm of skin of left upper limb, including shoulder: Secondary | ICD-10-CM | POA: Diagnosis not present

## 2017-09-02 DIAGNOSIS — D631 Anemia in chronic kidney disease: Secondary | ICD-10-CM

## 2017-09-02 DIAGNOSIS — Z299 Encounter for prophylactic measures, unspecified: Secondary | ICD-10-CM

## 2017-09-02 DIAGNOSIS — T451X5A Adverse effect of antineoplastic and immunosuppressive drugs, initial encounter: Secondary | ICD-10-CM | POA: Diagnosis not present

## 2017-09-02 DIAGNOSIS — Z9221 Personal history of antineoplastic chemotherapy: Secondary | ICD-10-CM | POA: Diagnosis not present

## 2017-09-02 DIAGNOSIS — K909 Intestinal malabsorption, unspecified: Secondary | ICD-10-CM

## 2017-09-02 DIAGNOSIS — Z9012 Acquired absence of left breast and nipple: Secondary | ICD-10-CM | POA: Diagnosis not present

## 2017-09-02 LAB — CMP (CANCER CENTER ONLY)
ALK PHOS: 65 U/L (ref 26–84)
ALT: 16 U/L (ref 10–47)
AST: 20 U/L (ref 11–38)
Albumin: 3 g/dL — ABNORMAL LOW (ref 3.3–5.5)
BILIRUBIN TOTAL: 1.3 mg/dL (ref 0.20–1.60)
BUN: 13 mg/dL (ref 7–22)
CO2: 31 mEq/L (ref 18–33)
Calcium: 8.7 mg/dL (ref 8.0–10.3)
Chloride: 99 mEq/L (ref 98–108)
Creat: 1 mg/dl (ref 0.6–1.2)
Glucose, Bld: 132 mg/dL — ABNORMAL HIGH (ref 73–118)
POTASSIUM: 3 meq/L — AB (ref 3.3–4.7)
Sodium: 135 mEq/L (ref 128–145)
Total Protein: 6.3 g/dL — ABNORMAL LOW (ref 6.4–8.1)

## 2017-09-02 LAB — CBC WITH DIFFERENTIAL (CANCER CENTER ONLY)
BASO#: 0 10*3/uL (ref 0.0–0.2)
BASO%: 0.6 % (ref 0.0–2.0)
EOS%: 0.9 % (ref 0.0–7.0)
Eosinophils Absolute: 0 10*3/uL (ref 0.0–0.5)
HEMATOCRIT: 30.4 % — AB (ref 34.8–46.6)
HGB: 9.6 g/dL — ABNORMAL LOW (ref 11.6–15.9)
LYMPH#: 0.3 10*3/uL — AB (ref 0.9–3.3)
LYMPH%: 9.2 % — AB (ref 14.0–48.0)
MCH: 28.2 pg (ref 26.0–34.0)
MCHC: 31.6 g/dL — ABNORMAL LOW (ref 32.0–36.0)
MCV: 89 fL (ref 81–101)
MONO#: 0.2 10*3/uL (ref 0.1–0.9)
MONO%: 7 % (ref 0.0–13.0)
NEUT#: 2.7 10*3/uL (ref 1.5–6.5)
NEUT%: 82.3 % — ABNORMAL HIGH (ref 39.6–80.0)
PLATELETS: 212 10*3/uL (ref 145–400)
RBC: 3.4 10*6/uL — ABNORMAL LOW (ref 3.70–5.32)
RDW: 15.5 % (ref 11.1–15.7)
WBC: 3.3 10*3/uL — AB (ref 3.9–10.0)

## 2017-09-02 MED ORDER — MEGESTROL ACETATE 625 MG/5ML PO SUSP: 625.0000 mg | Freq: Every day | ORAL | 4 refills | Status: AC

## 2017-09-02 MED ORDER — INFLUENZA VAC SPLIT QUAD 0.5 ML IM SUSY
0.5000 mL | PREFILLED_SYRINGE | Freq: Once | INTRAMUSCULAR | Status: AC
  Administered 2017-09-02: 0.5 mL via INTRAMUSCULAR

## 2017-09-02 MED ORDER — INFLUENZA VAC SPLIT QUAD 0.5 ML IM SUSY
PREFILLED_SYRINGE | INTRAMUSCULAR | Status: AC
  Filled 2017-09-02: qty 0.5

## 2017-09-02 NOTE — Progress Notes (Signed)
Hematology and Oncology Follow Up Visit  Judy Herrera [id number] [birth year] 83 y.o. 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 She is losing some weight. She comes in with her son. He says that she is eating but not a lot. She is really not that hungry. She has lost 4 pounds since her last office visit which was a month ago.  I talked to her about this. I think that we should try Megace ES. If this does not work, I told her that we could try Marinol-which is marijuana in a pill.  She is started radiation therapy to the left arm. She started this yesterday. She is doing well so far.  She's had no pain in the left arm. She's had no bleeding from the tumors on the left arm.  She's had no problems with bowels or bladder. She's had no nausea or vomiting. There's been no cough. She's had no fever. She's had no rashes.  She has chronic lymphedema of the left arm. This is no worse.   Currently, her performance status is ECOG 2-3.  Medications:  Current Outpatient Prescriptions:  .  aspirin EC 81 MG tablet, Take 81 mg by mouth at bedtime. , Disp: , Rfl:  .  calcium carbonate (TUMS - DOSED IN MG ELEMENTAL CALCIUM) 500 MG chewable tablet, Chew 2 tablets by mouth daily with supper., Disp: , Rfl:  .  carvedilol (COREG) 25 MG tablet, TAKE 1 TABLET TWICE A DAY WITH FOOD, Disp: 180 tablet, Rfl: 0 .  Cholecalciferol (VITAMIN D) 2000 UNITS tablet, Take 2,000 Units by mouth every morning. , Disp: , Rfl:  .  Coenzyme Q10 (CO Q 10) 100 MG  CAPS, Take 1 capsule by mouth every morning. , Disp: , Rfl:  .  denosumab (PROLIA) 60 MG/ML SOLN injection, Inject 60 mg into the skin every 6 (six) months. Administer in upper arm, thigh, or abdomen, Disp: , Rfl:  .  fluticasone (FLONASE) 50 MCG/ACT nasal spray, SHAKE LIQUID AND USE 2 SPRAYS IN EACH NOSTRIL DAILY (Patient not taking: Reported on 08/20/2017), Disp: 48 g, Rfl: 3 .  lisinopril-hydrochlorothiazide (PRINZIDE,ZESTORETIC) 20-12.5 MG tablet, Take 1 tablet by mouth daily., Disp: 90 tablet, Rfl: 1 .  loperamide (IMODIUM) 2 MG capsule, Take 1 capsule (2 mg total) by mouth as needed for diarrhea or loose stools. (Patient not taking: Reported on 08/20/2017), Disp: 20 capsule, Rfl: 0 .  megestrol (MEGACE ES) 625 MG/5ML suspension, Take 5 mLs (625 mg total) by mouth daily., Disp: 150 mL, Rfl: 4 .  ondansetron (ZOFRAN) 4 MG tablet, Take 1 tablet (4 mg total) by mouth every 6 (six) hours. (Patient not taking: Reported on 08/20/2017), Disp: 12 tablet, Rfl: 0 .  pantoprazole (PROTONIX)  40 MG tablet, TAKE 1 TABLET BY MOUTH EVERY DAY (Patient not taking: Reported on 08/20/2017), Disp: 90 tablet, Rfl: 1 .  simvastatin (ZOCOR) 40 MG tablet, TAKE 1 TABLET AT BEDTIME, Disp: 90 tablet, Rfl: 0  Allergies:  Allergies  Allergen Reactions  . Ibuprofen Nausea Only  . Morphine Nausea And Vomiting    Past Medical History, Surgical history, Social history, and Family History were reviewed and updated.  Review of Systems: Review of Systems  Constitutional: Negative for appetite change, fatigue, fever and unexpected weight change.  HENT:   Negative for lump/mass, mouth sores, sore throat and trouble swallowing.   Respiratory: Negative for cough, hemoptysis and shortness of breath.   Cardiovascular: Negative for leg swelling and palpitations.  Gastrointestinal: Negative for abdominal distention, abdominal pain, blood in stool, constipation, diarrhea, nausea and vomiting.  Genitourinary: Negative for bladder  incontinence, dysuria, frequency and hematuria.   Musculoskeletal: Negative for arthralgias, back pain, gait problem and myalgias.  Skin: Negative for itching and rash.  Neurological: Negative for dizziness, extremity weakness, gait problem, headaches, numbness, seizures and speech difficulty.  Hematological: Does not bruise/bleed easily.  Psychiatric/Behavioral: Negative for depression and sleep disturbance. The patient is not nervous/anxious.     Physical Exam:  weight is 96 lb (43.5 kg). Her oral temperature is 97.9 F (36.6 C). Her blood pressure is 140/54 (abnormal) and her pulse is 95. Her respiration is 19 and oxygen saturation is 100%.   Wt Readings from Last 3 Encounters:  09/02/17 96 lb (43.5 kg)  08/20/17 99 lb (44.9 kg)  07/29/17 100 lb (45.4 kg)    Physical Exam  Constitutional: She is oriented to person, place, and time.  HENT:  Head: Normocephalic and atraumatic.  Mouth/Throat: Oropharynx is clear and moist.  Eyes: Pupils are equal, round, and reactive to light. EOM are normal.  Neck: Normal range of motion.  Cardiovascular: Normal rate, regular rhythm and normal heart sounds.   Pulmonary/Chest: Effort normal and breath sounds normal.  Abdominal: Soft. Bowel sounds are normal.  Musculoskeletal: Normal range of motion. She exhibits no edema, tenderness or deformity.  She has marked lymphedema of the left arm. There is some slight erythema associated with the left arm. She has no tenderness to palpation. She has multiple subcutaneous nodules on the left arm consistent with her porocarcinoma in-transit metastasis  Lymphadenopathy:    She has no cervical adenopathy.  Neurological: She is alert and oriented to person, place, and time.  Skin: Skin is warm and dry. No rash noted. No erythema.  Psychiatric: She has a normal mood and affect. Her behavior is normal. Judgment and thought content normal.  Vitals reviewed.    Lab Results  Component Value Date   WBC 3.3 (L)  09/02/2017   HGB 9.6 (L) 09/02/2017   HCT 30.4 (L) 09/02/2017   MCV 89 09/02/2017   PLT 212 09/02/2017     Chemistry      Component Value Date/Time   NA 135 09/02/2017 1511   NA 134 (L) 04/20/2016 0819   K 3.0 (LL) 09/02/2017 1511   K 3.8 04/20/2016 0819   CL 99 09/02/2017 1511   CL 101 05/02/2013 0847   CO2 31 09/02/2017 1511   CO2 30 (H) 04/20/2016 0819   BUN 13 09/02/2017 1511   BUN 24.2 04/20/2016 0819   CREATININE 1.0 09/02/2017 1511   CREATININE 1.2 (H) 04/20/2016 0819   GLU 83 12/18/2015      Component Value Date/Time   CALCIUM 8.7 09/02/2017  1511   CALCIUM 9.3 04/20/2016 0819   ALKPHOS 65 09/02/2017 1511   ALKPHOS 38 (L) 04/20/2016 0819   AST 20 09/02/2017 1511   AST 15 04/20/2016 0819   ALT 16 09/02/2017 1511   ALT 11 04/20/2016 0819   BILITOT 1.30 09/02/2017 1511   BILITOT 1.29 (H) 04/20/2016 0819         Impression and Plan: Ms. Keady is an 83 year old white female with multiple malignancies. The only active malignancy now is this porocarcinoma.  We hopefully will get her weight up. Hopefully, the Megace will work.  I have a feeling that the anemia is a multifactorial issue. I think that her weight is a factor with her anemia. I also think that the past chemotherapy that she is received is a factor. I am sending off an erythropoietin level on her. I am sending off a iron panel.  Hopefully, we can improve her quality of life. With the holidays coming up, I want to make sure that we optimize her performance status so that she can enjoy the holidays with her family.   I would like to see her back in 4 weeks.  I spent about 35 minutes with she and her son. I went over the labs. I explained to her my recommendation for Aranesp if her hemoglobin drops. I told her that we might need a bone marrow test but that would be the "test of last resort."  I really think that the erythropoietin level and iron studies will give [place] a very good idea as to what is going  on with her anemia.  Volanda Napoleon, MD 10/18/20184:42 PM

## 2017-09-02 NOTE — Telephone Encounter (Signed)
Critical Value Potassium 3.0 Dr Ennever notified. No orders at this time.  

## 2017-09-03 ENCOUNTER — Ambulatory Visit
Admission: RE | Admit: 2017-09-03 | Discharge: 2017-09-03 | Disposition: A | Discharge status: Home / Self Care | Payer: Medicare HMO | Source: Ambulatory Visit | Attending: Radiation Oncology | Admitting: Radiation Oncology

## 2017-09-03 ENCOUNTER — Encounter: Payer: Self-pay | Admitting: Hematology & Oncology

## 2017-09-03 DIAGNOSIS — D500 Iron deficiency anemia secondary to blood loss (chronic): Secondary | ICD-10-CM | POA: Insufficient documentation

## 2017-09-03 DIAGNOSIS — Z853 Personal history of malignant neoplasm of breast: Secondary | ICD-10-CM | POA: Diagnosis not present

## 2017-09-03 DIAGNOSIS — Z9012 Acquired absence of left breast and nipple: Secondary | ICD-10-CM | POA: Diagnosis not present

## 2017-09-03 DIAGNOSIS — Z923 Personal history of irradiation: Secondary | ICD-10-CM | POA: Diagnosis not present

## 2017-09-03 DIAGNOSIS — Z510 Encounter for antineoplastic radiation therapy: Secondary | ICD-10-CM | POA: Diagnosis not present

## 2017-09-03 DIAGNOSIS — Z79899 Other long term (current) drug therapy: Secondary | ICD-10-CM | POA: Diagnosis not present

## 2017-09-03 DIAGNOSIS — Z7982 Long term (current) use of aspirin: Secondary | ICD-10-CM | POA: Diagnosis not present

## 2017-09-03 DIAGNOSIS — C44699 Other specified malignant neoplasm of skin of left upper limb, including shoulder: Secondary | ICD-10-CM | POA: Diagnosis not present

## 2017-09-03 DIAGNOSIS — K909 Intestinal malabsorption, unspecified: Secondary | ICD-10-CM | POA: Insufficient documentation

## 2017-09-03 DIAGNOSIS — I890 Lymphedema, not elsewhere classified: Secondary | ICD-10-CM | POA: Diagnosis not present

## 2017-09-03 DIAGNOSIS — Z9221 Personal history of antineoplastic chemotherapy: Secondary | ICD-10-CM | POA: Diagnosis not present

## 2017-09-03 DIAGNOSIS — C4499 Other specified malignant neoplasm of skin, unspecified: Secondary | ICD-10-CM

## 2017-09-03 DIAGNOSIS — Z886 Allergy status to analgesic agent status: Secondary | ICD-10-CM | POA: Diagnosis not present

## 2017-09-03 HISTORY — DX: Intestinal malabsorption, unspecified: K90.9

## 2017-09-03 LAB — FERRITIN: Ferritin: 67 ng/ml (ref 9–269)

## 2017-09-03 LAB — ERYTHROPOIETIN: ERYTHROPOIETIN: 29.3 m[IU]/mL — AB (ref 2.6–18.5)

## 2017-09-03 LAB — IRON AND TIBC
%SAT: 10 % — ABNORMAL LOW (ref 21–57)
Iron: 28 ug/dL — ABNORMAL LOW (ref 41–142)
TIBC: 278 ug/dL (ref 236–444)
UIBC: 249 ug/dL (ref 120–384)

## 2017-09-03 LAB — RETICULOCYTES: Reticulocyte Count: 1.3 % (ref 0.6–2.6)

## 2017-09-03 MED ORDER — BIAFINE EX EMUL
Freq: Two times a day (BID) | CUTANEOUS | Status: DC
  Administered 2017-09-03: 14:00:00 via TOPICAL

## 2017-09-03 NOTE — Addendum Note (Signed)
Addended by: Burney Gauze R on: 09/03/2017 05:04 PM   Modules accepted: Orders

## 2017-09-04 ENCOUNTER — Other Ambulatory Visit: Payer: Self-pay | Admitting: Internal Medicine

## 2017-09-04 ENCOUNTER — Ambulatory Visit: Payer: Medicare HMO

## 2017-09-06 ENCOUNTER — Ambulatory Visit
Admission: RE | Admit: 2017-09-06 | Discharge: 2017-09-06 | Disposition: A | Discharge status: Home / Self Care | Payer: Medicare HMO | Source: Ambulatory Visit | Attending: Radiation Oncology | Admitting: Radiation Oncology

## 2017-09-06 ENCOUNTER — Telehealth: Payer: Self-pay

## 2017-09-06 DIAGNOSIS — C44699 Other specified malignant neoplasm of skin of left upper limb, including shoulder: Secondary | ICD-10-CM | POA: Diagnosis not present

## 2017-09-06 DIAGNOSIS — Z923 Personal history of irradiation: Secondary | ICD-10-CM | POA: Diagnosis not present

## 2017-09-06 DIAGNOSIS — Z886 Allergy status to analgesic agent status: Secondary | ICD-10-CM | POA: Diagnosis not present

## 2017-09-06 DIAGNOSIS — I890 Lymphedema, not elsewhere classified: Secondary | ICD-10-CM | POA: Diagnosis not present

## 2017-09-06 DIAGNOSIS — Z510 Encounter for antineoplastic radiation therapy: Secondary | ICD-10-CM | POA: Diagnosis not present

## 2017-09-06 DIAGNOSIS — Z79899 Other long term (current) drug therapy: Secondary | ICD-10-CM | POA: Diagnosis not present

## 2017-09-06 DIAGNOSIS — Z7982 Long term (current) use of aspirin: Secondary | ICD-10-CM | POA: Diagnosis not present

## 2017-09-06 DIAGNOSIS — Z853 Personal history of malignant neoplasm of breast: Secondary | ICD-10-CM | POA: Diagnosis not present

## 2017-09-06 DIAGNOSIS — Z9012 Acquired absence of left breast and nipple: Secondary | ICD-10-CM | POA: Diagnosis not present

## 2017-09-06 DIAGNOSIS — Z9221 Personal history of antineoplastic chemotherapy: Secondary | ICD-10-CM | POA: Diagnosis not present

## 2017-09-06 NOTE — Telephone Encounter (Addendum)
-----   Message from Volanda Napoleon, MD sent at 09/03/2017  1:32 PM EDT ----- Call her son -- Iron is very low!!  She needs 2 doses of Feraheme.  Please set up for next 1-2 week.  pete   Pt's son notified by phone of above message. Transferred to scheduling for appts. dph

## 2017-09-07 ENCOUNTER — Ambulatory Visit
Admission: RE | Admit: 2017-09-07 | Discharge: 2017-09-07 | Disposition: A | Discharge status: Home / Self Care | Payer: Medicare HMO | Source: Ambulatory Visit | Attending: Radiation Oncology | Admitting: Radiation Oncology

## 2017-09-07 DIAGNOSIS — Z886 Allergy status to analgesic agent status: Secondary | ICD-10-CM | POA: Diagnosis not present

## 2017-09-07 DIAGNOSIS — Z9221 Personal history of antineoplastic chemotherapy: Secondary | ICD-10-CM | POA: Diagnosis not present

## 2017-09-07 DIAGNOSIS — Z923 Personal history of irradiation: Secondary | ICD-10-CM | POA: Diagnosis not present

## 2017-09-07 DIAGNOSIS — Z9012 Acquired absence of left breast and nipple: Secondary | ICD-10-CM | POA: Diagnosis not present

## 2017-09-07 DIAGNOSIS — C44699 Other specified malignant neoplasm of skin of left upper limb, including shoulder: Secondary | ICD-10-CM | POA: Diagnosis not present

## 2017-09-07 DIAGNOSIS — Z510 Encounter for antineoplastic radiation therapy: Secondary | ICD-10-CM | POA: Diagnosis not present

## 2017-09-07 DIAGNOSIS — Z853 Personal history of malignant neoplasm of breast: Secondary | ICD-10-CM | POA: Diagnosis not present

## 2017-09-07 DIAGNOSIS — Z79899 Other long term (current) drug therapy: Secondary | ICD-10-CM | POA: Diagnosis not present

## 2017-09-07 DIAGNOSIS — Z7982 Long term (current) use of aspirin: Secondary | ICD-10-CM | POA: Diagnosis not present

## 2017-09-07 DIAGNOSIS — I890 Lymphedema, not elsewhere classified: Secondary | ICD-10-CM | POA: Diagnosis not present

## 2017-09-08 ENCOUNTER — Ambulatory Visit
Admission: RE | Admit: 2017-09-08 | Discharge: 2017-09-08 | Disposition: A | Discharge status: Home / Self Care | Payer: Medicare HMO | Source: Ambulatory Visit | Attending: Radiation Oncology | Admitting: Radiation Oncology

## 2017-09-08 DIAGNOSIS — Z510 Encounter for antineoplastic radiation therapy: Secondary | ICD-10-CM | POA: Diagnosis not present

## 2017-09-08 DIAGNOSIS — Z9221 Personal history of antineoplastic chemotherapy: Secondary | ICD-10-CM | POA: Diagnosis not present

## 2017-09-08 DIAGNOSIS — Z886 Allergy status to analgesic agent status: Secondary | ICD-10-CM | POA: Diagnosis not present

## 2017-09-08 DIAGNOSIS — Z853 Personal history of malignant neoplasm of breast: Secondary | ICD-10-CM | POA: Diagnosis not present

## 2017-09-08 DIAGNOSIS — Z7982 Long term (current) use of aspirin: Secondary | ICD-10-CM | POA: Diagnosis not present

## 2017-09-08 DIAGNOSIS — C44699 Other specified malignant neoplasm of skin of left upper limb, including shoulder: Secondary | ICD-10-CM | POA: Diagnosis not present

## 2017-09-08 DIAGNOSIS — Z79899 Other long term (current) drug therapy: Secondary | ICD-10-CM | POA: Diagnosis not present

## 2017-09-08 DIAGNOSIS — Z9012 Acquired absence of left breast and nipple: Secondary | ICD-10-CM | POA: Diagnosis not present

## 2017-09-08 DIAGNOSIS — I890 Lymphedema, not elsewhere classified: Secondary | ICD-10-CM | POA: Diagnosis not present

## 2017-09-08 DIAGNOSIS — Z923 Personal history of irradiation: Secondary | ICD-10-CM | POA: Diagnosis not present

## 2017-09-09 ENCOUNTER — Ambulatory Visit
Admission: RE | Admit: 2017-09-09 | Discharge: 2017-09-09 | Disposition: A | Discharge status: Home / Self Care | Payer: Medicare HMO | Source: Ambulatory Visit | Attending: Radiation Oncology | Admitting: Radiation Oncology

## 2017-09-09 DIAGNOSIS — Z9012 Acquired absence of left breast and nipple: Secondary | ICD-10-CM | POA: Diagnosis not present

## 2017-09-09 DIAGNOSIS — Z886 Allergy status to analgesic agent status: Secondary | ICD-10-CM | POA: Diagnosis not present

## 2017-09-09 DIAGNOSIS — Z923 Personal history of irradiation: Secondary | ICD-10-CM | POA: Diagnosis not present

## 2017-09-09 DIAGNOSIS — Z853 Personal history of malignant neoplasm of breast: Secondary | ICD-10-CM | POA: Diagnosis not present

## 2017-09-09 DIAGNOSIS — Z9221 Personal history of antineoplastic chemotherapy: Secondary | ICD-10-CM | POA: Diagnosis not present

## 2017-09-09 DIAGNOSIS — Z510 Encounter for antineoplastic radiation therapy: Secondary | ICD-10-CM | POA: Diagnosis not present

## 2017-09-09 DIAGNOSIS — Z79899 Other long term (current) drug therapy: Secondary | ICD-10-CM | POA: Diagnosis not present

## 2017-09-09 DIAGNOSIS — C44699 Other specified malignant neoplasm of skin of left upper limb, including shoulder: Secondary | ICD-10-CM | POA: Diagnosis not present

## 2017-09-09 DIAGNOSIS — I890 Lymphedema, not elsewhere classified: Secondary | ICD-10-CM | POA: Diagnosis not present

## 2017-09-09 DIAGNOSIS — Z7982 Long term (current) use of aspirin: Secondary | ICD-10-CM | POA: Diagnosis not present

## 2017-09-10 ENCOUNTER — Ambulatory Visit
Admission: RE | Admit: 2017-09-10 | Discharge: 2017-09-10 | Disposition: A | Discharge status: Home / Self Care | Payer: Medicare HMO | Source: Ambulatory Visit | Attending: Radiation Oncology | Admitting: Radiation Oncology

## 2017-09-10 DIAGNOSIS — Z9221 Personal history of antineoplastic chemotherapy: Secondary | ICD-10-CM | POA: Diagnosis not present

## 2017-09-10 DIAGNOSIS — Z510 Encounter for antineoplastic radiation therapy: Secondary | ICD-10-CM | POA: Diagnosis not present

## 2017-09-10 DIAGNOSIS — Z79899 Other long term (current) drug therapy: Secondary | ICD-10-CM | POA: Diagnosis not present

## 2017-09-10 DIAGNOSIS — Z7982 Long term (current) use of aspirin: Secondary | ICD-10-CM | POA: Diagnosis not present

## 2017-09-10 DIAGNOSIS — Z9012 Acquired absence of left breast and nipple: Secondary | ICD-10-CM | POA: Diagnosis not present

## 2017-09-10 DIAGNOSIS — I890 Lymphedema, not elsewhere classified: Secondary | ICD-10-CM | POA: Diagnosis not present

## 2017-09-10 DIAGNOSIS — Z923 Personal history of irradiation: Secondary | ICD-10-CM | POA: Diagnosis not present

## 2017-09-10 DIAGNOSIS — Z886 Allergy status to analgesic agent status: Secondary | ICD-10-CM | POA: Diagnosis not present

## 2017-09-10 DIAGNOSIS — C44699 Other specified malignant neoplasm of skin of left upper limb, including shoulder: Secondary | ICD-10-CM | POA: Diagnosis not present

## 2017-09-10 DIAGNOSIS — Z853 Personal history of malignant neoplasm of breast: Secondary | ICD-10-CM | POA: Diagnosis not present

## 2017-09-13 ENCOUNTER — Ambulatory Visit
Admission: RE | Admit: 2017-09-13 | Discharge: 2017-09-13 | Disposition: A | Discharge status: Home / Self Care | Payer: Medicare HMO | Source: Ambulatory Visit | Attending: Radiation Oncology | Admitting: Radiation Oncology

## 2017-09-13 DIAGNOSIS — Z510 Encounter for antineoplastic radiation therapy: Secondary | ICD-10-CM | POA: Diagnosis not present

## 2017-09-13 DIAGNOSIS — Z923 Personal history of irradiation: Secondary | ICD-10-CM | POA: Diagnosis not present

## 2017-09-13 DIAGNOSIS — Z79899 Other long term (current) drug therapy: Secondary | ICD-10-CM | POA: Diagnosis not present

## 2017-09-13 DIAGNOSIS — Z9012 Acquired absence of left breast and nipple: Secondary | ICD-10-CM | POA: Diagnosis not present

## 2017-09-13 DIAGNOSIS — I890 Lymphedema, not elsewhere classified: Secondary | ICD-10-CM | POA: Diagnosis not present

## 2017-09-13 DIAGNOSIS — Z886 Allergy status to analgesic agent status: Secondary | ICD-10-CM | POA: Diagnosis not present

## 2017-09-13 DIAGNOSIS — Z7982 Long term (current) use of aspirin: Secondary | ICD-10-CM | POA: Diagnosis not present

## 2017-09-13 DIAGNOSIS — Z853 Personal history of malignant neoplasm of breast: Secondary | ICD-10-CM | POA: Diagnosis not present

## 2017-09-13 DIAGNOSIS — C44699 Other specified malignant neoplasm of skin of left upper limb, including shoulder: Secondary | ICD-10-CM | POA: Diagnosis not present

## 2017-09-13 DIAGNOSIS — Z9221 Personal history of antineoplastic chemotherapy: Secondary | ICD-10-CM | POA: Diagnosis not present

## 2017-09-14 ENCOUNTER — Encounter: Payer: Self-pay | Admitting: Radiation Oncology

## 2017-09-14 ENCOUNTER — Ambulatory Visit: Payer: Medicare HMO

## 2017-09-14 ENCOUNTER — Ambulatory Visit
Admission: RE | Admit: 2017-09-14 | Discharge: 2017-09-14 | Disposition: A | Discharge status: Home / Self Care | Payer: Medicare HMO | Source: Ambulatory Visit | Attending: Radiation Oncology | Admitting: Radiation Oncology

## 2017-09-14 DIAGNOSIS — I890 Lymphedema, not elsewhere classified: Secondary | ICD-10-CM | POA: Diagnosis not present

## 2017-09-14 DIAGNOSIS — Z853 Personal history of malignant neoplasm of breast: Secondary | ICD-10-CM | POA: Diagnosis not present

## 2017-09-14 DIAGNOSIS — C44699 Other specified malignant neoplasm of skin of left upper limb, including shoulder: Secondary | ICD-10-CM | POA: Diagnosis not present

## 2017-09-14 DIAGNOSIS — Z886 Allergy status to analgesic agent status: Secondary | ICD-10-CM | POA: Diagnosis not present

## 2017-09-14 DIAGNOSIS — Z923 Personal history of irradiation: Secondary | ICD-10-CM | POA: Diagnosis not present

## 2017-09-14 DIAGNOSIS — Z79899 Other long term (current) drug therapy: Secondary | ICD-10-CM | POA: Diagnosis not present

## 2017-09-14 DIAGNOSIS — Z9221 Personal history of antineoplastic chemotherapy: Secondary | ICD-10-CM | POA: Diagnosis not present

## 2017-09-14 DIAGNOSIS — Z9012 Acquired absence of left breast and nipple: Secondary | ICD-10-CM | POA: Diagnosis not present

## 2017-09-14 DIAGNOSIS — Z7982 Long term (current) use of aspirin: Secondary | ICD-10-CM | POA: Diagnosis not present

## 2017-09-14 DIAGNOSIS — Z510 Encounter for antineoplastic radiation therapy: Secondary | ICD-10-CM | POA: Diagnosis not present

## 2017-09-15 ENCOUNTER — Ambulatory Visit: Payer: Medicare HMO

## 2017-09-15 NOTE — Progress Notes (Signed)
  Radiation Oncology         [phone] ________________________________  Name: Judy Herrera MRN: [id number]  Date: 09/14/2017  DOB: 19-Jan-1931  End of Treatment Note  Diagnosis:   83 y.o. female with porocarcinoma involving the left upper extremity     Indication for treatment:  Palliative       Radiation treatment dates:   09/01/2017 - 09/14/2017  Site/dose:   The left arm was treated to 30 Gy in 10 fractions of 3 Gy.  Beams/energy:   Isodose Plan // 9E Electron  Narrative: The patient tolerated radiation treatment relatively well.   She denied any pain or fatigue. Hyperpigmentation to the treatment site was noted, but she denied any itching, burning, or drainage from the site.   Plan: The patient has completed radiation treatment. She was advised to apply cream to her skin. The patient will return to radiation oncology clinic for routine followup in one month. I advised her to call or return sooner if she has any questions or concerns related to her recovery or treatment. ________________________________  Sheral Apley. Tammi Klippel, M.D.  This document serves as a record of services personally performed by Tyler Pita, MD. It was created on his behalf by Rae Lips, a trained medical scribe. The creation of this record is based on the scribe's personal observations and the provider's statements to them. This document has been checked and approved by the attending provider.

## 2017-09-16 DIAGNOSIS — S3210XA Unspecified fracture of sacrum, initial encounter for closed fracture: Secondary | ICD-10-CM

## 2017-09-16 HISTORY — DX: Unspecified fracture of sacrum, initial encounter for closed fracture: S32.10XA

## 2017-09-20 ENCOUNTER — Encounter: Payer: Medicare HMO | Admitting: Hematology

## 2017-09-20 ENCOUNTER — Encounter: Payer: Self-pay | Admitting: Hematology

## 2017-09-20 ENCOUNTER — Other Ambulatory Visit (HOSPITAL_BASED_OUTPATIENT_CLINIC_OR_DEPARTMENT_OTHER): Payer: Medicare HMO

## 2017-09-20 DIAGNOSIS — C44609 Unspecified malignant neoplasm of skin of left upper limb, including shoulder: Secondary | ICD-10-CM | POA: Diagnosis not present

## 2017-09-20 DIAGNOSIS — Z299 Encounter for prophylactic measures, unspecified: Secondary | ICD-10-CM

## 2017-09-20 DIAGNOSIS — C4499 Other specified malignant neoplasm of skin, unspecified: Secondary | ICD-10-CM

## 2017-09-20 DIAGNOSIS — R64 Cachexia: Secondary | ICD-10-CM

## 2017-09-20 LAB — CBC WITH DIFFERENTIAL/PLATELET
BASO%: 0.5 % (ref 0.0–2.0)
Basophils Absolute: 0 10*3/uL (ref 0.0–0.1)
EOS%: 1.6 % (ref 0.0–7.0)
Eosinophils Absolute: 0.1 10*3/uL (ref 0.0–0.5)
HCT: 31.4 % — ABNORMAL LOW (ref 34.8–46.6)
HGB: 9.9 g/dL — ABNORMAL LOW (ref 11.6–15.9)
LYMPH%: 16.6 % (ref 14.0–49.7)
MCH: 27.5 pg (ref 25.1–34.0)
MCHC: 31.5 g/dL (ref 31.5–36.0)
MCV: 87.2 fL (ref 79.5–101.0)
MONO#: 0.5 10*3/uL (ref 0.1–0.9)
MONO%: 10.5 % (ref 0.0–14.0)
NEUT%: 70.8 % (ref 38.4–76.8)
NEUTROS ABS: 3.1 10*3/uL (ref 1.5–6.5)
PLATELETS: 217 10*3/uL (ref 145–400)
RBC: 3.6 10*6/uL — AB (ref 3.70–5.45)
RDW: 15.8 % — AB (ref 11.2–14.5)
WBC: 4.4 10*3/uL (ref 3.9–10.3)
lymph#: 0.7 10*3/uL — ABNORMAL LOW (ref 0.9–3.3)
nRBC: 0 % (ref 0–0)

## 2017-09-20 LAB — COMPREHENSIVE METABOLIC PANEL
ALK PHOS: 59 U/L (ref 40–150)
ALT: 7 U/L (ref 0–55)
AST: 12 U/L (ref 5–34)
Albumin: 3.1 g/dL — ABNORMAL LOW (ref 3.5–5.0)
Anion Gap: 11 mEq/L (ref 3–11)
BILIRUBIN TOTAL: 1.08 mg/dL (ref 0.20–1.20)
BUN: 13 mg/dL (ref 7.0–26.0)
CO2: 24 mEq/L (ref 22–29)
Calcium: 9 mg/dL (ref 8.4–10.4)
Chloride: 101 mEq/L (ref 98–109)
Creatinine: 0.8 mg/dL (ref 0.6–1.1)
Glucose: 73 mg/dl (ref 70–140)
Potassium: 3.6 mEq/L (ref 3.5–5.1)
Sodium: 136 mEq/L (ref 136–145)
Total Protein: 6.7 g/dL (ref 6.4–8.3)

## 2017-09-20 NOTE — Progress Notes (Signed)
This encounter was created in error - please disregard.

## 2017-09-21 ENCOUNTER — Other Ambulatory Visit: Payer: Self-pay | Admitting: Family

## 2017-09-21 ENCOUNTER — Ambulatory Visit (HOSPITAL_BASED_OUTPATIENT_CLINIC_OR_DEPARTMENT_OTHER): Payer: Medicare HMO

## 2017-09-21 VITALS — BP 122/48 | HR 94 | Temp 98.1°F | Resp 20

## 2017-09-21 DIAGNOSIS — D500 Iron deficiency anemia secondary to blood loss (chronic): Secondary | ICD-10-CM

## 2017-09-21 DIAGNOSIS — K909 Intestinal malabsorption, unspecified: Secondary | ICD-10-CM

## 2017-09-21 DIAGNOSIS — D509 Iron deficiency anemia, unspecified: Secondary | ICD-10-CM

## 2017-09-21 MED ORDER — SODIUM CHLORIDE 0.9 % IV SOLN
510.0000 mg | Freq: Once | INTRAVENOUS | Status: AC
  Administered 2017-09-21: 510 mg via INTRAVENOUS
  Filled 2017-09-21: qty 17

## 2017-09-21 MED ORDER — SODIUM CHLORIDE 0.9 % IV SOLN
Freq: Once | INTRAVENOUS | Status: AC
  Administered 2017-09-21: 10:00:00 via INTRAVENOUS

## 2017-09-21 NOTE — Patient Instructions (Signed)

## 2017-09-28 ENCOUNTER — Other Ambulatory Visit: Payer: Self-pay | Admitting: Internal Medicine

## 2017-09-28 ENCOUNTER — Ambulatory Visit: Payer: Medicare HMO

## 2017-09-29 ENCOUNTER — Ambulatory Visit: Payer: Medicare HMO

## 2017-09-29 ENCOUNTER — Other Ambulatory Visit: Payer: Self-pay | Admitting: *Deleted

## 2017-09-29 DIAGNOSIS — C569 Malignant neoplasm of unspecified ovary: Secondary | ICD-10-CM

## 2017-09-29 DIAGNOSIS — D500 Iron deficiency anemia secondary to blood loss (chronic): Secondary | ICD-10-CM

## 2017-09-30 ENCOUNTER — Other Ambulatory Visit: Payer: Self-pay

## 2017-09-30 ENCOUNTER — Other Ambulatory Visit (HOSPITAL_BASED_OUTPATIENT_CLINIC_OR_DEPARTMENT_OTHER): Payer: Medicare HMO

## 2017-09-30 ENCOUNTER — Encounter: Payer: Self-pay | Admitting: Hematology & Oncology

## 2017-09-30 ENCOUNTER — Ambulatory Visit (HOSPITAL_BASED_OUTPATIENT_CLINIC_OR_DEPARTMENT_OTHER): Payer: Medicare HMO | Admitting: Hematology & Oncology

## 2017-09-30 ENCOUNTER — Ambulatory Visit (HOSPITAL_BASED_OUTPATIENT_CLINIC_OR_DEPARTMENT_OTHER): Payer: Medicare HMO

## 2017-09-30 VITALS — BP 127/50 | HR 89

## 2017-09-30 VITALS — BP 111/51 | HR 105 | Resp 20 | Wt 95.0 lb

## 2017-09-30 DIAGNOSIS — I890 Lymphedema, not elsewhere classified: Secondary | ICD-10-CM

## 2017-09-30 DIAGNOSIS — Z853 Personal history of malignant neoplasm of breast: Secondary | ICD-10-CM

## 2017-09-30 DIAGNOSIS — D500 Iron deficiency anemia secondary to blood loss (chronic): Secondary | ICD-10-CM | POA: Diagnosis not present

## 2017-09-30 DIAGNOSIS — C44609 Unspecified malignant neoplasm of skin of left upper limb, including shoulder: Secondary | ICD-10-CM

## 2017-09-30 DIAGNOSIS — Z8543 Personal history of malignant neoplasm of ovary: Secondary | ICD-10-CM | POA: Diagnosis not present

## 2017-09-30 DIAGNOSIS — C569 Malignant neoplasm of unspecified ovary: Secondary | ICD-10-CM

## 2017-09-30 DIAGNOSIS — L539 Erythematous condition, unspecified: Secondary | ICD-10-CM | POA: Diagnosis not present

## 2017-09-30 DIAGNOSIS — Z803 Family history of malignant neoplasm of breast: Secondary | ICD-10-CM

## 2017-09-30 DIAGNOSIS — K909 Intestinal malabsorption, unspecified: Secondary | ICD-10-CM

## 2017-09-30 LAB — CMP (CANCER CENTER ONLY)
ALBUMIN: 3.2 g/dL — AB (ref 3.3–5.5)
ALK PHOS: 58 U/L (ref 26–84)
ALT: 15 U/L (ref 10–47)
AST: 13 U/L (ref 11–38)
BILIRUBIN TOTAL: 1 mg/dL (ref 0.20–1.60)
BUN, Bld: 21 mg/dL (ref 7–22)
CALCIUM: 8.5 mg/dL (ref 8.0–10.3)
CO2: 27 meq/L (ref 18–33)
Chloride: 103 mEq/L (ref 98–108)
Creat: 1.4 mg/dl — ABNORMAL HIGH (ref 0.6–1.2)
Glucose, Bld: 112 mg/dL (ref 73–118)
POTASSIUM: 3.3 meq/L (ref 3.3–4.7)
Sodium: 139 mEq/L (ref 128–145)
Total Protein: 6.5 g/dL (ref 6.4–8.1)

## 2017-09-30 LAB — CBC WITH DIFFERENTIAL (CANCER CENTER ONLY)
BASO#: 0 10*3/uL (ref 0.0–0.2)
BASO%: 0.2 % (ref 0.0–2.0)
EOS%: 1.5 % (ref 0.0–7.0)
Eosinophils Absolute: 0.1 10*3/uL (ref 0.0–0.5)
HEMATOCRIT: 31.2 % — AB (ref 34.8–46.6)
HGB: 9.9 g/dL — ABNORMAL LOW (ref 11.6–15.9)
LYMPH#: 0.9 10*3/uL (ref 0.9–3.3)
LYMPH%: 18.4 % (ref 14.0–48.0)
MCH: 28.3 pg (ref 26.0–34.0)
MCHC: 31.7 g/dL — AB (ref 32.0–36.0)
MCV: 89 fL (ref 81–101)
MONO#: 0.5 10*3/uL (ref 0.1–0.9)
MONO%: 10.6 % (ref 0.0–13.0)
NEUT%: 69.3 % (ref 39.6–80.0)
NEUTROS ABS: 3.3 10*3/uL (ref 1.5–6.5)
Platelets: 222 10*3/uL (ref 145–400)
RBC: 3.5 10*6/uL — ABNORMAL LOW (ref 3.70–5.32)
RDW: 17 % — ABNORMAL HIGH (ref 11.1–15.7)
WBC: 4.7 10*3/uL (ref 3.9–10.0)

## 2017-09-30 MED ORDER — SODIUM CHLORIDE 0.9 % IV SOLN
Freq: Once | INTRAVENOUS | Status: AC
  Administered 2017-09-30: 13:00:00 via INTRAVENOUS

## 2017-09-30 MED ORDER — SODIUM CHLORIDE 0.9 % IV SOLN
510.0000 mg | Freq: Once | INTRAVENOUS | Status: AC
  Administered 2017-09-30: 510 mg via INTRAVENOUS
  Filled 2017-09-30: qty 17

## 2017-09-30 NOTE — Patient Instructions (Signed)

## 2017-09-30 NOTE — Progress Notes (Signed)
Hematology and Oncology Follow Up Visit  Judy Herrera [id number] Jun 16, 1931 82 y.o. 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  She is doing a little bit better.  Her weight is holding steady.  She is iron deficient.  We last saw her, her ferritin was 67 per iron saturation was only 10%.  I think the ferritin is "normal" because of inflammatory conditions.  She has had 1 dose of Feraheme already.  She will get her second dose today.  She is not complaining of pain.  She is not complaining of nausea or vomiting.  She is eating a little bit better.  There is no change in bowel or bladder habits.  Her left arm does not look is swollen.  The malignant wounds do seem to be "drying up."  She has had no fever.  She has had no leg swelling.  Hopefully, she will have a nice Thanksgiving and Christmas.     Currently, her performance status is ECOG 2-3.  Medications:  Current Outpatient Medications:  .  amoxicillin (AMOXIL) 500 MG capsule, TAKE 4 CAPSULES 1 HOUR PRIOR TO DENTAL APPOINTMENT, Disp: , Rfl: 0 .  aspirin EC 81 MG tablet, Take 81 mg by mouth at bedtime. , Disp: , Rfl:  .  calcium carbonate (TUMS - DOSED IN MG ELEMENTAL CALCIUM) 500 MG chewable tablet, Chew 2 tablets by mouth daily with supper., Disp: , Rfl:  .  carvedilol (COREG) 25 MG tablet, TAKE 1 TABLET TWICE A DAY WITH FOOD, Disp: 180  tablet, Rfl: 0 .  Cholecalciferol (VITAMIN D) 2000 UNITS tablet, Take 2,000 Units by mouth every morning. , Disp: , Rfl:  .  Coenzyme Q10 (CO Q 10) 100 MG CAPS, Take 1 capsule by mouth every morning. , Disp: , Rfl:  .  denosumab (PROLIA) 60 MG/ML SOLN injection, Inject 60 mg into the skin every 6 (six) months. Administer in upper arm, thigh, or abdomen, Disp: , Rfl:  .  fluticasone (FLONASE) 50 MCG/ACT nasal spray, SHAKE LIQUID AND USE 2 SPRAYS IN EACH NOSTRIL DAILY (Patient not taking: Reported on 08/20/2017), Disp: 48 g, Rfl: 3 .  lisinopril-hydrochlorothiazide (PRINZIDE,ZESTORETIC) 20-12.5 MG tablet, Take 1 tablet by mouth daily. (Patient not taking: Reported on 09/20/2017), Disp: 90 tablet, Rfl: 1 .  loperamide (IMODIUM) 2 MG capsule, Take 1 capsule (2 mg total) by mouth as needed for diarrhea or loose stools., Disp: 20 capsule, Rfl: 0 .  megestrol (MEGACE ES) 625 MG/5ML suspension, Take 5 mLs (625 mg total) by mouth daily., Disp: 150 mL, Rfl: 4 .  ondansetron (ZOFRAN) 4 MG tablet, Take 1 tablet (4  mg total) by mouth every 6 (six) hours. (Patient not taking: Reported on 08/20/2017), Disp: 12 tablet, Rfl: 0 .  pantoprazole (PROTONIX) 40 MG tablet, TAKE 1 TABLET BY MOUTH EVERY DAY (Patient not taking: Reported on 08/20/2017), Disp: 90 tablet, Rfl: 1 .  simvastatin (ZOCOR) 40 MG tablet, TAKE 1 TABLET AT BEDTIME, Disp: 90 tablet, Rfl: 0  Allergies:  Allergies  Allergen Reactions  . Ibuprofen Nausea Only  . Morphine Nausea And Vomiting    Past Medical History, Surgical history, Social history, and Family History were reviewed and updated.  Review of Systems: Review of Systems  Constitutional: Negative for appetite change, fatigue, fever and unexpected weight change.  HENT:   Negative for lump/mass, mouth sores, sore throat and trouble swallowing.   Respiratory: Negative for cough, hemoptysis and shortness of breath.   Cardiovascular: Negative for leg swelling and palpitations.    Gastrointestinal: Negative for abdominal distention, abdominal pain, blood in stool, constipation, diarrhea, nausea and vomiting.  Genitourinary: Negative for bladder incontinence, dysuria, frequency and hematuria.   Musculoskeletal: Negative for arthralgias, back pain, gait problem and myalgias.  Skin: Negative for itching and rash.  Neurological: Negative for dizziness, extremity weakness, gait problem, headaches, numbness, seizures and speech difficulty.  Hematological: Does not bruise/bleed easily.  Psychiatric/Behavioral: Negative for depression and sleep disturbance. The patient is not nervous/anxious.     Physical Exam:  weight is 95 lb (43.1 kg). Her blood pressure is 111/51 (abnormal) and her pulse is 105 (abnormal). Her respiration is 20 and oxygen saturation is 100%.   Wt Readings from Last 3 Encounters:  09/30/17 95 lb (43.1 kg)  09/20/17 93 lb (42.2 kg)  09/02/17 96 lb (43.5 kg)    Physical Exam  Constitutional: She is oriented to person, place, and time.  HENT:  Head: Normocephalic and atraumatic.  Mouth/Throat: Oropharynx is clear and moist.  Eyes: EOM are normal. Pupils are equal, round, and reactive to light.  Neck: Normal range of motion.  Cardiovascular: Normal rate, regular rhythm and normal heart sounds.  Pulmonary/Chest: Effort normal and breath sounds normal.  Abdominal: Soft. Bowel sounds are normal.  Musculoskeletal: Normal range of motion. She exhibits no edema, tenderness or deformity.  She has marked lymphedema of the left arm. There is some slight erythema associated with the left arm. She has no tenderness to palpation. She has multiple subcutaneous nodules on the left arm consistent with her porocarcinoma in-transit metastasis  Lymphadenopathy:    She has no cervical adenopathy.  Neurological: She is alert and oriented to person, place, and time.  Skin: Skin is warm and dry. No rash noted. No erythema.  Psychiatric: She has a normal mood and affect.  Her behavior is normal. Judgment and thought content normal.  Vitals reviewed.    Lab Results  Component Value Date   WBC 4.7 09/30/2017   HGB 9.9 (L) 09/30/2017   HCT 31.2 (L) 09/30/2017   MCV 89 09/30/2017   PLT 222 09/30/2017     Chemistry      Component Value Date/Time   NA 139 09/30/2017 1156   NA 136 09/20/2017 0850   K 3.3 09/30/2017 1156   K 3.6 09/20/2017 0850   CL 103 09/30/2017 1156   CL 101 05/02/2013 0847   CO2 27 09/30/2017 1156   CO2 24 09/20/2017 0850   BUN 21 09/30/2017 1156   BUN 13.0 09/20/2017 0850   CREATININE 1.4 (H) 09/30/2017 1156   CREATININE 0.8 09/20/2017 0850   GLU 83 12/18/2015  Component Value Date/Time   CALCIUM 8.5 09/30/2017 1156   CALCIUM 9.0 09/20/2017 0850   ALKPHOS 58 09/30/2017 1156   ALKPHOS 59 09/20/2017 0850   AST 13 09/30/2017 1156   AST 12 09/20/2017 0850   ALT 15 09/30/2017 1156   ALT 7 09/20/2017 0850   BILITOT 1.00 09/30/2017 1156   BILITOT 1.08 09/20/2017 0850         Impression and Plan: Judy Herrera is an 83 year old white female with multiple malignancies. The only active malignancy now is this porocarcinoma.  I am glad that she looks better.  I am glad that the left arm is not as swollen.  We will go ahead and give her iron today.  I will plan to see her back in another couple months.  I think we can get her through the holidays now.  Her son is very dedicated to helping her out.  This is very nice to see.    Volanda Napoleon, MD 11/15/201812:33 PM

## 2017-10-01 LAB — FERRITIN: Ferritin: 753 ng/ml — ABNORMAL HIGH (ref 9–269)

## 2017-10-01 LAB — IRON AND TIBC
%SAT: 22 % (ref 21–57)
IRON: 54 ug/dL (ref 41–142)
TIBC: 246 ug/dL (ref 236–444)
UIBC: 193 ug/dL (ref 120–384)

## 2017-10-02 ENCOUNTER — Other Ambulatory Visit: Payer: Self-pay | Admitting: Internal Medicine

## 2017-10-08 ENCOUNTER — Emergency Department (HOSPITAL_BASED_OUTPATIENT_CLINIC_OR_DEPARTMENT_OTHER): Payer: Medicare HMO

## 2017-10-08 ENCOUNTER — Emergency Department (HOSPITAL_BASED_OUTPATIENT_CLINIC_OR_DEPARTMENT_OTHER)
Admission: EM | Admit: 2017-10-08 | Discharge: 2017-10-08 | Disposition: A | Discharge status: Home / Self Care | Payer: Medicare HMO | Source: Home / Self Care | Attending: Emergency Medicine | Admitting: Emergency Medicine

## 2017-10-08 ENCOUNTER — Other Ambulatory Visit: Payer: Self-pay

## 2017-10-08 ENCOUNTER — Encounter (HOSPITAL_BASED_OUTPATIENT_CLINIC_OR_DEPARTMENT_OTHER): Payer: Self-pay | Admitting: Emergency Medicine

## 2017-10-08 DIAGNOSIS — Z923 Personal history of irradiation: Secondary | ICD-10-CM | POA: Insufficient documentation

## 2017-10-08 DIAGNOSIS — M25551 Pain in right hip: Secondary | ICD-10-CM

## 2017-10-08 DIAGNOSIS — Z79899 Other long term (current) drug therapy: Secondary | ICD-10-CM

## 2017-10-08 DIAGNOSIS — Z853 Personal history of malignant neoplasm of breast: Secondary | ICD-10-CM

## 2017-10-08 DIAGNOSIS — I10 Essential (primary) hypertension: Secondary | ICD-10-CM | POA: Insufficient documentation

## 2017-10-08 DIAGNOSIS — Z9221 Personal history of antineoplastic chemotherapy: Secondary | ICD-10-CM | POA: Insufficient documentation

## 2017-10-08 DIAGNOSIS — Z7982 Long term (current) use of aspirin: Secondary | ICD-10-CM | POA: Insufficient documentation

## 2017-10-08 DIAGNOSIS — S79911A Unspecified injury of right hip, initial encounter: Secondary | ICD-10-CM | POA: Diagnosis not present

## 2017-10-08 DIAGNOSIS — I2510 Atherosclerotic heart disease of native coronary artery without angina pectoris: Secondary | ICD-10-CM

## 2017-10-08 DIAGNOSIS — Z8543 Personal history of malignant neoplasm of ovary: Secondary | ICD-10-CM | POA: Insufficient documentation

## 2017-10-08 MED ORDER — ACETAMINOPHEN 325 MG PO TABS
650.0000 mg | ORAL_TABLET | Freq: Once | ORAL | Status: AC
  Administered 2017-10-08: 650 mg via ORAL
  Filled 2017-10-08: qty 2

## 2017-10-08 NOTE — ED Provider Notes (Signed)
[facility] EMERGENCY DEPARTMENT Provider Note   CSN: [id number] Arrival date & time: 10/08/17  0539     History   Chief Complaint Chief Complaint  Patient presents with  . Hip Pain    HPI Judy Herrera is a 83 y.o. female.  Patient with past history of ovarian cancer and breast cancer, presents with acute onset, gradually worsening, right posterior hip pain starting yesterday when she sat down "hard" onto a recliner.  Upon waking this morning, patient had a lot of trouble moving around.  Her son went to her house and she was unable to get up and open the door and he had to let himself in.  She has been bearing some weight, however notes much more pain with bearing weight.  She has required assistance with ambulation.  No numbness or tingling in the legs.  She denies other head or neck injury, back pain.  No treatments prior to arrival.      Past Medical History:  Diagnosis Date  . Allergy   . Arthritis   . Breast CA ([place])    NO BP OR STICKS IN LEFT ARM  . CAD (coronary artery disease)   . Cancer ([place])   . Cardiomyopathy ([place])    improved  . Cataract   . Diverticulosis   . Eccrine porocarcinoma of skin   . History of chemotherapy   . History of radiation therapy 1994   left chest wall  . Hyperlipidemia   . Hypertension   . Iron malabsorption 09/03/2017  . Osteoporosis   . Ovarian ca ([place])   . Wears dentures    full bottom-partial top  . Wears glasses     Patient Active Problem List   Diagnosis Date Noted  . Iron deficiency anemia due to chronic blood loss 09/03/2017  . Iron malabsorption 09/03/2017  . Breast cancer screening, high risk patient 04/21/2016  . Ascending aortic aneurysm ([place]) 10/04/2015  . Lymphedema of upper extremity following lymphadenectomy 04/19/2015  . Thrombocytopenia ([place]) 04/19/2015  . Absolute anemia 04/19/2015  . Ovarian cancer ([place]) 04/19/2015  . Unilateral vocal cord paralysis 04/19/2015  . Portacath in place  04/19/2015  . Ovarian epithelial cancer ([place]) 09/04/2014  . LBBB (left bundle branch block) 01/12/2014  . 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 TENDINITIS, RIGHT WRIST 11/12/2010  . SYNCOPE 09/23/2010  . BENIGN NEOPLASM SKIN OTHER&UNSPEC PARTS FACE 08/06/2010  . BACK PAIN 07/03/2010  . Hyperlipidemia 01/15/2010  . Congestive dilated cardiomyopathy ([place]) 01/15/2010  . Allergic rhinitis 08/22/2009  . OSTEOPOROSIS 02/16/2008  . COPD (chronic obstructive pulmonary disease) ([place]) 02/04/2008  . Essential hypertension 07/13/2007  . Coronary atherosclerosis 07/13/2007  . BREAST CANCER, HX OF 07/13/2007  . COLONIC POLYPS, HX OF 07/13/2007    Past Surgical History:  Procedure Laterality Date  . ABDOMINAL HYSTERECTOMY  2004  . BREAST SURGERY    . CHOLECYSTECTOMY  1993  . EYE SURGERY     CATARACTS  . left breast mastectomy  1994   T3 N1 Mx  . MASS EXCISION Left 05/09/2014   Procedure: EXCISION OF NODULE LEFT CHEST ;  Surgeon: Pedro Earls, MD;  Location: Agency;  Service: General;  Laterality: Left;  Marland Kitchen MODIFIED RADICAL MASTECTOMY W/ AXILLARY LYMPH NODE DISSECTION W/ PECTORALIS MINOR EXCISION  1994   Left  . right leg  1990   fx  . SHOULDER ARTHROSCOPY    . [place]   replacement-lt  . SKIN BIOPSY  04/27/2011   left hand, forearm and posterior arm    OB History    No data available       Home Medications    Prior to Admission medications   Medication Sig Start Date End Date Taking? Authorizing Provider  amoxicillin (AMOXIL) 500 MG capsule TAKE 4 CAPSULES 1 HOUR PRIOR TO DENTAL APPOINTMENT 09/16/17   [provider]  aspirin EC 81 MG tablet Take 81 mg by mouth at bedtime.     [provider]  calcium carbonate (TUMS - DOSED IN MG ELEMENTAL CALCIUM) 500 MG chewable tablet Chew 2 tablets by mouth daily with supper.    [provider]  carvedilol (COREG) 25 MG tablet TAKE 1 TABLET TWICE A DAY WITH FOOD 05/10/17    Marletta Lor, MD  Cholecalciferol (VITAMIN D) 2000 UNITS tablet Take 2,000 Units by mouth every morning.     [provider]  Coenzyme Q10 (CO Q 10) 100 MG CAPS Take 1 capsule by mouth every morning.     [provider]  denosumab (PROLIA) 60 MG/ML SOLN injection Inject 60 mg into the skin every 6 (six) months. Administer in upper arm, thigh, or abdomen    [provider]  fluticasone (FLONASE) 50 MCG/ACT nasal spray SHAKE LIQUID AND USE 2 SPRAYS IN EACH NOSTRIL DAILY Patient not taking: Reported on 08/20/2017 09/15/16   Marletta Lor, MD  lisinopril-hydrochlorothiazide (PRINZIDE,ZESTORETIC) 20-12.5 MG tablet TAKE 1 TABLET EVERY DAY 10/04/17   Marletta Lor, MD  loperamide (IMODIUM) 2 MG capsule Take 1 capsule (2 mg total) by mouth as needed for diarrhea or loose stools. 01/24/13   Gordy Levan, MD  megestrol (MEGACE ES) 625 MG/5ML suspension Take 5 mLs (625 mg total) by mouth daily. 09/02/17   Volanda Napoleon, MD  ondansetron (ZOFRAN) 4 MG tablet Take 1 tablet (4 mg total) by mouth every 6 (six) hours. Patient not taking: Reported on 08/20/2017 11/28/16   Noe Gens, PA-C  pantoprazole (PROTONIX) 40 MG tablet TAKE 1 TABLET BY MOUTH EVERY DAY Patient not taking: Reported on 08/20/2017 09/16/16   Marletta Lor, MD  simvastatin (ZOCOR) 40 MG tablet TAKE 1 TABLET AT BEDTIME 09/28/17   Marletta Lor, MD    Family History Family History  Problem Relation Age of Onset  . Breast cancer Sister        dx in her 66s  . Heart disease Sister   . Heart disease Father   . Heart disease Mother   . Stroke Mother   . Prostate cancer Maternal Uncle   . Stroke Maternal Grandmother   . Testicular cancer Maternal Grandfather   . Stroke Maternal Grandfather   . Stroke Paternal Grandmother   . Heart disease Paternal Grandfather   . Breast cancer Sister 19       BRCA2+  . Breast cancer Sister 23  . Prostate cancer Maternal Uncle   . Breast  cancer Other        maternal great aunt dx later in life    Social History Social History   Tobacco Use  . Smoking status: Never Smoker  . Smokeless tobacco: Never Used  Substance Use Topics  . Alcohol use: No  . Drug use: No     Allergies   Ibuprofen and Morphine   Review of Systems Review of Systems  Constitutional: Negative for fever.  HENT: Negative for rhinorrhea and sore throat.   Eyes: Negative for redness.  Respiratory: Negative for  cough.   Cardiovascular: Negative for chest pain.  Gastrointestinal: Negative for abdominal pain, diarrhea, nausea and vomiting.  Genitourinary: Negative for dysuria.  Musculoskeletal: Positive for arthralgias, gait problem and myalgias.  Skin: Negative for rash.  Neurological: Negative for headaches.     Physical Exam Updated Vital Signs BP (!) 155/59 (BP Location: Right Arm)   Pulse (!) 108   Temp 97.7 F (36.5 C) (Oral)   Resp 18   Ht 5' 3"  (1.6 m)   Wt 44.5 kg (98 lb)   SpO2 100%   BMI 17.36 kg/m   Physical Exam  Constitutional: She appears well-developed and well-nourished.  HENT:  Head: Normocephalic and atraumatic.  Eyes: Conjunctivae are normal. Right eye exhibits no discharge. Left eye exhibits no discharge.  Neck: Normal range of motion. Neck supple.  Cardiovascular: Normal rate, regular rhythm and normal heart sounds.  Pulses:      Dorsalis pedis pulses are 2+ on the right side, and 2+ on the left side.  Pulmonary/Chest: Effort normal and breath sounds normal.  Abdominal: Soft. There is no tenderness.  Musculoskeletal:       Right hip: She exhibits decreased range of motion, tenderness (posterior) and bony tenderness.       Right knee: Normal.       Right ankle: Normal.       Thoracic back: Normal.       Lumbar back: Normal.       Right upper leg: Normal.       Right lower leg: Normal.  Neurological: She is alert.  Skin: Skin is warm and dry.  Psychiatric: She has a normal mood and affect.  Nursing  note and vitals reviewed.    ED Treatments / Results  Labs (all labs ordered are listed, but only abnormal results are displayed) Labs Reviewed - No data to display   Radiology Dg Hip Unilat  With Pelvis 2-3 Views Right  Result Date: 10/08/2017 CLINICAL DATA:  Right hip pain after fall. EXAM: DG HIP (WITH OR WITHOUT PELVIS) 2-3V RIGHT COMPARISON:  CT abdomen pelvis dated May 02, 2013. FINDINGS: No acute fracture or malalignment. Mild bilateral hip joint space narrowing. The pubic symphysis and sacroiliac joints are intact. Diffuse osteopenia. Multiple surgical clips in the pelvis. Atherosclerotic vascular calcifications. IMPRESSION: No acute osseous abnormality. If occult hip fracture is suspected or if the patient is unable to bear weight, MRI is the preferred modality for further evaluation. Electronically Signed   By: Titus Dubin M.D.   On: 10/08/2017 09:24    Procedures Procedures (including critical care time)  Medications Ordered in ED Medications - No data to display   Initial Impression / Assessment and Plan / ED Course  I have reviewed the triage vital signs and the nursing notes.  Pertinent labs & imaging results that were available during my care of the patient were reviewed by me and considered in my medical decision making (see chart for details).     Patient seen and examined. X-ray pending.   Vital signs reviewed and are as follows: BP (!) 155/59 (BP Location: Right Arm)   Pulse (!) 108   Temp 97.7 F (36.5 C) (Oral)   Resp 18   Ht 5' 3"  (1.6 m)   Wt 44.5 kg (98 lb)   SpO2 100%   BMI 17.36 kg/m   Patient discussed with and seen by Dr. Tamera Punt.   10:36 AM Patient ambulated independently to restroom but with discomfort.  She is comfortable discharged home.  Family member will help watch her.  Encouraged use of Tylenol and use of her home walker.  Patient is to follow-up with her PCP if she has continued to have guilty walking the next 5 days.  Encouraged  return to the emergency department with inability to walk or new symptoms.  Patient and family verbalized understanding and agree.    Final Clinical Impressions(s) / ED Diagnoses   Final diagnoses:  Posterior pain of hip, right   Patient with posterior hip pain after fall into a recliner.  Low concern for occult fracture.  Patient is ambulatory with discomfort, but is weightbearing.  Imaging is negative.  Lower extremity is neurovascularly intact.  ED Discharge Orders    None       Carlisle Cater, PA-C 10/08/17 1042    Malvin Johns, MD 10/08/17 1158

## 2017-10-08 NOTE — ED Triage Notes (Signed)
Pt reports "sitting down hard" on the floor last night in an attempt to not fall. C/o R hip pain.

## 2017-10-08 NOTE — Discharge Instructions (Signed)
Please read and follow all provided instructions.  Your diagnoses today include:  1. Posterior pain of hip, right     Tests performed today include:  An x-ray of the affected area - does NOT show any broken bones  Vital signs. See below for your results today.   Medications prescribed:   None  Take any prescribed medications only as directed.  Home care instructions:   Follow any educational materials contained in this packet  Use walker at home to help you get around and to help prevent falling  Follow R.I.C.E. Protocol:  R - rest your injury   I  - use ice on injury without applying directly to skin  C - compress injury with bandage or splint  E - elevate the injury as much as possible  Follow-up instructions: Please follow-up with your primary care provider if you continue to have significant pain in 5 days. In this case you may have a more severe injury that requires further care.   Return instructions:   Please return if your toes or feet are numb or tingling, appear gray or blue, or you have severe pain (also elevate the leg and loosen splint or wrap if you were given one)  Please return to the Emergency Department if you experience worsening symptoms.   Please return if you have any other emergent concerns.  Additional Information:  Your vital signs today were: BP (!) 155/59 (BP Location: Right Arm)    Pulse (!) 108    Temp 97.7 F (36.5 C) (Oral)    Resp 18    Ht 5\' 3"  (1.6 m)    Wt 44.5 kg (98 lb)    SpO2 100%    BMI 17.36 kg/m  If your blood pressure (BP) was elevated above 135/85 this visit, please have this repeated by your doctor within one month. --------------

## 2017-10-11 ENCOUNTER — Emergency Department (HOSPITAL_COMMUNITY): Payer: Medicare HMO

## 2017-10-11 ENCOUNTER — Other Ambulatory Visit: Payer: Self-pay

## 2017-10-11 ENCOUNTER — Inpatient Hospital Stay (HOSPITAL_COMMUNITY)
Admission: EM | Admit: 2017-10-11 | Discharge: 2017-10-21 | DRG: 551 | Disposition: A | Discharge status: Skilled Nursing Facility | Payer: Medicare HMO | Attending: Internal Medicine | Admitting: Internal Medicine

## 2017-10-11 ENCOUNTER — Encounter (HOSPITAL_COMMUNITY): Payer: Self-pay | Admitting: Emergency Medicine

## 2017-10-11 DIAGNOSIS — M549 Dorsalgia, unspecified: Secondary | ICD-10-CM | POA: Diagnosis not present

## 2017-10-11 DIAGNOSIS — S12100A Unspecified displaced fracture of second cervical vertebra, initial encounter for closed fracture: Secondary | ICD-10-CM | POA: Diagnosis not present

## 2017-10-11 DIAGNOSIS — I972 Postmastectomy lymphedema syndrome: Secondary | ICD-10-CM | POA: Diagnosis not present

## 2017-10-11 DIAGNOSIS — I5042 Chronic combined systolic (congestive) and diastolic (congestive) heart failure: Secondary | ICD-10-CM | POA: Diagnosis not present

## 2017-10-11 DIAGNOSIS — I10 Essential (primary) hypertension: Secondary | ICD-10-CM

## 2017-10-11 DIAGNOSIS — I700 Atherosclerosis of aorta: Secondary | ICD-10-CM | POA: Diagnosis present

## 2017-10-11 DIAGNOSIS — J9602 Acute respiratory failure with hypercapnia: Secondary | ICD-10-CM | POA: Diagnosis not present

## 2017-10-11 DIAGNOSIS — Z90710 Acquired absence of both cervix and uterus: Secondary | ICD-10-CM

## 2017-10-11 DIAGNOSIS — R000 Tachycardia, unspecified: Secondary | ICD-10-CM

## 2017-10-11 DIAGNOSIS — I471 Supraventricular tachycardia: Secondary | ICD-10-CM

## 2017-10-11 DIAGNOSIS — Z79818 Long term (current) use of other agents affecting estrogen receptors and estrogen levels: Secondary | ICD-10-CM

## 2017-10-11 DIAGNOSIS — S32009A Unspecified fracture of unspecified lumbar vertebra, initial encounter for closed fracture: Secondary | ICD-10-CM

## 2017-10-11 DIAGNOSIS — K909 Intestinal malabsorption, unspecified: Secondary | ICD-10-CM | POA: Diagnosis present

## 2017-10-11 DIAGNOSIS — M4854XA Collapsed vertebra, not elsewhere classified, thoracic region, initial encounter for fracture: Secondary | ICD-10-CM | POA: Diagnosis not present

## 2017-10-11 DIAGNOSIS — Z9221 Personal history of antineoplastic chemotherapy: Secondary | ICD-10-CM | POA: Diagnosis not present

## 2017-10-11 DIAGNOSIS — S32119A Unspecified Zone I fracture of sacrum, initial encounter for closed fracture: Secondary | ICD-10-CM | POA: Diagnosis present

## 2017-10-11 DIAGNOSIS — R64 Cachexia: Secondary | ICD-10-CM | POA: Diagnosis not present

## 2017-10-11 DIAGNOSIS — R1312 Dysphagia, oropharyngeal phase: Secondary | ICD-10-CM | POA: Diagnosis not present

## 2017-10-11 DIAGNOSIS — R778 Other specified abnormalities of plasma proteins: Secondary | ICD-10-CM

## 2017-10-11 DIAGNOSIS — I472 Ventricular tachycardia: Secondary | ICD-10-CM | POA: Diagnosis present

## 2017-10-11 DIAGNOSIS — I2510 Atherosclerotic heart disease of native coronary artery without angina pectoris: Secondary | ICD-10-CM | POA: Diagnosis present

## 2017-10-11 DIAGNOSIS — Z8543 Personal history of malignant neoplasm of ovary: Secondary | ICD-10-CM

## 2017-10-11 DIAGNOSIS — I4892 Unspecified atrial flutter: Secondary | ICD-10-CM | POA: Diagnosis present

## 2017-10-11 DIAGNOSIS — R52 Pain, unspecified: Secondary | ICD-10-CM | POA: Diagnosis not present

## 2017-10-11 DIAGNOSIS — J3801 Paralysis of vocal cords and larynx, unilateral: Secondary | ICD-10-CM | POA: Diagnosis not present

## 2017-10-11 DIAGNOSIS — Z96612 Presence of left artificial shoulder joint: Secondary | ICD-10-CM | POA: Diagnosis present

## 2017-10-11 DIAGNOSIS — J9589 Other postprocedural complications and disorders of respiratory system, not elsewhere classified: Secondary | ICD-10-CM | POA: Diagnosis not present

## 2017-10-11 DIAGNOSIS — F419 Anxiety disorder, unspecified: Secondary | ICD-10-CM | POA: Diagnosis present

## 2017-10-11 DIAGNOSIS — I5043 Acute on chronic combined systolic (congestive) and diastolic (congestive) heart failure: Secondary | ICD-10-CM | POA: Diagnosis present

## 2017-10-11 DIAGNOSIS — I482 Chronic atrial fibrillation: Secondary | ICD-10-CM | POA: Diagnosis not present

## 2017-10-11 DIAGNOSIS — J9622 Acute and chronic respiratory failure with hypercapnia: Secondary | ICD-10-CM | POA: Diagnosis not present

## 2017-10-11 DIAGNOSIS — I110 Hypertensive heart disease with heart failure: Secondary | ICD-10-CM | POA: Diagnosis present

## 2017-10-11 DIAGNOSIS — I712 Thoracic aortic aneurysm, without rupture: Secondary | ICD-10-CM | POA: Diagnosis not present

## 2017-10-11 DIAGNOSIS — R748 Abnormal levels of other serum enzymes: Secondary | ICD-10-CM | POA: Diagnosis not present

## 2017-10-11 DIAGNOSIS — I5022 Chronic systolic (congestive) heart failure: Secondary | ICD-10-CM | POA: Diagnosis not present

## 2017-10-11 DIAGNOSIS — M545 Low back pain: Secondary | ICD-10-CM | POA: Diagnosis not present

## 2017-10-11 DIAGNOSIS — R0603 Acute respiratory distress: Secondary | ICD-10-CM

## 2017-10-11 DIAGNOSIS — C4499 Other specified malignant neoplasm of skin, unspecified: Secondary | ICD-10-CM | POA: Diagnosis not present

## 2017-10-11 DIAGNOSIS — R109 Unspecified abdominal pain: Secondary | ICD-10-CM

## 2017-10-11 DIAGNOSIS — Z853 Personal history of malignant neoplasm of breast: Secondary | ICD-10-CM

## 2017-10-11 DIAGNOSIS — N179 Acute kidney failure, unspecified: Secondary | ICD-10-CM

## 2017-10-11 DIAGNOSIS — S299XXA Unspecified injury of thorax, initial encounter: Secondary | ICD-10-CM | POA: Diagnosis not present

## 2017-10-11 DIAGNOSIS — I959 Hypotension, unspecified: Secondary | ICD-10-CM | POA: Diagnosis not present

## 2017-10-11 DIAGNOSIS — D638 Anemia in other chronic diseases classified elsewhere: Secondary | ICD-10-CM | POA: Diagnosis present

## 2017-10-11 DIAGNOSIS — I361 Nonrheumatic tricuspid (valve) insufficiency: Secondary | ICD-10-CM | POA: Diagnosis not present

## 2017-10-11 DIAGNOSIS — S3210XA Unspecified fracture of sacrum, initial encounter for closed fracture: Secondary | ICD-10-CM | POA: Diagnosis not present

## 2017-10-11 DIAGNOSIS — R69 Illness, unspecified: Secondary | ICD-10-CM | POA: Diagnosis not present

## 2017-10-11 DIAGNOSIS — Z886 Allergy status to analgesic agent status: Secondary | ICD-10-CM

## 2017-10-11 DIAGNOSIS — R7989 Other specified abnormal findings of blood chemistry: Secondary | ICD-10-CM

## 2017-10-11 DIAGNOSIS — M810 Age-related osteoporosis without current pathological fracture: Secondary | ICD-10-CM | POA: Diagnosis present

## 2017-10-11 DIAGNOSIS — E874 Mixed disorder of acid-base balance: Secondary | ICD-10-CM | POA: Diagnosis present

## 2017-10-11 DIAGNOSIS — I4891 Unspecified atrial fibrillation: Secondary | ICD-10-CM

## 2017-10-11 DIAGNOSIS — R2689 Other abnormalities of gait and mobility: Secondary | ICD-10-CM | POA: Diagnosis not present

## 2017-10-11 DIAGNOSIS — R0602 Shortness of breath: Secondary | ICD-10-CM

## 2017-10-11 DIAGNOSIS — E785 Hyperlipidemia, unspecified: Secondary | ICD-10-CM | POA: Diagnosis present

## 2017-10-11 DIAGNOSIS — Z79899 Other long term (current) drug therapy: Secondary | ICD-10-CM

## 2017-10-11 DIAGNOSIS — I4719 Other supraventricular tachycardia: Secondary | ICD-10-CM

## 2017-10-11 DIAGNOSIS — I428 Other cardiomyopathies: Secondary | ICD-10-CM | POA: Diagnosis present

## 2017-10-11 DIAGNOSIS — I447 Left bundle-branch block, unspecified: Secondary | ICD-10-CM | POA: Diagnosis present

## 2017-10-11 DIAGNOSIS — R9431 Abnormal electrocardiogram [ECG] [EKG]: Secondary | ICD-10-CM | POA: Diagnosis not present

## 2017-10-11 DIAGNOSIS — J9621 Acute and chronic respiratory failure with hypoxia: Secondary | ICD-10-CM | POA: Diagnosis not present

## 2017-10-11 DIAGNOSIS — Z6820 Body mass index (BMI) 20.0-20.9, adult: Secondary | ICD-10-CM

## 2017-10-11 DIAGNOSIS — Z885 Allergy status to narcotic agent status: Secondary | ICD-10-CM

## 2017-10-11 DIAGNOSIS — S32059A Unspecified fracture of fifth lumbar vertebra, initial encounter for closed fracture: Principal | ICD-10-CM | POA: Diagnosis present

## 2017-10-11 DIAGNOSIS — R911 Solitary pulmonary nodule: Secondary | ICD-10-CM

## 2017-10-11 DIAGNOSIS — Z4682 Encounter for fitting and adjustment of non-vascular catheter: Secondary | ICD-10-CM | POA: Diagnosis not present

## 2017-10-11 DIAGNOSIS — I340 Nonrheumatic mitral (valve) insufficiency: Secondary | ICD-10-CM | POA: Diagnosis present

## 2017-10-11 DIAGNOSIS — M5489 Other dorsalgia: Secondary | ICD-10-CM | POA: Diagnosis not present

## 2017-10-11 DIAGNOSIS — I509 Heart failure, unspecified: Secondary | ICD-10-CM

## 2017-10-11 DIAGNOSIS — I7121 Aneurysm of the ascending aorta, without rupture: Secondary | ICD-10-CM | POA: Diagnosis present

## 2017-10-11 DIAGNOSIS — L8990 Pressure ulcer of unspecified site, unspecified stage: Secondary | ICD-10-CM

## 2017-10-11 DIAGNOSIS — Z01818 Encounter for other preprocedural examination: Secondary | ICD-10-CM

## 2017-10-11 DIAGNOSIS — G250 Essential tremor: Secondary | ICD-10-CM | POA: Diagnosis present

## 2017-10-11 DIAGNOSIS — M5136 Other intervertebral disc degeneration, lumbar region: Secondary | ICD-10-CM | POA: Diagnosis present

## 2017-10-11 DIAGNOSIS — Z9012 Acquired absence of left breast and nipple: Secondary | ICD-10-CM

## 2017-10-11 DIAGNOSIS — J449 Chronic obstructive pulmonary disease, unspecified: Secondary | ICD-10-CM | POA: Diagnosis present

## 2017-10-11 DIAGNOSIS — D500 Iron deficiency anemia secondary to blood loss (chronic): Secondary | ICD-10-CM | POA: Diagnosis present

## 2017-10-11 DIAGNOSIS — J90 Pleural effusion, not elsewhere classified: Secondary | ICD-10-CM | POA: Diagnosis not present

## 2017-10-11 DIAGNOSIS — Z8249 Family history of ischemic heart disease and other diseases of the circulatory system: Secondary | ICD-10-CM

## 2017-10-11 DIAGNOSIS — J9601 Acute respiratory failure with hypoxia: Secondary | ICD-10-CM | POA: Diagnosis not present

## 2017-10-11 DIAGNOSIS — Z7982 Long term (current) use of aspirin: Secondary | ICD-10-CM

## 2017-10-11 DIAGNOSIS — I2699 Other pulmonary embolism without acute cor pulmonale: Secondary | ICD-10-CM | POA: Diagnosis not present

## 2017-10-11 DIAGNOSIS — Z923 Personal history of irradiation: Secondary | ICD-10-CM

## 2017-10-11 HISTORY — DX: Unspecified fracture of sacrum, initial encounter for closed fracture: S32.10XA

## 2017-10-11 LAB — TROPONIN I
TROPONIN I: 1.53 ng/mL — AB (ref <0.03)
Troponin I: 1.66 ng/mL (ref <0.03)
Troponin I: 1.56 ng/mL (ref <0.03)

## 2017-10-11 LAB — BASIC METABOLIC PANEL
Anion gap: 10 (ref 5–15)
BUN: 26 mg/dL — AB (ref 6–20)
CALCIUM: 8.1 mg/dL — AB (ref 8.9–10.3)
CO2: 21 mmol/L — ABNORMAL LOW (ref 22–32)
CREATININE: 1.09 mg/dL — AB (ref 0.44–1.00)
Chloride: 103 mmol/L (ref 101–111)
GFR calc Af Amer: 52 mL/min — ABNORMAL LOW (ref >60)
GFR calc non Af Amer: 45 mL/min — ABNORMAL LOW (ref >60)
GLUCOSE: 142 mg/dL — AB (ref 65–99)
Potassium: 3.9 mmol/L (ref 3.5–5.1)
Sodium: 134 mmol/L — ABNORMAL LOW (ref 135–145)

## 2017-10-11 LAB — CBC
HCT: 29.4 % — ABNORMAL LOW (ref 36.0–46.0)
Hemoglobin: 9.5 g/dL — ABNORMAL LOW (ref 12.0–15.0)
MCH: 28.1 pg (ref 26.0–34.0)
MCHC: 32.3 g/dL (ref 30.0–36.0)
MCV: 87 fL (ref 78.0–100.0)
PLATELETS: 182 10*3/uL (ref 150–400)
RBC: 3.38 MIL/uL — ABNORMAL LOW (ref 3.87–5.11)
RDW: 18.3 % — AB (ref 11.5–15.5)
WBC: 7.4 10*3/uL (ref 4.0–10.5)

## 2017-10-11 LAB — MAGNESIUM: Magnesium: 2 mg/dL (ref 1.7–2.4)

## 2017-10-11 LAB — TSH: TSH: 1.661 u[IU]/mL (ref 0.350–4.500)

## 2017-10-11 MED ORDER — HEPARIN SODIUM (PORCINE) 5000 UNIT/ML IJ SOLN: 5000.0000 [IU] | Freq: Three times a day (TID) | INTRAMUSCULAR | Status: DC

## 2017-10-11 MED ORDER — BISACODYL 10 MG RE SUPP
10.0000 mg | Freq: Every day | RECTAL | Status: DC | PRN
  Administered 2017-10-18: 10 mg via RECTAL
  Filled 2017-10-11: qty 1

## 2017-10-11 MED ORDER — LISINOPRIL 20 MG PO TABS
20.0000 mg | ORAL_TABLET | Freq: Every day | ORAL | Status: DC
  Administered 2017-10-11: 20 mg via ORAL

## 2017-10-11 MED ORDER — HYDROMORPHONE HCL 1 MG/ML IJ SOLN
0.5000 mg | INTRAMUSCULAR | Status: DC | PRN
  Administered 2017-10-14 – 2017-10-15 (×5): 0.5 mg via INTRAVENOUS
  Filled 2017-10-11 (×5): qty 0.5

## 2017-10-11 MED ORDER — KETOROLAC TROMETHAMINE 15 MG/ML IJ SOLN
15.0000 mg | Freq: Four times a day (QID) | INTRAMUSCULAR | Status: DC | PRN
  Administered 2017-10-11 – 2017-10-13 (×2): 15 mg via INTRAVENOUS
  Filled 2017-10-11 (×2): qty 1

## 2017-10-11 MED ORDER — SENNOSIDES-DOCUSATE SODIUM 8.6-50 MG PO TABS: 1.0000 | ORAL_TABLET | Freq: Every evening | ORAL | Status: DC | PRN

## 2017-10-11 MED ORDER — ONDANSETRON HCL 4 MG PO TABS: 4.0000 mg | ORAL_TABLET | Freq: Four times a day (QID) | ORAL | Status: DC | PRN

## 2017-10-11 MED ORDER — LISINOPRIL 20 MG PO TABS
20.0000 mg | ORAL_TABLET | Freq: Every day | ORAL | Status: DC
  Filled 2017-10-11: qty 1

## 2017-10-11 MED ORDER — HYDROCHLOROTHIAZIDE 12.5 MG PO CAPS
12.5000 mg | ORAL_CAPSULE | Freq: Every day | ORAL | Status: DC
  Filled 2017-10-11: qty 1

## 2017-10-11 MED ORDER — PANTOPRAZOLE SODIUM 40 MG PO TBEC: 40.0000 mg | DELAYED_RELEASE_TABLET | Freq: Every day | ORAL | Status: DC

## 2017-10-11 MED ORDER — METOPROLOL TARTRATE 5 MG/5ML IV SOLN: 5.0000 mg | Freq: Once | INTRAVENOUS | Status: DC

## 2017-10-11 MED ORDER — ASPIRIN EC 81 MG PO TBEC
81.0000 mg | DELAYED_RELEASE_TABLET | Freq: Every day | ORAL | Status: DC
  Administered 2017-10-11 – 2017-10-20 (×8): 81 mg via ORAL
  Filled 2017-10-11 (×8): qty 1

## 2017-10-11 MED ORDER — CALCIUM CARBONATE ANTACID 500 MG PO CHEW: 2.0000 | CHEWABLE_TABLET | Freq: Every day | ORAL | Status: DC

## 2017-10-11 MED ORDER — MEGESTROL ACETATE 625 MG/5ML PO SUSP: 625.0000 mg | Freq: Every day | ORAL | Status: DC

## 2017-10-11 MED ORDER — ONDANSETRON HCL 4 MG PO TABS
4.0000 mg | ORAL_TABLET | Freq: Four times a day (QID) | ORAL | Status: DC
Start: 2017-10-11 — End: 2017-10-11

## 2017-10-11 MED ORDER — MEGESTROL ACETATE 400 MG/10ML PO SUSP
600.0000 mg | Freq: Every day | ORAL | Status: DC
  Administered 2017-10-12: 600 mg via ORAL
  Filled 2017-10-11 (×2): qty 15

## 2017-10-11 MED ORDER — HEPARIN (PORCINE) IN NACL 100-0.45 UNIT/ML-% IJ SOLN
800.0000 [IU]/h | INTRAMUSCULAR | Status: DC
  Administered 2017-10-11: 650 [IU]/h via INTRAVENOUS
  Filled 2017-10-11: qty 250

## 2017-10-11 MED ORDER — LISINOPRIL-HYDROCHLOROTHIAZIDE 20-12.5 MG PO TABS: 1.0000 | ORAL_TABLET | Freq: Every day | ORAL | Status: DC

## 2017-10-11 MED ORDER — HYDROCODONE-ACETAMINOPHEN 5-325 MG PO TABS
1.0000 | ORAL_TABLET | Freq: Once | ORAL | Status: AC
  Administered 2017-10-11: 1 via ORAL
  Filled 2017-10-11: qty 1

## 2017-10-11 MED ORDER — ACETAMINOPHEN 325 MG PO TABS
650.0000 mg | ORAL_TABLET | Freq: Four times a day (QID) | ORAL | Status: DC | PRN
  Administered 2017-10-16 – 2017-10-21 (×5): 650 mg via ORAL
  Filled 2017-10-11 (×5): qty 2

## 2017-10-11 MED ORDER — HYDROCODONE-ACETAMINOPHEN 5-325 MG PO TABS
1.0000 | ORAL_TABLET | ORAL | Status: DC | PRN
  Administered 2017-10-11 – 2017-10-12 (×2): 1 via ORAL
  Administered 2017-10-12: 2 via ORAL
  Administered 2017-10-13 (×3): 1 via ORAL
  Administered 2017-10-15: 2 via ORAL
  Administered 2017-10-15: 1 via ORAL
  Filled 2017-10-11 (×3): qty 2
  Filled 2017-10-11 (×5): qty 1

## 2017-10-11 MED ORDER — LOPERAMIDE HCL 2 MG PO CAPS: 2.0000 mg | ORAL_CAPSULE | ORAL | Status: DC | PRN

## 2017-10-11 MED ORDER — ACETAMINOPHEN 650 MG RE SUPP: 650.0000 mg | Freq: Four times a day (QID) | RECTAL | Status: DC | PRN

## 2017-10-11 MED ORDER — SODIUM CHLORIDE 0.9 % IV SOLN
INTRAVENOUS | Status: DC
  Administered 2017-10-11 – 2017-10-16 (×5): via INTRAVENOUS

## 2017-10-11 MED ORDER — SODIUM CHLORIDE 0.9 % IV BOLUS (SEPSIS)
500.0000 mL | Freq: Once | INTRAVENOUS | Status: AC
  Administered 2017-10-11: 500 mL via INTRAVENOUS

## 2017-10-11 MED ORDER — SIMVASTATIN 40 MG PO TABS
40.0000 mg | ORAL_TABLET | Freq: Every day | ORAL | Status: DC
  Administered 2017-10-11 – 2017-10-18 (×6): 40 mg via ORAL
  Filled 2017-10-11 (×6): qty 1

## 2017-10-11 MED ORDER — HYDROCHLOROTHIAZIDE 12.5 MG PO CAPS
12.5000 mg | ORAL_CAPSULE | Freq: Every day | ORAL | Status: DC
  Administered 2017-10-11: 12.5 mg via ORAL

## 2017-10-11 MED ORDER — HEPARIN BOLUS VIA INFUSION
2500.0000 [IU] | Freq: Once | INTRAVENOUS | Status: AC
  Administered 2017-10-11: 2500 [IU] via INTRAVENOUS
  Filled 2017-10-11: qty 2500

## 2017-10-11 MED ORDER — VITAMIN D 50 MCG (2000 UT) PO TABS: 2000.0000 [IU] | ORAL_TABLET | Freq: Every morning | ORAL | Status: DC

## 2017-10-11 MED ORDER — FENTANYL CITRATE (PF) 100 MCG/2ML IJ SOLN
25.0000 ug | Freq: Once | INTRAMUSCULAR | Status: AC
  Administered 2017-10-11: 25 ug via INTRAVENOUS
  Filled 2017-10-11: qty 2

## 2017-10-11 MED ORDER — METOPROLOL TARTRATE 5 MG/5ML IV SOLN: 2.5000 mg | Freq: Once | INTRAVENOUS | Status: DC

## 2017-10-11 MED ORDER — ONDANSETRON HCL 4 MG/2ML IJ SOLN
4.0000 mg | Freq: Four times a day (QID) | INTRAMUSCULAR | Status: DC | PRN
  Administered 2017-10-14: 4 mg via INTRAVENOUS
  Filled 2017-10-11: qty 2

## 2017-10-11 MED ORDER — CARVEDILOL 12.5 MG PO TABS
25.0000 mg | ORAL_TABLET | Freq: Two times a day (BID) | ORAL | Status: DC
  Administered 2017-10-11 – 2017-10-12 (×2): 25 mg via ORAL
  Filled 2017-10-11 (×2): qty 2

## 2017-10-11 MED ORDER — CO Q 10 100 MG PO CAPS: 1.0000 | ORAL_CAPSULE | Freq: Every morning | ORAL | Status: DC

## 2017-10-11 NOTE — ED Notes (Signed)
Notified Marily Memos, MD of elevated troponin

## 2017-10-11 NOTE — ED Notes (Signed)
EKG completed given to EDP.  

## 2017-10-11 NOTE — Consult Note (Signed)
Cardiology Consultation:   Patient ID: Judy Herrera; [id number]; December 06, 1930   Admit date: 10/11/2017 Date of Consult: 10/11/2017  Primary Care Provider: Marletta Lor, MD Primary Cardiologist: No primary care provider on file. Dr. Stanford Breed in 2016  Primary Electrophysiologist:  NA   Patient Profile:   Judy Herrera is a 83 y.o. female with a hx of NICM, CAD with cath in 2006 with 60% OM, thoracic aorta of 4 cm in 2016, short burst on NSVT on monitor  who is being seen today for the evaluation of a fib at the request of Dr. Marily Memos.  History of Present Illness:   Judy Herrera has a hx of NICM, CAD with cath in 2006 with 60% OM, thoracic aorta of 4 cm in 2016, short burst on NSVT on monitor as outpt.  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 Anemia secondary to chronic bleeding from malignant wounds.  Last echo 2016 with EF 50-55%, G1DD, mild AR, severe post MAC, with mild regurgitation.  PA pk pressure 32 mmHg.   She now presents to  ER today after sitting hard yesterday.  Was seen in ER yesterday and Xrays were normal and she went home.  Today she was unable to walk so came to ER.  She was found to be in tachycardic in possible a flutter.  Rate 117.        EKG:  The EKG was personally reviewed and demonstrates:  Possible a flutter with RVR LBBB Telemetry:  Telemetry was personally reviewed and demonstrates:  A flutter with RVR  Vs. Sinus tach.  Troponin 1.53  Na 134, K+ 3.9, cr 1.09,  Hgb 9.5, Hct 29.4  plts 182  TSH 1.661  CXR IMPRESSION: 1. Hyperinflation and stable cardiomegaly. 2. Question of mass the left lung apex. Further evaluation is CT of the chest is recommended. Intravenous contrast is recommended unless contraindicated. 3. These results will be called to the ordering clinician or representative by the Radiologist Assistant, and communication documented in the PACS or  zVision Dashboard.   CT lumbar spine  IMPRESSION: 1. Acute sacral insufficiency type fractures. 2. Nondisplaced right L5 transverse process fracture. 3. Chronic T12 fracture with vertebra plana configuration, retropulsion, and mild spinal stenosis. 4.  Aortic Atherosclerosis (ICD10-I70.0).  BP stable 125/59 P 121 to 102   sp02 at 87 %,   Currently no chest pain only back pain.  Has not been SOB or had chest pain at home.  On tele now appears ST   Past Medical History:  Diagnosis Date  . Allergy   . Arthritis   . Breast CA ([place])    NO BP OR STICKS IN LEFT ARM  . CAD (coronary artery disease)   . Cancer ([place])   . Cardiomyopathy ([place])    improved  . Cataract   . Diverticulosis   . Eccrine porocarcinoma of skin   . History of chemotherapy   . History of radiation therapy 1994   left chest wall  . Hyperlipidemia   . Hypertension   . Iron malabsorption 09/03/2017  . Osteoporosis   . Ovarian ca ([place])   . Wears dentures    full bottom-partial top  . Wears glasses     Past Surgical History:  Procedure Laterality Date  . ABDOMINAL HYSTERECTOMY  2004  . BREAST SURGERY    . CHOLECYSTECTOMY  1993  . EYE SURGERY  CATARACTS  . left breast mastectomy  1994   T3 N1 Mx  . MASS EXCISION Left 05/09/2014   Procedure: EXCISION OF NODULE LEFT CHEST ;  Surgeon: Pedro Earls, MD;  Location: [facility];  Service: General;  Laterality: Left;  Marland Kitchen MODIFIED RADICAL MASTECTOMY W/ AXILLARY LYMPH NODE DISSECTION W/ PECTORALIS MINOR EXCISION  1994   Left  . right leg  1990   fx  . SHOULDER ARTHROSCOPY    . [place]   replacement-lt  . SKIN BIOPSY  04/27/2011   left hand, forearm and posterior arm     Home Medications:  Prior to Admission medications   Medication Sig Start Date End Date Taking? Authorizing Provider  amoxicillin (AMOXIL) 500 MG capsule TAKE 4 CAPSULES 1 HOUR PRIOR TO DENTAL APPOINTMENT 09/16/17  Yes [provider]  denosumab  (PROLIA) 60 MG/ML SOLN injection Inject 60 mg into the skin every 6 (six) months. Administer in upper arm, thigh, or abdomen   Yes [provider]  lisinopril-hydrochlorothiazide (PRINZIDE,ZESTORETIC) 20-12.5 MG tablet TAKE 1 TABLET EVERY DAY Patient taking differently: TAKE 1 TABLET EVERY DAY AT BEDTIME 10/04/17  Yes Marletta Lor, MD  loperamide (IMODIUM) 2 MG capsule Take 1 capsule (2 mg total) by mouth as needed for diarrhea or loose stools. 01/24/13  Yes Herrera, Judy Julian, MD  megestrol (MEGACE ES) 625 MG/5ML suspension Take 5 mLs (625 mg total) by mouth daily. 09/02/17  Yes Volanda Napoleon, MD  simvastatin (ZOCOR) 40 MG tablet TAKE 1 TABLET AT BEDTIME Patient taking differently: TAKE 1 TABLET (56m)AT BEDTIME 09/28/17  Yes KMarletta Lor MD  carvedilol (COREG) 25 MG tablet TAKE 1 TABLET TWICE A DAY WITH FOOD Patient not taking: Reported on 10/11/2017 05/10/17   KMarletta Lor MD  fluticasone ([facility] 50 MCG/ACT nasal spray SHAKE LIQUID AND USE 2 SPRAYS [facility] Patient not taking: Reported on 08/20/2017 09/15/16   KMarletta Lor MD  ondansetron (ZOFRAN) 4 MG tablet Take 1 tablet (4 mg total) by mouth every 6 (six) hours. Patient not taking: Reported on 08/20/2017 11/28/16   PNoe Gens PA-C  pantoprazole (PROTONIX) 40 MG tablet TAKE 1 TABLET BY MOUTH EVERY DAY Patient not taking: Reported on 08/20/2017 09/16/16   KMarletta Lor MD    Inpatient Medications: Scheduled Meds: . aspirin EC  81 mg Oral QHS  . calcium carbonate  2 tablet Oral Q supper  . carvedilol  25 mg Oral BID WC  . Co Q 10  1 capsule Oral q morning - 10a  . heparin  5,000 Units Subcutaneous Q8H  . lisinopril-hydrochlorothiazide  1 tablet Oral Daily  . megestrol  625 mg Oral Daily  . ondansetron  4 mg Oral Q6H  . pantoprazole  40 mg Oral Daily  . simvastatin  40 mg Oral QHS  . Vitamin D  2,000 Units Oral q morning - 10a   Continuous Infusions: . sodium  chloride     PRN Meds: acetaminophen **OR** acetaminophen, bisacodyl, HYDROcodone-acetaminophen, HYDROmorphone (DILAUDID) injection, ketorolac, loperamide, ondansetron **OR** ondansetron (ZOFRAN) IV, senna-docusate  Allergies:    Allergies  Allergen Reactions  . Ibuprofen Nausea Only  . Morphine Nausea And Vomiting    Social History:   Social History   Socioeconomic History  . Marital status: Widowed    Spouse name: Not on file  . Number of children: 1  . Years of education: Not on file  . Highest education level: Not on  file  Social Needs  . Financial resource strain: Not on file  . Food insecurity - worry: Not on file  . Food insecurity - inability: Not on file  . Transportation needs - medical: Not on file  . Transportation needs - non-medical: Not on file  Occupational History  . Not on file  Tobacco Use  . Smoking status: Never Smoker  . Smokeless tobacco: Never Used  Substance and Sexual Activity  . Alcohol use: No  . Drug use: No  . Sexual activity: Not on file    Comment: didn't ask  Other Topics Concern  . Not on file  Social History Narrative  . Not on file    Family History:    Family History  Problem Relation Age of Onset  . Breast cancer Sister        dx in her 52s  . Heart disease Sister   . Heart disease Father   . Heart disease Mother   . Stroke Mother   . Prostate cancer Maternal Uncle   . Stroke Maternal Grandmother   . Testicular cancer Maternal Grandfather   . Stroke Maternal Grandfather   . Stroke Paternal Grandmother   . Heart disease Paternal Grandfather   . Breast cancer Sister 42       BRCA2+  . Breast cancer Sister 34  . Prostate cancer Maternal Uncle   . Breast cancer Other        maternal great aunt dx later in life     ROS:  Please see the history of present illness.  ROS  General:no colds or fevers, no weight changes Skin:no rashes or ulcers. + cancer on Lt arm HEENT:no blurred vision, no congestion CV:see  HPI PUL:see HPI GI:no diarrhea constipation or melena, no indigestion GU:no hematuria, no dysuria MS:no joint pain, no claudication, + back pain Neuro:no syncope, no lightheadedness Endo:no diabetes, no thyroid disease      Physical Exam/Data:   Vitals:   10/11/17 1517 10/11/17 1530 10/11/17 1545 10/11/17 1600  BP: 125/75 128/70 126/68 (!) 125/59  Pulse:  (!) 106 (!) 103 (!) 102  Resp:  (!) 28 (!) 21 (!) 24  Temp:      TempSrc:      SpO2:  92% 93% 92%  Weight:      Height:       No intake or output data in the 24 hours ending 10/11/17 1650 Filed Weights   10/11/17 0938  Weight: 98 lb (44.5 kg)   Body mass index is 17.36 kg/m.  General:  Frail white female, in no acute distress unless she moves then back pain HEENT: normal Lymph: no adenopathy Neck: no JVD Endocrine:  No thryomegaly Vascular: No carotid bruits; 1+ pedal pulses  Cardiac:  normal S1, S2; RRR; 2-3/6  murmur no gallup rub or click Lungs:  clear to auscultation bilaterally, ant. no wheezing, rhonchi or rales  Abd: soft, nontender though her back is painful with abd palpation,  no hepatomegaly  Ext: no edema Musculoskeletal:  No deformities, BUE and BLE strength normal and equal Skin: warm and dry  Neuro:  Alert and oriented X 3 MAE, follows commands, + facial symmetry, no focal abnormalities noted Psych:  Normal affect but anxious with pain   Relevant CV Studies: Echo 09/2015  Study Conclusions  - Left ventricle: Abnormal septal motion Systolic function was   normal. The estimated ejection fraction was in the range of 50%   to 55%. Doppler parameters are consistent with abnormal  left   ventricular relaxation (grade 1 diastolic dysfunction). Doppler   parameters are consistent with elevated ventricular end-diastolic   filling pressure. - Aortic valve: Not well seen likely trileaflet. There was mild   regurgitation. - Aorta: Poorly visualized - Mitral valve: Severe posterior MAC. There was mild  regurgitation. - Atrial septum: No defect or patent foramen ovale was identified. - Pulmonary arteries: PA peak pressure: 32 mm Hg (S).  Laboratory Data:  Chemistry Recent Labs  Lab 10/11/17 0956  NA 134*  K 3.9  CL 103  CO2 21*  GLUCOSE 142*  BUN 26*  CREATININE 1.09*  CALCIUM 8.1*  GFRNONAA 45*  GFRAA 52*  ANIONGAP 10    No results for input(s): PROT, ALBUMIN, AST, ALT, ALKPHOS, BILITOT in the last 168 hours. Hematology Recent Labs  Lab 10/11/17 0956  WBC 7.4  RBC 3.38*  HGB 9.5*  HCT 29.4*  MCV 87.0  MCH 28.1  MCHC 32.3  RDW 18.3*  PLT 182   Cardiac Enzymes Recent Labs  Lab 10/11/17 1504  TROPONINI 1.53*   No results for input(s): TROPIPOC in the last 168 hours.  BNPNo results for input(s): BNP, PROBNP in the last 168 hours.  DDimer No results for input(s): DDIMER in the last 168 hours.  Radiology/Studies:  Dg Chest 1 View  Addendum Date: 10/11/2017   ADDENDUM REPORT: 10/11/2017 11:56 ADDENDUM: These results were called by telephone at the time of interpretation on 10/11/2017 at 11:56 am to Dr. Brantley Stage , who verbally acknowledged these results. Electronically Signed   By: Nolon Nations M.D.   On: 10/11/2017 11:56   Result Date: 10/11/2017 CLINICAL DATA:  New onset AFib. Fell down yesterday. Lower back pain. EXAM: CHEST 1 VIEW COMPARISON:  11/28/2016 FINDINGS: Lungs are hyperinflated. Heart size is enlarged. There is increased left apical opacity, raising the question of pleural thickening or mass. Further evaluation with CT is recommended. Prominence of the right hilar region appears stable over time. Surgical clips overlie the left axilla. IMPRESSION: 1. Hyperinflation and stable cardiomegaly. 2. Question of mass the left lung apex. Further evaluation is CT of the chest is recommended. Intravenous contrast is recommended unless contraindicated. 3. These results will be called to the ordering clinician or representative by the Radiologist Assistant, and  communication documented in the PACS or zVision Dashboard. Electronically Signed: By: Nolon Nations M.D. On: 10/11/2017 11:43   Dg Lumbar Spine Complete  Result Date: 10/11/2017 CLINICAL DATA:  Fall yesterday.  Back pain EXAM: LUMBAR SPINE - COMPLETE 4+ VIEW COMPARISON:  CT abdomen pelvis 05/02/2013 FINDINGS: Normal alignment. Severe chronic compression fracture T12 unchanged from 2014. No acute fracture. Mild disc degeneration L2-3 and L3-4.  Negative for pars defect Advanced atherosclerotic calcification the thoracic and abdominal aorta with prior retroperitoneal surgery and multiple metal clips. Gallbladder clips. IMPRESSION: Severe chronic compression fracture T12.  No acute fracture. Electronically Signed   By: Franchot Gallo M.D.   On: 10/11/2017 11:43   Ct Lumbar Spine Wo Contrast  Result Date: 10/11/2017 CLINICAL DATA:  Severe low back pain after sitting down quickly in a chair. EXAM: CT LUMBAR SPINE WITHOUT CONTRAST TECHNIQUE: Multidetector CT imaging of the lumbar spine was performed without intravenous contrast administration. Multiplanar CT image reconstructions were also generated. COMPARISON:  Lumbar spine radiographs 10/11/2017. CT abdomen and pelvis 05/02/2013. FINDINGS: Segmentation: 5 lumbar type vertebrae. Alignment: Mild upper lumbar levoscoliosis. Trace retrolisthesis of L2 on L3. Vertebrae: Chronic severe T12 vertebral fracture with diffuse vertebral body sclerosis and complete  height loss centrally. T12 vertebral retropulsion measures 5 mm and results in mild spinal stenosis. There are acute mildly displaced fractures through the sacral ala bilaterally. There is also a nondisplaced right L5 transverse process fracture. No suspicious osseous lesion is identified. Paraspinal and other soft tissues: Aortoiliac atherosclerosis without aneurysm. Bilateral renal atrophy. Trace right and small left pleural effusions. Prior cholecystectomy. Disc levels: At L2-3, there is mild-to-moderate  disc space narrowing and degenerative endplate sclerosis with circumferential disc bulging resulting in mild right neural foraminal stenosis and likely right lateral recess stenosis. Milder disc bulging is present at L3-4 and L4-5 resulting in mild neural foraminal narrowing but no significant spinal stenosis. IMPRESSION: 1. Acute sacral insufficiency type fractures. 2. Nondisplaced right L5 transverse process fracture. 3. Chronic T12 fracture with vertebra plana configuration, retropulsion, and mild spinal stenosis. 4.  Aortic Atherosclerosis (ICD10-I70.0). Electronically Signed   By: Logan Bores M.D.   On: 10/11/2017 14:05   Dg Hip Unilat  With Pelvis 2-3 Views Right  Result Date: 10/08/2017 CLINICAL DATA:  Right hip pain after fall. EXAM: DG HIP (WITH OR WITHOUT PELVIS) 2-3V RIGHT COMPARISON:  CT abdomen pelvis dated May 02, 2013. FINDINGS: No acute fracture or malalignment. Mild bilateral hip joint space narrowing. The pubic symphysis and sacroiliac joints are intact. Diffuse osteopenia. Multiple surgical clips in the pelvis. Atherosclerotic vascular calcifications. IMPRESSION: No acute osseous abnormality. If occult hip fracture is suspected or if the patient is unable to bear weight, MRI is the preferred modality for further evaluation. Electronically Signed   By: Titus Dubin M.D.   On: 10/08/2017 09:24    Assessment and Plan:   1. atrial flutter with RVR vs ST Dr. Sallyanne Kuster to see,  If a flutter CHa2DS2VASc is 4  Would cover with heparin - but monitor CBC closely high risk for bleed  2.         Hx severe posterior MAC and mild regurg  3.          Elevated troponin 1.53 though no chest pain-  EKG with LBBB and no acute changes.  This could be demand ischemia from pain, check echo serial troponins.   4.          Dilated aortic root in past.  Check Echo  5.          CAD non obstructive in 2006. 60% OM only   6.          LBBB-chronic  7.          Porocarcinoma of the LEFT UE per  hematology  8.           Iron def anemia followed by hematology   9.           HTN controlled  10.         HLD continue statin   For questions or updates, please contact [place] Please consult [email or web address] for contact info under Cardiology/STEMI.   Signed, Cecilie Kicks, NP  10/11/2017 4:50 PM   I have seen and examined the patient along with Cecilie Kicks, NP.  I have reviewed the chart, notes and new data.  I agree with NP's note.  Key new complaints: She denies ever experiencing chest discomfort or dyspnea since the recent events began.  Her son tells me that they go together to the gym 3 days a week where she walks the treadmill and rides the bicycle for at least 30 minutes.  She never complains of chest discomfort with  activity. Key examination changes: Regular rhythm, 4-4/9 systolic murmur heard best over the mitral focus with an unusual squeaky component, no diastolic murmurs, no edema, normal distal pulses, no carotid bruits, no jugular venous distention, clear lungs Key new findings / data: Troponin 1.5.  I do not think her ECG shows atrial fibrillation.  I think it shows sinus rhythm/mild sinus tachycardia with superimposed motion artifact probably from essential tremor (tremor also obvious on exam).  The P waves do have an unusually spiky positive component of precordial leads and cannot entirely exclude that this is ectopic atrial tachycardia.  She is in the process of being hooked up to the monitor again right now.  Her ECG is otherwise non-diagnostic due to the presence of left bundle branch block (old).  PLAN: - I do not think that her rhythm is not atrial fibrillation or atrial flutter.  We will get another 12-lead ECG to confirm.  Most likely this is just sinus tachycardia, related to pain and discomfort. I do not recommend full anticoagulation at this time.  She should receive DVT prophylaxis. - Her murmur is peculiar.  Her last echocardiogram in 2016 showed mild  mitral regurgitation without other serious valvular abnormalities.  We will repeat an echo. - Hard to understand the significance of the abnormal troponin in the absence of any ischemic symptoms and with a nondiagnostic ECG.  She does have known CAD by remote cardiac catheterization, albeit only moderate obstruction and a secondary branch.  The echo might help via repeat evaluation of wall motion and overall LV function.  Suspicion for acute coronary syndrome is low.  Repeat cardiac enzymes should be checked.  I am not sure how to tie in the lab abnormality with the rest of her clinical presentation.  I do not think she requires urgent cardiac catheterization.  Beta-blocker should be continued without interruption to avoid rebound hypertension/tachycardia.  She is already on aspirin and an ACE inhibitor as well. -No evidence of heart failure exacerbation by physical exam.  She is lying completely flat in bed without any respiratory difficulty and has flat jugular venous pulsations.  Sanda Klein, MD, [place] [phone] 10/11/2017, 6:59 PM

## 2017-10-11 NOTE — ED Notes (Signed)
Admit dr into see

## 2017-10-11 NOTE — ED Notes (Signed)
All Heparin verified with Aaron Edelman RN

## 2017-10-11 NOTE — ED Notes (Signed)
Got patient undress on the monitor did ekg shown to Dr Oleta Mouse patient is resting with call bell in reach

## 2017-10-11 NOTE — ED Notes (Signed)
Report given to [place].  Patient stable to be transported to the floor at this time.  All belongings taken with patient to the floor.

## 2017-10-11 NOTE — ED Provider Notes (Signed)
[facility] EMERGENCY DEPARTMENT Provider Note   CSN: [id number] Arrival date & time: 10/11/17  2725     History   Chief Complaint Chief Complaint  Patient presents with  . Back Pain  . Tachycardia    HPI Judy Herrera is a 83 y.o. female.  HPI 83 year old female who presents with back pain and tachycardia.  She has a history of eccrine porocarcinoma of the skin, HTN, HLD, CAD. 3 days ago, sat down too quickly onto a chair and developed back pain and right hip pain. She presented to [facility] ED, and had x-rays that were unremarkable. She was ambulatory in the ED and discharged home.  She woke up this morning, and states that her low back pain with severe and she was unable to get out of bed.  Her son called EMS to bring her to the ED.  In route they did notice that she was very tachycardic with heart rate in the 110s-120s.  There EMS strips suggested possible atrial fibrillation.  She denies any chest pain, difficulty breathing, syncope or near syncope, with generalized weakness or fatigue.  She denies any recent illnesses including fevers, cough, domino pain, vomiting or diarrhea, edema, dysuria, urinary frequency or hematuria.  Denies any abdominal pain.  Past Medical History:  Diagnosis Date  . Allergy   . Arthritis   . Breast CA ([place])    NO BP OR STICKS IN LEFT ARM  . CAD (coronary artery disease)   . Cancer ([place])   . Cardiomyopathy ([place])    improved  . Cataract   . Diverticulosis   . Eccrine porocarcinoma of skin   . History of chemotherapy   . History of radiation therapy 1994   left chest wall  . Hyperlipidemia   . Hypertension   . Iron malabsorption 09/03/2017  . Osteoporosis   . Ovarian ca ([place])   . Wears dentures    full bottom-partial top  . Wears glasses     Patient Active Problem List   Diagnosis Date Noted  . Lung nodule 10/11/2017  . Tachycardia 10/11/2017  . Iron deficiency anemia due to chronic blood loss 09/03/2017  . Iron  malabsorption 09/03/2017  . Breast cancer screening, high risk patient 04/21/2016  . Ascending aortic aneurysm ([place]) 10/04/2015  . Lymphedema of upper extremity following lymphadenectomy 04/19/2015  . Thrombocytopenia ([place]) 04/19/2015  . Absolute anemia 04/19/2015  . Ovarian cancer ([place]) 04/19/2015  . Unilateral vocal cord paralysis 04/19/2015  . Port-A-Cath in place 04/19/2015  . 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 LBBB (left bundle branch block) 01/12/2014  . 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 TENDINITIS, RIGHT WRIST 11/12/2010  . SYNCOPE 09/23/2010  . BENIGN NEOPLASM SKIN OTHER&UNSPEC PARTS FACE 08/06/2010  . BACK PAIN 07/03/2010  . Hyperlipidemia 01/15/2010  . Congestive dilated cardiomyopathy ([place]) 01/15/2010  . Allergic rhinitis 08/22/2009  . Osteoporosis 02/16/2008  . COPD (chronic obstructive pulmonary disease) ([place]) 02/04/2008  . Essential hypertension 07/13/2007  . Coronary atherosclerosis 07/13/2007  . BREAST CANCER, HX OF 07/13/2007  . COLONIC POLYPS, HX OF 07/13/2007    Past Surgical History:  Procedure Laterality Date  . ABDOMINAL HYSTERECTOMY  2004  . BREAST SURGERY    . CHOLECYSTECTOMY  1993  . EYE SURGERY     CATARACTS  . left breast mastectomy  1994   T3 N1 Mx  . MASS EXCISION Left 05/09/2014   Procedure: EXCISION OF NODULE LEFT CHEST ;  Surgeon: Pedro Earls, MD;  Location: [facility];  Service: General;  Laterality: Left;  Marland Kitchen MODIFIED RADICAL MASTECTOMY W/ AXILLARY LYMPH NODE DISSECTION W/ PECTORALIS MINOR EXCISION  1994   Left  . right leg  1990   fx  . SHOULDER ARTHROSCOPY    . [place]   replacement-lt  . SKIN BIOPSY  04/27/2011   left hand, forearm and posterior arm    OB History    No data available       Home Medications    Prior to Admission medications   Medication Sig Start Date End Date Taking? Authorizing Provider  amoxicillin (AMOXIL) 500 MG capsule TAKE 4 CAPSULES 1 HOUR PRIOR  TO DENTAL APPOINTMENT 09/16/17   [provider]  aspirin EC 81 MG tablet Take 81 mg by mouth at bedtime.     [provider]  calcium carbonate (TUMS - DOSED IN MG ELEMENTAL CALCIUM) 500 MG chewable tablet Chew 2 tablets by mouth daily with supper.    [provider]  carvedilol (COREG) 25 MG tablet TAKE 1 TABLET TWICE A DAY WITH FOOD 05/10/17   Marletta Lor, MD  Cholecalciferol (VITAMIN D) 2000 UNITS tablet Take 2,000 Units by mouth every morning.     [provider]  Coenzyme Q10 (CO Q 10) 100 MG CAPS Take 1 capsule by mouth every morning.     [provider]  denosumab (PROLIA) 60 MG/ML SOLN injection Inject 60 mg into the skin every 6 (six) months. Administer in upper arm, thigh, or abdomen    [provider]  fluticasone (FLONASE) 50 MCG/ACT nasal spray SHAKE LIQUID AND USE 2 SPRAYS IN EACH NOSTRIL DAILY Patient not taking: Reported on 08/20/2017 09/15/16   Marletta Lor, MD  lisinopril-hydrochlorothiazide (PRINZIDE,ZESTORETIC) 20-12.5 MG tablet TAKE 1 TABLET EVERY DAY 10/04/17   Marletta Lor, MD  loperamide (IMODIUM) 2 MG capsule Take 1 capsule (2 mg total) by mouth as needed for diarrhea or loose stools. 01/24/13   Gordy Levan, MD  megestrol (MEGACE ES) 625 MG/5ML suspension Take 5 mLs (625 mg total) by mouth daily. 09/02/17   Volanda Napoleon, MD  ondansetron (ZOFRAN) 4 MG tablet Take 1 tablet (4 mg total) by mouth every 6 (six) hours. Patient not taking: Reported on 08/20/2017 11/28/16   Noe Gens, PA-C  pantoprazole (PROTONIX) 40 MG tablet TAKE 1 TABLET BY MOUTH EVERY DAY Patient not taking: Reported on 08/20/2017 09/16/16   Marletta Lor, MD  simvastatin (ZOCOR) 40 MG tablet TAKE 1 TABLET AT BEDTIME 09/28/17   Marletta Lor, MD    Family History Family History  Problem Relation Age of Onset  . Breast cancer Sister        dx in her 33s  . Heart disease Sister   . Heart disease Father   .  Heart disease Mother   . Stroke Mother   . Prostate cancer Maternal Uncle   . Stroke Maternal Grandmother   . Testicular cancer Maternal Grandfather   . Stroke Maternal Grandfather   . Stroke Paternal Grandmother   . Heart disease Paternal Grandfather   . Breast cancer Sister 83       BRCA2+  . Breast cancer Sister 35  . Prostate cancer Maternal Uncle   . Breast cancer Other        maternal great aunt dx later in life    Social History Social History   Tobacco Use  . Smoking status: Never Smoker  .  Smokeless tobacco: Never Used  Substance Use Topics  . Alcohol use: No  . Drug use: No     Allergies   Ibuprofen and Morphine   Review of Systems Review of Systems  Constitutional: Negative for fever.  Respiratory: Negative for shortness of breath.   Cardiovascular: Negative for chest pain.  Gastrointestinal: Negative for abdominal pain, diarrhea and vomiting.  Genitourinary: Negative for dysuria.  Musculoskeletal: Positive for back pain.  All other systems reviewed and are negative.    Physical Exam Updated Vital Signs BP 133/73   Pulse (!) 107   Temp 97.7 F (36.5 C) (Oral)   Resp (!) 30   Ht 5' 3"  (1.6 m)   Wt 44.5 kg (98 lb)   SpO2 94%   BMI 17.36 kg/m   Physical Exam Physical Exam  Nursing note and vitals reviewed. Constitutional: Well developed, well nourished, non-toxic, and in no acute distress Head: Normocephalic and atraumatic.  Mouth/Throat: Oropharynx is clear and moist.  Neck: Normal range of motion. Neck supple.  Cardiovascular: Tachycardic rate and reregular rhythm.   Pulmonary/Chest: Effort normal and breath sounds normal.  Abdominal: Soft. There is no tenderness. There is no rebound and no guarding.  Musculoskeletal: Tenderness to palpation of the low lumbar spine.  Neurological: Alert, no facial droop, fluent speech, full strength bilateral lower extremities, full sensation to light touch in tact in bilateral lowe extremities Skin: Skin  is warm and dry.  Psychiatric: Cooperative   ED Treatments / Results  Labs (all labs ordered are listed, but only abnormal results are displayed) Labs Reviewed  BASIC METABOLIC PANEL - Abnormal; Notable for the following components:      Result Value   Sodium 134 (*)    CO2 21 (*)    Glucose, Bld 142 (*)    BUN 26 (*)    Creatinine, Ser 1.09 (*)    Calcium 8.1 (*)    GFR calc non Af Amer 45 (*)    GFR calc Af Amer 52 (*)    All other components within normal limits  CBC - Abnormal; Notable for the following components:   RBC 3.38 (*)    Hemoglobin 9.5 (*)    HCT 29.4 (*)    RDW 18.3 (*)    All other components within normal limits  TSH  MAGNESIUM  URINALYSIS, ROUTINE W REFLEX MICROSCOPIC    EKG  EKG Interpretation  Date/Time:  Monday October 11 2017 13:27:14 EST Ventricular Rate:  110 PR Interval:    QRS Duration: 141 QT Interval:  387 QTC Calculation: 524 R Axis:   73 Text Interpretation:  Atrial fibrillation IVCD, consider atypical LBBB Confirmed by Brantley Stage [phone]) on 10/11/2017 1:32:08 PM       Radiology Dg Chest 1 View  Addendum Date: 10/11/2017   ADDENDUM REPORT: 10/11/2017 11:56 ADDENDUM: These results were called by telephone at the time of interpretation on 10/11/2017 at 11:56 am to Dr. Brantley Stage , who verbally acknowledged these results. Electronically Signed   By: Nolon Nations M.D.   On: 10/11/2017 11:56   Result Date: 10/11/2017 CLINICAL DATA:  New onset AFib. Fell down yesterday. Lower back pain. EXAM: CHEST 1 VIEW COMPARISON:  11/28/2016 FINDINGS: Lungs are hyperinflated. Heart size is enlarged. There is increased left apical opacity, raising the question of pleural thickening or mass. Further evaluation with CT is recommended. Prominence of the right hilar region appears stable over time. Surgical clips overlie the left axilla. IMPRESSION: 1. Hyperinflation and stable cardiomegaly. 2.  Question of mass the left lung apex. Further evaluation is CT  of the chest is recommended. Intravenous contrast is recommended unless contraindicated. 3. These results will be called to the ordering clinician or representative by the Radiologist Assistant, and communication documented in the PACS or zVision Dashboard. Electronically Signed: By: Nolon Nations M.D. On: 10/11/2017 11:43   Dg Lumbar Spine Complete  Result Date: 10/11/2017 CLINICAL DATA:  Fall yesterday.  Back pain EXAM: LUMBAR SPINE - COMPLETE 4+ VIEW COMPARISON:  CT abdomen pelvis 05/02/2013 FINDINGS: Normal alignment. Severe chronic compression fracture T12 unchanged from 2014. No acute fracture. Mild disc degeneration L2-3 and L3-4.  Negative for pars defect Advanced atherosclerotic calcification the thoracic and abdominal aorta with prior retroperitoneal surgery and multiple metal clips. Gallbladder clips. IMPRESSION: Severe chronic compression fracture T12.  No acute fracture. Electronically Signed   By: Franchot Gallo M.D.   On: 10/11/2017 11:43   Ct Lumbar Spine Wo Contrast  Result Date: 10/11/2017 CLINICAL DATA:  Severe low back pain after sitting down quickly in a chair. EXAM: CT LUMBAR SPINE WITHOUT CONTRAST TECHNIQUE: Multidetector CT imaging of the lumbar spine was performed without intravenous contrast administration. Multiplanar CT image reconstructions were also generated. COMPARISON:  Lumbar spine radiographs 10/11/2017. CT abdomen and pelvis 05/02/2013. FINDINGS: Segmentation: 5 lumbar type vertebrae. Alignment: Mild upper lumbar levoscoliosis. Trace retrolisthesis of L2 on L3. Vertebrae: Chronic severe T12 vertebral fracture with diffuse vertebral body sclerosis and complete height loss centrally. T12 vertebral retropulsion measures 5 mm and results in mild spinal stenosis. There are acute mildly displaced fractures through the sacral ala bilaterally. There is also a nondisplaced right L5 transverse process fracture. No suspicious osseous lesion is identified. Paraspinal and other  soft tissues: Aortoiliac atherosclerosis without aneurysm. Bilateral renal atrophy. Trace right and small left pleural effusions. Prior cholecystectomy. Disc levels: At L2-3, there is mild-to-moderate disc space narrowing and degenerative endplate sclerosis with circumferential disc bulging resulting in mild right neural foraminal stenosis and likely right lateral recess stenosis. Milder disc bulging is present at L3-4 and L4-5 resulting in mild neural foraminal narrowing but no significant spinal stenosis. IMPRESSION: 1. Acute sacral insufficiency type fractures. 2. Nondisplaced right L5 transverse process fracture. 3. Chronic T12 fracture with vertebra plana configuration, retropulsion, and mild spinal stenosis. 4.  Aortic Atherosclerosis (ICD10-I70.0). Electronically Signed   By: Logan Bores M.D.   On: 10/11/2017 14:05    Procedures Procedures (including critical care time)  Medications Ordered in ED Medications  sodium chloride 0.9 % bolus 500 mL (0 mLs Intravenous Stopped 10/11/17 1453)  fentaNYL (SUBLIMAZE) injection 25 mcg (25 mcg Intravenous Given 10/11/17 1043)  HYDROcodone-acetaminophen (NORCO/VICODIN) 5-325 MG per tablet 1 tablet (1 tablet Oral Given 10/11/17 1338)  sodium chloride 0.9 % bolus 500 mL (0 mLs Intravenous Stopped 10/11/17 1453)     Initial Impression / Assessment and Plan / ED Course  I have reviewed the triage vital signs and the nursing notes.  Pertinent labs & imaging results that were available during my care of the patient were reviewed by me and considered in my medical decision making (see chart for details).     Presents with low back pain after recent fall 2 days ago.  Is neurologically intact.  Exquisite pain in the low call spine and lower lumbar spine.  Initial x-rays only showed T12 compression fracture which is old.  Subsequent CT scan performed and shows L5 transverse fracture and sacral insufficiency fracture, which is the etiology of her symptoms.  Pain not controlled with fentanyl and subsequent Vicodin.  She is unable to sit up or ambulate.  Suspect that she will require admission for pain control and PT/OT.  Patient also noted to be tachycardic with heart rate initially in the 120s on arrival.  EKGs by EMS and here in the ED suggest atrial fibrillation.  She is asymptomatic.  With pain control and IV fluids her heart rate is in the low 100s, and I do not think she requires any further rate control.  No electrolyte derangements and normal thyroid function.  Spoke with hospitalist service will admit for observation primarily for pain control, PT/OT related to the fractures of her spine.  Final Clinical Impressions(s) / ED Diagnoses   Final diagnoses:  Closed fracture of transverse process of lumbar vertebra, initial encounter [facility])  Closed fracture of sacrum, unspecified portion of sacrum, initial encounter [facility])    ED Discharge Orders        Ordered    Amb referral to AFIB Clinic     10/11/17 1025       Forde Dandy, MD 10/11/17 1458

## 2017-10-11 NOTE — Progress Notes (Signed)
ANTICOAGULATION CONSULT NOTE - Initial Consult  Pharmacy Consult for Heparin Indication: atrial fibrillation  Allergies  Allergen Reactions  . Ibuprofen Nausea Only  . Morphine Nausea And Vomiting    Patient Measurements: Height: 5\' 3"  (160 cm) Weight: 98 lb (44.5 kg) IBW/kg (Calculated) : 52.4 Heparin Dosing Weight: 44.5 kg  Vital Signs: Temp: 97.7 F (36.5 C) (11/26 0938) Temp Source: Oral (11/26 0938) BP: 125/59 (11/26 1600) Pulse Rate: 102 (11/26 1600)  Labs: Recent Labs    10/11/17 0956 10/11/17 1504  HGB 9.5*  --   HCT 29.4*  --   PLT 182  --   CREATININE 1.09*  --   TROPONINI  --  1.53*    Estimated Creatinine Clearance: 27 mL/min (A) (by C-G formula based on SCr of 1.09 mg/dL (H)).   Medical History: Past Medical History:  Diagnosis Date  . Allergy   . Arthritis   . Breast CA ([place])    NO BP OR STICKS IN LEFT ARM  . CAD (coronary artery disease)   . Cancer ([place])   . Cardiomyopathy ([place])    improved  . Cataract   . Diverticulosis   . Eccrine porocarcinoma of skin   . History of chemotherapy   . History of radiation therapy 1994   left chest wall  . Hyperlipidemia   . Hypertension   . Iron malabsorption 09/03/2017  . Osteoporosis   . Ovarian ca ([place])   . Wears dentures    full bottom-partial top  . Wears glasses     Medications:  No anticoagulants prior to admission  Assessment: 83 year old female admitted 10/11/17 with severe low back pain and tachycardia and EMS strip suggest new atrial fibrillation. Pharmacy was consulted to start IV heparin therapy. Patient also has elevated troponin at 1.53. CHADsVASC = 4.   Hgb is 9.5 (stable from previous labs) and platelets are 182 (down some from previous labs of 212-259).    Goal of Therapy:  Heparin level 0.3-0.7 units/ml Monitor platelets by anticoagulation protocol: Yes   Plan:  Discontinue sq heparin.  Start IV heparin - bolus 2500 units x1, then infusion at 650 units/hr.  Heparin  level in 8 hours.  Daily heparin level and CBC while on therapy.   Sloan Leiter, PharmD, BCPS, BCCCP Clinical Pharmacist Clinical phone 10/11/2017 until 11PM [phone] After hours, please call #[phone] 10/11/2017,4:54 PM

## 2017-10-11 NOTE — ED Notes (Signed)
attemptin to get ua

## 2017-10-11 NOTE — H&P (Signed)
History and Physical    Judy Herrera ZOX:[id number] DOB: Dec 23, 1930 DOA: 10/11/2017   PCP: Marletta Lor, MD   Patient coming from:  Home    Chief Complaint: Severe back pain   HPI: Judy Herrera is a 83 y.o. 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  X-rays and overall exam was unremarkable, she was ambulatory and discharged home.  However, this morning she woke up unable to get out of bed without assistance.  She had, much more severe lower back pain.  She denies any numbness or tingling, she denies any constipation or difficulty with urination or incontinence.  The patient denies any saddle anesthesia.  She denies any rest pain or palpitations. NO changes in meds or herbal products  She denies any shortness of breath cough.  Of note, in route, EMS noted that the patient was very tachycardic, with heart rate between 110-120s.  The EMS strips suggested possible atrial fibrillation, but she was essentially asymptomatic.  She denies any syncope or presyncope, chest pain or palpitations. Denies shortness of breath  This is new to her, denies any history of atrial fibrillation.   ED Course:  BP (!) 125/59   Pulse (!) 102   Temp 97.7 F (36.5 C) (Oral)   Resp (!) 24   Ht 5' 3"  (1.6 m)   Wt 44.5 kg (98 lb)   SpO2 92%   BMI 17.36 kg/m   She received fentanyl, hydrocodone, and small amount  of IV fluids, without significant relief. Although neurologically intact, it was felt necessary to  proceed with further imaging. Initially x-rays showed old T12 compression fracture  CT of the L-spine showed L5 transverse fracture and sacral insufficiency fracture as the etiology of her symptoms. EKG at the ED shows atrial fibrillation, possible atypical LBBB.  CHADSVASC is 2  Hb 9.5, rest of the CBC is unremarkable Tn 1.53  1500 hrs. Creatinine 1.09, stable Last anemia panel in September 30, 2017 shows iron of 54, saturation 22, ferritin elevated which indicates chronic disease at 753 Glucose is 142 Cardiology consultation is pending.  Review of Systems:  As per HPI otherwise all other systems reviewed and are negative  Past Medical History:  Diagnosis Date  . Allergy   . Arthritis   . Breast CA ([place])    NO BP OR STICKS IN LEFT ARM  . CAD (coronary artery disease)   . Cancer ([place])   . Cardiomyopathy ([place])    improved  . Cataract   . Diverticulosis   . Eccrine porocarcinoma of skin   . History of chemotherapy   . History of radiation therapy 1994   left chest wall  . Hyperlipidemia   . Hypertension   . Iron malabsorption 09/03/2017  . Osteoporosis   . Ovarian ca ([place])   . Wears dentures    full bottom-partial top  . Wears glasses     Past Surgical History:  Procedure Laterality Date  . ABDOMINAL HYSTERECTOMY  2004  . BREAST SURGERY    .  CHOLECYSTECTOMY  1993  . EYE SURGERY     CATARACTS  . left breast mastectomy  1994   T3 N1 Mx  . MASS EXCISION Left 05/09/2014   Procedure: EXCISION OF NODULE LEFT CHEST ;  Surgeon: Pedro Earls, MD;  Location: [place];  Service: General;  Laterality: Left;  Marland Kitchen MODIFIED RADICAL MASTECTOMY W/ AXILLARY LYMPH NODE DISSECTION W/ PECTORALIS MINOR EXCISION  1994   Left  . right leg  1990   fx  . SHOULDER ARTHROSCOPY    . [place]   replacement-lt  . SKIN BIOPSY  04/27/2011   left hand, forearm and posterior arm    Social History Social History   Socioeconomic History  . Marital status:  Widowed    Spouse name: Not on file  . Number of children: 1  . Years of education: Not on file  . Highest education level: Not on file  Social Needs  . Financial resource strain: Not on file  . Food insecurity - worry: Not on file  . Food insecurity - inability: Not on file  . Transportation needs - medical: Not on file  . Transportation needs - non-medical: Not on file  Occupational History  . Not on file  Tobacco Use  . Smoking status: Never Smoker  . Smokeless tobacco: Never Used  Substance and Sexual Activity  . Alcohol use: No  . Drug use: No  . Sexual activity: Not on file    Comment: didn't ask  Other Topics Concern  . Not on file  Social History Narrative  . Not on file     Allergies  Allergen Reactions  . Ibuprofen Nausea Only  . Morphine Nausea And Vomiting    Family History  Problem Relation Age of Onset  . Breast cancer Sister        dx in her 58s  . Heart disease Sister   . Heart disease Father   . Heart disease Mother   . Stroke Mother   . Prostate cancer Maternal Uncle   . Stroke Maternal Grandmother   . Testicular cancer Maternal Grandfather   . Stroke Maternal Grandfather   . Stroke Paternal Grandmother   . Heart disease Paternal Grandfather   . Breast cancer Sister 67       BRCA2+  . Breast cancer Sister 15  . Prostate cancer Maternal Uncle   . Breast cancer Other        maternal great aunt dx later in life      Prior to Admission medications   Medication Sig Start Date End Date Taking? Authorizing Provider  amoxicillin (AMOXIL) 500 MG capsule TAKE 4 CAPSULES 1 HOUR PRIOR TO DENTAL APPOINTMENT 09/16/17   [provider]  aspirin EC 81 MG tablet Take 81 mg by mouth at bedtime.     [provider]  calcium carbonate (TUMS - DOSED IN MG ELEMENTAL CALCIUM) 500 MG chewable tablet Chew 2 tablets by mouth daily with supper.    [provider]  carvedilol (COREG) 25 MG tablet TAKE 1 TABLET TWICE A DAY WITH FOOD 05/10/17    Marletta Lor, MD  Cholecalciferol (VITAMIN D) 2000 UNITS tablet Take 2,000 Units by mouth every morning.     [provider]  Coenzyme Q10 (CO Q 10) 100 MG CAPS Take 1 capsule by mouth every morning.     [provider]  denosumab (PROLIA) 60 MG/ML SOLN injection Inject 60 mg into the skin every 6 (  six) months. Administer in upper arm, thigh, or abdomen    [provider]  fluticasone (FLONASE) 50 MCG/ACT nasal spray SHAKE LIQUID AND USE 2 SPRAYS IN EACH NOSTRIL DAILY Patient not taking: Reported on 08/20/2017 09/15/16   Marletta Lor, MD  lisinopril-hydrochlorothiazide (PRINZIDE,ZESTORETIC) 20-12.5 MG tablet TAKE 1 TABLET EVERY DAY 10/04/17   Marletta Lor, MD  loperamide (IMODIUM) 2 MG capsule Take 1 capsule (2 mg total) by mouth as needed for diarrhea or loose stools. 01/24/13   Gordy Levan, MD  megestrol (MEGACE ES) 625 MG/5ML suspension Take 5 mLs (625 mg total) by mouth daily. 09/02/17   Volanda Napoleon, MD  ondansetron (ZOFRAN) 4 MG tablet Take 1 tablet (4 mg total) by mouth every 6 (six) hours. Patient not taking: Reported on 08/20/2017 11/28/16   Noe Gens, PA-C  pantoprazole (PROTONIX) 40 MG tablet TAKE 1 TABLET BY MOUTH EVERY DAY Patient not taking: Reported on 08/20/2017 09/16/16   Marletta Lor, MD  simvastatin (ZOCOR) 40 MG tablet TAKE 1 TABLET AT BEDTIME 09/28/17   Marletta Lor, MD    Physical Exam:  Vitals:   10/11/17 1517 10/11/17 1530 10/11/17 1545 10/11/17 1600  BP: 125/75 128/70 126/68 (!) 125/59  Pulse:  (!) 106 (!) 103 (!) 102  Resp:  (!) 28 (!) 21 (!) 24  Temp:      TempSrc:      SpO2:  92% 93% 92%  Weight:      Height:       Constitutional: NAD, calm, comfortable after pain meds given  Eyes: PERRL, lids and conjunctivae normal ENMT: Mucous membranes are moist, without exudate or lesions  Neck: normal, supple, no masses, no thyromegaly Respiratory: clear to auscultation bilaterally, no  wheezing, no crackles. Normal respiratory effort  Cardiovascular: Regular rate and rhythm,2-3/6 high pitch murmur , no  rubs or gallops. No extremity edema. LUE lymphedema 2+ pedal pulses. No carotid bruits.  Abdomen: Soft, non tender, No hepatosplenomegaly. Bowel sounds positive.  Musculoskeletal: no clubbing / cyanosis. Moves all extremities. Tender to palpation at the sacral area and with movement. No deformity in legs.  Skin: no jaundice,  porocarcinoma of the skin of the left upper extremity with radiation changes  Neurologic: Sensation intact  Strength equal in all extremities Psychiatric:   Alert and oriented x 3. Flat affect     Labs on Admission: I have personally reviewed following labs and imaging studies  CBC: Recent Labs  Lab 10/11/17 0956  WBC 7.4  HGB 9.5*  HCT 29.4*  MCV 87.0  PLT 678    Basic Metabolic Panel: Recent Labs  Lab 10/11/17 0956  NA 134*  K 3.9  CL 103  CO2 21*  GLUCOSE 142*  BUN 26*  CREATININE 1.09*  CALCIUM 8.1*  MG 2.0    GFR: Estimated Creatinine Clearance: 27 mL/min (A) (by C-G formula based on SCr of 1.09 mg/dL (H)).  Liver Function Tests: No results for input(s): AST, ALT, ALKPHOS, BILITOT, PROT, ALBUMIN in the last 168 hours. No results for input(s): LIPASE, AMYLASE in the last 168 hours. No results for input(s): AMMONIA in the last 168 hours.  Coagulation Profile: No results for input(s): INR, PROTIME in the last 168 hours.  Cardiac Enzymes: Recent Labs  Lab 10/11/17 1504  TROPONINI 1.53*    BNP (last 3 results) No results for input(s): PROBNP in the last 8760 hours.  HbA1C: No results for input(s): HGBA1C in the last 72 hours.  CBG: No  results for input(s): GLUCAP in the last 168 hours.  Lipid Profile: No results for input(s): CHOL, HDL, LDLCALC, TRIG, CHOLHDL, LDLDIRECT in the last 72 hours.  Thyroid Function Tests: Recent Labs    10/11/17 1032  TSH 1.661    Anemia Panel: No results for input(s):  VITAMINB12, FOLATE, FERRITIN, TIBC, IRON, RETICCTPCT in the last 72 hours.  Urine analysis:    Component Value Date/Time   COLORURINE YELLOW 10/01/2015 [place] 10/01/2015 1155   LABSPEC 1.008 10/01/2015 1155   LABSPEC 1.010 05/07/2014 0835   PHURINE 7.5 10/01/2015 1155   GLUCOSEU NEGATIVE 10/01/2015 1155   GLUCOSEU Negative 05/07/2014 0835   HGBUR NEGATIVE 10/01/2015 1155   HGBUR 3+ 07/22/2011 1338   BILIRUBINUR moderate (A) 05/01/2017 1256   BILIRUBINUR negative 01/25/2017 1039   BILIRUBINUR Negative 05/07/2014 0835   KETONESUR trace (5) (A) 05/01/2017 1256   KETONESUR NEGATIVE 10/01/2015 1155   PROTEINUR trace (A) 05/01/2017 1256   PROTEINUR negative 01/25/2017 1039   PROTEINUR NEGATIVE 10/01/2015 1155   UROBILINOGEN negative (A) 05/01/2017 1256   UROBILINOGEN 0.2 05/07/2014 0835   NITRITE Positive (A) 05/01/2017 1256   NITRITE negative 01/25/2017 1039   NITRITE NEGATIVE 10/01/2015 1155   LEUKOCYTESUR Large (3+) (A) 05/01/2017 1256   LEUKOCYTESUR Negative 05/07/2014 0835    Sepsis Labs: @LABRCNTIP (procalcitonin:4,lacticidven:4) )No results found for this or any previous visit (from the past 240 hour(s)).   Radiological Exams on Admission: Dg Chest 1 View  Addendum Date: 10/11/2017   ADDENDUM REPORT: 10/11/2017 11:56 ADDENDUM: These results were called by telephone at the time of interpretation on 10/11/2017 at 11:56 am to Dr. Brantley Stage , who verbally acknowledged these results. Electronically Signed   By: Nolon Nations M.D.   On: 10/11/2017 11:56   Result Date: 10/11/2017 CLINICAL DATA:  New onset AFib. Fell down yesterday. Lower back pain. EXAM: CHEST 1 VIEW COMPARISON:  11/28/2016 FINDINGS: Lungs are hyperinflated. Heart size is enlarged. There is increased left apical opacity, raising the question of pleural thickening or mass. Further evaluation with CT is recommended. Prominence of the right hilar region appears stable over time. Surgical clips  overlie the left axilla. IMPRESSION: 1. Hyperinflation and stable cardiomegaly. 2. Question of mass the left lung apex. Further evaluation is CT of the chest is recommended. Intravenous contrast is recommended unless contraindicated. 3. These results will be called to the ordering clinician or representative by the Radiologist Assistant, and communication documented in the PACS or zVision Dashboard. Electronically Signed: By: Nolon Nations M.D. On: 10/11/2017 11:43   Dg Lumbar Spine Complete  Result Date: 10/11/2017 CLINICAL DATA:  Fall yesterday.  Back pain EXAM: LUMBAR SPINE - COMPLETE 4+ VIEW COMPARISON:  CT abdomen pelvis 05/02/2013 FINDINGS: Normal alignment. Severe chronic compression fracture T12 unchanged from 2014. No acute fracture. Mild disc degeneration L2-3 and L3-4.  Negative for pars defect Advanced atherosclerotic calcification the thoracic and abdominal aorta with prior retroperitoneal surgery and multiple metal clips. Gallbladder clips. IMPRESSION: Severe chronic compression fracture T12.  No acute fracture. Electronically Signed   By: Franchot Gallo M.D.   On: 10/11/2017 11:43   Ct Lumbar Spine Wo Contrast  Result Date: 10/11/2017 CLINICAL DATA:  Severe low back pain after sitting down quickly in a chair. EXAM: CT LUMBAR SPINE WITHOUT CONTRAST TECHNIQUE: Multidetector CT imaging of the lumbar spine was performed without intravenous contrast administration. Multiplanar CT image reconstructions were also generated. COMPARISON:  Lumbar spine radiographs 10/11/2017. CT abdomen and pelvis 05/02/2013. FINDINGS: Segmentation:  5 lumbar type vertebrae. Alignment: Mild upper lumbar levoscoliosis. Trace retrolisthesis of L2 on L3. Vertebrae: Chronic severe T12 vertebral fracture with diffuse vertebral body sclerosis and complete height loss centrally. T12 vertebral retropulsion measures 5 mm and results in mild spinal stenosis. There are acute mildly displaced fractures through the sacral ala  bilaterally. There is also a nondisplaced right L5 transverse process fracture. No suspicious osseous lesion is identified. Paraspinal and other soft tissues: Aortoiliac atherosclerosis without aneurysm. Bilateral renal atrophy. Trace right and small left pleural effusions. Prior cholecystectomy. Disc levels: At L2-3, there is mild-to-moderate disc space narrowing and degenerative endplate sclerosis with circumferential disc bulging resulting in mild right neural foraminal stenosis and likely right lateral recess stenosis. Milder disc bulging is present at L3-4 and L4-5 resulting in mild neural foraminal narrowing but no significant spinal stenosis. IMPRESSION: 1. Acute sacral insufficiency type fractures. 2. Nondisplaced right L5 transverse process fracture. 3. Chronic T12 fracture with vertebra plana configuration, retropulsion, and mild spinal stenosis. 4.  Aortic Atherosclerosis (ICD10-I70.0). Electronically Signed   By: Logan Bores M.D.   On: 10/11/2017 14:05    EKG: Independently reviewed.  Assessment/Plan Active Problems:   Sacral fracture (HCC)   Atrial fibrillation (HCC)   Hyperlipidemia   Essential hypertension   Osteoporosis   BREAST CANCER, HX OF   Eccrine porocarcinoma of skin   LBBB (left bundle branch block)   Unilateral vocal cord paralysis   Ascending aortic aneurysm (HCC)   Iron deficiency anemia due to chronic blood loss   Iron malabsorption   Lung nodule   Tachycardia     Acute severe lower back pain in a patient with history of Osteoporosis CT of the L-spine showed L5 transverse fracture and sacral insufficiency fracture as the etiology of her symptoms. She received fentanyl, hydrocodone, and small amount  of IV fluids, without significant relief. Admit to IP tele Continue pain control with bowel support including Vicodin, Dilaudid, as well as Toradol as needed IV PT and OT consult COntinue Ca and VIt D   Atrial Fibrillation,new diagnosis  CHADSVASC 2-3  , EKG EKG  at the ED shows atrial fibrillation, possible atypical LBBB.  , Tn very elevated at 1.53  TSH pending . Last Cath in 2006 shows 60% OM otherwise nonobstructive artery disease.  Her last CTA in 11 of 2016 showed no PE, but a thoracic aortic aneurysm of 4 cm. Telemetry  Atrial Fibrillation focused order set 2 D echo Cardiology to consult today, appreciate involvement  Anticoagulation with Heparin per Pharmacy  Serial TN  And EKG   Anemia of chronic disease Hemoglobin on admission 9.5 at baseline. Roxan Hockey at the [facility], Dr. 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

## 2017-10-11 NOTE — ED Triage Notes (Signed)
Arrived via EMS from home patient sat in chair hard 4 days ago seen in ED for lower back pain and right hip pain.  Pain continued today and EMS reported HR 120-130 LBB Doctor notified.  Patient alert answering and following commands appropriate.

## 2017-10-11 NOTE — ED Notes (Signed)
Attempted to call report

## 2017-10-12 ENCOUNTER — Encounter (HOSPITAL_COMMUNITY): Payer: Self-pay | Admitting: General Practice

## 2017-10-12 ENCOUNTER — Other Ambulatory Visit: Payer: Self-pay

## 2017-10-12 ENCOUNTER — Inpatient Hospital Stay (HOSPITAL_COMMUNITY): Payer: Medicare HMO

## 2017-10-12 DIAGNOSIS — I712 Thoracic aortic aneurysm, without rupture: Secondary | ICD-10-CM

## 2017-10-12 DIAGNOSIS — R7989 Other specified abnormal findings of blood chemistry: Secondary | ICD-10-CM

## 2017-10-12 DIAGNOSIS — I471 Supraventricular tachycardia: Secondary | ICD-10-CM

## 2017-10-12 DIAGNOSIS — R778 Other specified abnormalities of plasma proteins: Secondary | ICD-10-CM

## 2017-10-12 DIAGNOSIS — R748 Abnormal levels of other serum enzymes: Secondary | ICD-10-CM

## 2017-10-12 DIAGNOSIS — I361 Nonrheumatic tricuspid (valve) insufficiency: Secondary | ICD-10-CM

## 2017-10-12 DIAGNOSIS — S32009A Unspecified fracture of unspecified lumbar vertebra, initial encounter for closed fracture: Secondary | ICD-10-CM

## 2017-10-12 LAB — COMPREHENSIVE METABOLIC PANEL
ALBUMIN: 2.7 g/dL — AB (ref 3.5–5.0)
ALT: 24 U/L (ref 14–54)
AST: 28 U/L (ref 15–41)
Alkaline Phosphatase: 56 U/L (ref 38–126)
Anion gap: 11 (ref 5–15)
BUN: 39 mg/dL — AB (ref 6–20)
CHLORIDE: 104 mmol/L (ref 101–111)
CO2: 17 mmol/L — AB (ref 22–32)
Calcium: 7.3 mg/dL — ABNORMAL LOW (ref 8.9–10.3)
Creatinine, Ser: 1.5 mg/dL — ABNORMAL HIGH (ref 0.44–1.00)
GFR calc Af Amer: 36 mL/min — ABNORMAL LOW (ref >60)
GFR calc non Af Amer: 31 mL/min — ABNORMAL LOW (ref >60)
GLUCOSE: 121 mg/dL — AB (ref 65–99)
POTASSIUM: 4.1 mmol/L (ref 3.5–5.1)
SODIUM: 132 mmol/L — AB (ref 135–145)
Total Bilirubin: 2.1 mg/dL — ABNORMAL HIGH (ref 0.3–1.2)
Total Protein: 5.5 g/dL — ABNORMAL LOW (ref 6.5–8.1)

## 2017-10-12 LAB — HEPARIN LEVEL (UNFRACTIONATED)
Heparin Unfractionated: 0.15 IU/mL — ABNORMAL LOW (ref 0.30–0.70)
Heparin Unfractionated: <0.1 IU/mL — ABNORMAL LOW (ref 0.30–0.70)

## 2017-10-12 LAB — PROTIME-INR
INR: 1.39
Prothrombin Time: 17 seconds — ABNORMAL HIGH (ref 11.4–15.2)

## 2017-10-12 LAB — ECHOCARDIOGRAM COMPLETE
Height: 63 in
WEIGHTICAEL: 1568 [oz_av]

## 2017-10-12 LAB — TROPONIN I: Troponin I: 1.45 ng/mL (ref <0.03)

## 2017-10-12 MED ORDER — HEPARIN BOLUS VIA INFUSION
1500.0000 [IU] | Freq: Once | INTRAVENOUS | Status: AC
  Administered 2017-10-12: 1500 [IU] via INTRAVENOUS
  Filled 2017-10-12: qty 1500

## 2017-10-12 MED ORDER — HEPARIN SODIUM (PORCINE) 5000 UNIT/ML IJ SOLN: 5000.0000 [IU] | Freq: Three times a day (TID) | INTRAMUSCULAR | Status: DC

## 2017-10-12 MED ORDER — SENNOSIDES-DOCUSATE SODIUM 8.6-50 MG PO TABS
1.0000 | ORAL_TABLET | Freq: Every day | ORAL | Status: DC
  Administered 2017-10-12 – 2017-10-20 (×7): 1 via ORAL
  Filled 2017-10-12 (×7): qty 1

## 2017-10-12 MED ORDER — SODIUM CHLORIDE 0.9 % IV BOLUS (SEPSIS)
500.0000 mL | Freq: Once | INTRAVENOUS | Status: AC
  Administered 2017-10-12: 500 mL via INTRAVENOUS

## 2017-10-12 MED ORDER — SODIUM CHLORIDE 0.9 % IV BOLUS (SEPSIS)
1000.0000 mL | Freq: Once | INTRAVENOUS | Status: AC
  Administered 2017-10-12: 1000 mL via INTRAVENOUS

## 2017-10-12 MED ORDER — HEPARIN (PORCINE) IN NACL 100-0.45 UNIT/ML-% IJ SOLN
950.0000 [IU]/h | INTRAMUSCULAR | Status: DC
  Administered 2017-10-12: 800 [IU]/h via INTRAVENOUS
  Filled 2017-10-12: qty 250

## 2017-10-12 MED ORDER — SODIUM CHLORIDE 0.9 % IV SOLN: INTRAVENOUS | Status: DC

## 2017-10-12 NOTE — Progress Notes (Signed)
[facility]                                                                              Patient Demographics  Judy Herrera, is a 83 y.o. female, DOB - 12-28-1930, FBP:[id number]  Admit date - 10/11/2017   Admitting Physician Waldemar Dickens, MD  Outpatient Primary MD for the patient is Marletta Lor, MD  Outpatient specialists:   LOS - 1  days   Medical records reviewed and are as summarized below:    Chief Complaint  Patient presents with  . Back Pain  . 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 Marin Olp, history of left breast CA stage III a, osteoporosis presented with right hip and back pain.  X-rays and overall exam was unremarkable admit [facility] on 11/23, she was ambulatory and discharged home.  However, on the morning of admission, she was unable to get out of bed without assistance with severe lower back pain Patient was also noted to be tachycardiac with a heart rate of 110s-120s, with possible A. fib, patient was admitted for further workup.   Assessment & Plan    Principal Problem: Acute lower back pain with sacral fracture (HCC), history of osteoporosis, right L5 vertebral fracture -CT of the L-spine showed L5 transverse fracture and sacral insufficiency fracture -Patient received fentanyl, hydrocodone and IV fluids without significant relief, subsequently admitted -PT OT consult, vitamin D, calcium replacement, pain control, bowel regimen with Senokot  Active Problems:   Hyperlipidemia -Continue simvastatin    Essential hypertension -BP borderline low, discontinue lisinopril, HCTZ, placed on IV fluid hydration -For now continue Coreg, will need to hold if BP low  Atrial fibrillation with RVR with underlying history of CAD, elevated  troponin -Repeat EKG also shows atrial fibrillation.  Patient was started on IV heparin drip although I am not sure she is a good candidate for long-term anticoagulant agent given falls and sacral fracture -Continue Coreg for now, holding ACE inhibitor and HCTZ due to borderline BP -Cardiology following, will obtain 2D echocardiogram.  Per cardiology, do not think she requires urgent cardiac cath.  Anemia of chronic disease -Baseline hemoglobin 9.5, received Feraheme on 11/15 -Follow CBC  Acute renal insufficiency -Likely due to tachycardia and low perfusion, DC HCTZ, ACE inhibitor -Placed on IV fluids, follow creatinine    BREAST CANCER, HX OF, endocrine pleural carcinoma of the skin, LUE -Breast CA, stage IIIa, follows Dr. Marin Olp, continue outpatient management -Received palliative radiation to the left arm, followed by Dr. Marin Olp  Code Status: full  DVT Prophylaxis:  Heparin  Family Communication: Discussed in detail with the patient, all imaging results, lab results explained to the patient   Disposition Plan: PT eval, may need SNF  Time Spent in minutes  35 minutes  Procedures:  None  Consultants:   Cardiology  Antimicrobials:      Medications  Scheduled Meds: . aspirin EC  81 mg Oral QHS  .  carvedilol  25 mg Oral BID WC  . heparin subcutaneous  5,000 Units Subcutaneous Q8H  . lisinopril  20 mg Oral Daily   And  . hydrochlorothiazide  12.5 mg Oral Daily  . megestrol  600 mg Oral Daily  . simvastatin  40 mg Oral QHS   Continuous Infusions: . sodium chloride 75 mL/hr at 10/11/17 2140   PRN Meds:.acetaminophen **OR** acetaminophen, bisacodyl, HYDROcodone-acetaminophen, HYDROmorphone (DILAUDID) injection, ketorolac, loperamide, ondansetron **OR** ondansetron (ZOFRAN) IV, senna-docusate   Antibiotics   Anti-infectives (From admission, onward)   None        Subjective:   Judy Herrera was seen and examined today.  Pain controlled if not moving.  BP  borderline low.  Patient denies dizziness, chest pain, shortness of breath, abdominal pain, N/V/D/C, new weakness, numbess, tingling.   Objective:   Vitals:   10/11/17 2030 10/12/17 0453 10/12/17 0508 10/12/17 0942  BP: (!) 123/106 (!) 86/52 (!) 78/48 (!) 99/54  Pulse: (!) 113 83  83  Resp: 20 18  17   Temp: 98.3 F (36.8 C) 98 F (36.7 C)  98.2 F (36.8 C)  TempSrc: Oral Oral  Axillary  SpO2: 95% 100%  100%  Weight:      Height:        Intake/Output Summary (Last 24 hours) at 10/12/2017 1021 Last data filed at 10/12/2017 0600 Gross per 24 hour  Intake 1076.01 ml  Output 0 ml  Net 1076.01 ml     Wt Readings from Last 3 Encounters:  10/11/17 44.5 kg (98 lb)  10/08/17 44.5 kg (98 lb)  09/30/17 43.1 kg (95 lb)     Exam  General: Alert and oriented x 3, NAD  Eyes:  HEENT:  Atraumatic, normocephalic  Cardiovascular: S1 S2 auscultated, RRR  Respiratory: Clear to auscultation bilaterally, no wheezing, rales or rhonchi  Gastrointestinal: Soft, nontender, nondistended, + bowel sounds  Ext: no pedal edema bilaterally  Neuro: no new deficits, pain in the back with lifting the legs  Musculoskeletal: No digital cyanosis, clubbing  Skin: No rashes  Psych: Normal affect and demeanor, alert and oriented x3    Data Reviewed:  I have personally reviewed following labs and imaging studies  Micro Results No results found for this or any previous visit (from the past 240 hour(s)).  Radiology Reports Dg Chest 1 View  Addendum Date: 10/11/2017   ADDENDUM REPORT: 10/11/2017 11:56 ADDENDUM: These results were called by telephone at the time of interpretation on 10/11/2017 at 11:56 am to Dr. Brantley Stage , who verbally acknowledged these results. Electronically Signed   By: Nolon Nations M.D.   On: 10/11/2017 11:56   Result Date: 10/11/2017 CLINICAL DATA:  New onset AFib. Fell down yesterday. Lower back pain. EXAM: CHEST 1 VIEW COMPARISON:  11/28/2016 FINDINGS: Lungs are  hyperinflated. Heart size is enlarged. There is increased left apical opacity, raising the question of pleural thickening or mass. Further evaluation with CT is recommended. Prominence of the right hilar region appears stable over time. Surgical clips overlie the left axilla. IMPRESSION: 1. Hyperinflation and stable cardiomegaly. 2. Question of mass the left lung apex. Further evaluation is CT of the chest is recommended. Intravenous contrast is recommended unless contraindicated. 3. These results will be called to the ordering clinician or representative by the Radiologist Assistant, and communication documented in the PACS or zVision Dashboard. Electronically Signed: By: Nolon Nations M.D. On: 10/11/2017 11:43   Dg Lumbar Spine Complete  Result Date: 10/11/2017 CLINICAL DATA:  Fall yesterday.  Back pain EXAM: LUMBAR SPINE - COMPLETE 4+ VIEW COMPARISON:  CT abdomen pelvis 05/02/2013 FINDINGS: Normal alignment. Severe chronic compression fracture T12 unchanged from 2014. No acute fracture. Mild disc degeneration L2-3 and L3-4.  Negative for pars defect Advanced atherosclerotic calcification the thoracic and abdominal aorta with prior retroperitoneal surgery and multiple metal clips. Gallbladder clips. IMPRESSION: Severe chronic compression fracture T12.  No acute fracture. Electronically Signed   By: Franchot Gallo M.D.   On: 10/11/2017 11:43   Ct Lumbar Spine Wo Contrast  Result Date: 10/11/2017 CLINICAL DATA:  Severe low back pain after sitting down quickly in a chair. EXAM: CT LUMBAR SPINE WITHOUT CONTRAST TECHNIQUE: Multidetector CT imaging of the lumbar spine was performed without intravenous contrast administration. Multiplanar CT image reconstructions were also generated. COMPARISON:  Lumbar spine radiographs 10/11/2017. CT abdomen and pelvis 05/02/2013. FINDINGS: Segmentation: 5 lumbar type vertebrae. Alignment: Mild upper lumbar levoscoliosis. Trace retrolisthesis of L2 on L3. Vertebrae:  Chronic severe T12 vertebral fracture with diffuse vertebral body sclerosis and complete height loss centrally. T12 vertebral retropulsion measures 5 mm and results in mild spinal stenosis. There are acute mildly displaced fractures through the sacral ala bilaterally. There is also a nondisplaced right L5 transverse process fracture. No suspicious osseous lesion is identified. Paraspinal and other soft tissues: Aortoiliac atherosclerosis without aneurysm. Bilateral renal atrophy. Trace right and small left pleural effusions. Prior cholecystectomy. Disc levels: At L2-3, there is mild-to-moderate disc space narrowing and degenerative endplate sclerosis with circumferential disc bulging resulting in mild right neural foraminal stenosis and likely right lateral recess stenosis. Milder disc bulging is present at L3-4 and L4-5 resulting in mild neural foraminal narrowing but no significant spinal stenosis. IMPRESSION: 1. Acute sacral insufficiency type fractures. 2. Nondisplaced right L5 transverse process fracture. 3. Chronic T12 fracture with vertebra plana configuration, retropulsion, and mild spinal stenosis. 4.  Aortic Atherosclerosis (ICD10-I70.0). Electronically Signed   By: Logan Bores M.D.   On: 10/11/2017 14:05   Dg Hip Unilat  With Pelvis 2-3 Views Right  Result Date: 10/08/2017 CLINICAL DATA:  Right hip pain after fall. EXAM: DG HIP (WITH OR WITHOUT PELVIS) 2-3V RIGHT COMPARISON:  CT abdomen pelvis dated May 02, 2013. FINDINGS: No acute fracture or malalignment. Mild bilateral hip joint space narrowing. The pubic symphysis and sacroiliac joints are intact. Diffuse osteopenia. Multiple surgical clips in the pelvis. Atherosclerotic vascular calcifications. IMPRESSION: No acute osseous abnormality. If occult hip fracture is suspected or if the patient is unable to bear weight, MRI is the preferred modality for further evaluation. Electronically Signed   By: Titus Dubin M.D.   On: 10/08/2017 09:24     Lab Data:  CBC: Recent Labs  Lab 10/11/17 0956  WBC 7.4  HGB 9.5*  HCT 29.4*  MCV 87.0  PLT 295   Basic Metabolic Panel: Recent Labs  Lab 10/11/17 0956 10/12/17 0143  NA 134* 132*  K 3.9 4.1  CL 103 104  CO2 21* 17*  GLUCOSE 142* 121*  BUN 26* 39*  CREATININE 1.09* 1.50*  CALCIUM 8.1* 7.3*  MG 2.0  --    GFR: Estimated Creatinine Clearance: 19.6 mL/min (A) (by C-G formula based on SCr of 1.5 mg/dL (H)). Liver Function Tests: Recent Labs  Lab 10/12/17 0143  AST 28  ALT 24  ALKPHOS 56  BILITOT 2.1*  PROT 5.5*  ALBUMIN 2.7*   No results for input(s): LIPASE, AMYLASE in the last 168 hours. No results for input(s): AMMONIA in the last 168 hours. Coagulation  Profile: Recent Labs  Lab 10/12/17 0143  INR 1.39   Cardiac Enzymes: Recent Labs  Lab 10/11/17 1504 10/11/17 1857 10/11/17 2140 10/12/17 0143  TROPONINI 1.53* 1.66* 1.56* 1.45*   BNP (last 3 results) No results for input(s): PROBNP in the last 8760 hours. HbA1C: No results for input(s): HGBA1C in the last 72 hours. CBG: No results for input(s): GLUCAP in the last 168 hours. Lipid Profile: No results for input(s): CHOL, HDL, LDLCALC, TRIG, CHOLHDL, LDLDIRECT in the last 72 hours. Thyroid Function Tests: Recent Labs    10/11/17 1032  TSH 1.661   Anemia Panel: No results for input(s): VITAMINB12, FOLATE, FERRITIN, TIBC, IRON, RETICCTPCT in the last 72 hours. Urine analysis:    Component Value Date/Time   COLORURINE YELLOW 10/01/2015 1155   APPEARANCEUR CLEAR 10/01/2015 1155   LABSPEC 1.008 10/01/2015 1155   LABSPEC 1.010 05/07/2014 0835   PHURINE 7.5 10/01/2015 1155   GLUCOSEU NEGATIVE 10/01/2015 1155   GLUCOSEU Negative 05/07/2014 0835   HGBUR NEGATIVE 10/01/2015 1155   HGBUR 3+ 07/22/2011 1338   BILIRUBINUR moderate (A) 05/01/2017 1256   BILIRUBINUR negative 01/25/2017 1039   BILIRUBINUR Negative 05/07/2014 0835   KETONESUR trace (5) (A) 05/01/2017 [place]  10/01/2015 1155   PROTEINUR trace (A) 05/01/2017 1256   PROTEINUR negative 01/25/2017 1039   PROTEINUR NEGATIVE 10/01/2015 1155   UROBILINOGEN negative (A) 05/01/2017 1256   UROBILINOGEN 0.2 05/07/2014 0835   NITRITE Positive (A) 05/01/2017 1256   NITRITE negative 01/25/2017 1039   NITRITE NEGATIVE 10/01/2015 1155   LEUKOCYTESUR Large (3+) (A) 05/01/2017 1256   LEUKOCYTESUR Negative 05/07/2014 0835     Bluford Sedler M.D. [facility] 10/12/2017, 10:21 AM  Pager: [phone] Between 7am to 7pm - call Pager - [phone]  After 7pm go to [email or web address] - password [email or web address]  Call night coverage person covering after 7pm

## 2017-10-12 NOTE — Progress Notes (Signed)
[place] for Heparin Indication: atrial fibrillation  Allergies  Allergen Reactions  . Ibuprofen Nausea Only  . Morphine Nausea And Vomiting    Patient Measurements: Height: 5\' 3"  (160 cm) Weight: 98 lb (44.5 kg) IBW/kg (Calculated) : 52.4 Heparin Dosing Weight: 44.5 kg  Vital Signs: Temp: 97.5 F (36.4 C) (11/27 1645) Temp Source: Axillary (11/27 1645) BP: 80/42 (11/27 1759) Pulse Rate: 70 (11/27 1759)  Labs: Recent Labs    10/11/17 0956  10/11/17 1857 10/11/17 2140 10/12/17 0143 10/12/17 2007  HGB 9.5*  --   --   --   --   --   HCT 29.4*  --   --   --   --   --   PLT 182  --   --   --   --   --   LABPROT  --   --   --   --  17.0*  --   INR  --   --   --   --  1.39  --   HEPARINUNFRC  --   --   --   --  0.15* <0.10*  CREATININE 1.09*  --   --   --  1.50*  --   TROPONINI  --    < > 1.66* 1.56* 1.45*  --    < > = values in this interval not displayed.    Estimated Creatinine Clearance: 19.6 mL/min (A) (by C-G formula based on SCr of 1.5 mg/dL (H)).   Medical History: Past Medical History:  Diagnosis Date  . Allergy   . Arthritis   . Breast CA ([place])    NO BP OR STICKS IN LEFT ARM  . CAD (coronary artery disease)   . Cancer ([place])   . Cardiomyopathy ([place])    improved  . Cataract   . Diverticulosis   . Eccrine porocarcinoma of skin   . History of chemotherapy   . History of radiation therapy 1994   left chest wall  . Hyperlipidemia   . Hypertension   . Iron malabsorption 09/03/2017  . Osteoporosis   . Ovarian ca ([place])   . Sacral fracture ([place]) 09/2017  . Wears dentures    full bottom-partial top  . Wears glasses     Assessment: 83 year old female admitted 10/11/17 with severe low back pain and tachycardia and EMS strip suggest new atrial fibrillation. Patient also has elevated troponin at 1.53. CHADsVASC = 4.  -heparin was stopped earlier this am but then resumed  -heparin level < 0.1   Goal of Therapy:   Heparin level 0.3-0.7 units/ml Monitor platelets by anticoagulation protocol: Yes   Plan:  -heparin bolus 1500 units x1 then increase to 950 units/hr -Heparin level in 8 hours and daily wth CBC daily  Hildred Laser, Pharm D 10/12/2017 10:18 PM

## 2017-10-12 NOTE — Progress Notes (Signed)
Pt with automatic B/P 86/52 - Manual B/P 78/48. Remains asymptomatic. Page to [facility] for notification. Dorthey Sawyer, RN

## 2017-10-12 NOTE — Progress Notes (Signed)
Progress Note  Patient Name: Judy Herrera Date of Encounter: 10/12/2017  Primary Cardiologist: Dr. Stanford Breed  Subjective   Resting comfortably in bed, denies CP or SOB. Family present.  Inpatient Medications    Scheduled Meds: . aspirin EC  81 mg Oral QHS  . carvedilol  25 mg Oral BID WC  . megestrol  600 mg Oral Daily  . senna-docusate  1 tablet Oral QHS  . simvastatin  40 mg Oral QHS   Continuous Infusions: . sodium chloride 75 mL/hr at 10/11/17 2140  . heparin 800 Units/hr (10/12/17 1039)   PRN Meds: acetaminophen **OR** acetaminophen, bisacodyl, HYDROcodone-acetaminophen, HYDROmorphone (DILAUDID) injection, ketorolac, loperamide, ondansetron **OR** ondansetron (ZOFRAN) IV   Vital Signs    Vitals:   10/11/17 2030 10/12/17 0453 10/12/17 0508 10/12/17 0942  BP: (!) 123/106 (!) 86/52 (!) 78/48 (!) 99/54  Pulse: (!) 113 83  83  Resp: 20 18  17   Temp: 98.3 F (36.8 C) 98 F (36.7 C)  98.2 F (36.8 C)  TempSrc: Oral Oral  Axillary  SpO2: 95% 100%  100%  Weight:      Height:        Intake/Output Summary (Last 24 hours) at 10/12/2017 1252 Last data filed at 10/12/2017 1000 Gross per 24 hour  Intake 1316.01 ml  Output 100 ml  Net 1216.01 ml   Filed Weights   10/11/17 0938  Weight: 98 lb (44.5 kg)    Telemetry    NSR with 2 brief runs of narrow complex tachycardia - Personally Reviewed  Physical Exam   GEN: No acute distress, frail elderly WF laying flat without difficulty HEENT: Normocephalic, atraumatic, sclera non-icteric. Neck: No JVD or bruits. Cardiac: RRR distant heart sounds without audible murmurs, rubs or gallops  Radials/DP/PT 1+ and equal bilaterally.  Respiratory: Clear to auscultation bilaterally. Breathing is unlabored. GI: Soft, nontender, non-distended, BS +x 4. MS: no deformity. Extremities: No clubbing or cyanosis. No LE edema. +LUE edema. Distal pedal pulses are 2+ and equal bilaterally. Neuro:  AAOx3. Follows  commands. Psych:  Responds to questions appropriately with a normal affect.  Labs    Chemistry Recent Labs  Lab 10/11/17 0956 10/12/17 0143  NA 134* 132*  K 3.9 4.1  CL 103 104  CO2 21* 17*  GLUCOSE 142* 121*  BUN 26* 39*  CREATININE 1.09* 1.50*  CALCIUM 8.1* 7.3*  PROT  --  5.5*  ALBUMIN  --  2.7*  AST  --  28  ALT  --  24  ALKPHOS  --  56  BILITOT  --  2.1*  GFRNONAA 45* 31*  GFRAA 52* 36*  ANIONGAP 10 11     Hematology Recent Labs  Lab 10/11/17 0956  WBC 7.4  RBC 3.38*  HGB 9.5*  HCT 29.4*  MCV 87.0  MCH 28.1  MCHC 32.3  RDW 18.3*  PLT 182    Cardiac Enzymes Recent Labs  Lab 10/11/17 1504 10/11/17 1857 10/11/17 2140 10/12/17 0143  TROPONINI 1.53* 1.66* 1.56* 1.45*   No results for input(s): TROPIPOC in the last 168 hours.   BNPNo results for input(s): BNP, PROBNP in the last 168 hours.   DDimer No results for input(s): DDIMER in the last 168 hours.   Radiology    Dg Chest 1 View  Addendum Date: 10/11/2017   ADDENDUM REPORT: 10/11/2017 11:56 ADDENDUM: These results were called by telephone at the time of interpretation on 10/11/2017 at 11:56 am to Dr. Brantley Stage , who verbally acknowledged  these results. Electronically Signed   By: Nolon Nations M.D.   On: 10/11/2017 11:56   Result Date: 10/11/2017 CLINICAL DATA:  New onset AFib. Fell down yesterday. Lower back pain. EXAM: CHEST 1 VIEW COMPARISON:  11/28/2016 FINDINGS: Lungs are hyperinflated. Heart size is enlarged. There is increased left apical opacity, raising the question of pleural thickening or mass. Further evaluation with CT is recommended. Prominence of the right hilar region appears stable over time. Surgical clips overlie the left axilla. IMPRESSION: 1. Hyperinflation and stable cardiomegaly. 2. Question of mass the left lung apex. Further evaluation is CT of the chest is recommended. Intravenous contrast is recommended unless contraindicated. 3. These results will be called to the  ordering clinician or representative by the Radiologist Assistant, and communication documented in the PACS or zVision Dashboard. Electronically Signed: By: Nolon Nations M.D. On: 10/11/2017 11:43   Dg Lumbar Spine Complete  Result Date: 10/11/2017 CLINICAL DATA:  Fall yesterday.  Back pain EXAM: LUMBAR SPINE - COMPLETE 4+ VIEW COMPARISON:  CT abdomen pelvis 05/02/2013 FINDINGS: Normal alignment. Severe chronic compression fracture T12 unchanged from 2014. No acute fracture. Mild disc degeneration L2-3 and L3-4.  Negative for pars defect Advanced atherosclerotic calcification the thoracic and abdominal aorta with prior retroperitoneal surgery and multiple metal clips. Gallbladder clips. IMPRESSION: Severe chronic compression fracture T12.  No acute fracture. Electronically Signed   By: Franchot Gallo M.D.   On: 10/11/2017 11:43   Ct Lumbar Spine Wo Contrast  Result Date: 10/11/2017 CLINICAL DATA:  Severe low back pain after sitting down quickly in a chair. EXAM: CT LUMBAR SPINE WITHOUT CONTRAST TECHNIQUE: Multidetector CT imaging of the lumbar spine was performed without intravenous contrast administration. Multiplanar CT image reconstructions were also generated. COMPARISON:  Lumbar spine radiographs 10/11/2017. CT abdomen and pelvis 05/02/2013. FINDINGS: Segmentation: 5 lumbar type vertebrae. Alignment: Mild upper lumbar levoscoliosis. Trace retrolisthesis of L2 on L3. Vertebrae: Chronic severe T12 vertebral fracture with diffuse vertebral body sclerosis and complete height loss centrally. T12 vertebral retropulsion measures 5 mm and results in mild spinal stenosis. There are acute mildly displaced fractures through the sacral ala bilaterally. There is also a nondisplaced right L5 transverse process fracture. No suspicious osseous lesion is identified. Paraspinal and other soft tissues: Aortoiliac atherosclerosis without aneurysm. Bilateral renal atrophy. Trace right and small left pleural effusions.  Prior cholecystectomy. Disc levels: At L2-3, there is mild-to-moderate disc space narrowing and degenerative endplate sclerosis with circumferential disc bulging resulting in mild right neural foraminal stenosis and likely right lateral recess stenosis. Milder disc bulging is present at L3-4 and L4-5 resulting in mild neural foraminal narrowing but no significant spinal stenosis. IMPRESSION: 1. Acute sacral insufficiency type fractures. 2. Nondisplaced right L5 transverse process fracture. 3. Chronic T12 fracture with vertebra plana configuration, retropulsion, and mild spinal stenosis. 4.  Aortic Atherosclerosis (ICD10-I70.0). Electronically Signed   By: Logan Bores M.D.   On: 10/11/2017 14:05    Cardiac Studies   2d echo pending  Patient Profile     83 y.o. 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 CXR incidentally noted question of mass in left lung apex. Cardiology consulted for tachycardia and elevated troponin.  Assessment & Plan    1. Back pain with acute fractures - concerning in setting of malignancy - occurred after she sat down "hard" on a recliner. Also with possible new lung mass in face of prior malignancies. Further per IM.  2. Elevated troponin - flat trend albeit higher than one would expect for simple demand ischemia. Could be due to tachycardia but cannot exclude underlying progressive CAD. She has been on heparin (for inadvertant dx of atrial fib), will discuss duration (I.e. 48 hours) with Dr. Sallyanne Kuster. She is on aspirin, beta blocker, and statin. Agree it is hard to understand the significance of this troponin in the setting of her  presentation. Could consider ischemic evaluation at some point but her overall frailty and comorbidities make her a less ideal candidate for invasive evaluation. Consideration could also be given to medical therapy with DAPT. Await echo. Will defer to primary team whether evaluation for PE should be considered as an alternative explanation for this value.  3. Tachycardia, with PSVT - Dr. Sallyanne Kuster did not feel rhythm represented atrial fib or flutter. She is in NSR with improved HR rate control today but has had 2 episodes of narrow complex regular tachycardia suspected to be atrial tach. She was asymptomatic with this. Continue beta blocker. If she develops increased paroxysms, could consider diltiazem.  4. Murmur - unable to appreciate today but heart sounds are distant, await echo.  For questions or updates, please contact [place] Please consult [email or web address] for contact info under Cardiology/STEMI.  Signed, Charlie Pitter, PA-C 10/12/2017, 12:52 PM    I have seen and examined the patient along with Charlie Pitter, PA-C.  I have reviewed the chart, notes and new data.  I agree with PA's note.  Key new complaints: feels better Key examination changes: gradual reduction in HR is consistent with NSR; true arrhythmia would stop abruptly.  Key new findings / data: NSR on monitor. She has had only very brief SVT (PAT).  PLAN: Await echo. Brief runs of true PAT do not require a change in current therapy. Her troponin "plateau" elevation remains unexplained, but is not at all consistent with a coronary event, acute pulmonary embolism. A smoldering myocarditis might do that, but her clinical presentation does not appear compatible. Cross reactivity of troponin I assay with some rheumatoid factors has been reported.  Sanda Klein, MD, [place] [phone] 10/12/2017, 1:44 PM

## 2017-10-12 NOTE — Evaluation (Signed)
Physical Therapy Evaluation Patient Details Name: Judy Herrera MRN: [id number] DOB: 06-09-1931 Today's Date: 10/12/2017   History of Present Illness  83 y.o. 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 CXR incidentally noted question of mass in left lung apex  Clinical Impression  Orders received for PT evaluation. Patient demonstrates deficits in functional mobility as indicated below. Will benefit from continued skilled PT to address deficits and maximize function. Will see as indicated and progress as tolerated.  At this time, feel patient will likely need post acute rehabilitation as patient resides home alone and was independent prior to admission.    Follow Up Recommendations SNF;Supervision/Assistance - 24 hour    Equipment Recommendations  (TBD)    Recommendations for Other Services       Precautions / Restrictions Precautions Precautions: Fall Precaution Comments: left UE restrictions      Mobility  Bed Mobility Overal bed mobility: Needs Assistance Bed Mobility: Rolling;Sidelying to Sit;Sit to Sidelying Rolling: Min assist Sidelying to sit: Max assist     Sit to sidelying: Mod assist General bed mobility comments: max assist to elevate trunk to upright and come to EOB due to pain  Transfers Overall transfer level: Needs assistance Equipment used: 2 person hand held assist Transfers: Sit to/from Stand;Stand Pivot Transfers Sit to Stand: Max assist;+2 physical assistance Stand pivot transfers: Max assist;+2 physical assistance       General transfer comment: Max assist during side by side and face to face transfer.  Patient unable to tolerate more then pivotal steps at this time  Ambulation/Gait             General Gait Details: unable to perform  Stairs            Wheelchair Mobility    Modified Rankin (Stroke Patients Only)       Balance Overall balance assessment: Needs assistance   Sitting balance-Leahy Scale: Poor Sitting balance - Comments: limited sitting activity tolerance due to sacral pain   Standing balance support: During functional activity Standing balance-Leahy Scale: Poor Standing balance comment: moderate to max assist                             Pertinent Vitals/Pain Pain Assessment: 0-10 Pain Score: 5  Pain Location: buttocks Pain Descriptors / Indicators: Constant;Discomfort;Sore;Sharp Pain Intervention(s): Limited activity within patient's tolerance;Monitored during session;Repositioned    Home Living Family/patient expects to be discharged to:: Private residence Living Arrangements: Alone Available Help at Discharge: Family;Available PRN/intermittently Type of Home: House Home Access: Stairs to enter Entrance Stairs-Rails: Can reach both Entrance Stairs-Number of Steps: 2 Home Layout: One level        Prior Function Level of Independence: Independent         Comments: patient walks and goes to the gym, fiercely independent as described by family     Hand Dominance   Dominant Hand: Right    Extremity/Trunk Assessment   Upper Extremity Assessment Upper Extremity Assessment: Generalized weakness    Lower Extremity Assessment Lower Extremity Assessment: Generalized weakness(difficult to assess due to pain)    Cervical / Trunk Assessment Cervical /  Trunk Assessment: Kyphotic  Communication   Communication: No difficulties  Cognition Arousal/Alertness: Awake/alert Behavior During Therapy: Anxious Overall Cognitive Status: Within Functional Limits for tasks assessed                                         General Comments      Exercises     Assessment/Plan    PT Assessment Patient needs continued PT services  PT Problem List Decreased strength;Decreased activity tolerance;Decreased balance;Decreased mobility;Cardiopulmonary status limiting activity;Pain       PT Treatment Interventions DME instruction;Gait training;Functional mobility training;Therapeutic activities;Therapeutic exercise;Balance training;Neuromuscular re-education;Patient/family education    PT Goals (Current goals can be found in the Care Plan section)  Acute Rehab PT Goals Patient Stated Goal: to go home PT Goal Formulation: With patient/family Time For Goal Achievement: 10/26/17 Potential to Achieve Goals: Good    Frequency Min 3X/week   Barriers to discharge        Co-evaluation               AM-PAC PT "6 Clicks" Daily Activity  Outcome Measure Difficulty turning over in bed (including adjusting bedclothes, sheets and blankets)?: Unable Difficulty moving from lying on back to sitting on the side of the bed? : Unable Difficulty sitting down on and standing up from a chair with arms (e.g., wheelchair, bedside commode, etc,.)?: Unable Help needed moving to and from a bed to chair (including a wheelchair)?: A Lot Help needed walking in hospital room?: A Lot Help needed climbing 3-5 steps with a railing? : Total 6 Click Score: 8    End of Session Equipment Utilized During Treatment: Oxygen Activity Tolerance: Patient limited by pain Patient left: in bed;with call bell/phone within reach;with family/visitor present;Other (comment)(with echo team) Nurse Communication: Mobility status PT Visit Diagnosis: Muscle weakness (generalized) (M62.81);Difficulty in walking, not elsewhere classified (R26.2)    Time: 8185-6314 PT Time Calculation (min) (ACUTE ONLY): 22 min   Charges:   PT Evaluation $PT Eval Moderate Complexity: 1 Mod     PT G Codes:        Alben Deeds, PT DPT  Board Certified  Neurologic Specialist [place] 10/12/2017, 3:28 PM

## 2017-10-12 NOTE — Progress Notes (Signed)
[facility] for Heparin Indication: atrial fibrillation  Allergies  Allergen Reactions  . Ibuprofen Nausea Only  . Morphine Nausea And Vomiting    Patient Measurements: Height: 5\' 3"  (160 cm) Weight: 98 lb (44.5 kg) IBW/kg (Calculated) : 52.4 Heparin Dosing Weight: 44.5 kg  Vital Signs: Temp: 98.3 F (36.8 C) (11/26 2030) Temp Source: Oral (11/26 2030) BP: 123/106 (11/26 2030) Pulse Rate: 113 (11/26 2030)  Labs: Recent Labs    10/11/17 0956 10/11/17 1504 10/11/17 1857 10/11/17 2140 10/12/17 0143  HGB 9.5*  --   --   --   --   HCT 29.4*  --   --   --   --   PLT 182  --   --   --   --   LABPROT  --   --   --   --  17.0*  INR  --   --   --   --  1.39  HEPARINUNFRC  --   --   --   --  0.15*  CREATININE 1.09*  --   --   --   --   TROPONINI  --  1.53* 1.66* 1.56*  --     Estimated Creatinine Clearance: 27 mL/min (A) (by C-G formula based on SCr of 1.09 mg/dL (H)).  Assessment: 83 year old female with Afib for heparin  Goal of Therapy:  Heparin level 0.3-0.7 units/ml Monitor platelets by anticoagulation protocol: Yes   Plan:  Heparin 1500 units IV bolus, then increase heparin 800 units Check heparin level in 8 hours.   Phillis Knack, PharmD, BCPS  10/12/2017,2:54 AM

## 2017-10-12 NOTE — Progress Notes (Signed)
[facility] for Heparin Indication: atrial fibrillation  Allergies  Allergen Reactions  . Ibuprofen Nausea Only  . Morphine Nausea And Vomiting    Patient Measurements: Height: 5\' 3"  (160 cm) Weight: 98 lb (44.5 kg) IBW/kg (Calculated) : 52.4 Heparin Dosing Weight: 44.5 kg  Vital Signs: Temp: 98.2 F (36.8 C) (11/27 0942) Temp Source: Axillary (11/27 0942) BP: 99/54 (11/27 0942) Pulse Rate: 83 (11/27 0942)  Labs: Recent Labs    10/11/17 0956  10/11/17 1857 10/11/17 2140 10/12/17 0143  HGB 9.5*  --   --   --   --   HCT 29.4*  --   --   --   --   PLT 182  --   --   --   --   LABPROT  --   --   --   --  17.0*  INR  --   --   --   --  1.39  HEPARINUNFRC  --   --   --   --  0.15*  CREATININE 1.09*  --   --   --  1.50*  TROPONINI  --    < > 1.66* 1.56* 1.45*   < > = values in this interval not displayed.    Estimated Creatinine Clearance: 19.6 mL/min (A) (by C-G formula based on SCr of 1.5 mg/dL (H)).   Medical History: Past Medical History:  Diagnosis Date  . Allergy   . Arthritis   . Breast CA ([place])    NO BP OR STICKS IN LEFT ARM  . CAD (coronary artery disease)   . Cancer ([place])   . Cardiomyopathy ([place])    improved  . Cataract   . Diverticulosis   . Eccrine porocarcinoma of skin   . History of chemotherapy   . History of radiation therapy 1994   left chest wall  . Hyperlipidemia   . Hypertension   . Iron malabsorption 09/03/2017  . Osteoporosis   . Ovarian ca ([place])   . Wears dentures    full bottom-partial top  . Wears glasses     Assessment: 83 year old female admitted 10/11/17 with severe low back pain and tachycardia and EMS strip suggest new atrial fibrillation. Patient also has elevated troponin at 1.53. CHADsVASC = 4.   Heparin stopped earlier this am but to resume as concern for afib persist. CBC from 11/26 stable.   Goal of Therapy:  Heparin level 0.3-0.7 units/ml Monitor platelets by  anticoagulation protocol: Yes   Plan:  1. Resume heparin infusion at 800 units/hr  2. Heparin level in 8 hours and daily   Vincenza Hews, PharmD, BCPS 10/12/2017, 10:38 AM

## 2017-10-12 NOTE — Progress Notes (Signed)
Pt with automatic B/P 86/52 - Manual B/P 78/48. Remains asymptomatic. Secondary page sent to K. Schorr for notification. Dorthey Sawyer, RN

## 2017-10-12 NOTE — Progress Notes (Signed)
New order for 500 cc bolus per Dr. Silas [place]. Dorthey Sawyer, RN

## 2017-10-12 NOTE — Progress Notes (Signed)
  Echocardiogram 2D Echocardiogram has been performed.  Tresa Res 10/12/2017, 4:17 PM

## 2017-10-13 ENCOUNTER — Inpatient Hospital Stay (HOSPITAL_COMMUNITY): Payer: Medicare HMO

## 2017-10-13 DIAGNOSIS — I959 Hypotension, unspecified: Secondary | ICD-10-CM

## 2017-10-13 DIAGNOSIS — I5042 Chronic combined systolic (congestive) and diastolic (congestive) heart failure: Secondary | ICD-10-CM

## 2017-10-13 DIAGNOSIS — N179 Acute kidney failure, unspecified: Secondary | ICD-10-CM

## 2017-10-13 LAB — BASIC METABOLIC PANEL
ANION GAP: 8 (ref 5–15)
BUN: 67 mg/dL — ABNORMAL HIGH (ref 6–20)
CALCIUM: 6.4 mg/dL — AB (ref 8.9–10.3)
CO2: 17 mmol/L — ABNORMAL LOW (ref 22–32)
CREATININE: 2.4 mg/dL — AB (ref 0.44–1.00)
Chloride: 108 mmol/L (ref 101–111)
GFR, EST AFRICAN AMERICAN: 20 mL/min — AB (ref >60)
GFR, EST NON AFRICAN AMERICAN: 17 mL/min — AB (ref >60)
GLUCOSE: 106 mg/dL — AB (ref 65–99)
Potassium: 4.2 mmol/L (ref 3.5–5.1)
Sodium: 133 mmol/L — ABNORMAL LOW (ref 135–145)

## 2017-10-13 LAB — CBC
HCT: 27.5 % — ABNORMAL LOW (ref 36.0–46.0)
Hemoglobin: 8.9 g/dL — ABNORMAL LOW (ref 12.0–15.0)
MCH: 28.9 pg (ref 26.0–34.0)
MCHC: 32.4 g/dL (ref 30.0–36.0)
MCV: 89.3 fL (ref 78.0–100.0)
PLATELETS: 152 10*3/uL (ref 150–400)
RBC: 3.08 MIL/uL — ABNORMAL LOW (ref 3.87–5.11)
RDW: 19.4 % — AB (ref 11.5–15.5)
WBC: 4.7 10*3/uL (ref 4.0–10.5)

## 2017-10-13 LAB — HEPARIN LEVEL (UNFRACTIONATED): Heparin Unfractionated: <0.1 IU/mL — ABNORMAL LOW (ref 0.30–0.70)

## 2017-10-13 LAB — D-DIMER, QUANTITATIVE: D-Dimer, Quant: 3.5 ug/mL-FEU — ABNORMAL HIGH (ref 0.00–0.50)

## 2017-10-13 MED ORDER — TECHNETIUM TC 99M DIETHYLENETRIAME-PENTAACETIC ACID
30.0000 | Freq: Once | INTRAVENOUS | Status: AC | PRN
  Administered 2017-10-13: 30 via RESPIRATORY_TRACT

## 2017-10-13 MED ORDER — MEGESTROL ACETATE 400 MG/10ML PO SUSP
800.0000 mg | Freq: Every day | ORAL | Status: DC
  Administered 2017-10-13: 800 mg via ORAL
  Filled 2017-10-13: qty 20

## 2017-10-13 MED ORDER — CALCIUM GLUCONATE 10 % IV SOLN
1.0000 g | Freq: Once | INTRAVENOUS | Status: AC
  Administered 2017-10-13: 1 g via INTRAVENOUS
  Filled 2017-10-13: qty 10

## 2017-10-13 MED ORDER — TECHNETIUM TO 99M ALBUMIN AGGREGATED
4.0000 | Freq: Once | INTRAVENOUS | Status: AC | PRN
  Administered 2017-10-13: 4 via INTRAVENOUS

## 2017-10-13 MED ORDER — HEPARIN SODIUM (PORCINE) 5000 UNIT/ML IJ SOLN
5000.0000 [IU] | Freq: Three times a day (TID) | INTRAMUSCULAR | Status: DC
  Administered 2017-10-13 – 2017-10-21 (×22): 5000 [IU] via SUBCUTANEOUS
  Filled 2017-10-13 (×22): qty 1

## 2017-10-13 NOTE — Evaluation (Signed)
Clinical/Bedside Swallow Evaluation Patient Details  Name: Judy Herrera MRN: [id number] Date of Birth: 05/31/1931  Today's Date: 10/13/2017 Time: SLP Start Time (ACUTE ONLY): 50 SLP Stop Time (ACUTE ONLY): 1045 SLP Time Calculation (min) (ACUTE ONLY): 27 min  Past Medical History:  Past Medical History:  Diagnosis Date  . Allergy   . Arthritis   . Breast CA ([place])    NO BP OR STICKS IN LEFT ARM  . CAD (coronary artery disease)   . Cancer ([place])   . Cardiomyopathy ([place])    improved  . Cataract   . Diverticulosis   . Eccrine porocarcinoma of skin   . History of chemotherapy   . History of radiation therapy 1994   left chest wall  . Hyperlipidemia   . Hypertension   . Iron malabsorption 09/03/2017  . Osteoporosis   . Ovarian ca ([place])   . Sacral fracture ([place]) 09/2017  . Wears dentures    full bottom-partial top  . Wears glasses    Past Surgical History:  Past Surgical History:  Procedure Laterality Date  . ABDOMINAL HYSTERECTOMY  2004  . BREAST SURGERY    . CHOLECYSTECTOMY  1993  . EYE SURGERY     CATARACTS  . left breast mastectomy  1994   T3 N1 Mx  . MASS EXCISION Left 05/09/2014   Procedure: EXCISION OF NODULE LEFT CHEST ;  Surgeon: Pedro Earls, MD;  Location: [facility];  Service: General;  Laterality: Left;  Marland Kitchen MODIFIED RADICAL MASTECTOMY W/ AXILLARY LYMPH NODE DISSECTION W/ PECTORALIS MINOR EXCISION  1994   Left  . right leg  1990   fx  . SHOULDER ARTHROSCOPY    . [place]   replacement-lt  . SKIN BIOPSY  04/27/2011   left hand, forearm and posterior arm   HPI:  83 yo female adm to [facility] with sacral fx.  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  Concern for pt aspirating with taking medications noted yesterday.  Pt has h/o dysphagia and is s/p esophagrams, MBS study.  She aspirated water during MBS when taking barium tablet in 10/2015.   Has esophageal dysmotility per imaging studies.  Pt denies requiring dilatation in the past and states overall her swallow has improved.    Assessment / Plan / Recommendation Clinical Impression  Patient known to SLP from prior MBS in 10/2015 - her swallow clinically appears similar.  Suspect aspiration occuring mostly with mixed consistencies and overall adequate tolerance of liquids and solids alone.  No indication of airway compromise with thin via straw and bacon.    Of note, positioning is an issue for this pt due to her pain.  Advised to placing pillow behind head for maximal neutral positioning.   Informed her to increased aspiration pneumonia risk if she does aspirate due to her decreased mobility.    Recommend continue regular/thin diet - starting intake with liquids and medicine either liquid form or with applesauce.  Advise esophageal precautions given pt h/o breast cancer/treatments/surgeries.  Written information shared with pt using teach back.    No SlP follow up indicated as all education completed to help pt mitigate dysphagia.  Thanks for this consult.  SLP Visit Diagnosis: Dysphagia, unspecified (R13.10)    Aspiration Risk  Mild aspiration risk    Diet Recommendation Regular;Thin liquid   Liquid Administration via: Straw Medication Administration: Whole meds with puree Supervision:  Patient able to self feed Compensations: Minimize environmental distractions;Slow rate;Small sips/bites;Other (Comment)(start meals with liquids, drink liquids t/o meal) Postural Changes: Seated upright at 90 degrees;Remain upright for at least 30 minutes after po intake    Other  Recommendations Oral Care Recommendations: Oral care BID   Follow up Recommendations   n/a     Frequency and Duration     n/a       Prognosis   n/a     Swallow Study   General Date of Onset: 10/13/17 HPI: 83 yo female adm to [facility] with sacral fx.  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  Concern for pt aspirating with taking medications noted yesterday.  Pt has h/o dysphagia and is s/p esophagrams, MBS study.  She aspirated water during MBS when taking barium tablet in 10/2015.  Has esophageal dysmotility per imaging studies.  Pt denies requiring dilatation in the past and states overall her swallow has improved.  Type of Study: Bedside Swallow Evaluation Diet Prior to this Study: Regular;Thin liquids Respiratory Status: Nasal cannula History of Recent Intubation: No Behavior/Cognition: Alert;Cooperative;Pleasant mood Oral Cavity Assessment: Within Functional Limits Oral Care Completed by SLP: No Oral Cavity - Dentition:  partial, full lower Vision: Functional for self-feeding Self-Feeding Abilities: Needs set up Patient Positioning: Partially reclined(due to gross pain with HOB elevation) Baseline Vocal Quality: Normal Volitional Cough: Other (Comment)(pt with pain with cough) Volitional Swallow: Able to elicit    Oral/Motor/Sensory Function Overall Oral Motor/Sensory Function: Within functional limits(? mild left facial asymmetry, pt reports this to be baseline but uncertain to length of time)   Ice Chips Ice chips: Not tested   Thin Liquid Thin Liquid: Within functional limits Presentation: Self Fed;Straw    Nectar Thick Nectar Thick Liquid: Not tested   Honey Thick Honey Thick Liquid: Not tested   Puree Puree: Not tested   Solid   GO   Solid: Within functional limits Presentation: Self Fredirick Lathe 10/13/2017,10:58 AM  Luanna Salk, [facility] SLP [phone]

## 2017-10-13 NOTE — Progress Notes (Signed)
OT Cancellation Note  Patient Details Name: Judy Herrera MRN: [id number] DOB: 1931/07/30   Cancelled Treatment:    Reason Eval/Treat Not Completed: Patient at procedure or test/ unavailable.  Pt going for D-Dimer.   Will reattempt as schedule allows and as medically appropriate.   [place], OTR/L [phone]   Lucille Passy M 10/13/2017, 12:12 PM

## 2017-10-13 NOTE — Progress Notes (Signed)
Progress Note  Patient Name: Judy Herrera Date of Encounter: 10/13/2017  Primary Cardiologist: Stanford Breed  Subjective   No CV complaints, but running low BP. Anti-HTN meds on hold. Remains in NSR. Echo shows a slight reduction in EF (40-45% vs 50% in 2014-2015-2016). No regional wall motion abnormalities were seen. Moderate MR. Worsening renal parameters.  Inpatient Medications    Scheduled Meds: . aspirin EC  81 mg Oral QHS  . heparin subcutaneous  5,000 Units Subcutaneous Q8H  . megestrol  800 mg Oral Daily  . senna-docusate  1 tablet Oral QHS  . simvastatin  40 mg Oral QHS   Continuous Infusions: . sodium chloride 75 mL/hr at 10/12/17 1440   PRN Meds: acetaminophen **OR** acetaminophen, bisacodyl, HYDROcodone-acetaminophen, HYDROmorphone (DILAUDID) injection, loperamide, ondansetron **OR** ondansetron (ZOFRAN) IV   Vital Signs    Vitals:   10/12/17 1759 10/12/17 2221 10/13/17 0500 10/13/17 0819  BP: (!) 80/42 (!) 88/41 (!) 98/43 (!) 89/51  Pulse: 70 74 76 81  Resp:  18 16 17   Temp:  98.9 F (37.2 C) 98.6 F (37 C) 97.8 F (36.6 C)  TempSrc:  Oral Oral Oral  SpO2:  98% 99% 100%  Weight:      Height:        Intake/Output Summary (Last 24 hours) at 10/13/2017 1010 Last data filed at 10/13/2017 0600 Gross per 24 hour  Intake 1848.05 ml  Output 100 ml  Net 1748.05 ml   Filed Weights   10/11/17 0938  Weight: 98 lb (44.5 kg)    Telemetry    SR, occ PACs and oc PVCs - Personally Reviewed  ECG    No new tracing - Personally Reviewed  Physical Exam  Comfortable, lying flat GEN: No acute distress.   Neck: No JVD Cardiac: RRR, no diastolic murmurs, rubs, or gallops. Faint squeaky apical systolic murmur Respiratory: Clear to auscultation bilaterally. GI: Soft, nontender, non-distended  MS: No edema; No deformity. Neuro:  Nonfocal  Psych: Normal affect   Labs    Chemistry Recent Labs  Lab 10/11/17 0956 10/12/17 0143 10/13/17 0636  NA  134* 132* 133*  K 3.9 4.1 4.2  CL 103 104 108  CO2 21* 17* 17*  GLUCOSE 142* 121* 106*  BUN 26* 39* 67*  CREATININE 1.09* 1.50* 2.40*  CALCIUM 8.1* 7.3* 6.4*  PROT  --  5.5*  --   ALBUMIN  --  2.7*  --   AST  --  28  --   ALT  --  24  --   ALKPHOS  --  56  --   BILITOT  --  2.1*  --   GFRNONAA 45* 31* 17*  GFRAA 52* 36* 20*  ANIONGAP 10 11 8      Hematology Recent Labs  Lab 10/11/17 0956 10/13/17 0636  WBC 7.4 4.7  RBC 3.38* 3.08*  HGB 9.5* 8.9*  HCT 29.4* 27.5*  MCV 87.0 89.3  MCH 28.1 28.9  MCHC 32.3 32.4  RDW 18.3* 19.4*  PLT 182 152    Cardiac Enzymes Recent Labs  Lab 10/11/17 1504 10/11/17 1857 10/11/17 2140 10/12/17 0143  TROPONINI 1.53* 1.66* 1.56* 1.45*   No results for input(s): TROPIPOC in the last 168 hours.   BNPNo results for input(s): BNP, PROBNP in the last 168 hours.   DDimer No results for input(s): DDIMER in the last 168 hours.   Radiology    Dg Chest 1 View  Addendum Date: 10/11/2017   ADDENDUM REPORT: 10/11/2017 11:56  ADDENDUM: These results were called by telephone at the time of interpretation on 10/11/2017 at 11:56 am to Dr. Brantley Stage , who verbally acknowledged these results. Electronically Signed   By: Nolon Nations M.D.   On: 10/11/2017 11:56   Result Date: 10/11/2017 CLINICAL DATA:  New onset AFib. Fell down yesterday. Lower back pain. EXAM: CHEST 1 VIEW COMPARISON:  11/28/2016 FINDINGS: Lungs are hyperinflated. Heart size is enlarged. There is increased left apical opacity, raising the question of pleural thickening or mass. Further evaluation with CT is recommended. Prominence of the right hilar region appears stable over time. Surgical clips overlie the left axilla. IMPRESSION: 1. Hyperinflation and stable cardiomegaly. 2. Question of mass the left lung apex. Further evaluation is CT of the chest is recommended. Intravenous contrast is recommended unless contraindicated. 3. These results will be called to the ordering clinician  or representative by the Radiologist Assistant, and communication documented in the PACS or zVision Dashboard. Electronically Signed: By: Nolon Nations M.D. On: 10/11/2017 11:43   Dg Lumbar Spine Complete  Result Date: 10/11/2017 CLINICAL DATA:  Fall yesterday.  Back pain EXAM: LUMBAR SPINE - COMPLETE 4+ VIEW COMPARISON:  CT abdomen pelvis 05/02/2013 FINDINGS: Normal alignment. Severe chronic compression fracture T12 unchanged from 2014. No acute fracture. Mild disc degeneration L2-3 and L3-4.  Negative for pars defect Advanced atherosclerotic calcification the thoracic and abdominal aorta with prior retroperitoneal surgery and multiple metal clips. Gallbladder clips. IMPRESSION: Severe chronic compression fracture T12.  No acute fracture. Electronically Signed   By: Franchot Gallo M.D.   On: 10/11/2017 11:43   Ct Lumbar Spine Wo Contrast  Result Date: 10/11/2017 CLINICAL DATA:  Severe low back pain after sitting down quickly in a chair. EXAM: CT LUMBAR SPINE WITHOUT CONTRAST TECHNIQUE: Multidetector CT imaging of the lumbar spine was performed without intravenous contrast administration. Multiplanar CT image reconstructions were also generated. COMPARISON:  Lumbar spine radiographs 10/11/2017. CT abdomen and pelvis 05/02/2013. FINDINGS: Segmentation: 5 lumbar type vertebrae. Alignment: Mild upper lumbar levoscoliosis. Trace retrolisthesis of L2 on L3. Vertebrae: Chronic severe T12 vertebral fracture with diffuse vertebral body sclerosis and complete height loss centrally. T12 vertebral retropulsion measures 5 mm and results in mild spinal stenosis. There are acute mildly displaced fractures through the sacral ala bilaterally. There is also a nondisplaced right L5 transverse process fracture. No suspicious osseous lesion is identified. Paraspinal and other soft tissues: Aortoiliac atherosclerosis without aneurysm. Bilateral renal atrophy. Trace right and small left pleural effusions. Prior  cholecystectomy. Disc levels: At L2-3, there is mild-to-moderate disc space narrowing and degenerative endplate sclerosis with circumferential disc bulging resulting in mild right neural foraminal stenosis and likely right lateral recess stenosis. Milder disc bulging is present at L3-4 and L4-5 resulting in mild neural foraminal narrowing but no significant spinal stenosis. IMPRESSION: 1. Acute sacral insufficiency type fractures. 2. Nondisplaced right L5 transverse process fracture. 3. Chronic T12 fracture with vertebra plana configuration, retropulsion, and mild spinal stenosis. 4.  Aortic Atherosclerosis (ICD10-I70.0). Electronically Signed   By: Logan Bores M.D.   On: 10/11/2017 14:05    Cardiac Studies   10/12/2017 echo - Left ventricle: The cavity size was normal. Wall thickness was   increased in a pattern of moderate LVH. Systolic function was   mildly to moderately reduced. The estimated ejection fraction was   in the range of 40% to 45%. Diffuse hypokinesis. Diastolic   dysfunction with elevated LV filling pressure. - Mitral valve: Moderate to severe MAC. Leaflet thickening.  Moderate regurgitation. - Left atrium: The atrium was mildly dilated. - Right ventricle: The cavity size was mildly dilated. Moderately   reduced systolic function. - Right atrium: The atrium was normal in size. - Tricuspid valve: There was mild regurgitation. - Pulmonary arteries: PA peak pressure: 34 mm Hg (S). - Inferior vena cava: The vessel was dilated. The respirophasic   diameter changes were blunted (< 50%), consistent with elevated   central venous pressure.  Impressions:  - Compared to a prior study in 2016, the LVEF is lower at 40-45%.  Patient Profile     83 y.o. female with history of nonischemic CMP, minor CAD, s/p fall w sacral fracture, now with hypotension and worsening renal function. Artifact created impression of possible atrial fibrillation, not confirmed. Unexplained rise in  troponin I and echo with new RV dysfunction and slight worsening of left ventricular systolic function.  Assessment & Plan    1. CHF:  Echo w slight reduction in LV function and signs of increased fluid load, but clinically without signs of HF decompensation. HF meds on hold due to hypotension and ARF. 2. Hypotension: together w constellation of echo RV dilation/dysfunction, elevated troponin, worsening renal function raises the concern for posible pulmonary embolism after her fall. V/Q ordered today. Heparin IV. 3. ARF: precludes use of IV contrast. Suspect due to hypotension/hypoperfusion.  For questions or updates, please contact [place] Please consult [email or web address] for contact info under Cardiology/STEMI.      Signed, Sanda Klein, MD  10/13/2017, 10:10 AM

## 2017-10-13 NOTE — Progress Notes (Signed)
Physical Therapy Treatment Patient Details Name: Judy Herrera MRN: [id number] DOB: 11-14-1931 Today's Date: 10/13/2017    History of Present Illness Pt is a 83 y.o. 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 CXR incidentally noted question of mass in left lung apex.   PT Comments    Pt slowly progressing with mobility, but remains limited by increased pain with BLE movement. Able to take steps with bilat HHA and modA+2. Pt very pleasant and motivated to participate despite pain. Still feel she will benefit from SNF-level therapies at d/c to maximize functional mobility and independence. Will continue to follow acutely.   Follow Up Recommendations  SNF;Supervision/Assistance - 24 hour     Equipment Recommendations  Other (comment)(TBD)    Recommendations for Other Services       Precautions / Restrictions Precautions Precautions: Fall Restrictions Weight Bearing Restrictions: No    Mobility  Bed Mobility Overal bed mobility: Needs Assistance Bed Mobility: Supine to Sit     Supine to sit: Mod assist;+2 for physical assistance     General bed mobility comments: ModA+2 with helicopter technique to sit EOB secondary to increased pain with BLE movement. Once in recliner, pt able to scoot back using BUEs with minA+2  Transfers Overall transfer level: Needs assistance Equipment used: 2 person hand held assist Transfers: Sit to/from Stand Sit to Stand: Mod assist;+2 physical assistance         General transfer comment: ModA+2 with side by side transfer and bilat HHA, stood x1 with pt having to return to sit secondary to pain. Stood  again to amb to chair  Ambulation/Gait Ambulation/Gait assistance: Mod assist;+2 physical assistance Ambulation Distance (Feet): 2 Feet Assistive device: 2 person hand held assist Gait Pattern/deviations: Step-to pattern;Antalgic;Trunk flexed Gait velocity: Decreased Gait velocity interpretation: <1.8 ft/sec, indicative of risk for recurrent falls General Gait Details: Took steps from bed to chair with modA+2 and bilat HHA   Stairs            Wheelchair Mobility    Modified Rankin (Stroke Patients Only)       Balance Overall balance assessment: Needs assistance   Sitting balance-Leahy Scale: Fair       Standing balance-Leahy Scale: Poor Standing balance comment: Mod-maxA with UE support                            Cognition Arousal/Alertness: Awake/alert Behavior During Therapy: Anxious Overall Cognitive Status: Within Functional Limits for tasks assessed                                        Exercises      General Comments General comments (skin integrity, edema, etc.): SpO2 >94% on RA      Pertinent Vitals/Pain Pain Assessment: Faces Faces Pain Scale: Hurts even more Pain Location: Lower back Pain Descriptors / Indicators: Grimacing;Aching Pain Intervention(s): Limited activity within patient's tolerance;Monitored during session    Home Living  Prior Function            PT Goals (current goals can now be found in the care plan section) Acute Rehab PT Goals Patient Stated Goal: to go home PT Goal Formulation: With patient/family Time For Goal Achievement: 10/26/17 Potential to Achieve Goals: Good Progress towards PT goals: Progressing toward goals    Frequency    Min 3X/week      PT Plan Current plan remains appropriate    Co-evaluation PT/OT/SLP Co-Evaluation/Treatment: Yes Reason for Co-Treatment: For patient/therapist safety;To address functional/ADL transfers PT goals  addressed during session: Mobility/safety with mobility;Balance        AM-PAC PT "6 Clicks" Daily Activity  Outcome Measure  Difficulty turning over in bed (including adjusting bedclothes, sheets and blankets)?: Unable Difficulty moving from lying on back to sitting on the side of the bed? : Unable Difficulty sitting down on and standing up from a chair with arms (e.g., wheelchair, bedside commode, etc,.)?: Unable Help needed moving to and from a bed to chair (including a wheelchair)?: A Lot Help needed walking in hospital room?: A Lot Help needed climbing 3-5 steps with a railing? : A Lot 6 Click Score: 9    End of Session Equipment Utilized During Treatment: Gait belt Activity Tolerance: Patient tolerated treatment well;Patient limited by pain Patient left: in chair;with call bell/phone within reach Nurse Communication: Mobility status PT Visit Diagnosis: Muscle weakness (generalized) (M62.81);Difficulty in walking, not elsewhere classified (R26.2)     Time: 0350-0938 PT Time Calculation (min) (ACUTE ONLY): 18 min  Charges:  $Therapeutic Activity: 8-22 mins                    G Codes:      Mabeline Caras, PT, DPT Acute Rehab Services  Pager: [place] 10/13/2017, 4:23 PM

## 2017-10-13 NOTE — Progress Notes (Signed)
CA+ 6.4 Dr. Tana Coast notified.

## 2017-10-13 NOTE — Progress Notes (Signed)
PT Cancellation Note  Patient Details Name: Judy Herrera MRN: [id number] DOB: 02-Nov-1931   Cancelled Treatment:    Reason Eval/Treat Not Completed: Patient at procedure or test/unavailable. Pt going for D-Dimer. Will follow-up for PT treatment as time allows pending results.  Mabeline Caras, PT, DPT Acute Rehab Services  Pager: [place] 10/13/2017, 1:02 PM

## 2017-10-13 NOTE — Progress Notes (Addendum)
[facility]                                                                              Patient Demographics  Judy Herrera, is a 83 y.o. female, DOB - 06/16/1931, BPZ:[id number]  Admit date - 10/11/2017   Admitting Physician Waldemar Dickens, MD  Outpatient Primary MD for the patient is Marletta Lor, MD  Outpatient specialists:   LOS - 2  days   Medical records reviewed and are as summarized below:    Chief Complaint  Patient presents with  . Back Pain  . 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 Marin Olp, history of left breast CA stage III a, osteoporosis presented with right hip and back pain.  X-rays and overall exam was unremarkable admit [facility] on 11/23, she was ambulatory and discharged home.  However, on the morning of admission, she was unable to get out of bed without assistance with severe lower back pain Patient was also noted to be tachycardiac with a heart rate of 110s-120s, with possible A. fib, patient was admitted for further workup.   Assessment & Plan    Principal Problem: Acute lower back pain with sacral fracture (HCC), history of osteoporosis, right L5 vertebral fracture -CT of the L-spine showed L5 transverse fracture and sacral insufficiency fracture -Patient received fentanyl, hydrocodone and IV fluids without significant relief, subsequently admitted -PT OT consult, vitamin D, calcium replacement, pain control, bowel regimen with Senokot  Active Problems:   Hyperlipidemia -Continue simvastatin    Essential hypertension -BP continues to be low, discontinue lisinopril, HCTZ, hold Coreg, continue IV fluid hydration    MAT/ Episodes of asymptomatic SVT with underlying history of CAD, elevated troponin -EKG had shown atrial fib,  discussed in detail with cardiology, Dr. Sallyanne Kuster, does not think she has atrial fibrillation.  IV heparin drip was discontinued this morning -Hold Coreg, holding ACE inhibitor and HCTZ due to borderline BP -2D echo showed EF of 40-45% with diffuse hypokinesis, reduced right ventricular systolic function -Given continued hypotension and RV systolic function reduced, will rule out PE, VQ scan ordered Addendum 3:10pm VQ scan negative for PE  Anemia of chronic disease -Baseline hemoglobin 9.5, received Feraheme on 11/15 -H&H stable, follow closely  Acute renal insufficiency -Likely due to tachycardia and low perfusion, discontinue HCTZ, ACE inhibitor, Coreg  -Continue IV fluid hydration, rule out PE    BREAST CANCER, HX OF, endocrine pleural carcinoma of the skin, LUE -Breast CA, stage IIIa, follows Dr. Marin Olp, continue outpatient management -Received palliative radiation to the left arm, followed by Dr. Marin Olp  Code Status: full  DVT Prophylaxis:  Heparin  Family Communication: Discussed in detail with the patient, all imaging results, lab results explained to the patient   Disposition Plan: PT eval, may need SNF  Time Spent in minutes 35 minutes  Procedures:  2D echoThe estimated ejection fraction was   in the range of 40% to 45%. Diffuse hypokinesis.  Diastolic   dysfunction with elevated LV filling pressure.  Consultants:   Cardiology  Antimicrobials:      Medications  Scheduled Meds: . aspirin EC  81 mg Oral QHS  . heparin subcutaneous  5,000 Units Subcutaneous Q8H  . senna-docusate  1 tablet Oral QHS  . simvastatin  40 mg Oral QHS   Continuous Infusions: . sodium chloride 75 mL/hr at 10/12/17 1440   PRN Meds:.acetaminophen **OR** acetaminophen, bisacodyl, HYDROcodone-acetaminophen, HYDROmorphone (DILAUDID) injection, loperamide, ondansetron **OR** ondansetron (ZOFRAN) IV   Antibiotics   Anti-infectives (From admission, onward)   None         Subjective:   Judy Herrera was seen and examined today.  Pain controlled when not moving, no chest pain or shortness of breath.  BP continues to remain low.  No fevers. patient denies dizziness, abdominal pain, N/V/D/C, new weakness, numbess, tingling.   Objective:   Vitals:   10/12/17 1759 10/12/17 2221 10/13/17 0500 10/13/17 0819  BP: (!) 80/42 (!) 88/41 (!) 98/43 (!) 89/51  Pulse: 70 74 76 81  Resp:  18 16 17   Temp:  98.9 F (37.2 C) 98.6 F (37 C) 97.8 F (36.6 C)  TempSrc:  Oral Oral Oral  SpO2:  98% 99% 100%  Weight:      Height:        Intake/Output Summary (Last 24 hours) at 10/13/2017 1222 Last data filed at 10/13/2017 0600 Gross per 24 hour  Intake 1848.05 ml  Output 100 ml  Net 1748.05 ml     Wt Readings from Last 3 Encounters:  10/11/17 44.5 kg (98 lb)  10/08/17 44.5 kg (98 lb)  09/30/17 43.1 kg (95 lb)     Exam   General: Alert and oriented x 3, NAD  Eyes:   HEENT:    Cardiovascular: S1 S2 auscultated, Regular rate and rhythm. No pedal edema b/l  Respiratory: Clear to auscultation bilaterally, no wheezing, rales or rhonchi  Gastrointestinal: Soft, nontender, nondistended, + bowel sounds  Ext: no pedal edema bilaterally  Neuro: no new deficits  Musculoskeletal: No digital cyanosis, clubbing  Skin: No rashes  Psych: Normal affect and demeanor, alert and oriented x3    Data Reviewed:  I have personally reviewed following labs and imaging studies  Micro Results No results found for this or any previous visit (from the past 240 hour(s)).  Radiology Reports Dg Chest 1 View  Addendum Date: 10/11/2017   ADDENDUM REPORT: 10/11/2017 11:56 ADDENDUM: These results were called by telephone at the time of interpretation on 10/11/2017 at 11:56 am to Dr. Brantley Stage , who verbally acknowledged these results. Electronically Signed   By: Nolon Nations M.D.   On: 10/11/2017 11:56   Result Date: 10/11/2017 CLINICAL DATA:  New onset AFib.  Fell down yesterday. Lower back pain. EXAM: CHEST 1 VIEW COMPARISON:  11/28/2016 FINDINGS: Lungs are hyperinflated. Heart size is enlarged. There is increased left apical opacity, raising the question of pleural thickening or mass. Further evaluation with CT is recommended. Prominence of the right hilar region appears stable over time. Surgical clips overlie the left axilla. IMPRESSION: 1. Hyperinflation and stable cardiomegaly. 2. Question of mass the left lung apex. Further evaluation is CT of the chest is recommended. Intravenous contrast is recommended unless contraindicated. 3. These results will be called to the ordering clinician or representative by the Radiologist Assistant, and communication documented in the PACS or zVision Dashboard. Electronically Signed: By: Nolon Nations M.D. On: 10/11/2017 11:43   Dg Lumbar Spine  Complete  Result Date: 10/11/2017 CLINICAL DATA:  Fall yesterday.  Back pain EXAM: LUMBAR SPINE - COMPLETE 4+ VIEW COMPARISON:  CT abdomen pelvis 05/02/2013 FINDINGS: Normal alignment. Severe chronic compression fracture T12 unchanged from 2014. No acute fracture. Mild disc degeneration L2-3 and L3-4.  Negative for pars defect Advanced atherosclerotic calcification the thoracic and abdominal aorta with prior retroperitoneal surgery and multiple metal clips. Gallbladder clips. IMPRESSION: Severe chronic compression fracture T12.  No acute fracture. Electronically Signed   By: Franchot Gallo M.D.   On: 10/11/2017 11:43   Ct Lumbar Spine Wo Contrast  Result Date: 10/11/2017 CLINICAL DATA:  Severe low back pain after sitting down quickly in a chair. EXAM: CT LUMBAR SPINE WITHOUT CONTRAST TECHNIQUE: Multidetector CT imaging of the lumbar spine was performed without intravenous contrast administration. Multiplanar CT image reconstructions were also generated. COMPARISON:  Lumbar spine radiographs 10/11/2017. CT abdomen and pelvis 05/02/2013. FINDINGS: Segmentation: 5 lumbar type  vertebrae. Alignment: Mild upper lumbar levoscoliosis. Trace retrolisthesis of L2 on L3. Vertebrae: Chronic severe T12 vertebral fracture with diffuse vertebral body sclerosis and complete height loss centrally. T12 vertebral retropulsion measures 5 mm and results in mild spinal stenosis. There are acute mildly displaced fractures through the sacral ala bilaterally. There is also a nondisplaced right L5 transverse process fracture. No suspicious osseous lesion is identified. Paraspinal and other soft tissues: Aortoiliac atherosclerosis without aneurysm. Bilateral renal atrophy. Trace right and small left pleural effusions. Prior cholecystectomy. Disc levels: At L2-3, there is mild-to-moderate disc space narrowing and degenerative endplate sclerosis with circumferential disc bulging resulting in mild right neural foraminal stenosis and likely right lateral recess stenosis. Milder disc bulging is present at L3-4 and L4-5 resulting in mild neural foraminal narrowing but no significant spinal stenosis. IMPRESSION: 1. Acute sacral insufficiency type fractures. 2. Nondisplaced right L5 transverse process fracture. 3. Chronic T12 fracture with vertebra plana configuration, retropulsion, and mild spinal stenosis. 4.  Aortic Atherosclerosis (ICD10-I70.0). Electronically Signed   By: Logan Bores M.D.   On: 10/11/2017 14:05   Dg Hip Unilat  With Pelvis 2-3 Views Right  Result Date: 10/08/2017 CLINICAL DATA:  Right hip pain after fall. EXAM: DG HIP (WITH OR WITHOUT PELVIS) 2-3V RIGHT COMPARISON:  CT abdomen pelvis dated May 02, 2013. FINDINGS: No acute fracture or malalignment. Mild bilateral hip joint space narrowing. The pubic symphysis and sacroiliac joints are intact. Diffuse osteopenia. Multiple surgical clips in the pelvis. Atherosclerotic vascular calcifications. IMPRESSION: No acute osseous abnormality. If occult hip fracture is suspected or if the patient is unable to bear weight, MRI is the preferred modality  for further evaluation. Electronically Signed   By: Titus Dubin M.D.   On: 10/08/2017 09:24    Lab Data:  CBC: Recent Labs  Lab 10/11/17 0956 10/13/17 0636  WBC 7.4 4.7  HGB 9.5* 8.9*  HCT 29.4* 27.5*  MCV 87.0 89.3  PLT 182 867   Basic Metabolic Panel: Recent Labs  Lab 10/11/17 0956 10/12/17 0143 10/13/17 0636  NA 134* 132* 133*  K 3.9 4.1 4.2  CL 103 104 108  CO2 21* 17* 17*  GLUCOSE 142* 121* 106*  BUN 26* 39* 67*  CREATININE 1.09* 1.50* 2.40*  CALCIUM 8.1* 7.3* 6.4*  MG 2.0  --   --    GFR: Estimated Creatinine Clearance: 12.3 mL/min (A) (by C-G formula based on SCr of 2.4 mg/dL (H)). Liver Function Tests: Recent Labs  Lab 10/12/17 0143  AST 28  ALT 24  ALKPHOS 56  BILITOT  2.1*  PROT 5.5*  ALBUMIN 2.7*   No results for input(s): LIPASE, AMYLASE in the last 168 hours. No results for input(s): AMMONIA in the last 168 hours. Coagulation Profile: Recent Labs  Lab 10/12/17 0143  INR 1.39   Cardiac Enzymes: Recent Labs  Lab 10/11/17 1504 10/11/17 1857 10/11/17 2140 10/12/17 0143  TROPONINI 1.53* 1.66* 1.56* 1.45*   BNP (last 3 results) No results for input(s): PROBNP in the last 8760 hours. HbA1C: No results for input(s): HGBA1C in the last 72 hours. CBG: No results for input(s): GLUCAP in the last 168 hours. Lipid Profile: No results for input(s): CHOL, HDL, LDLCALC, TRIG, CHOLHDL, LDLDIRECT in the last 72 hours. Thyroid Function Tests: Recent Labs    10/11/17 1032  TSH 1.661   Anemia Panel: No results for input(s): VITAMINB12, FOLATE, FERRITIN, TIBC, IRON, RETICCTPCT in the last 72 hours. Urine analysis:    Component Value Date/Time   COLORURINE YELLOW 10/01/2015 1155   APPEARANCEUR CLEAR 10/01/2015 1155   LABSPEC 1.008 10/01/2015 1155   LABSPEC 1.010 05/07/2014 0835   PHURINE 7.5 10/01/2015 1155   GLUCOSEU NEGATIVE 10/01/2015 1155   GLUCOSEU Negative 05/07/2014 0835   HGBUR NEGATIVE 10/01/2015 1155   HGBUR 3+ 07/22/2011  1338   BILIRUBINUR moderate (A) 05/01/2017 1256   BILIRUBINUR negative 01/25/2017 1039   BILIRUBINUR Negative 05/07/2014 0835   KETONESUR trace (5) (A) 05/01/2017 [place] 10/01/2015 1155   PROTEINUR trace (A) 05/01/2017 1256   PROTEINUR negative 01/25/2017 1039   PROTEINUR NEGATIVE 10/01/2015 1155   UROBILINOGEN negative (A) 05/01/2017 1256   UROBILINOGEN 0.2 05/07/2014 0835   NITRITE Positive (A) 05/01/2017 1256   NITRITE negative 01/25/2017 1039   NITRITE NEGATIVE 10/01/2015 1155   LEUKOCYTESUR Large (3+) (A) 05/01/2017 1256   LEUKOCYTESUR Negative 05/07/2014 0835     Hasset Chaviano M.D. [facility] 10/13/2017, 12:22 PM  Pager: [phone] Between 7am to 7pm - call Pager - [phone]  After 7pm go to [email or web address] - password [email or web address]  Call night coverage person covering after 7pm

## 2017-10-13 NOTE — Evaluation (Signed)
Occupational Therapy Evaluation Patient Details Name: Judy Herrera MRN: [id number] DOB: Oct 22, 1931 Today's Date: 10/13/2017    History of Present Illness Pt is a 83 y.o. 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 CXR incidentally noted question of mass in left lung apex   Clinical Impression   Pt admitted with above. She demonstrates the below listed deficits and will benefit from continued OT to maximize safety and independence with BADLs.  Pt limited by pain, but extremely motivated -should make good progress.  She was fully independent PTA, and lived alone.  Anticipate she will need short term SNF rehab at discharge.        Follow Up Recommendations  SNF;Supervision/Assistance - 24 hour    Equipment Recommendations  3 in 1 bedside commode    Recommendations for Other Services       Precautions / Restrictions Precautions Precautions: Fall Precaution Comments: left UE restrictions Restrictions Weight Bearing Restrictions: No      Mobility Bed Mobility Overal bed mobility: Needs Assistance Bed Mobility: Supine to Sit     Supine to sit: Mod assist;+2 for physical assistance     General bed mobility comments: ModA+2 with helicopter technique to sit EOB secondary to increased pain with BLE movement. Once in recliner, pt able to scoot back using BUEs with minA+2  Transfers Overall transfer level: Needs assistance Equipment used: 2 person hand held assist Transfers: Sit to/from Stand Sit to Stand: Mod assist;+2 physical assistance         General transfer comment: ModA+2 with side by side transfer and bilat HHA, stood x1 with  pt having to return to sit secondary to pain. Stood again to amb to chair    Balance Overall balance assessment: Needs assistance   Sitting balance-Leahy Scale: Fair       Standing balance-Leahy Scale: Poor Standing balance comment: Mod-maxA with UE support                           ADL either performed or assessed with clinical judgement   ADL Overall ADL's : Needs assistance/impaired Eating/Feeding: Independent   Grooming: Wash/dry hands;Wash/dry face;Oral care;Brushing hair;Set up;Sitting   Upper Body Bathing: Supervision/ safety;Set up;Sitting   Lower Body Bathing: Sit to/from stand;Maximal assistance   Upper Body Dressing : Minimal assistance;Sitting   Lower Body Dressing: Total assistance;Sit to/from stand   Toilet Transfer: Moderate assistance;+2 for physical assistance;Stand-pivot;BSC   Toileting- Clothing Manipulation and Hygiene: Maximal assistance;Sit to/from stand       Functional mobility during ADLs: Moderate assistance;+2 for physical assistance General ADL Comments: limited by pain      Vision         Perception     Praxis      Pertinent Vitals/Pain Pain Assessment: Faces Faces Pain Scale: Hurts even more Pain Location: Lower back Pain Descriptors / Indicators: Grimacing;Aching Pain Intervention(s): Limited activity within patient's tolerance;Monitored during session;Repositioned     Hand Dominance Right   Extremity/Trunk Assessment Upper Extremity Assessment Upper Extremity Assessment: Generalized weakness   Lower Extremity Assessment Lower Extremity Assessment: Defer to PT evaluation   Cervical / Trunk Assessment Cervical / Trunk  Assessment: Kyphotic   Communication Communication Communication: No difficulties   Cognition Arousal/Alertness: Awake/alert Behavior During Therapy: Anxious Overall Cognitive Status: Within Functional Limits for tasks assessed                                     General  Comments  SpO2 >94% on RA    Exercises     Shoulder Instructions      Home Living Family/patient expects to be discharged to:: Private residence Living Arrangements: Alone Available Help at Discharge: Family;Available PRN/intermittently Type of Home: House Home Access: Stairs to enter [employer] of Steps: 2 Entrance Stairs-Rails: Can reach both Home Layout: One level     Bathroom Shower/Tub: Tub only(sponge bathes )   Bathroom Toilet: Standard     Home Equipment: Walker - 2 wheels          Prior Functioning/Environment Level of Independence: Independent        Comments: patient walks and goes to the gym, fiercely independent as described by family        OT Problem List: Decreased strength;Decreased activity tolerance;Impaired balance (sitting and/or standing);Decreased knowledge of use of DME or AE;Pain      OT Treatment/Interventions: Self-care/ADL training;Therapeutic exercise;DME and/or AE instruction;Therapeutic activities;Patient/family education;Balance training    OT Goals(Current goals can be found in the care plan section) Acute Rehab OT Goals Patient Stated Goal: to go home OT Goal Formulation: With patient Time For Goal Achievement: 10/27/17 Potential to Achieve Goals: Good ADL Goals Pt Will Perform Grooming: with min assist;standing Pt Will Perform Lower Body Bathing: with min assist;with adaptive equipment;sit to/from stand Pt Will Perform Lower Body Dressing: with min assist;sit to/from stand;with adaptive equipment Pt Will Transfer to Toilet: with min assist;ambulating;regular height toilet;bedside commode;grab bars Pt Will Perform Toileting - Clothing Manipulation and hygiene: with min assist;sit to/from stand  OT Frequency: Min 2X/week   Barriers to D/C: Decreased caregiver support          Co-evaluation PT/OT/SLP Co-Evaluation/Treatment: Yes Reason for Co-Treatment: For patient/therapist safety;To address functional/ADL  transfers PT goals addressed during session: Mobility/safety with mobility;Balance OT goals addressed during session: ADL's and self-care      AM-PAC PT "6 Clicks" Daily Activity     Outcome Measure Help from another person eating meals?: None Help from another person taking care of personal grooming?: A Little Help from another person toileting, which includes using toliet, bedpan, or urinal?: A Lot Help from another person bathing (including washing, rinsing, drying)?: A Lot Help from another person to put on and taking off regular upper body clothing?: A Little Help from another person to put on and taking off regular lower body clothing?: Total 6 Click Score: 15   End of Session Equipment Utilized During Treatment: Gait belt;Rolling walker Nurse Communication: Mobility status  Activity Tolerance: Patient limited by pain Patient left: in chair;with call bell/phone within reach  OT Visit Diagnosis: Unsteadiness on feet (R26.81);Pain Pain - part of body: (back)                Time: 1287-8676 OT Time Calculation (min): 18 min Charges:  OT General Charges $OT Visit: 1 Visit OT Evaluation $OT Eval Moderate Complexity: 1 Mod G-Codes:     [facility], OTR/L [phone]   Lucille Passy M 10/13/2017, 4:35 PM

## 2017-10-14 ENCOUNTER — Inpatient Hospital Stay (HOSPITAL_COMMUNITY): Payer: Medicare HMO

## 2017-10-14 DIAGNOSIS — M810 Age-related osteoporosis without current pathological fracture: Secondary | ICD-10-CM

## 2017-10-14 LAB — CBC
HCT: 27 % — ABNORMAL LOW (ref 36.0–46.0)
HEMOGLOBIN: 8.8 g/dL — AB (ref 12.0–15.0)
MCH: 28.6 pg (ref 26.0–34.0)
MCHC: 32.6 g/dL (ref 30.0–36.0)
MCV: 87.7 fL (ref 78.0–100.0)
Platelets: 189 10*3/uL (ref 150–400)
RBC: 3.08 MIL/uL — AB (ref 3.87–5.11)
RDW: 18.9 % — ABNORMAL HIGH (ref 11.5–15.5)
WBC: 2.9 10*3/uL — ABNORMAL LOW (ref 4.0–10.5)

## 2017-10-14 LAB — BASIC METABOLIC PANEL
Anion gap: 8 (ref 5–15)
BUN: 83 mg/dL — AB (ref 6–20)
CHLORIDE: 110 mmol/L (ref 101–111)
CO2: 15 mmol/L — ABNORMAL LOW (ref 22–32)
Calcium: 6.9 mg/dL — ABNORMAL LOW (ref 8.9–10.3)
Creatinine, Ser: 2.02 mg/dL — ABNORMAL HIGH (ref 0.44–1.00)
GFR calc Af Amer: 25 mL/min — ABNORMAL LOW (ref >60)
GFR calc non Af Amer: 21 mL/min — ABNORMAL LOW (ref >60)
Glucose, Bld: 99 mg/dL (ref 65–99)
POTASSIUM: 4.8 mmol/L (ref 3.5–5.1)
SODIUM: 133 mmol/L — AB (ref 135–145)

## 2017-10-14 MED ORDER — CARVEDILOL 6.25 MG PO TABS
6.2500 mg | ORAL_TABLET | Freq: Two times a day (BID) | ORAL | Status: DC
  Administered 2017-10-14 – 2017-10-21 (×10): 6.25 mg via ORAL
  Filled 2017-10-14 (×4): qty 1
  Filled 2017-10-14: qty 2
  Filled 2017-10-14 (×3): qty 1
  Filled 2017-10-14: qty 2
  Filled 2017-10-14 (×2): qty 1

## 2017-10-14 MED ORDER — ENSURE ENLIVE PO LIQD
237.0000 mL | Freq: Two times a day (BID) | ORAL | Status: DC
  Administered 2017-10-14 – 2017-10-21 (×10): 237 mL via ORAL

## 2017-10-14 NOTE — Progress Notes (Signed)
PROGRESS NOTE    Kiwana Deblasi  GGY:[id number] DOB: November 14, 1931 DOA: 10/11/2017 PCP: Marletta Lor, MD   Chief Complaint  Patient presents with  . Back Pain  . Tachycardia    Brief Narrative:  HPI on 10/11/2017 by Dr. Sharene Butters, PA Larken Urias is a 83 y.o. 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  X-rays and overall exam was unremarkable, she was ambulatory and discharged home.  However, this morning she woke up unable to get out of bed without assistance.  She had, much more severe lower back pain.  She denies any numbness or tingling, she denies any constipation or difficulty with urination or incontinence.  The patient denies any saddle anesthesia.  She denies any rest pain or palpitations. NO changes in meds or herbal products  She denies any shortness of breath cough.  Of note, in route, EMS noted that the patient was very tachycardic, with heart rate between 110-120s.  The EMS strips suggested possible atrial fibrillation, but she was essentially asymptomatic.  She denies any syncope or presyncope, chest pain or palpitations. Denies shortness of breath  This is new to her, denies any history of atrial fibrillation.  Assessment & Plan   Acute lower back pain with sacral fracture/history of osteoporosis, right L5 vertebral fracture -CT of the lumbar spine showed L5 transverse fracture and sacral insufficiency fracture -Patient received fentanyl, hydrocodone, IV fluids without  synechia relief and was admitted -PT, OT consulted recommended SNF -Continue vitamin D and calcium supplementation -Continue pain control with bowel regimen  MAT/NSVT/elevated troponin -EKG showed atrial fibrillation -Currently asymptomatic -Cardiology consulted and appreciated -Patient was on IV heparin however this was discontinued -Coreg was initially held however is being restarted at a lower dose today by cardiology -ACE inhibitor and HCTZ held due to soft BP -Echocardiogram showed EF of 40-45% with diffuse hypokinesis, reduced right ventricular systolic function -VQ scan negative for PE  Anemia of chronic disease -Patient was given. Hemoglobin 09/30/2017 -Hemoglobin appears to be stable, continue to monitor CBC  Acute kidney injury -Likely due to tachycardia with low perfusion -HCTZ and ACE inhibitor held -Continue IV fluid hydration and monitor BMP -Creatinine currently 2.02  Breast cancer/endocrine pleural carcinoma skin -Patient follows with Dr. Marin Olp for Stage IIIa breast cancer -Received palliative radiation of LUE  DVT Prophylaxis  heparin  Code Status: Full  Family Communication: None at bedside  Disposition Plan: admitted, will need SNF  Consultants Cardiology  Procedures  Echocardiogram  Antibiotics   Anti-infectives (From admission, onward)   None      Subjective:   [name] seen and examined today.  Has no complaints today. Denies chest pain, shortness of breath, abdominal pain, nausea, vomiting, diarrhea, constipation, dizziness, headache.   Objective:   Vitals:   10/13/17 1656 10/13/17 2239 10/14/17 0434 10/14/17 0808  BP: (!) 93/50 (!) 101/54 (!) 98/52 (!) 120/56  Pulse: 86 87 81 86  Resp: 18 16 15 16   Temp: 98 F (36.7 C) 97.8 F (36.6  C) 98.2 F (36.8 C) 98.2 F (36.8 C)  TempSrc: Oral   Oral  SpO2: 97% 100% 99% 100%  Weight:  45.4 kg (100 lb)    Height:        Intake/Output Summary (Last 24 hours) at 10/14/2017  1455 Last data filed at 10/14/2017 0600 Gross per 24 hour  Intake 500 ml  Output 0 ml  Net 500 ml   Filed Weights   10/11/17 0938 10/13/17 2239  Weight: 44.5 kg (98 lb) 45.4 kg (100 lb)    Exam  General: Well developed, well nourished, NAD, appears stated age  19: NCAT, mucous membranes moist.   Cardiovascular: S1 S2 auscultated, RRR, no murmurs  Respiratory: Clear to auscultation bilaterally with equal chest rise  Abdomen: Soft, nontender, nondistended, + bowel sounds  Extremities: warm dry without cyanosis clubbing or edema  Neuro: AAOx3, nonfocal  Psych: Normal affect and demeanor    Data Reviewed: I have personally reviewed following labs and imaging studies  CBC: Recent Labs  Lab 10/11/17 0956 10/13/17 0636 10/14/17 0732  WBC 7.4 4.7 2.9*  HGB 9.5* 8.9* 8.8*  HCT 29.4* 27.5* 27.0*  MCV 87.0 89.3 87.7  PLT 182 152 852   Basic Metabolic Panel: Recent Labs  Lab 10/11/17 0956 10/12/17 0143 10/13/17 0636 10/14/17 0732  NA 134* 132* 133* 133*  K 3.9 4.1 4.2 4.8  CL 103 104 108 110  CO2 21* 17* 17* 15*  GLUCOSE 142* 121* 106* 99  BUN 26* 39* 67* 83*  CREATININE 1.09* 1.50* 2.40* 2.02*  CALCIUM 8.1* 7.3* 6.4* 6.9*  MG 2.0  --   --   --    GFR: Estimated Creatinine Clearance: 14.9 mL/min (A) (by C-G formula based on SCr of 2.02 mg/dL (H)). Liver Function Tests: Recent Labs  Lab 10/12/17 0143  AST 28  ALT 24  ALKPHOS 56  BILITOT 2.1*  PROT 5.5*  ALBUMIN 2.7*   No results for input(s): LIPASE, AMYLASE in the last 168 hours. No results for input(s): AMMONIA in the last 168 hours. Coagulation Profile: Recent Labs  Lab 10/12/17 0143  INR 1.39   Cardiac Enzymes: Recent Labs  Lab 10/11/17 1504 10/11/17 1857 10/11/17 2140 10/12/17 0143  TROPONINI 1.53* 1.66* 1.56* 1.45*   BNP (last 3 results) No results for input(s): PROBNP in the last 8760 hours. HbA1C: No results for input(s): HGBA1C in the last 72 hours. CBG: No results for  input(s): GLUCAP in the last 168 hours. Lipid Profile: No results for input(s): CHOL, HDL, LDLCALC, TRIG, CHOLHDL, LDLDIRECT in the last 72 hours. Thyroid Function Tests: No results for input(s): TSH, T4TOTAL, FREET4, T3FREE, THYROIDAB in the last 72 hours. Anemia Panel: No results for input(s): VITAMINB12, FOLATE, FERRITIN, TIBC, IRON, RETICCTPCT in the last 72 hours. Urine analysis:    Component Value Date/Time   COLORURINE YELLOW 10/01/2015 [place] 10/01/2015 1155   LABSPEC 1.008 10/01/2015 1155   LABSPEC 1.010 05/07/2014 0835   PHURINE 7.5 10/01/2015 1155   GLUCOSEU NEGATIVE 10/01/2015 1155   GLUCOSEU Negative 05/07/2014 0835   HGBUR NEGATIVE 10/01/2015 1155   HGBUR 3+ 07/22/2011 1338   BILIRUBINUR moderate (A) 05/01/2017 1256   BILIRUBINUR negative 01/25/2017 1039   BILIRUBINUR Negative 05/07/2014 0835   KETONESUR trace (5) (A) 05/01/2017 1256   [place] 10/01/2015 1155   PROTEINUR trace (A) 05/01/2017 1256   PROTEINUR negative 01/25/2017 [place] 10/01/2015 1155   UROBILINOGEN negative (A) 05/01/2017 1256   UROBILINOGEN  0.2 05/07/2014 0835   NITRITE Positive (A) 05/01/2017 1256   NITRITE negative 01/25/2017 1039   NITRITE NEGATIVE 10/01/2015 1155   LEUKOCYTESUR Large (3+) (A) 05/01/2017 1256   LEUKOCYTESUR Negative 05/07/2014 0835   Sepsis Labs: @LABRCNTIP (procalcitonin:4,lacticidven:4)  )No results found for this or any previous visit (from the past 240 hour(s)).    Radiology Studies: Dg Chest 2 View  Result Date: 10/13/2017 CLINICAL DATA:  Shortness of breath.  History of breast cancer. EXAM: CHEST  2 VIEW COMPARISON:  Single-view of the chest 10/11/2017. PA and lateral chest 11/28/2016. CT chest 10/01/2015. FINDINGS: The patient has a small to moderate left pleural effusion with basilar airspace disease, new since the prior exam. The right lung is clear. Heart size is upper normal. No pneumothorax. Aortic  atherosclerosis is noted. The patient is status post left mastectomy and axillary dissection. Left shoulder replacement is identified. IMPRESSION: Small to moderate left pleural effusion with associated basilar airspace disease which could be atelectasis or infection, new since the most recent exam. Atherosclerosis. Electronically Signed   By: Inge Rise M.D.   On: 10/13/2017 14:16   Nm Pulmonary Perf And Vent  Result Date: 10/13/2017 CLINICAL DATA:  Evaluate for acute pulmonary embolus. High pretest probability EXAM: NUCLEAR MEDICINE VENTILATION - PERFUSION LUNG SCAN TECHNIQUE: Ventilation images were obtained in multiple projections using inhaled aerosol Tc-47mDTPA. Perfusion images were obtained in multiple projections after intravenous injection of Tc-93mAA. RADIOPHARMACEUTICALS:  32.0 mCi Technetium-9947mPA aerosol inhalation and 4.2 mCi Technetium-25m62m IV COMPARISON:  Chest radiograph from 10/13/2017 FINDINGS: .: FINDINGS: . Ventilation: There is diffuse heterogeneous distribution of the radiopharmaceutical throughout both lungs. Perfusion: Mildly heterogeneous distribution of the radiopharmaceutical to both lungs. No unmatched wedge shaped peripheral perfusion defects to suggest acute pulmonary embolism. IMPRESSION: No unmatched segmental perfusion defects identified to suggest acute pulmonary embolus. Heterogeneous ventilation and perfusion to both lungs compatible with obstructive pulmonary disease. Electronically Signed   By: TaylKerby Moors.   On: 10/13/2017 13:55     Scheduled Meds: . aspirin EC  81 mg Oral QHS  . carvedilol  6.25 mg Oral BID WC  . feeding supplement (ENSURE ENLIVE)  237 mL Oral BID BM  . heparin subcutaneous  5,000 Units Subcutaneous Q8H  . senna-docusate  1 tablet Oral QHS  . simvastatin  40 mg Oral QHS   Continuous Infusions: . sodium chloride 75 mL/hr at 10/14/17 1151     LOS: 3 days   Time Spent in minutes   30 minutes  Marsh Heckler D.O.  on 10/14/2017 at 2:55 PM  Between 7am to 7pm - Pager - [phone] 7pm go to [email or web address] - password [email or web address]  And look for the night coverage person covering for me after hours  [facility] Office  [phone]

## 2017-10-14 NOTE — Progress Notes (Signed)
Progress Note  Patient Name: Trevor Duty Date of Encounter: 10/14/2017  Primary Cardiologist: Dr. Stanford Breed  Subjective   Overall feeling better. No CP, SOB, palpitations. Back pain is better but still noticeable with any movement out of bed.  Inpatient Medications    Scheduled Meds: . aspirin EC  81 mg Oral QHS  . heparin subcutaneous  5,000 Units Subcutaneous Q8H  . senna-docusate  1 tablet Oral QHS  . simvastatin  40 mg Oral QHS   Continuous Infusions: . sodium chloride 75 mL/hr at 10/12/17 1440   PRN Meds: acetaminophen **OR** acetaminophen, bisacodyl, HYDROcodone-acetaminophen, HYDROmorphone (DILAUDID) injection, loperamide, ondansetron **OR** ondansetron (ZOFRAN) IV   Vital Signs    Vitals:   10/13/17 1656 10/13/17 2239 10/14/17 0434 10/14/17 0808  BP: (!) 93/50 (!) 101/54 (!) 98/52 (!) 120/56  Pulse: 86 87 81 86  Resp: 18 16 15 16   Temp: 98 F (36.7 C) 97.8 F (36.6 C) 98.2 F (36.8 C) 98.2 F (36.8 C)  TempSrc: Oral   Oral  SpO2: 97% 100% 99% 100%  Weight:  100 lb (45.4 kg)    Height:        Intake/Output Summary (Last 24 hours) at 10/14/2017 1109 Last data filed at 10/14/2017 0600 Gross per 24 hour  Intake 500 ml  Output 0 ml  Net 500 ml   Filed Weights   10/11/17 0938 10/13/17 2239  Weight: 98 lb (44.5 kg) 100 lb (45.4 kg)    Telemetry    NSR with 2  brief runs atrial tach - Personally Reviewed  Physical Exam   GEN: No acute distress, frail elderly WF sitting up eating breakfast HEENT: Normocephalic, atraumatic, sclera non-icteric. Neck: No JVD or bruits. Cardiac: RRR no murmurs, rubs, or gallops.  Radials/DP/PT 1+ and equal bilaterally.  Respiratory: Clear to auscultation bilaterally. Breathing is unlabored. GI: Soft, nontender, non-distended, BS +x 4. MS: no deformity. Extremities: No clubbing or cyanosis. No LE edema. + LUE edema. Distal pedal pulses are 2+ and equal bilaterally. Neuro:  AAOx3. Follows commands. Psych:   Responds to questions appropriately with a normal affect.  Labs    Chemistry Recent Labs  Lab 10/12/17 0143 10/13/17 0636 10/14/17 0732  NA 132* 133* 133*  K 4.1 4.2 4.8  CL 104 108 110  CO2 17* 17* 15*  GLUCOSE 121* 106* 99  BUN 39* 67* 83*  CREATININE 1.50* 2.40* 2.02*  CALCIUM 7.3* 6.4* 6.9*  PROT 5.5*  --   --   ALBUMIN 2.7*  --   --   AST 28  --   --   ALT 24  --   --   ALKPHOS 56  --   --   BILITOT 2.1*  --   --   GFRNONAA 31* 17* 21*  GFRAA 36* 20* 25*  ANIONGAP 11 8 8      Hematology Recent Labs  Lab 10/11/17 0956 10/13/17 0636 10/14/17 0732  WBC 7.4 4.7 2.9*  RBC 3.38* 3.08* 3.08*  HGB 9.5* 8.9* 8.8*  HCT 29.4* 27.5* 27.0*  MCV 87.0 89.3 87.7  MCH 28.1 28.9 28.6  MCHC 32.3 32.4 32.6  RDW 18.3* 19.4* 18.9*  PLT 182 152 189    Cardiac Enzymes Recent Labs  Lab 10/11/17 1504 10/11/17 1857 10/11/17 2140 10/12/17 0143  TROPONINI 1.53* 1.66* 1.56* 1.45*   No results for input(s): TROPIPOC in the last 168 hours.   BNPNo results for input(s): BNP, PROBNP in the last 168 hours.   DDimer  Recent Labs  Lab 10/13/17 1102  DDIMER 3.50*     Radiology    Dg Chest 2 View  Result Date: 10/13/2017 CLINICAL DATA:  Shortness of breath.  History of breast cancer. EXAM: CHEST  2 VIEW COMPARISON:  Single-view of the chest 10/11/2017. PA and lateral chest 11/28/2016. CT chest 10/01/2015. FINDINGS: The patient has a small to moderate left pleural effusion with basilar airspace disease, new since the prior exam. The right lung is clear. Heart size is upper normal. No pneumothorax. Aortic atherosclerosis is noted. The patient is status post left mastectomy and axillary dissection. Left shoulder replacement is identified. IMPRESSION: Small to moderate left pleural effusion with associated basilar airspace disease which could be atelectasis or infection, new since the most recent exam. Atherosclerosis. Electronically Signed   By: Inge Rise M.D.   On: 10/13/2017  14:16   Nm Pulmonary Perf And Vent  Result Date: 10/13/2017 CLINICAL DATA:  Evaluate for acute pulmonary embolus. High pretest probability EXAM: NUCLEAR MEDICINE VENTILATION - PERFUSION LUNG SCAN TECHNIQUE: Ventilation images were obtained in multiple projections using inhaled aerosol Tc-11mDTPA. Perfusion images were obtained in multiple projections after intravenous injection of Tc-964mAA. RADIOPHARMACEUTICALS:  32.0 mCi Technetium-9980mPA aerosol inhalation and 4.2 mCi Technetium-32m96m IV COMPARISON:  Chest radiograph from 10/13/2017 FINDINGS: .: FINDINGS: . Ventilation: There is diffuse heterogeneous distribution of the radiopharmaceutical throughout both lungs. Perfusion: Mildly heterogeneous distribution of the radiopharmaceutical to both lungs. No unmatched wedge shaped peripheral perfusion defects to suggest acute pulmonary embolism. IMPRESSION: No unmatched segmental perfusion defects identified to suggest acute pulmonary embolus. Heterogeneous ventilation and perfusion to both lungs compatible with obstructive pulmonary disease. Electronically Signed   By: TaylKerby Moors.   On: 10/13/2017 13:55    Cardiac Studies   2d echo 10/12/17 Study Conclusions  - Left ventricle: The cavity size was normal. Wall thickness was   increased in a pattern of moderate LVH. Systolic function was   mildly to moderately reduced. The estimated ejection fraction was   in the range of 40% to 45%. Diffuse hypokinesis. Diastolic   dysfunction with elevated LV filling pressure. - Mitral valve: Moderate to severe MAC. Leaflet thickening.   Moderate regurgitation. - Left atrium: The atrium was mildly dilated. - Right ventricle: The cavity size was mildly dilated. Moderately   reduced systolic function. - Right atrium: The atrium was normal in size. - Tricuspid valve: There was mild regurgitation. - Pulmonary arteries: PA peak pressure: 34 mm Hg (S). - Inferior vena cava: The vessel was dilated. The  respirophasic   diameter changes were blunted (< 50%), consistent with elevated   central venous pressure.  Impressions:  - Compared to a prior study in 2016, the LVEF is lower at 40-45%.  Patient Profile     86 y73. 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 CXR incidentally noted question of mass in left lung apex. Cardiology consulted for tachycardia and elevated troponin.  Assessment & Plan    1. Back pain with acute fractures - concerning in setting of malignancy - occurred after she sat down "hard" on a recliner. Also with possible new lung mass in face of prior malignancies. Further per  IM.  2. Elevated troponin - flat trend albeit higher than one would expect for simple demand ischemia. Could be due to tachycardia but cannot exclude underlying progressive CAD. She was treated with heparin. She is on aspirin, beta blocker, and statin. Agree it is hard to understand the significance of this troponin in the setting of her presentation. In the absence of angina, her overall frailty, and numerous comorbidities she is not felt to be an ideal candidate for invasive evaluation. PE was ruled out by VQ. Would continue aspirin and statin. Consider changing simvastatin to atorvastatin. Resume carvedilol as below.   3. Tachycardia, with PSVT - Dr. Sallyanne Kuster did not feel rhythm represented atrial fib/flutter. She is in NSR with rare episodes of what appear to be atrial tach. She has been asymptomatic with this. Carvedilol 22m BID was held due to hypotension. Per review with MD, will resume at lower dose (6.266mBID).  4. AKI - ? Related to hypotension. Per IM.  Improving. Holding ARB/diuretic.  5. Recurrent cardiomyopathy EF 40-50% with moderate MR, moderately reduced RV Function and elevated CVP - filling pressure was elevated by echo. May need to consider diuretic resumption at DC contingent on BP stability and creatinine.  For questions or updates, please contact [facility] consult [email or web address] for contact info under Cardiology/STEMI.  Signed, DaCharlie PitterPA-C 10/14/2017, 11:09 AM    I have seen and examined the patient along with DaCharlie PitterPA-C.  I have reviewed the chart, notes and new data.  I agree with PA's note.  Key new complaints: no CV complaints, has back pain Key examination changes: BP is improving, a few bursts of SVT Key new findings / data: slight improvement in renal function.  PLAN: Restart beta blocker at a lower dose to avoid rebound arrhythmia/tachycardia. Will plan to increase to previous dose as an OP. Would delay restarting ACEi until further improvement in kidney function. Clinically without overt volume overload today. Would try to see how she does without diuretic, but ask rehab facility to call [facility] she gains >3 lb from DC weight.  MiSanda KleinMD, [phone]/29/2018, 11:36 AM

## 2017-10-14 NOTE — Clinical Social Work Placement (Signed)
   CLINICAL SOCIAL WORK PLACEMENT  NOTE  Date:  10/14/2017  Patient Details  Name: Judy Herrera MRN: [id number] Date of Birth: July 07, 1931  Clinical Social Work is seeking post-discharge placement for this patient at the [place] level of care (*CSW will initial, date and re-position this form in  chart as items are completed):  Yes   Patient/family provided with [place] Work Department's list of facilities offering this level of care within the geographic area requested by the patient (or if unable, by the patient's family).  Yes   Patient/family informed of their freedom to choose among providers that offer the needed level of care, that participate in Medicare, Medicaid or managed care program needed by the patient, have an available bed and are willing to accept the patient.  Yes   Patient/family informed of [facility]'s ownership interest in [facility] and [facility], as well as of the fact that they are under no obligation to receive care at these facilities.  PASRR submitted to EDS on 10/14/17     PASRR number received on 10/14/17     Existing PASRR number confirmed on       FL2 transmitted to all facilities in geographic area requested by pt/family on 10/14/17     FL2 transmitted to all facilities within larger geographic area on       Patient informed that his/her managed care company has contracts with or will negotiate with certain facilities, including the following:        Yes   Patient/family informed of bed offers received.  Patient [facility] bed at [facility]     Physician recommends and patient chooses bed at      Patient to be transferred to [facility] on  .  Patient to be transferred to facility by  ambulance     Patient family notified on   of transfer.  Name of family member notified:        PHYSICIAN      Additional Comment:  10/14/17  - Authorization initiated by admissions director at [facility].   _______________________________________________ Sable Feil, LCSW 10/14/2017, 6:06 PM

## 2017-10-14 NOTE — Progress Notes (Signed)
F/u scheduled 12/11, appt placed in AVS. Yamaira Spinner PA-C

## 2017-10-14 NOTE — Progress Notes (Signed)
Initial Nutrition Assessment  DOCUMENTATION CODES:   Underweight  INTERVENTION:   -Ensure Enlive po BID, each supplement provides 350 kcal and 20 grams of protein   NUTRITION DIAGNOSIS:   Inadequate oral intake related to poor appetite as evidenced by per patient/family report, meal completion < 25%.  GOAL:   Patient will meet greater than or equal to 90% of their needs  MONITOR:   PO intake, Supplement acceptance, Labs, Weight trends  REASON FOR ASSESSMENT:   (Low BMI)    ASSESSMENT:   83 yo female admitted with acute lower back pain with sacral fracture, right L5 vertebral fracture with hx of osteoporosis. Pt with hx of HTN, HLD, CAD, breast cancer, endocrine pleural carcinoma skin  Pt appetite poor at present, ate bites of donut at breakfast this AM. Recorded po intake 25% on average  Pt reports she typically eats 3 meals per day. Breakfast is typicailly cereal with coffee and juice, lunch is pimento cheese sandwich or hamburger or hot dog and dinner is something like hamburger steak with baked potato and salad. Pt also sometimes drinks Boost at home  Pt reports UBW around 98 pounds, current wt of 100 pounds.  Pt at risk for malnutrition. Noted some depletions on exam but older age and limited mobility may be contributing. Weight has been stable recently and po intake per report has been good at home. Will continue to assess nutritional status  Labs: sodium 133, Creatinine 2.02 Meds: NS at 75 ml/hr  NUTRITION - FOCUSED PHYSICAL EXAM:    Most Recent Value  Orbital Region  Moderate depletion  Upper Arm Region  No depletion  Thoracic and Lumbar Region  Mild depletion  Buccal Region  Mild depletion  Temple Region  Moderate depletion  Clavicle Bone Region  Moderate depletion  Clavicle and Acromion Bone Region  Mild depletion  Scapular Bone Region  Moderate depletion  Dorsal Hand  No depletion  Patellar Region  Mild depletion  Anterior Thigh Region  Mild depletion   Posterior Calf Region  Mild depletion  Edema (RD Assessment)  Severe [UE only]  Hair  Reviewed  Eyes  Reviewed  Mouth  Reviewed  Skin  Reviewed  Nails  Reviewed       Diet Order:  Diet regular Room service appropriate? Yes; Fluid consistency: Thin  EDUCATION NEEDS:   Not appropriate for education at this time  Skin:  Skin Assessment: Reviewed RN Assessment  Last BM:  11/27  Height:   Ht Readings from Last 1 Encounters:  10/11/17 5\' 3"  (1.6 m)    Weight:   Wt Readings from Last 1 Encounters:  10/13/17 100 lb (45.4 kg)    Ideal Body Weight:     BMI:  Body mass index is 17.71 kg/m.  Estimated Nutritional Needs:   Kcal:  1350-1550 kcals  Protein:  65-75 g  Fluid:  >/ = 1.4 L   Kerman Passey MS, RD, LDN, CNSC [phone] Pager  [phone] Weekend/On-Call Pager

## 2017-10-14 NOTE — Clinical Social Work Note (Signed)
Clinical Social Work Assessment  Patient Details  Name: Judy Herrera MRN: [id number] Date of Birth: 04-30-1931  Date of referral:  10/14/17               Reason for consult:  Facility Placement                Permission sought to share information with:  Family Supports Permission granted to share information::  No(Patient did not give verbal permission, but allowed her son to talk with CSW as patient is [facility].)  Name::     Judy Herrera   Agency::     Relationship::  Son  Contact Information:  [phone]  Housing/Transportation Living arrangements for the past 2 months:  Single Family Home Source of Information:  Adult Children(Son [name]) Patient Interpreter Needed:  None Criminal Activity/Legal Involvement Pertinent to Current Situation/Hospitalization:  No - Comment as needed Significant Relationships:  Adult Children Lives with:  Self Do you feel safe going back to the place where you live?  No(Son in agreement with ST rehab as patient lives alone) Need for family participation in patient care:  Yes (Comment)  Care giving concerns:  Son, [name] indicated that his mother lives alone and he lives next door. Mr. Rudge expressed agreement with ST rehab and patient also nodded agreement.     Social Worker assessment / plan: CSW visited with patient  and son at the bedside. [name] was lying in bed and was awake and alert. She responded to CSW's greeting and answered questions when asked, but allowed her son to take the lead in the conversation.  Mr. Muhs reported that he lives next door and assists his mother with food preparation, transport, washing clothes and anything else needed. Son informed CSW that his mother ambulated and did things around the house with no problem before coming to the hospital.  Mr. Bresee added with  pride that his mother was going to the gym with him 3 times a week.   Patient and son in agreement with ST rehab and when asked, son responded that  his mother has never been to rehab before. The skilled facility search process was explained and Mr. Ponce was provided with a SNF list for [facility].  Employment status:  Retired Medical sales representative) PT Recommendations:  [place] / Referral to community resources:  [facility](Son provided with SNF list for [facility])  Patient/Family's Response to care:  Mr. Choo did not express any concerns regarding his mother's care during hospitalization.   Patient/Family's Understanding of and Emotional Response to Diagnosis, Current Treatment, and Prognosis:  Son presented as knowledgeable regarding his mother health issues and reason for hospitalization. He is very agreeable to ST rehab for his mother before she returns home.   Emotional Assessment Appearance:  Appears stated age Attitude/Demeanor/Rapport:  Other(Appropriate) Affect (typically observed):  Appropriate, Quiet(Patient responded when asked a question, but was other-wise quiet during visit) Orientation:  Oriented to Self, Oriented to Place, Oriented to Situation, Oriented to  Time Alcohol / Substance use:  Never Used Psych involvement (Current and /or in the community):  No (Comment)  Discharge Needs  Concerns to be addressed:  Discharge Planning Concerns Readmission within the last 30 days:  No Current discharge risk:  None Barriers to Discharge:  [place], Kennedy, LCSW 10/14/2017, 5:52 PM

## 2017-10-14 NOTE — NC FL2 (Signed)
[place] LEVEL OF CARE SCREENING TOOL     IDENTIFICATION  Patient Name: Judy Herrera Birthdate: 02-15-1931 Sex: female Admission Date (Current Location): 10/11/2017  [facility] and [place] Number:  Herbalist and Address:  [facility], [place] [address], [place], [place] [address]      Provider Number: [id number]  Attending Physician Name and Address:  Cristal Ford, DO  Relative Name and Phone Number:  Eesha Schmaltz - son; [phone]    Current Level of Care: Hospital Recommended Level of Care: [place] Prior Approval Number:    Date Approved/Denied:   PASRR Number: [id number](Eff. 10/14/17)  Discharge Plan: SNF    Current Diagnoses: Patient Active Problem List   Diagnosis Date Noted  . Elevated troponin I measurement   . PAT (paroxysmal atrial tachycardia) ([place])   . Lung nodule 10/11/2017  . Tachycardia 10/11/2017  . Sacral fracture ([place]) 10/11/2017  . Atrial fibrillation ([place]) 10/11/2017  . Iron deficiency anemia due to chronic blood loss 09/03/2017  . Iron malabsorption 09/03/2017  . Breast cancer screening, high risk patient 04/21/2016  . Ascending aortic aneurysm ([place]) 10/04/2015  . Lymphedema of upper extremity following lymphadenectomy 04/19/2015  . Thrombocytopenia ([place]) 04/19/2015  . Absolute anemia 04/19/2015  . Ovarian cancer ([place]) 04/19/2015  . Unilateral vocal cord paralysis 04/19/2015  . Port-A-Cath in place 04/19/2015  . Ovarian epithelial cancer ([place]) 09/04/2014  . LBBB (left bundle branch block) 01/12/2014  . 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 TENDINITIS, RIGHT WRIST 11/12/2010  . SYNCOPE 09/23/2010  . BENIGN NEOPLASM SKIN OTHER&UNSPEC PARTS FACE 08/06/2010  . BACK PAIN 07/03/2010  . Hyperlipidemia 01/15/2010  . Congestive dilated cardiomyopathy ([place]) 01/15/2010  . Allergic rhinitis 08/22/2009  . Osteoporosis 02/16/2008  . COPD (chronic obstructive pulmonary  disease) ([place]) 02/04/2008  . Essential hypertension 07/13/2007  . Coronary atherosclerosis 07/13/2007  . BREAST CANCER, HX OF 07/13/2007  . COLONIC POLYPS, HX OF 07/13/2007    Orientation RESPIRATION BLADDER Height & Weight     Self, Time, Situation, Place  O2(2 Liters oxygen) External catheter Weight: 100 lb (45.4 kg) Height:  5\' 3"  (160 cm)  BEHAVIORAL SYMPTOMS/MOOD NEUROLOGICAL BOWEL NUTRITION STATUS      Continent Diet(Regular)  AMBULATORY STATUS COMMUNICATION OF NEEDS Skin   Total Care(Patient was unable to ambulate with PT) Verbally Normal                       Personal Care Assistance Level of Assistance  Bathing, Feeding, Dressing Bathing Assistance: Independent(Needs supervision) Feeding assistance: Independent Dressing Assistance: Limited assistance(Min assist upper body and total assist lower body)     Functional Limitations Info  Sight, Hearing, Speech Sight Info: Impaired Hearing Info: Impaired Speech Info: Adequate    SPECIAL CARE FACTORS FREQUENCY  PT (By licensed PT), OT (By licensed OT), Speech therapy     PT Frequency: Evaluated 11/27 OT Frequency: Evaluated 11/28     Speech Therapy Frequency: Evalusted 11/28      Contractures Contractures Info: Not present    Additional Factors Info  Code Status, Allergies Code Status Info: DNR Allergies Info: Ibuprofen, Morphine           Current Medications (10/14/2017):  This is the current hospital active medication list Current Facility-Administered Medications  Medication Dose Route Frequency Provider Last Rate Last Dose  . 0.9 %  sodium chloride infusion   Intravenous Continuous Rondel Jumbo, PA-C 75 mL/hr at 10/12/17 1440    .  acetaminophen (TYLENOL) tablet 650 mg  650 mg Oral Q6H PRN Rondel Jumbo, PA-C       Or  . acetaminophen (TYLENOL) suppository 650 mg  650 mg Rectal Q6H PRN Rondel Jumbo, PA-C      . aspirin EC tablet 81 mg  81 mg Oral QHS Rondel Jumbo, PA-C   81 mg at  10/13/17 2332  . bisacodyl (DULCOLAX) suppository 10 mg  10 mg Rectal Daily PRN Rondel Jumbo, PA-C      . heparin injection 5,000 Units  5,000 Units Subcutaneous Q8H Rai, Ripudeep K, MD   5,000 Units at 10/14/17 0640  . HYDROcodone-acetaminophen (NORCO/VICODIN) 5-325 MG per tablet 1-2 tablet  1-2 tablet Oral Q4H PRN Rondel Jumbo, PA-C   1 tablet at 10/13/17 2332  . HYDROmorphone (DILAUDID) injection 0.5 mg  0.5 mg Intravenous Q4H PRN Rondel Jumbo, PA-C   0.5 mg at 10/14/17 0920  . loperamide (IMODIUM) capsule 2 mg  2 mg Oral PRN Rondel Jumbo, PA-C      . ondansetron [facility]) tablet 4 mg  4 mg Oral Q6H PRN Rondel Jumbo, PA-C       Or  . ondansetron (ZOFRAN) injection 4 mg  4 mg Intravenous Q6H PRN Sharene Butters E, PA-C   4 mg at 10/14/17 0236  . senna-docusate (Senokot-S) tablet 1 tablet  1 tablet Oral QHS Rai, Ripudeep K, MD   1 tablet at 10/13/17 2332  . simvastatin (ZOCOR) tablet 40 mg  40 mg Oral QHS Rondel Jumbo, PA-C   40 mg at 10/13/17 2332     Discharge Medications: Please see discharge summary for a list of discharge medications.  Relevant Imaging Results:  Relevant Lab Results:   Additional Information ss#[id number]  Sable Feil, LCSW

## 2017-10-14 NOTE — Plan of Care (Signed)
  Progressing Health Behavior/Discharge Planning: Ability to manage health-related needs will improve 10/14/2017 1215 - Progressing by Harlin Heys, RN Clinical Measurements: Ability to maintain clinical measurements within normal limits will improve 10/14/2017 1215 - Progressing by Harlin Heys, RN Will remain free from infection 10/14/2017 1215 - Progressing by Harlin Heys, RN Diagnostic test results will improve 10/14/2017 1215 - Progressing by Harlin Heys, RN Respiratory complications will improve 10/14/2017 1215 - Progressing by Harlin Heys, RN Cardiovascular complication will be avoided 10/14/2017 1215 - Progressing by Harlin Heys, RN Activity: Risk for activity intolerance will decrease 10/14/2017 1215 - Progressing by Harlin Heys, RN Coping: Level of anxiety will decrease 10/14/2017 1215 - Progressing by Harlin Heys, RN Elimination: Will not experience complications related to bowel motility 10/14/2017 1215 - Progressing by Harlin Heys, RN Will not experience complications related to urinary retention 10/14/2017 1215 - Progressing by Harlin Heys, RN Skin Integrity: Risk for impaired skin integrity will decrease 10/14/2017 1215 - Progressing by Harlin Heys, RN

## 2017-10-15 ENCOUNTER — Ambulatory Visit: Payer: Self-pay | Admitting: Urology

## 2017-10-15 ENCOUNTER — Inpatient Hospital Stay (HOSPITAL_COMMUNITY): Payer: Medicare HMO

## 2017-10-15 DIAGNOSIS — R911 Solitary pulmonary nodule: Secondary | ICD-10-CM

## 2017-10-15 DIAGNOSIS — J9621 Acute and chronic respiratory failure with hypoxia: Secondary | ICD-10-CM

## 2017-10-15 DIAGNOSIS — R0602 Shortness of breath: Secondary | ICD-10-CM

## 2017-10-15 DIAGNOSIS — K909 Intestinal malabsorption, unspecified: Secondary | ICD-10-CM

## 2017-10-15 DIAGNOSIS — J9622 Acute and chronic respiratory failure with hypercapnia: Secondary | ICD-10-CM

## 2017-10-15 LAB — GLUCOSE, CAPILLARY
GLUCOSE-CAPILLARY: 126 mg/dL — AB (ref 65–99)
Glucose-Capillary: 101 mg/dL — ABNORMAL HIGH (ref 65–99)
Glucose-Capillary: 101 mg/dL — ABNORMAL HIGH (ref 65–99)
Glucose-Capillary: 197 mg/dL — ABNORMAL HIGH (ref 65–99)

## 2017-10-15 LAB — HEPATIC FUNCTION PANEL
ALT: 13 U/L — ABNORMAL LOW (ref 14–54)
AST: 18 U/L (ref 15–41)
Albumin: 1.4 g/dL — ABNORMAL LOW (ref 3.5–5.0)
Alkaline Phosphatase: 39 U/L (ref 38–126)
Bilirubin, Direct: 0.4 mg/dL (ref 0.1–0.5)
Indirect Bilirubin: 1.1 mg/dL — ABNORMAL HIGH (ref 0.3–0.9)
Total Bilirubin: 1.5 mg/dL — ABNORMAL HIGH (ref 0.3–1.2)
Total Protein: 3 g/dL — ABNORMAL LOW (ref 6.5–8.1)

## 2017-10-15 LAB — BLOOD GAS, ARTERIAL
Acid-base deficit: 8.9 mmol/L — ABNORMAL HIGH (ref 0.0–2.0)
Bicarbonate: 16.3 mmol/L — ABNORMAL LOW (ref 20.0–28.0)
Drawn by: 519031
FIO2: 100
MECHVT: 500 mL
O2 Saturation: 99.7 %
PEEP: 5 cmH2O
Patient temperature: 98.6
RATE: 18 resp/min
pCO2 arterial: 34.5 mmHg (ref 32.0–48.0)
pH, Arterial: 7.297 — ABNORMAL LOW (ref 7.350–7.450)
pO2, Arterial: 483 mmHg — ABNORMAL HIGH (ref 83.0–108.0)

## 2017-10-15 LAB — BASIC METABOLIC PANEL
Anion gap: 5 (ref 5–15)
BUN: 67 mg/dL — AB (ref 6–20)
CALCIUM: 7 mg/dL — AB (ref 8.9–10.3)
CO2: 15 mmol/L — ABNORMAL LOW (ref 22–32)
CREATININE: 1.49 mg/dL — AB (ref 0.44–1.00)
Chloride: 112 mmol/L — ABNORMAL HIGH (ref 101–111)
GFR calc Af Amer: 36 mL/min — ABNORMAL LOW (ref >60)
GFR, EST NON AFRICAN AMERICAN: 31 mL/min — AB (ref >60)
GLUCOSE: 97 mg/dL (ref 65–99)
Potassium: 4.5 mmol/L (ref 3.5–5.1)
SODIUM: 132 mmol/L — AB (ref 135–145)

## 2017-10-15 LAB — CBC
HCT: 27.5 % — ABNORMAL LOW (ref 36.0–46.0)
Hemoglobin: 8.9 g/dL — ABNORMAL LOW (ref 12.0–15.0)
MCH: 28.6 pg (ref 26.0–34.0)
MCHC: 32.4 g/dL (ref 30.0–36.0)
MCV: 88.4 fL (ref 78.0–100.0)
PLATELETS: 213 10*3/uL (ref 150–400)
RBC: 3.11 MIL/uL — ABNORMAL LOW (ref 3.87–5.11)
RDW: 18.8 % — AB (ref 11.5–15.5)
WBC: 3.2 10*3/uL — ABNORMAL LOW (ref 4.0–10.5)

## 2017-10-15 LAB — TRIGLYCERIDES: Triglycerides: 2262 mg/dL — ABNORMAL HIGH (ref <150)

## 2017-10-15 LAB — TSH: TSH: 3.112 u[IU]/mL (ref 0.350–4.500)

## 2017-10-15 LAB — MAGNESIUM: Magnesium: 1.5 mg/dL — ABNORMAL LOW (ref 1.7–2.4)

## 2017-10-15 MED ORDER — CARVEDILOL 6.25 MG PO TABS: 6.2500 mg | ORAL_TABLET | Freq: Two times a day (BID) | ORAL | 0 refills | Status: AC

## 2017-10-15 MED ORDER — ADENOSINE 6 MG/2ML IV SOLN
INTRAVENOUS | Status: AC
  Administered 2017-10-15: 6 mg via INTRAVENOUS
  Filled 2017-10-15: qty 2

## 2017-10-15 MED ORDER — MIDAZOLAM HCL 2 MG/2ML IJ SOLN
1.0000 mg | INTRAMUSCULAR | Status: DC | PRN
  Filled 2017-10-15: qty 2

## 2017-10-15 MED ORDER — FENTANYL CITRATE (PF) 100 MCG/2ML IJ SOLN
INTRAMUSCULAR | Status: AC
  Filled 2017-10-15: qty 2

## 2017-10-15 MED ORDER — PROPOFOL 1000 MG/100ML IV EMUL
INTRAVENOUS | Status: AC
  Administered 2017-10-15: 20 ug/kg/min via INTRAVENOUS
  Filled 2017-10-15: qty 100

## 2017-10-15 MED ORDER — CHLORHEXIDINE GLUCONATE 0.12% ORAL RINSE (MEDLINE KIT)
15.0000 mL | Freq: Two times a day (BID) | OROMUCOSAL | Status: DC
  Administered 2017-10-15 – 2017-10-21 (×11): 15 mL via OROMUCOSAL

## 2017-10-15 MED ORDER — INSULIN ASPART 100 UNIT/ML ~~LOC~~ SOLN
1.0000 [IU] | SUBCUTANEOUS | Status: DC
  Administered 2017-10-15: 1 [IU] via SUBCUTANEOUS

## 2017-10-15 MED ORDER — MIDAZOLAM HCL 2 MG/2ML IJ SOLN
INTRAMUSCULAR | Status: AC
  Filled 2017-10-15: qty 2

## 2017-10-15 MED ORDER — PANTOPRAZOLE SODIUM 40 MG IV SOLR
40.0000 mg | Freq: Every day | INTRAVENOUS | Status: DC
  Administered 2017-10-16 – 2017-10-17 (×2): 40 mg via INTRAVENOUS
  Filled 2017-10-15 (×2): qty 40

## 2017-10-15 MED ORDER — ORAL CARE MOUTH RINSE
15.0000 mL | Freq: Four times a day (QID) | OROMUCOSAL | Status: DC
  Administered 2017-10-16 – 2017-10-18 (×9): 15 mL via OROMUCOSAL

## 2017-10-15 MED ORDER — MIDAZOLAM HCL 2 MG/2ML IJ SOLN: 2.0000 mg | Freq: Once | INTRAMUSCULAR | Status: DC

## 2017-10-15 MED ORDER — HYDROCODONE-ACETAMINOPHEN 5-325 MG PO TABS: 1.0000 | ORAL_TABLET | ORAL | 0 refills | Status: AC | PRN

## 2017-10-15 MED ORDER — BISACODYL 10 MG RE SUPP: 10.0000 mg | Freq: Every day | RECTAL | 0 refills | Status: AC | PRN

## 2017-10-15 MED ORDER — ETOMIDATE 2 MG/ML IV SOLN
10.0000 mg | Freq: Once | INTRAVENOUS | Status: AC
  Administered 2017-10-15: 10 mg via INTRAVENOUS
  Filled 2017-10-15: qty 5

## 2017-10-15 MED ORDER — MIDAZOLAM HCL 2 MG/2ML IJ SOLN
1.0000 mg | Freq: Once | INTRAMUSCULAR | Status: AC
  Administered 2017-10-15: 1 mg via INTRAVENOUS

## 2017-10-15 MED ORDER — IPRATROPIUM-ALBUTEROL 0.5-2.5 (3) MG/3ML IN SOLN
3.0000 mL | RESPIRATORY_TRACT | Status: AC
  Administered 2017-10-15: 3 mL via RESPIRATORY_TRACT
  Filled 2017-10-15: qty 3

## 2017-10-15 MED ORDER — ADENOSINE 6 MG/2ML IV SOLN
6.0000 mg | Freq: Once | INTRAVENOUS | Status: AC
  Administered 2017-10-15: 6 mg via INTRAVENOUS

## 2017-10-15 MED ORDER — ACETAMINOPHEN 325 MG PO TABS: 650.0000 mg | ORAL_TABLET | Freq: Four times a day (QID) | ORAL | Status: AC | PRN

## 2017-10-15 MED ORDER — SENNOSIDES-DOCUSATE SODIUM 8.6-50 MG PO TABS: 1.0000 | ORAL_TABLET | Freq: Every day | ORAL | Status: AC

## 2017-10-15 MED ORDER — AMIODARONE HCL IN DEXTROSE 360-4.14 MG/200ML-% IV SOLN
30.0000 mg/h | INTRAVENOUS | Status: DC
  Administered 2017-10-16: 30 mg/h via INTRAVENOUS
  Filled 2017-10-15 (×2): qty 200

## 2017-10-15 MED ORDER — ENSURE ENLIVE PO LIQD: 237.0000 mL | Freq: Two times a day (BID) | ORAL | 12 refills | Status: AC

## 2017-10-15 MED ORDER — FUROSEMIDE 10 MG/ML IJ SOLN
20.0000 mg | Freq: Once | INTRAMUSCULAR | Status: AC
  Administered 2017-10-15: 20 mg via INTRAVENOUS
  Filled 2017-10-15: qty 4

## 2017-10-15 MED ORDER — ASPIRIN 81 MG PO TBEC: 81.0000 mg | DELAYED_RELEASE_TABLET | Freq: Every day | ORAL | Status: AC

## 2017-10-15 MED ORDER — AMIODARONE LOAD VIA INFUSION
150.0000 mg | Freq: Once | INTRAVENOUS | Status: AC
  Administered 2017-10-15: 150 mg via INTRAVENOUS
  Filled 2017-10-15: qty 83.34

## 2017-10-15 MED ORDER — PROPOFOL 1000 MG/100ML IV EMUL
0.0000 ug/kg/min | INTRAVENOUS | Status: DC
  Administered 2017-10-15 (×2): 20 ug/kg/min via INTRAVENOUS
  Filled 2017-10-15 (×2): qty 100

## 2017-10-15 MED ORDER — AMIODARONE HCL IN DEXTROSE 360-4.14 MG/200ML-% IV SOLN
60.0000 mg/h | INTRAVENOUS | Status: DC
  Administered 2017-10-15 (×2): 60 mg/h via INTRAVENOUS
  Filled 2017-10-15: qty 200

## 2017-10-15 NOTE — Progress Notes (Signed)
Pt scheduled for D/C to [facility]. Report called to receiving RN. Pt requested oral pain medication before transport and given as requested. Pt resting comfortably in bed, O2 @ 2L with no distress or c/o SOB, VSS, IV no longer functional and removed. Family at bedside.

## 2017-10-15 NOTE — Significant Event (Addendum)
Rapid Response Event Note  Overview: Time Called: 1615 Arrival Time: 1620 Event Type: Respiratory  Initial Focused Assessment:  Called to discharge holding area on 3W, room 20.  Pt in acute respiratory distress, minimally responsive, accessory muscle use, tachypnea and diffuse wheezing with coarse bilateral BS.  HR 160 - 180 on the monitor.  BP controlled throughout encounter.  Pt on NRB 15LPM at time of my arrival.     Interventions: Primary MD, Dr Ree Kida notified by staff RN,  40mg  IV lasix given per order.  Consult placed for CCM. Dr. Sabra Heck responded to bedside.  Pt placed on Bipap while awaiting for CCM to arrive.  IV access obtained.  ABG, EKG, CBG conducted.  Decision made to intubate at bedside while awaiting for ICU bed.    Pt was sedated with 10mg  Etomidate and 1 mg versed.  No complications with intubation.  PCXR conducted on arrival to 4N ICU.    Event Summary: Name of Physician Notified: Dr. Ree Kida at [phone]  Name of Consulting Physician Notified: Dr. Sabra Heck with CCM at 1630  Outcome: Transferred (Comment)  Event End Time: [place]  Pam Drown

## 2017-10-15 NOTE — Care Management Important Message (Signed)
Important Message  Patient Details  Name: Judy Herrera MRN: [id number] Date of Birth: 08-26-1931   Medicare Important Message Given:  Yes    Nathen May 10/15/2017, 11:32 AM

## 2017-10-15 NOTE — Progress Notes (Addendum)
Patient was transferred to holding area for discharge. She was noted to later have respiratory distress with oxygen saturations of 84% on 5L nasal canula. Patient was stable earlier today and was able to work with physical therapy. Spoke with RN, who also stated patient had been stable all day.   Discharged cancelled. Ordered Stat CXR, IV lasix, and breathing treatment.  Addendum: patient continued to decompensate. Rapid response called.  ABG obtained showing pH 7. Monitor showing Afib RVR, rates 150 PCCM called for intubation. Dr. Sabra Heck at bedside.   Patient will be transferred to 4N.  Updated patient's family via phone.   PROGRESS NOTE    Judy Herrera  GUY:[id number] DOB: 1931/02/25 DOA: 10/11/2017 PCP: Marletta Lor, MD   Chief Complaint  Patient presents with  . Back Pain  . Tachycardia    Brief Narrative:  HPI on 10/11/2017 by Dr. Sharene Butters, PA Judy Herrera is a 83 y.o. 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  X-rays and overall exam was unremarkable, she was ambulatory and discharged home.  However, this morning she woke up unable to get out of bed without assistance.  She had, much more severe lower back pain.  She denies any numbness or tingling, she denies any constipation or difficulty with urination or incontinence.  The patient denies any saddle anesthesia.  She denies any rest pain or palpitations. NO changes in meds or herbal products  She denies any shortness of  breath cough.  Of note, in route, EMS noted that the patient was very tachycardic, with heart rate between 110-120s.  The EMS strips suggested possible atrial fibrillation, but she was essentially asymptomatic.  She denies any syncope or presyncope, chest pain or palpitations. Denies shortness of breath  This is new to her, denies any history of atrial fibrillation.  Assessment & Plan   Acute respiratory failure -unknown cause, ?PE or DVT, as patient has a history of MAT?, breast cancer. Although patient was on heparin -PCCM called for intubation  Acute lower back pain with sacral fracture/history of osteoporosis, right L5 vertebral fracture -CT of the lumbar spine showed L5 transverse fracture and sacral insufficiency fracture -Patient received fentanyl, hydrocodone, IV fluids without synechia relief and was admitted -PT, OT consulted recommended SNF -Continue vitamin D and calcium supplementation -Continue pain control with bowel regimen  MAT/NSVT/elevated troponin -EKG showed atrial fibrillation -Currently asymptomatic -Cardiology consulted and appreciated -Patient was on IV heparin however this was discontinued -Coreg was initially held however restarted at a lower dose by cardiology -ACE inhibitor and HCTZ held due to soft BP -Echocardiogram showed EF of 40-45% with diffuse hypokinesis, reduced right ventricular systolic function -VQ scan negative for PE  Chronic systolic CHF -does not appear to be volume overloaded -echocardiogram as above -per cardiology, avoid diuretics at this time; if patient gains >3lbs, call cardiology office    Anemia of chronic disease -Patient was given. Hemoglobin 09/30/2017 -Hemoglobin appears to be stable, continue to monitor CBC  Acute kidney injury -Likely due to  tachycardia with low perfusion -HCTZ and ACE inhibitor held -Continue IV fluid hydration and monitor BMP -Creatinine currently 2.02  Breast cancer/endocrine pleural carcinoma  skin -Patient follows with Dr. Marin Olp for Stage IIIa breast cancer -Received palliative radiation of LUE  DVT Prophylaxis  heparin  Code Status: Full  Family Communication: None at bedside  Disposition Plan: admitted, will need SNF  Consultants Cardiology  Procedures  Echocardiogram  Antibiotics   Anti-infectives (From admission, onward)   None      Subjective:   Judy Herrera seen and examined earlier today.  Has no complaints today. Denies chest pain, shortness of breath, abdominal pain, nausea, vomiting, diarrhea, constipation, dizziness, headache.   Objective:   Vitals:   10/13/17 1656 10/13/17 2239 10/14/17 0434 10/14/17 0808  BP: (!) 93/50 (!) 101/54 (!) 98/52 (!) 120/56  Pulse: 86 87 81 86  Resp: 18 16 15 16   Temp: 98 F (36.7 C) 97.8 F (36.6 C) 98.2 F (36.8 C) 98.2 F (36.8 C)  TempSrc: Oral   Oral  SpO2: 97% 100% 99% 100%  Weight:  45.4 kg (100 lb)    Height:        Intake/Output Summary (Last 24 hours) at 10/14/2017 1455 Last data filed at 10/14/2017 0600 Gross per 24 hour  Intake 500 ml  Output 0 ml  Net 500 ml   Filed Weights   10/11/17 0938 10/13/17 2239  Weight: 44.5 kg (98 lb) 45.4 kg (100 lb)  Exam  General: Well developed, well nourished, NAD, appears stated age  HEENT: NCAT, mucous membranes moist.   Cardiovascular: S1 S2 auscultated, no rubs, murmurs or gallops. Regular rate and rhythm.  Respiratory: Clear to auscultation bilaterally with equal chest rise  Abdomen: Soft, nontender, nondistended, + bowel sounds  Extremities: warm dry without cyanosis clubbing or edema  Neuro: AAOx3, hard of hearing, otherwise nonfocal  Psych: Normal affect and demeanor with intact judgement and insight  Data Reviewed: I have personally reviewed following labs and imaging studies  CBC: Recent Labs  Lab 10/11/17 0956 10/13/17 0636 10/14/17 0732  WBC 7.4 4.7 2.9*  HGB 9.5* 8.9* 8.8*  HCT 29.4* 27.5* 27.0*  MCV 87.0 89.3 87.7  PLT  182 152 443   Basic Metabolic Panel: Recent Labs  Lab 10/11/17 0956 10/12/17 0143 10/13/17 0636 10/14/17 0732  NA 134* 132* 133* 133*  K 3.9 4.1 4.2 4.8  CL 103 104 108 110  CO2 21* 17* 17* 15*  GLUCOSE 142* 121* 106* 99  BUN 26* 39* 67* 83*  CREATININE 1.09* 1.50* 2.40* 2.02*  CALCIUM 8.1* 7.3* 6.4* 6.9*  MG 2.0  --   --   --    GFR: Estimated Creatinine Clearance: 14.9 mL/min (A) (by C-G formula based on SCr of 2.02 mg/dL (H)). Liver Function Tests: Recent Labs  Lab 10/12/17 0143  AST 28  ALT 24  ALKPHOS 56  BILITOT 2.1*  PROT 5.5*  ALBUMIN 2.7*   No results for input(s): LIPASE, AMYLASE in the last 168 hours. No results for input(s): AMMONIA in the last 168 hours. Coagulation Profile: Recent Labs  Lab 10/12/17 0143  INR 1.39   Cardiac Enzymes: Recent Labs  Lab 10/11/17 1504 10/11/17 1857 10/11/17 2140 10/12/17 0143  TROPONINI 1.53* 1.66* 1.56* 1.45*   BNP (last 3 results) No results for input(s): PROBNP in the last 8760 hours. HbA1C: No results for input(s): HGBA1C in the last 72 hours. CBG: No results for input(s): GLUCAP in the last 168 hours. Lipid Profile:  No results for input(s): CHOL, HDL, LDLCALC, TRIG, CHOLHDL, LDLDIRECT in the last 72 hours. Thyroid Function Tests: No results for input(s): TSH, T4TOTAL, FREET4, T3FREE, THYROIDAB in the last 72 hours. Anemia Panel: No results for input(s): VITAMINB12, FOLATE, FERRITIN, TIBC, IRON, RETICCTPCT in the last 72 hours. Urine analysis:    Component Value Date/Time   COLORURINE YELLOW 10/01/2015 [place] 10/01/2015 1155   LABSPEC 1.008 10/01/2015 1155   LABSPEC 1.010 05/07/2014 0835   PHURINE 7.5 10/01/2015 1155   GLUCOSEU NEGATIVE 10/01/2015 1155   GLUCOSEU Negative 05/07/2014 0835   HGBUR NEGATIVE 10/01/2015 1155   HGBUR 3+ 07/22/2011 1338   BILIRUBINUR moderate (A) 05/01/2017 1256   BILIRUBINUR negative 01/25/2017 1039   BILIRUBINUR Negative 05/07/2014 0835   KETONESUR  trace (5) (A) 05/01/2017 1256   KETONESUR NEGATIVE 10/01/2015 1155   PROTEINUR trace (A) 05/01/2017 1256   PROTEINUR negative 01/25/2017 1039   PROTEINUR NEGATIVE 10/01/2015 1155   UROBILINOGEN negative (A) 05/01/2017 1256   UROBILINOGEN 0.2 05/07/2014 0835   NITRITE Positive (A) 05/01/2017 1256   NITRITE negative 01/25/2017 1039   NITRITE NEGATIVE 10/01/2015 1155   LEUKOCYTESUR Large (3+) (A) 05/01/2017 1256   LEUKOCYTESUR Negative 05/07/2014 0835   Sepsis Labs: @LABRCNTIP (procalcitonin:4,lacticidven:4)  )No results found for this or any previous visit (from the past 240 hour(s)).    Radiology Studies: Dg Chest 2 View  Result Date: 10/13/2017 CLINICAL DATA:  Shortness of breath.  History of breast cancer. EXAM: CHEST  2 VIEW COMPARISON:  Single-view of the chest 10/11/2017. PA and lateral chest 11/28/2016. CT chest 10/01/2015. FINDINGS: The patient has a small to moderate left pleural effusion with basilar airspace disease, new since the prior exam. The right lung is clear. Heart size is upper normal. No pneumothorax. Aortic atherosclerosis is noted. The patient is status post left mastectomy and axillary dissection. Left shoulder replacement is identified. IMPRESSION: Small to moderate left pleural effusion with associated basilar airspace disease which could be atelectasis or infection, new since the most recent exam. Atherosclerosis. Electronically Signed   By: Inge Rise M.D.   On: 10/13/2017 14:16   Nm Pulmonary Perf And Vent  Result Date: 10/13/2017 CLINICAL DATA:  Evaluate for acute pulmonary embolus. High pretest probability EXAM: NUCLEAR MEDICINE VENTILATION - PERFUSION LUNG SCAN TECHNIQUE: Ventilation images were obtained in multiple projections using inhaled aerosol Tc-71mDTPA. Perfusion images were obtained in multiple projections after intravenous injection of Tc-954mAA. RADIOPHARMACEUTICALS:  32.0 mCi Technetium-9919mPA aerosol inhalation and 4.2 mCi Technetium-39m68mA IV COMPARISON:  Chest radiograph from 10/13/2017 FINDINGS: .: FINDINGS: . Ventilation: There is diffuse heterogeneous distribution of the radiopharmaceutical throughout both lungs. Perfusion: Mildly heterogeneous distribution of the radiopharmaceutical to both lungs. No unmatched wedge shaped peripheral perfusion defects to suggest acute pulmonary embolism. IMPRESSION: No unmatched segmental perfusion defects identified to suggest acute pulmonary embolus. Heterogeneous ventilation and perfusion to both lungs compatible with obstructive pulmonary disease. Electronically Signed   By: TaylKerby Moors.   On: 10/13/2017 13:55     Scheduled Meds: . aspirin EC  81 mg Oral QHS  . carvedilol  6.25 mg Oral BID WC  . feeding supplement (ENSURE ENLIVE)  237 mL Oral BID BM  . heparin subcutaneous  5,000 Units Subcutaneous Q8H  . senna-docusate  1 tablet Oral QHS  . simvastatin  40 mg Oral QHS   Continuous Infusions: . sodium chloride 75 mL/hr at 10/14/17 1151     LOS: 3 days   Time Spent  in minutes   45 minutes (addendum)  Lashawnna Lambrecht D.O. on 10/14/2017 at 4:19 PM  Between 7am to 7pm - Pager - [phone]  After 7pm go to [email or web address] - password [email or web address]  And look for the night coverage person covering for me after hours  [facility] Office  [phone]

## 2017-10-15 NOTE — Procedures (Signed)
Intubation Procedure Note Judy Herrera [id number] 1930-11-29  Procedure: Intubation Indications: Respiratory insufficiency  Procedure Details Consent: Unable to obtain consent because of emergent medical necessity. Time Out: Verified patient identification, verified procedure, site/side was marked, verified correct patient position, special equipment/implants available, medications/allergies/relevent history reviewed, required imaging and test results available.  Performed  Maximum sterile technique was used including gloves, hand hygiene and mask.  3    Evaluation Hemodynamic Status: BP stable throughout; O2 sats: stable throughout Patient's Current Condition: stable Complications: No apparent complications Patient did tolerate procedure well. Chest X-ray ordered to verify placement.  CXR: tube position acceptable.   Judy Herrera 10/15/2017

## 2017-10-15 NOTE — Consult Note (Signed)
PULMONARY / CRITICAL CARE MEDICINE   Name: Judy Herrera MRN: [id number] DOB: 1931/11/10    ADMISSION DATE:  10/11/2017 CONSULTATION DATE:  10/15/17  REFERRING MD:  Dr. Velta Addison  CHIEF COMPLAINT:  Respiratory failure  HISTORY OF PRESENT ILLNESS:   Service was called urgently to evaluate dyspnea. Upon arrival patient was tachypneic, diaphoretic and tachycardic. ABG's revealed combined metabolic and respiratory acidosis. Patient was emergently intubated, placed on mechanical ventilation and transferred to the icu.  PAST MEDICAL HISTORY :  She  has a past medical history of Allergy, Arthritis, Breast CA ([place]), CAD (coronary artery disease), Cancer ([place]), Cardiomyopathy ([place]), Cataract, Diverticulosis, Eccrine porocarcinoma of skin, History of chemotherapy, History of radiation therapy (1994), Hyperlipidemia, Hypertension, Iron malabsorption (09/03/2017), Osteoporosis, Ovarian ca ([place]), Sacral fracture ([place]) (09/2017), Wears dentures, and Wears glasses.  PAST SURGICAL HISTORY: She  has a past surgical history that includes left breast mastectomy (1994); Shoulder arthroscopy; right leg (1990); Shoulder surgery (1990); Modified radical mastectomy w/ axillary lymph node dissection w/ pectoralis minor excision (1994); Eye surgery; Abdominal hysterectomy (2004); Cholecystectomy (1993); Skin biopsy (04/27/2011); Mass excision (Left, 05/09/2014); and Breast surgery.  Allergies  Allergen Reactions  . Ibuprofen Nausea Only  . Morphine Nausea And Vomiting    No current facility-administered medications on file prior to encounter.    Current Outpatient Medications on File Prior to Encounter  Medication Sig  . amoxicillin (AMOXIL) 500 MG capsule TAKE 4 CAPSULES 1 HOUR PRIOR TO DENTAL APPOINTMENT  . denosumab (PROLIA) 60 MG/ML SOLN injection Inject 60 mg into the skin every 6 (six) months. Administer in upper arm, thigh, or abdomen  . lisinopril-hydrochlorothiazide (PRINZIDE,ZESTORETIC) 20-12.5 MG  tablet TAKE 1 TABLET EVERY DAY (Patient taking differently: TAKE 1 TABLET EVERY DAY AT BEDTIME)  . loperamide (IMODIUM) 2 MG capsule Take 1 capsule (2 mg total) by mouth as needed for diarrhea or loose stools.  . megestrol (MEGACE ES) 625 MG/5ML suspension Take 5 mLs (625 mg total) by mouth daily.  . simvastatin (ZOCOR) 40 MG tablet TAKE 1 TABLET AT BEDTIME (Patient taking differently: TAKE 1 TABLET (40mg )AT BEDTIME)  . carvedilol (COREG) 25 MG tablet TAKE 1 TABLET TWICE A DAY WITH FOOD (Patient not taking: Reported on 10/11/2017)  . fluticasone (FLONASE) 50 MCG/ACT nasal spray SHAKE LIQUID AND USE 2 SPRAYS IN EACH NOSTRIL DAILY (Patient not taking: Reported on 08/20/2017)  . ondansetron (ZOFRAN) 4 MG tablet Take 1 tablet (4 mg total) by mouth every 6 (six) hours. (Patient not taking: Reported on 08/20/2017)  . pantoprazole (PROTONIX) 40 MG tablet TAKE 1 TABLET BY MOUTH EVERY DAY (Patient not taking: Reported on 08/20/2017)    FAMILY HISTORY:  Her indicated that her mother is deceased. She indicated that her father is deceased. She indicated that all of her four sisters are alive. She indicated that only one of her two brothers is alive. She indicated that her maternal grandmother is deceased. She indicated that her maternal grandfather is deceased. She indicated that her paternal grandmother is deceased. She indicated that her paternal grandfather is deceased. She indicated that both of her maternal aunts are deceased. She indicated that both of her maternal uncles are deceased. She indicated that her other is deceased.   SOCIAL HISTORY: She  reports that  has never smoked. she has never used smokeless tobacco. She reports that she does not drink alcohol or use drugs.  REVIEW OF SYSTEMS:   Unobtainable.  SUBJECTIVE:  Unobtainable   VITAL SIGNS: BP 129/61 (BP Location: Right Arm)  Pulse 79   Temp 98.1 F (36.7 C) (Oral)   Resp 18   Ht 5\' 3"  (1.6 m)   Wt 51.3 kg (113 lb)   SpO2 100%    BMI 20.02 kg/m   HEMODYNAMICS:    VENTILATOR SETTINGS: Vent Mode: PRVC FiO2 (%):  [100 %] 100 % Set Rate:  [18 bmp] 18 bmp Vt Set:  [500 mL] 500 mL PEEP:  [5 cmH20] 5 cmH20 Plateau Pressure:  [24 cmH20] 24 cmH20  INTAKE / OUTPUT: I/O last 3 completed shifts: In: 4275 [I.V.:4275] Out: 0   PHYSICAL EXAMINATION: General:  Elderly female in respiratory distress Neuro:  Awake, not following commands. Moving all 4 extremities. HEENT:  ETT at 21cm. OGT. PERL. No icterus. No jvd. Cardiovascular:  Tachycardic. Normal s1s2. I can not appreciate a murmur or gallop. Lungs:  Bilateral rhonchi Abdomen:  Flat, non tender. No tympanny. Musculoskeletal:  Lymphedema of the left arm. Skin:  Warm to touch. +1 peripheral pulses.  LABS:  BMET Recent Labs  Lab 10/13/17 0636 10/14/17 0732 10/15/17 0516  NA 133* 133* 132*  K 4.2 4.8 4.5  CL 108 110 112*  CO2 17* 15* 15*  BUN 67* 83* 67*  CREATININE 2.40* 2.02* 1.49*  GLUCOSE 106* 99 97    Electrolytes Recent Labs  Lab 10/11/17 0956  10/13/17 0636 10/14/17 0732 10/15/17 0516  CALCIUM 8.1*   < > 6.4* 6.9* 7.0*  MG 2.0  --   --   --   --    < > = values in this interval not displayed.    CBC Recent Labs  Lab 10/13/17 0636 10/14/17 0732 10/15/17 0516  WBC 4.7 2.9* 3.2*  HGB 8.9* 8.8* 8.9*  HCT 27.5* 27.0* 27.5*  PLT 152 189 213    Coag's Recent Labs  Lab 10/12/17 0143  INR 1.39    Sepsis Markers No results for input(s): LATICACIDVEN, PROCALCITON, O2SATVEN in the last 168 hours.  ABG Recent Labs  Lab 10/15/17 1654  PHART 7.099*  PCO2ART 62.1*  PO2ART 300*    Liver Enzymes Recent Labs  Lab 10/12/17 0143  AST 28  ALT 24  ALKPHOS 56  BILITOT 2.1*  ALBUMIN 2.7*    Cardiac Enzymes Recent Labs  Lab 10/11/17 1857 10/11/17 2140 10/12/17 0143  TROPONINI 1.66* 1.56* 1.45*    Glucose Recent Labs  Lab 10/15/17 0803 10/15/17 1658  GLUCAP 101* 197*    Imaging Dg Chest Port 1 View  Result  Date: 10/15/2017 CLINICAL DATA:  83 year old female with a history of shortness of breath EXAM: PORTABLE CHEST 1 VIEW COMPARISON:  10/13/2017, 10/11/2017, 11/28/2016 FINDINGS: Cardiomediastinal silhouette unchanged with cardiomegaly. Fullness in the central vasculature. New opacity at the right lung base with partial obscuration of the right heart border and obscuration of the right hemidiaphragm. Incomplete imaging at the right lung base with partial exclusion. Partial obscuration of the left hemidiaphragm with blunting of left costophrenic angle. No pneumothorax. Coarsened interstitial markings and pleuroparenchymal thickening. Surgical changes of the left axilla. Surgical changes of left shoulder arthroplasty. IMPRESSION: New bilateral pleural effusions, larger on the right, with associated atelectasis/ consolidation. Surgical changes of the left axilla. Electronically Signed   By: Corrie Mckusick D.O.   On: 10/15/2017 16:35     STUDIES:  CXR 11/30: Ett in proper position. Bilateral opacities suggestive of effusions and/or pulmonary opacities. KUB 11/30: pending ECG 11/30: SVT possibly a. Fib with LBBB(which is chronic) CULTURES: none  ANTIBIOTICS: none  SIGNIFICANT EVENTS: Acute respiratory failure  requiring intubation.  LINES/TUBES: ETT 11/30 OGT 11/30   DISCUSSION: 83 y.o female developed acute hypercapnic respiratory, tachycardia requiring intubation   ASSESSMENT / PLAN:  PULMONARY A: Acute respiratory failure with hypercapnia P:   PRVC abg's  VAP bundle Sedation with propofol   CARDIOVASCULAR A:  SVT likely due to a. flutter P:  Cardiology contacted. Patient given 6mg  of adenosine x2 with brief reduction in heart rate. Strip reveals a. Flutter.  Cardiology to advise family of the need for cardioversion. May need rate controlling therapy as well (cardizem) During this consult, patient converted to NSR. Discussed with Cardiology. Plan is to start iv  amiodarone.  RENAL A:   No issues P:    GASTROINTESTINAL A:   No issues P:   ppi  HEMATOLOGIC A:   No issues P:    INFECTIOUS A:   No issues P:     ENDOCRINE A:   No issues   P:   SSI protocol  NEUROLOGIC A:   anxiety P:   RASS goal: -1 with propofol.    FAMILY  - Updates:   - Inter-disciplinary family meet or Palliative Care meeting due by:  day 7  CRITICAL CARE Performed by: [name]   Total critical care time: 60 minutes  Critical care time was exclusive of separately billable procedures and treating other patients.  Critical care was necessary to treat or prevent imminent or life-threatening deterioration.  Critical care was time spent personally by me on the following activities: development of treatment plan with patient and/or surrogate as well as nursing, discussions with consultants, evaluation of patient's response to treatment, examination of patient, obtaining history from patient or surrogate, ordering and performing treatments and interventions, ordering and review of laboratory studies, ordering and review of radiographic studies, pulse oximetry and re-evaluation of patient's condition.   Pulmonary and [place] Pager: [phone]  10/15/2017, 6:04 PM  .

## 2017-10-15 NOTE — Clinical Social Work Placement (Signed)
   CLINICAL SOCIAL WORK PLACEMENT  NOTE 10/15/17 - DISCHARGED TO [facility]  Date:  10/15/2017  Patient Details  Name: Judy Herrera MRN: [id number] Date of Birth: [birth year]  Clinical Social Work is seeking post-discharge placement for this patient at the [place] level of care (*CSW will initial, date and re-position this form in  chart as items are completed):  Yes   Patient/family provided with [facility] Work Department's list of facilities offering this level of care within the geographic area requested by the patient (or if unable, by the patient's family).  Yes   Patient/family informed of their freedom to choose among providers that offer the needed level of care, that participate in Medicare, Medicaid or managed care program needed by the patient, have an available bed and are willing to accept the patient.  Yes   Patient/family informed of [facility]'s ownership interest in [facility] and [facility], as well as of the fact that they are under no obligation to receive care at these facilities.  PASRR submitted to EDS on 10/14/17     PASRR number received on 10/14/17     Existing PASRR number confirmed on       FL2 transmitted to all facilities in geographic area requested by pt/family on 10/14/17     FL2 transmitted to all facilities within larger geographic area on       Patient informed that his/her managed care company has contracts with or will negotiate with certain facilities, including the following:        Yes   Patient/family informed of bed offers received.  Patient chooses bed at [facility]     Physician recommends and patient chooses bed at      Patient to be transferred to [facility] on  .  Patient to be transferred to facility by  ambulance    Patient family notified on  10/15/17 of transfer.  Name of family member  notified:   Son [name] by phone - [phone].  PHYSICIAN      Additional Comment:  10/15/17 - [facility] received insurance authorization this morning from [place].    _______________________________________________ Sable Feil, LCSW 10/15/2017, 3:50 PM

## 2017-10-15 NOTE — Progress Notes (Signed)
Just as Judy Herrera was preparing for transfer to the nursing home she developed abrupt tachypnea, diaphoresis and was found to have extreme tachycardia with a rate of about 180 bpm.  The rhythm was regular, wide-complex (pre-existing left bundle branch block).  ABG showed combined metabolic and respiratory acidosis. The patient was felt to be unstable and underwent emergency intubation and mechanical ventilation.  Administration of adenosine shows underlying atrial flutter.  The patient did not become hypotensive, but remained unstable.  The decision was made to proceed with emergency electrical cardioversion and consent was obtained from the patient's son, but shortly after the patient received intravenous sedation with propofol she converted to normal sinus rhythm.    Retrospectively, it is possible that a similar episode of extreme tachycardia that led to hypotension may have been responsible for her elevated cardiac enzymes, heart failure decompensation he and transient acute kidney injury.  It is likely she will have arrhythmia again in the future.  We will start intravenous amiodarone with a 24-hour load, with plan for subsequent transition to oral amiodarone.  The benefit of anticoagulation in this elderly woman with moderate anemia and multiple comorbid conditions is quite debatable. Discussed with the pulmonary critical care attending.  We agreed that we will withhold anticoagulation for the time being.  The current episode of atrial flutter was relatively brief. Discussed long-term management options with her family, including the possibility of referral to electrophysiology services and radiofrequency ablation.  Again the issues surrounding her age and comorbid conditions suggest that a more conservative approach is appropriate, at least for the time being.  Sanda Klein, MD, [facility] [phone] office [phone] pager

## 2017-10-15 NOTE — Progress Notes (Signed)
Physical Therapy Treatment Patient Details Name: Judy Herrera MRN: [id number] DOB: 1931-10-25 Today's Date: 10/15/2017    History of Present Illness Pt is a 83 y.o. 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 CXR incidentally noted question of mass in left lung apex    PT Comments    Pt up in chair on arrival, uncomfortable due to sacral pain; adjusted position for increased comfort.  Mobility remains significantly limited due to pain and weakness; also pt incontinent of urine on attempt to stand.  Continue to recommend SNF and note possible transition to postacute setting.  Will continue acute PT if pt does not transfer to SNF.    Follow Up Recommendations  SNF;Supervision/Assistance - 24 hour     Equipment Recommendations  Rolling walker with 5" wheels(has RW and cane at home, will need RW at [facility])    Recommendations for Other Services       Precautions / Restrictions Precautions Precautions: Fall Precaution Comments: chair alarm    Mobility  Bed Mobility Overal bed mobility: Needs Assistance             General bed mobility comments: pt up in chair  Transfers Overall transfer level: Needs assistance Equipment used: 1 person hand held assist Transfers: Sit to/from Stand Sit to Stand: Max assist         General transfer comment: able to achieve partial stand using 2 UEs and PT support, pt incontinent of urine with effort to stand (pericare and new pad provided)  Ambulation/Gait             General Gait Details: unable to attempt today   Stairs            Wheelchair Mobility    Modified Rankin (Stroke  Patients Only)       Balance Overall balance assessment: No apparent balance deficits (not formally assessed)   Sitting balance-Leahy Scale: Fair Sitting balance - Comments: limited sitting activity tolerance due to sacral pain                                    Cognition Arousal/Alertness: Awake/alert Behavior During Therapy: WFL for tasks assessed/performed Overall Cognitive Status: Within Functional Limits for tasks assessed                                 General Comments: agreeable to participate, limited by pain and weakness      Exercises      General Comments General comments (skin integrity, edema, etc.): on 2L O2 per [place], with mild incr WOB with attempted transfer, unable to measure SaO2       Pertinent Vitals/Pain Pain Assessment: Faces Faces Pain Scale: Hurts even more Pain Location: back/sacrum Pain Descriptors / Indicators: Grimacing;Aching Pain Intervention(s): Limited activity within patient's tolerance;Repositioned    Home Living                      Prior Function            PT Goals (  current goals can now be found in the care plan section) Acute Rehab PT Goals Patient Stated Goal: to go home Progress towards PT goals: Progressing toward goals    Frequency           PT Plan Current plan remains appropriate    Co-evaluation              AM-PAC PT "6 Clicks" Daily Activity  Outcome Measure  Difficulty turning over in bed (including adjusting bedclothes, sheets and blankets)?: Unable Difficulty moving from lying on back to sitting on the side of the bed? : Unable Difficulty sitting down on and standing up from a chair with arms (e.g., wheelchair, bedside commode, etc,.)?: Unable Help needed moving to and from a bed to chair (including a wheelchair)?: A Lot Help needed walking in hospital room?: Total Help needed climbing 3-5 steps with a railing? : Total 6 Click Score: 7    End of Session    Activity Tolerance: Patient limited by pain Patient left: in bed;with call bell/phone within reach;with chair alarm set Nurse Communication: Mobility status PT Visit Diagnosis: Muscle weakness (generalized) (M62.81);Difficulty in walking, not elsewhere classified (R26.2)     Time: 0981-1914 PT Time Calculation (min) (ACUTE ONLY): 28 min  Charges:  $Therapeutic Activity: 23-37 mins                    G Codes:       Kearney Hard, PT, DPT, MS Board Certified Geriatric Clinical Specialist   Herbie Drape 10/15/2017, 8:53 AM

## 2017-10-15 NOTE — Progress Notes (Signed)
eLink Physician-Brief Progress Note Patient Name: Judy Herrera DOB: 08-Jun-1931 MRN: [id number]   Date of Service  10/15/2017  HPI/Events of Note  Patient with marginal blood pressure started on amiodarone IV infusion load as well as propofol.   eICU Interventions  1. Continuing amiodarone IV at 60 mg per hour 2. Weaning off propofol infusion 3. Ordering Versed IV when necessary to maintain sedation goal 4. Continuous telemetry monitoring      Intervention Category Major Interventions: Hypotension - evaluation and management  Tera Partridge 10/15/2017, 9:36 PM

## 2017-10-15 NOTE — Progress Notes (Addendum)
Patient brought to [facility] unit (holding area for discharge) by 47M charge nurse. Receiving RN asked the charge nurse to have the patient's RN on 65m to call report. During assessment, patient appeared to be short of breath, she was 78% on 2L-, Patient placed on  non-rebreather mask. RN immediately called Dr. Ree Kida about pt's condition change. Rapid response and  respiratory therapist notified. RN received new orders for Stat CXR and dose of IV lasix. RN placed two new IVs. Critical care team arrived, Patient Intubated at bedside and transferred to 4N.

## 2017-10-15 NOTE — Progress Notes (Signed)
Patient was transferred by Antony Contras, RN and Dixie Dials, RN. No complications noted. Patient transferred on 2L comfortably. Meredeth Ide, RN met at bedside.

## 2017-10-15 NOTE — Discharge Summary (Signed)
Physician Discharge Summary  Judy Herrera WUG:[id number] DOB: 01-01-1931 DOA: 10/11/2017  PCP: Judy Lor, MD  Admit date: 10/11/2017 Discharge date: 10/15/2017  Time spent: 45 minutes  Recommendations for Outpatient Follow-up:  Patient will be discharged to [facility] skilled nursing facility. Continue physical and occupational rehab.  Patient will need to follow up with primary care provider within one week of discharge, repeat CBC and BMP.  Follow up with cardiology in 2-3 weeks. Patient should continue medications as prescribed.  Patient should follow a heart healthy diet.   Discharge Diagnoses:  Acute lower back pain with sacral fracture/history of osteoporosis, right L5 vertebral fracture MAT/NSVT/elevated troponin Anemia of chronic disease Acute kidney injury Breast cancer/endocrine pleural carcinoma skin  Discharge Condition: stable  Diet recommendation: heart healthy  Filed Weights   10/11/17 0938 10/13/17 2239 10/14/17 2118  Weight: 44.5 kg (98 lb) 45.4 kg (100 lb) 51.3 kg (113 lb)    History of present illness:  on 10/11/2017 by Dr. Sharene Butters, PA Judy Herrera a 83 y.o.femalewith medical history significant forhypertension, hyperlipidemia, CAD, endocrine porocarcinoma of the skinof the leftupperextremity on palliative radiation to the left arm, followed by Dr. 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 X-rays and overall exam was unremarkable, she was ambulatory and discharged home. However, this morning she woke up unable to get out of bed without assistance. She had, much more severe lower back pain.She denies any numbness or tingling, she  denies any constipation or difficulty with urination or incontinence. The patient denies any saddle anesthesia. She denies any rest pain or palpitations. NO changes in meds or herbal productsShe denies any shortness of breath cough. Of note, in route, EMS noted that the patient was very tachycardic, with heart rate between 110-120s. The EMS strips suggested possible atrial fibrillation, but she was essentially asymptomatic. She denies any syncope or presyncope, chest pain or palpitations. Denies shortness of breathThis is new to her, denies any history of atrial fibrillation.  Hospital Course:  Acute lower back pain with sacral fracture/history of osteoporosis, right L5 vertebral fracture -CT of the lumbar spine showed L5 transverse fracture and sacral insufficiency fracture -Patient received fentanyl, hydrocodone, IV fluids without synechia relief and was admitted -PT, OT consulted recommended SNF -Continue vitamin D and calcium supplementation -Continue pain control with bowel regimen  MAT/NSVT/elevated troponin -EKG showed atrial fibrillation -Currently asymptomatic -Cardiology consulted and appreciated -Patient was on IV heparin however this was discontinued -Coreg was initially held however is being restarted at a lower dose by cardiology- dose to be increased as an outpatient -ACE inhibitor and HCTZ held due to soft BP -Echocardiogram showed EF of 40-45% with diffuse hypokinesis, reduced right ventricular systolic function -VQ scan negative for PE  Chronic systolic CHF -does not appear to be volume overloaded -echocardiogram as above -per cardiology, avoid diuretics at this time; if patient gains >3lbs, call cardiology office  Anemia of chronic disease -Patient was given. Hemoglobin 09/30/2017 -Hemoglobin appears to be stable, currently 8.9 -repeat CBC in one week  Acute kidney injury -Likely due to tachycardia with low perfusion -HCTZ and ACE inhibitor held -was  placed on IV fluid hydration -Creatinine currently 1.49 -Repeat BMP in one week  Breast  cancer/endocrine pleural carcinoma skin -Patient follows with Judy Herrera for Stage IIIa breast cancer -Received palliative radiation of LUE  Consultants Cardiology  Procedures  Echocardiogram  Discharge Exam: Vitals:   10/15/17 0405 10/15/17 0800  BP: 125/60 132/68  Pulse: 78 70  Resp: 16 16  Temp: 98 F (36.7 C) 98 F (36.7 C)  SpO2: 97% 100%   Patient has no complaints today. Denies chest pain, shortness of breath, abdominal pain, nausea, vomiting, diarrhea, constipation.    General: Well developed, thin, elderly, NAD  HEENT: NCAT, mucous membranes moist.  Cardiovascular: S1 S2 auscultated, RRR, no murmurs  Respiratory: Clear to auscultation bilaterally with equal chest rise  Abdomen: Soft, nontender, nondistended, + bowel sounds  Extremities: warm dry without cyanosis clubbing or edema  Neuro: AAOx3, hard of hearing, otherwise nonfocal  Psych: Appropriate and pleasant  Discharge Instructions Discharge Instructions    Amb referral to AFIB Clinic   Complete by:  As directed    Discharge instructions   Complete by:  As directed    Patient will be discharged to [facility] skilled nursing facility. Continue physical and occupational rehab.  Patient will need to follow up with primary care provider within one week of discharge, repeat CBC and BMP.  Follow up with cardiology in 2-3 weeks. Patient should continue medications as prescribed.  Patient should follow a heart healthy diet.     Allergies as of 10/15/2017      Reactions   Ibuprofen Nausea Only   Morphine Nausea And Vomiting      Medication List    STOP taking these medications   lisinopril-hydrochlorothiazide 20-12.5 MG tablet Commonly known as:  PRINZIDE,ZESTORETIC   pantoprazole 40 MG tablet Commonly known as:  PROTONIX     TAKE these medications   acetaminophen 325 MG tablet Commonly known as:   TYLENOL Take 2 tablets (650 mg total) by mouth every 6 (six) hours as needed for mild pain (or Fever >/= 101).   amoxicillin 500 MG capsule Commonly known as:  AMOXIL TAKE 4 CAPSULES 1 HOUR PRIOR TO DENTAL APPOINTMENT   aspirin 81 MG EC tablet Take 1 tablet (81 mg total) by mouth at bedtime.   bisacodyl 10 MG suppository Commonly known as:  DULCOLAX Place 1 suppository (10 mg total) rectally daily as needed for moderate constipation.   carvedilol 6.25 MG tablet Commonly known as:  COREG Take 1 tablet (6.25 mg total) by mouth 2 (two) times daily with a meal. What changed:    medication strength  how much to take   denosumab 60 MG/ML Soln injection Commonly known as:  PROLIA Inject 60 mg into the skin every 6 (six) months. Administer in upper arm, thigh, or abdomen   feeding supplement (ENSURE ENLIVE) Liqd Take 237 mLs by mouth 2 (two) times daily between meals.   fluticasone 50 MCG/ACT nasal spray Commonly known as:  FLONASE SHAKE LIQUID AND USE 2 SPRAYS IN EACH NOSTRIL DAILY   HYDROcodone-acetaminophen 5-325 MG tablet Commonly known as:  NORCO/VICODIN Take 1-2 tablets by mouth every 4 (four) hours as needed for moderate pain.   loperamide 2 MG capsule Commonly known as:  IMODIUM Take 1 capsule (2 mg total) by mouth as needed for diarrhea or loose stools.   megestrol 625 MG/5ML suspension Commonly known as:  MEGACE ES Take 5 mLs (625 mg total) by mouth daily.   ondansetron 4 MG tablet Commonly known as:  ZOFRAN Take 1 tablet (4 mg total) by mouth every 6 (six) hours.  senna-docusate 8.6-50 MG tablet Commonly known as:  Senokot-S Take 1 tablet by mouth at bedtime.   simvastatin 40 MG tablet Commonly known as:  ZOCOR TAKE 1 TABLET AT BEDTIME What changed:    how much to take  how to take this  when to take this      Allergies  Allergen Reactions  . Ibuprofen Nausea Only  . Morphine Nausea And Vomiting   Follow-up Information    [facility].   Specialty:  Emergency Medicine Why:  If symptoms worsen Contact information: [address] [id number] [place]       Judy Lor, MD Follow up.   Specialty:  Internal Medicine Contact information: [place] [address] [phone]        [facility], [place] Follow up.   Specialties:  Cardiology, Radiology Why:  [facility] - 10/26/17 at 9:30am. Arrive 15 minutes early. Isaac Laud is one of the PAs with Dr. Stanford Breed. Please weigh self daily and call if you gain 3 pounds (or more) from baseline so we can adjust medicines. Contact information: [address] [place] [address] [phone]            The results of significant diagnostics from this hospitalization (including imaging, microbiology, ancillary and laboratory) are listed below for reference.    Significant Diagnostic Studies: Dg Chest 1 View  Addendum Date: 10/11/2017   ADDENDUM REPORT: 10/11/2017 11:56 ADDENDUM: These results were called by telephone at the time of interpretation on 10/11/2017 at 11:56 am to Dr. Brantley Stage , who verbally acknowledged these results. Electronically Signed   By: Nolon Nations M.D.   On: 10/11/2017 11:56   Result Date: 10/11/2017 CLINICAL DATA:  New onset AFib. Fell down yesterday. Lower back pain. EXAM: CHEST 1 VIEW COMPARISON:  11/28/2016 FINDINGS: Lungs are hyperinflated. Heart size is enlarged. There is increased left apical opacity, raising the question of pleural thickening or mass. Further evaluation with CT is recommended. Prominence of the right hilar region appears stable over time. Surgical clips overlie the left axilla. IMPRESSION: 1. Hyperinflation and stable cardiomegaly. 2. Question of mass the left lung apex. Further evaluation is CT of the chest is recommended. Intravenous contrast is recommended unless contraindicated. 3. These  results will be called to the ordering clinician or representative by the Radiologist Assistant, and communication documented in the PACS or zVision Dashboard. Electronically Signed: By: Nolon Nations M.D. On: 10/11/2017 11:43   Dg Chest 2 View  Result Date: 10/13/2017 CLINICAL DATA:  Shortness of breath.  History of breast cancer. EXAM: CHEST  2 VIEW COMPARISON:  Single-view of the chest 10/11/2017. PA and lateral chest 11/28/2016. CT chest 10/01/2015. FINDINGS: The patient has a small to moderate left pleural effusion with basilar airspace disease, new since the prior exam. The right lung is clear. Heart size is upper normal. No pneumothorax. Aortic atherosclerosis is noted. The patient is status post left mastectomy and axillary dissection. Left shoulder replacement is identified. IMPRESSION: Small to moderate left pleural effusion with associated basilar airspace disease which could be atelectasis or infection, new since the most recent exam. Atherosclerosis. Electronically Signed   By: Inge Rise M.D.   On: 10/13/2017 14:16   Dg Lumbar Spine Complete  Result Date: 10/11/2017 CLINICAL DATA:  Fall yesterday.  Back pain EXAM: LUMBAR SPINE - COMPLETE 4+ VIEW COMPARISON:  CT abdomen pelvis 05/02/2013 FINDINGS: Normal alignment. Severe chronic compression  fracture T12 unchanged from 2014. No acute fracture. Mild disc degeneration L2-3 and L3-4.  Negative for pars defect Advanced atherosclerotic calcification the thoracic and abdominal aorta with prior retroperitoneal surgery and multiple metal clips. Gallbladder clips. IMPRESSION: Severe chronic compression fracture T12.  No acute fracture. Electronically Signed   By: Franchot Gallo M.D.   On: 10/11/2017 11:43   Ct Lumbar Spine Wo Contrast  Result Date: 10/11/2017 CLINICAL DATA:  Severe low back pain after sitting down quickly in a chair. EXAM: CT LUMBAR SPINE WITHOUT CONTRAST TECHNIQUE: Multidetector CT imaging of the lumbar spine was  performed without intravenous contrast administration. Multiplanar CT image reconstructions were also generated. COMPARISON:  Lumbar spine radiographs 10/11/2017. CT abdomen and pelvis 05/02/2013. FINDINGS: Segmentation: 5 lumbar type vertebrae. Alignment: Mild upper lumbar levoscoliosis. Trace retrolisthesis of L2 on L3. Vertebrae: Chronic severe T12 vertebral fracture with diffuse vertebral body sclerosis and complete height loss centrally. T12 vertebral retropulsion measures 5 mm and results in mild spinal stenosis. There are acute mildly displaced fractures through the sacral ala bilaterally. There is also a nondisplaced right L5 transverse process fracture. No suspicious osseous lesion is identified. Paraspinal and other soft tissues: Aortoiliac atherosclerosis without aneurysm. Bilateral renal atrophy. Trace right and small left pleural effusions. Prior cholecystectomy. Disc levels: At L2-3, there is mild-to-moderate disc space narrowing and degenerative endplate sclerosis with circumferential disc bulging resulting in mild right neural foraminal stenosis and likely right lateral recess stenosis. Milder disc bulging is present at L3-4 and L4-5 resulting in mild neural foraminal narrowing but no significant spinal stenosis. IMPRESSION: 1. Acute sacral insufficiency type fractures. 2. Nondisplaced right L5 transverse process fracture. 3. Chronic T12 fracture with vertebra plana configuration, retropulsion, and mild spinal stenosis. 4.  Aortic Atherosclerosis (ICD10-I70.0). Electronically Signed   By: Logan Bores M.D.   On: 10/11/2017 14:05   US Renal  Result Date: 10/14/2017 CLINICAL DATA:  Acute kidney injury EXAM: RENAL / URINARY TRACT ULTRASOUND COMPLETE COMPARISON:  None. FINDINGS: Right Kidney: Length: 10 cm. Generalized cortical thinning. No hydronephrosis. No perinephric collection or mass. Left Kidney: Length: 8.6 cm. Generalized cortical thinning. There is a dilated renal pelvis without  hydronephrosis. No perinephric collection or mass. Bladder: Moderately distended.  Bilateral ureteral jets were noted. Bilateral pleural effusion was partially visualized. IMPRESSION: 1. Negative for hydronephrosis or other acute finding. 2. Bilateral renal atrophy. 3. Bilateral pleural effusion that are small based on radiography yesterday. Electronically Signed   By: Monte Fantasia M.D.   On: 10/14/2017 17:05   Nm Pulmonary Perf And Vent  Result Date: 10/13/2017 CLINICAL DATA:  Evaluate for acute pulmonary embolus. High pretest probability EXAM: NUCLEAR MEDICINE VENTILATION - PERFUSION LUNG SCAN TECHNIQUE: Ventilation images were obtained in multiple projections using inhaled aerosol Tc-68mDTPA. Perfusion images were obtained in multiple projections after intravenous injection of Tc-911mAA. RADIOPHARMACEUTICALS:  32.0 mCi Technetium-9925mPA aerosol inhalation and 4.2 mCi Technetium-69m46m IV COMPARISON:  Chest radiograph from 10/13/2017 FINDINGS: .: FINDINGS: . Ventilation: There is diffuse heterogeneous distribution of the radiopharmaceutical throughout both lungs. Perfusion: Mildly heterogeneous distribution of the radiopharmaceutical to both lungs. No unmatched wedge shaped peripheral perfusion defects to suggest acute pulmonary embolism. IMPRESSION: No unmatched segmental perfusion defects identified to suggest acute pulmonary embolus. Heterogeneous ventilation and perfusion to both lungs compatible with obstructive pulmonary disease. Electronically Signed   By: TaylKerby Moors.   On: 10/13/2017 13:55   Dg Hip Unilat  With Pelvis 2-3 Views Right  Result Date: 10/08/2017 CLINICAL DATA:  Right hip pain after fall. EXAM: DG HIP (WITH OR WITHOUT PELVIS) 2-3V RIGHT COMPARISON:  CT abdomen pelvis dated May 02, 2013. FINDINGS: No acute fracture or malalignment. Mild bilateral hip joint space narrowing. The pubic symphysis and sacroiliac joints are intact. Diffuse osteopenia. Multiple surgical clips  in the pelvis. Atherosclerotic vascular calcifications. IMPRESSION: No acute osseous abnormality. If occult hip fracture is suspected or if the patient is unable to bear weight, MRI is the preferred modality for further evaluation. Electronically Signed   By: Titus Dubin M.D.   On: 10/08/2017 09:24    Microbiology: No results found for this or any previous visit (from the past 240 hour(s)).   Labs: Basic Metabolic Panel: Recent Labs  Lab 10/11/17 0956 10/12/17 0143 10/13/17 0636 10/14/17 0732 10/15/17 0516  NA 134* 132* 133* 133* 132*  K 3.9 4.1 4.2 4.8 4.5  CL 103 104 108 110 112*  CO2 21* 17* 17* 15* 15*  GLUCOSE 142* 121* 106* 99 97  BUN 26* 39* 67* 83* 67*  CREATININE 1.09* 1.50* 2.40* 2.02* 1.49*  CALCIUM 8.1* 7.3* 6.4* 6.9* 7.0*  MG 2.0  --   --   --   --    Liver Function Tests: Recent Labs  Lab 10/12/17 0143  AST 28  ALT 24  ALKPHOS 56  BILITOT 2.1*  PROT 5.5*  ALBUMIN 2.7*   No results for input(s): LIPASE, AMYLASE in the last 168 hours. No results for input(s): AMMONIA in the last 168 hours. CBC: Recent Labs  Lab 10/11/17 0956 10/13/17 0636 10/14/17 0732 10/15/17 0516  WBC 7.4 4.7 2.9* 3.2*  HGB 9.5* 8.9* 8.8* 8.9*  HCT 29.4* 27.5* 27.0* 27.5*  MCV 87.0 89.3 87.7 88.4  PLT 182 152 189 213   Cardiac Enzymes: Recent Labs  Lab 10/11/17 1504 10/11/17 1857 10/11/17 2140 10/12/17 0143  TROPONINI 1.53* 1.66* 1.56* 1.45*   BNP: BNP (last 3 results) No results for input(s): BNP in the last 8760 hours.  ProBNP (last 3 results) No results for input(s): PROBNP in the last 8760 hours.  CBG: Recent Labs  Lab 10/15/17 0803  GLUCAP 101*       Signed:  Annis Lagoy  [facility] 10/15/2017, 1:44 PM

## 2017-10-16 ENCOUNTER — Encounter (HOSPITAL_COMMUNITY): Payer: Self-pay

## 2017-10-16 ENCOUNTER — Inpatient Hospital Stay (HOSPITAL_COMMUNITY): Payer: Medicare HMO

## 2017-10-16 DIAGNOSIS — J9622 Acute and chronic respiratory failure with hypercapnia: Secondary | ICD-10-CM

## 2017-10-16 DIAGNOSIS — J9621 Acute and chronic respiratory failure with hypoxia: Secondary | ICD-10-CM

## 2017-10-16 DIAGNOSIS — L8990 Pressure ulcer of unspecified site, unspecified stage: Secondary | ICD-10-CM

## 2017-10-16 LAB — CBC
HCT: 26.7 % — ABNORMAL LOW (ref 36.0–46.0)
Hemoglobin: 8.5 g/dL — ABNORMAL LOW (ref 12.0–15.0)
MCH: 28.5 pg (ref 26.0–34.0)
MCHC: 31.8 g/dL (ref 30.0–36.0)
MCV: 89.6 fL (ref 78.0–100.0)
Platelets: 157 K/uL (ref 150–400)
RBC: 2.98 MIL/uL — ABNORMAL LOW (ref 3.87–5.11)
RDW: 18.9 % — ABNORMAL HIGH (ref 11.5–15.5)
WBC: 3.3 K/uL — ABNORMAL LOW (ref 4.0–10.5)

## 2017-10-16 LAB — GLUCOSE, CAPILLARY
GLUCOSE-CAPILLARY: 109 mg/dL — AB (ref 65–99)
GLUCOSE-CAPILLARY: 112 mg/dL — AB (ref 65–99)
GLUCOSE-CAPILLARY: 124 mg/dL — AB (ref 65–99)
Glucose-Capillary: 101 mg/dL — ABNORMAL HIGH (ref 65–99)
Glucose-Capillary: 108 mg/dL — ABNORMAL HIGH (ref 65–99)

## 2017-10-16 LAB — BASIC METABOLIC PANEL WITH GFR
Anion gap: 9 (ref 5–15)
BUN: 55 mg/dL — ABNORMAL HIGH (ref 6–20)
CO2: 19 mmol/L — ABNORMAL LOW (ref 22–32)
Calcium: 7.2 mg/dL — ABNORMAL LOW (ref 8.9–10.3)
Chloride: 109 mmol/L (ref 101–111)
Creatinine, Ser: 1.32 mg/dL — ABNORMAL HIGH (ref 0.44–1.00)
GFR calc Af Amer: 42 mL/min — ABNORMAL LOW
GFR calc non Af Amer: 36 mL/min — ABNORMAL LOW
Glucose, Bld: 99 mg/dL (ref 65–99)
Potassium: 4.2 mmol/L (ref 3.5–5.1)
Sodium: 137 mmol/L (ref 135–145)

## 2017-10-16 LAB — BLOOD GAS, ARTERIAL
Acid-base deficit: 4.9 mmol/L — ABNORMAL HIGH (ref 0.0–2.0)
Bicarbonate: 18.6 mmol/L — ABNORMAL LOW (ref 20.0–28.0)
Drawn by: 28340
FIO2: 50
MECHVT: 500 mL
O2 Saturation: 99 %
PEEP: 5 cmH2O
Patient temperature: 98.6
RATE: 18 {breaths}/min
pCO2 arterial: 28.1 mmHg — ABNORMAL LOW (ref 32.0–48.0)
pH, Arterial: 7.435 (ref 7.350–7.450)
pO2, Arterial: 134 mmHg — ABNORMAL HIGH (ref 83.0–108.0)

## 2017-10-16 MED ORDER — INSULIN ASPART 100 UNIT/ML ~~LOC~~ SOLN: 0.0000 [IU] | Freq: Every day | SUBCUTANEOUS | Status: DC

## 2017-10-16 MED ORDER — FENTANYL CITRATE (PF) 100 MCG/2ML IJ SOLN
25.0000 ug | Freq: Once | INTRAMUSCULAR | Status: AC
Start: 2017-10-16 — End: 2017-10-16
  Administered 2017-10-16: 25 ug via INTRAVENOUS
  Filled 2017-10-16: qty 2

## 2017-10-16 MED ORDER — FENTANYL CITRATE (PF) 100 MCG/2ML IJ SOLN
25.0000 ug | INTRAMUSCULAR | Status: DC | PRN
  Administered 2017-10-17: 50 ug via INTRAVENOUS
  Administered 2017-10-18: 25 ug via INTRAVENOUS
  Filled 2017-10-16 (×2): qty 2

## 2017-10-16 MED ORDER — ALBUTEROL SULFATE (2.5 MG/3ML) 0.083% IN NEBU
2.5000 mg | INHALATION_SOLUTION | RESPIRATORY_TRACT | Status: DC | PRN
  Administered 2017-10-16 – 2017-10-21 (×2): 2.5 mg via RESPIRATORY_TRACT
  Filled 2017-10-16 (×2): qty 3

## 2017-10-16 MED ORDER — INSULIN ASPART 100 UNIT/ML ~~LOC~~ SOLN
0.0000 [IU] | Freq: Three times a day (TID) | SUBCUTANEOUS | Status: DC
  Administered 2017-10-17 – 2017-10-18 (×4): 1 [IU] via SUBCUTANEOUS
  Administered 2017-10-18: 2 [IU] via SUBCUTANEOUS
  Administered 2017-10-19 (×3): 1 [IU] via SUBCUTANEOUS
  Administered 2017-10-20 (×2): 2 [IU] via SUBCUTANEOUS

## 2017-10-16 MED ORDER — IPRATROPIUM-ALBUTEROL 0.5-2.5 (3) MG/3ML IN SOLN
3.0000 mL | Freq: Three times a day (TID) | RESPIRATORY_TRACT | Status: DC
  Administered 2017-10-16 – 2017-10-21 (×15): 3 mL via RESPIRATORY_TRACT
  Filled 2017-10-16 (×15): qty 3

## 2017-10-16 MED ORDER — METHYLPREDNISOLONE SODIUM SUCC 40 MG IJ SOLR
40.0000 mg | Freq: Three times a day (TID) | INTRAMUSCULAR | Status: DC
  Administered 2017-10-16 – 2017-10-18 (×6): 40 mg via INTRAVENOUS
  Filled 2017-10-16 (×6): qty 1

## 2017-10-16 NOTE — Progress Notes (Signed)
Reduced propofol and contacted CCM regarding hypotension. No interventions at this time. Physician will be on the unit within 15 minutes to assess the patient. Pt alert, oriented, following commands, and weaning on the ventilator.

## 2017-10-16 NOTE — Consult Note (Signed)
PULMONARY / CRITICAL CARE MEDICINE   Name: Judy Herrera MRN: [id number] DOB: 06/16/1931    ADMISSION DATE:  10/11/2017 CONSULTATION DATE:  10/15/17  REFERRING MD:  Dr. Ree Kida, [facility]  CHIEF COMPLAINT:  Respiratory failure  HISTORY OF PRESENT ILLNESS:   83 yo female presented with back and hip pain from acute sacral insufficiency fracture, nondisplaced Rt L5 transverse process fracture.  She was also found to have tachycardia with PSVT.  She developed progressive respiratory distress and required intubation 11/30.  PMHx of HTN, CAD, HLD, Endocrine porocarcinoma of Lt arm, Breast cancer, ovarian Cancer  SUBJECTIVE:  Denies chest pain.  Tolerating SBT.  VITAL SIGNS: BP 113/60   Pulse 81   Temp 99 F (37.2 C) (Axillary) Comment: Simultaneous filing. User may not have seen previous data. Comment (Src): Simultaneous filing. User may not have seen previous data.  Resp (!) 26   Ht 5\' 3"  (1.6 m)   Wt 108 lb 14.5 oz (49.4 kg)   SpO2 99%   BMI 19.29 kg/m   VENTILATOR SETTINGS: Vent Mode: PSV FiO2 (%):  [40 %-100 %] 40 % Set Rate:  [18 bmp] 18 bmp Vt Set:  [500 mL] 500 mL PEEP:  [5 cmH20] 5 cmH20 Pressure Support:  [10 cmH20] 10 cmH20 Plateau Pressure:  [24 cmH20] 24 cmH20  INTAKE / OUTPUT: I/O last 3 completed shifts: In: 4151.6 [P.O.:480; I.V.:3631.6; NG/GT:40] Out: 2550 [Urine:1700; Emesis/NG output:850]  PHYSICAL EXAMINATION:  General - cachectic Eyes - pupils reactive ENT - no sinus tenderness, no oral exudate, no LAN Cardiac - irregular, no murmur Chest - no wheeze, rales Abd - soft, non tender Ext - no edema Skin - no rashes Neuro - follows commands  LABS:  BMET Recent Labs  Lab 10/14/17 0732 10/15/17 0516 10/16/17 0411  NA 133* 132* 137  K 4.8 4.5 4.2  CL 110 112* 109  CO2 15* 15* 19*  BUN 83* 67* 55*  CREATININE 2.02* 1.49* 1.32*  GLUCOSE 99 97 99    Electrolytes Recent Labs  Lab 10/11/17 0956  10/14/17 0732 10/15/17 0516 10/15/17 1950  10/16/17 0411  CALCIUM 8.1*   < > 6.9* 7.0*  --  7.2*  MG 2.0  --   --   --  1.5*  --    < > = values in this interval not displayed.    CBC Recent Labs  Lab 10/14/17 0732 10/15/17 0516 10/16/17 0411  WBC 2.9* 3.2* 3.3*  HGB 8.8* 8.9* 8.5*  HCT 27.0* 27.5* 26.7*  PLT 189 213 157    Coag's Recent Labs  Lab 10/12/17 0143  INR 1.39    Sepsis Markers No results for input(s): LATICACIDVEN, PROCALCITON, O2SATVEN in the last 168 hours.  ABG Recent Labs  Lab 10/15/17 1654 10/15/17 1724 10/16/17 0330  PHART 7.099* 7.297* 7.435  PCO2ART 62.1* 34.5 28.1*  PO2ART 300* 483* 134*    Liver Enzymes Recent Labs  Lab 10/12/17 0143 10/15/17 1950  AST 28 18  ALT 24 13*  ALKPHOS 56 39  BILITOT 2.1* 1.5*  ALBUMIN 2.7* 1.4*    Cardiac Enzymes Recent Labs  Lab 10/11/17 1857 10/11/17 2140 10/12/17 0143  TROPONINI 1.66* 1.56* 1.45*    Glucose Recent Labs  Lab 10/15/17 0803 10/15/17 1658 10/15/17 2038 10/15/17 2356 10/16/17 0327 10/16/17 0818  GLUCAP 101* 197* 126* 101* 109* 101*    Imaging Dg Abd 1 View  Result Date: 10/15/2017 CLINICAL DATA:  Evaluate OG tube placement. EXAM: ABDOMEN - 1 VIEW COMPARISON:  None. FINDINGS: The OG tube terminates in left upper quadrant/stomach. IMPRESSION: The OG tube terminates in the stomach. Electronically Signed   By: Dorise Bullion III M.D   On: 10/15/2017 18:56   Dg Chest Port 1 View  Result Date: 10/16/2017 CLINICAL DATA:  CHF, shortness of Breath EXAM: PORTABLE CHEST 1 VIEW COMPARISON:  10/15/2017 FINDINGS: Tracheostomy and NG tube are unchanged. There is hyperinflation of the lungs compatible with COPD. Left apical pleural/parenchymal thickening and scarring. Probable right effusion and bibasilar atelectasis. No real change since prior study. IMPRESSION: No interval change. Electronically Signed   By: Rolm Baptise M.D.   On: 10/16/2017 07:13   Dg Chest Port 1 View  Result Date: 10/15/2017 CLINICAL DATA:  Intubation  EXAM: PORTABLE CHEST 1 VIEW COMPARISON:  Portable exam 1733 hours compared to 1615 hours FINDINGS: New endotracheal tube with tip projecting 3.6 cm above carina. Nasogastric tube extending into stomach. Normal heart size, mediastinal contours, and pulmonary vascularity. Lungs emphysematous with probable effusion at RIGHT base. Probable bibasilar atelectasis. Upper lungs clear. No pneumothorax. Volume loss in the LEFT upper lobe with superior retraction LEFT hilum and question old granulomatous disease. Asymmetric LEFT apical scarring. Bones demineralized. Surgical clips at LEFT axilla. IMPRESSION: Tube positions as above. COPD changes with LEFT upper lobe volume loss, probable RIGHT pleural effusion and bibasilar atelectasis. No active disease. Electronically Signed   By: Lavonia Dana M.D.   On: 10/15/2017 18:08   Dg Chest Port 1 View  Result Date: 10/15/2017 CLINICAL DATA:  83 year old female with a history of shortness of breath EXAM: PORTABLE CHEST 1 VIEW COMPARISON:  10/13/2017, 10/11/2017, 11/28/2016 FINDINGS: Cardiomediastinal silhouette unchanged with cardiomegaly. Fullness in the central vasculature. New opacity at the right lung base with partial obscuration of the right heart border and obscuration of the right hemidiaphragm. Incomplete imaging at the right lung base with partial exclusion. Partial obscuration of the left hemidiaphragm with blunting of left costophrenic angle. No pneumothorax. Coarsened interstitial markings and pleuroparenchymal thickening. Surgical changes of the left axilla. Surgical changes of left shoulder arthroplasty. IMPRESSION: New bilateral pleural effusions, larger on the right, with associated atelectasis/ consolidation. Surgical changes of the left axilla. Electronically Signed   By: Corrie Mckusick D.O.   On: 10/15/2017 16:35     STUDIES:  CT lumbar spine 11/26 >> acute sacral insufficiency fx, non displaced rt L5 transverse process fx Echo 11/27 >> EF 40 to 45%, mod  LVH, mod MR, PAS 34 mmHg V/Q scan 11/28 >> suggestive of obstructive lung disease Renal u/s 11/29 >> b/l renal atrophy  SIGNIFICANT EVENTS: 11/30 to ICU  LINES/TUBES: ETT 11/30 >> 12/01  ASSESSMENT / PLAN:  Acute hypoxic/hypercapnic respiratory failure in setting of acute systolic/diastolic CHF and obstructive lung disease. - proceed with extubation - f/u CXR intermittently - oxygen to keep SpO2 90 to 95% - prn Bipap - add BDs  Acute on chronic systolic CHF. Tachycardia with PSVT. Mitral regurgitation. - per cardiology  Cancer related cachexia. - advance diet after extubation  Hypertriglyceridemia >> 2262 from 11/30. - d/c diprivan - f/u level  Sacral fracture with hx of osteoporosis. Rt L5 transverse process fx. - pain meds - PT/OT  DVT prophylaxis - SUP - protonix Nutrition - advance to regular diet after extubation Goals of care - full code  Updated pt's family at bedside  CC time 34 minutes  Chesley Mires, MD [facility] 10/16/2017, 11:35 AM Pager:  [phone] After 3pm call: [phone]

## 2017-10-16 NOTE — Progress Notes (Signed)
Noted to have secretions in posterior pharynx and stridor after extubation.  She is maintaining SpO2 100% on 3 liters oxygen.  Has some accessory muscle use.  She denies chest pain.  Able to cough and speaks in full sentences.  Mild stridor and gurgle over neck noted on exam.  Will try on Bipap and add solumedrol.  If no improvement, then will need reintubation.  Additional CC time 21 minutes  Chesley Mires, MD [facility] Pulmonary/Critical Care 10/16/2017, 4:35 PM Pager:  [phone] After 3pm call: [phone]

## 2017-10-16 NOTE — Progress Notes (Signed)
Nutrition Follow-up  DOCUMENTATION CODES:   Underweight  INTERVENTION:  - Diet advancement as medically feasible. - RD will monitor for nutrition-related needs at time of follow-up.   NUTRITION DIAGNOSIS:   Inadequate oral intake related to inability to eat as evidenced by NPO status. -revised, ongoing  GOAL:   Patient will meet greater than or equal to 90% of their needs -unmet  MONITOR:   Diet advancement, Weight trends, Labs, Skin  REASON FOR ASSESSMENT:   Ventilator  ASSESSMENT:   83 yo female admitted with acute lower back pain with sacral fracture, right L5 vertebral fracture with hx of osteoporosis. Pt with hx of HTN, HLD, CAD, breast cancer, endocrine pleural carcinoma skin  12/1 Pt was to d/c to [facility] yesterday afternoon/evening. While in the holding area for d/c, she became SOB and O2 was 78% on 2L via [place]. Rapid Response called and pt was emergently intubated and transferred to [place]. OGT was placed. Pt remains intubated at this time with OGT to LIS with last output of 50cc ~10 AM this AM. Son at bedside at time of RD visit. Weight has fluctuated since admission; will continue to monitor weight trends closely.  Pt was extubated to 3L [place] ~15 minutes ago. Will monitor for diet advancement and associated nutrition-related needs.  Medications reviewed; 20 mg IV Lasix x1 dose yesterday, 40 mg IV Protonix/day, 1 tablet Senokot/day. Labs reviewed; CBGs: 109 and 101 mg/dL, BUN: 55 mg/dL, creatinine: 1.32 mg/dL, Ca: 7.2 mg/dL. IVF: NS @ 20 mL/hr.     11/29 - Pt appetite poor at present, ate bites of donut at breakfast this AM.  - Recorded po intake 25% on average - She typically eats 3 meals per day.  - Breakfast is typicailly cereal with coffee and juice, lunch is pimento cheese sandwich, hamburger, or hot dog and dinner is something like hamburger or steak with baked potato and salad.  - Pt sometimes drinks Boost at home - Pt reports UBW around 98 pounds,  current wt of 100 pounds. - Pt at risk for malnutrition.  - Noted some depletions on exam but older age and limited mobility may be contributing.  - Weight has been stable recently and po intake per report has been good at home.      Diet Order:  Diet NPO time specified Diet regular Room service appropriate? Yes; Fluid consistency: Thin  EDUCATION NEEDS:   Not appropriate for education at this time  Skin:  Skin Assessment: Skin Integrity Issues: Skin Integrity Issues:: Stage I Stage I: Sacrum  Last BM:  11/28  Height:   Ht Readings from Last 1 Encounters:  10/11/17 5\' 3"  (1.6 m)    Weight:   Wt Readings from Last 1 Encounters:  10/16/17 108 lb 14.5 oz (49.4 kg)    Ideal Body Weight:  52.27 kg  BMI:  Body mass index is 19.29 kg/m.  Estimated Nutritional Needs:   Kcal:  1350-1550 kcals  Protein:  65-75 g  Fluid:  >/ = 1.4 L      Jarome Matin, MS, RD, LDN, [facility] Inpatient Clinical Dietitian Pager # [phone] After hours/weekend pager # [phone]

## 2017-10-16 NOTE — Progress Notes (Signed)
Patient extubated per MD's orders placed on 3LNC. SATS 100%. No stridor heard, RR 26, HR 91, will continue to monitor patient.

## 2017-10-16 NOTE — Progress Notes (Addendum)
Tried on Bipap.  She feels like she is smothering.  No difference with adjustment in pressure settings.  Will d/c Bipap and try her on high flow oxygen.  Additional CC time 10 minutes.  Chesley Mires, MD [facility] Pulmonary/Critical Care 10/16/2017, 4:47 PM Pager:  [phone] After 3pm call: [phone]

## 2017-10-17 DIAGNOSIS — J9589 Other postprocedural complications and disorders of respiratory system, not elsewhere classified: Secondary | ICD-10-CM

## 2017-10-17 DIAGNOSIS — J9601 Acute respiratory failure with hypoxia: Secondary | ICD-10-CM

## 2017-10-17 LAB — BASIC METABOLIC PANEL
Anion gap: 9 (ref 5–15)
BUN: 45 mg/dL — AB (ref 6–20)
CALCIUM: 6.8 mg/dL — AB (ref 8.9–10.3)
CHLORIDE: 112 mmol/L — AB (ref 101–111)
CO2: 18 mmol/L — ABNORMAL LOW (ref 22–32)
CREATININE: 1.19 mg/dL — AB (ref 0.44–1.00)
GFR calc non Af Amer: 41 mL/min — ABNORMAL LOW (ref >60)
GFR, EST AFRICAN AMERICAN: 47 mL/min — AB (ref >60)
Glucose, Bld: 137 mg/dL — ABNORMAL HIGH (ref 65–99)
Potassium: 3.9 mmol/L (ref 3.5–5.1)
SODIUM: 139 mmol/L (ref 135–145)

## 2017-10-17 LAB — CBC
HCT: 27.2 % — ABNORMAL LOW (ref 36.0–46.0)
Hemoglobin: 8.5 g/dL — ABNORMAL LOW (ref 12.0–15.0)
MCH: 28.2 pg (ref 26.0–34.0)
MCHC: 31.3 g/dL (ref 30.0–36.0)
MCV: 90.4 fL (ref 78.0–100.0)
PLATELETS: 171 10*3/uL (ref 150–400)
RBC: 3.01 MIL/uL — AB (ref 3.87–5.11)
RDW: 19.6 % — AB (ref 11.5–15.5)
WBC: 2.8 10*3/uL — AB (ref 4.0–10.5)

## 2017-10-17 LAB — MAGNESIUM: MAGNESIUM: 2 mg/dL (ref 1.7–2.4)

## 2017-10-17 LAB — GLUCOSE, CAPILLARY
GLUCOSE-CAPILLARY: 142 mg/dL — AB (ref 65–99)
GLUCOSE-CAPILLARY: 132 mg/dL — AB (ref 65–99)

## 2017-10-17 LAB — TRIGLYCERIDES: Triglycerides: 67 mg/dL (ref <150)

## 2017-10-17 MED ORDER — AMIODARONE LOAD VIA INFUSION
150.0000 mg | Freq: Once | INTRAVENOUS | Status: AC
  Administered 2017-10-17: 150 mg via INTRAVENOUS
  Filled 2017-10-17: qty 83.34

## 2017-10-17 MED ORDER — AMIODARONE IV BOLUS ONLY 150 MG/100ML: 150.0000 mg | Freq: Once | INTRAVENOUS | Status: DC

## 2017-10-17 MED ORDER — AMIODARONE HCL IN DEXTROSE 360-4.14 MG/200ML-% IV SOLN
INTRAVENOUS | Status: AC
  Filled 2017-10-17: qty 200

## 2017-10-17 MED ORDER — AMIODARONE HCL IN DEXTROSE 360-4.14 MG/200ML-% IV SOLN
60.0000 mg/h | INTRAVENOUS | Status: AC
  Administered 2017-10-17 (×2): 60 mg/h via INTRAVENOUS
  Filled 2017-10-17: qty 200

## 2017-10-17 MED ORDER — AMIODARONE HCL IN DEXTROSE 360-4.14 MG/200ML-% IV SOLN
30.0000 mg/h | INTRAVENOUS | Status: DC
  Administered 2017-10-17 – 2017-10-18 (×2): 30 mg/h via INTRAVENOUS
  Filled 2017-10-17 (×2): qty 200

## 2017-10-17 MED ORDER — LACTATED RINGERS IV SOLN
INTRAVENOUS | Status: DC
  Administered 2017-10-17 – 2017-10-19 (×3): via INTRAVENOUS

## 2017-10-17 MED ORDER — PANTOPRAZOLE SODIUM 40 MG PO TBEC: 40.0000 mg | DELAYED_RELEASE_TABLET | Freq: Every day | ORAL | Status: DC

## 2017-10-17 MED ORDER — PANTOPRAZOLE SODIUM 40 MG IV SOLR
40.0000 mg | Freq: Every day | INTRAVENOUS | Status: DC
  Administered 2017-10-18: 40 mg via INTRAVENOUS
  Filled 2017-10-17 (×2): qty 40

## 2017-10-17 NOTE — Progress Notes (Signed)
Pt had episode of Afib w/ RVR with HR: 130's to 170's. Per MD orders I gave a 150 mg bolus of Amiodarone and restarted the drip. Will continue to monitor.

## 2017-10-17 NOTE — Progress Notes (Signed)
[name] Progress Note Patient Name: Judy Herrera DOB: 1931-07-30 MRN: [id number]   Date of Service  10/17/2017  HPI/Events of Note  AFIB with RVR - Ventricular rate = 130 - 165.  eICU Interventions  Will order: 1. Restart Amiodarone IV infusion. 2. Bolus with Amiodarone 150 mg IV over 10 minutes now.      Intervention Category Major Interventions: Arrhythmia - evaluation and management  Aibhlinn Kalmar Eugene 10/17/2017, 4:27 AM

## 2017-10-17 NOTE — Progress Notes (Signed)
PULMONARY / CRITICAL CARE MEDICINE   Name: Judy Herrera MRN: [id number] DOB: [birth year]    ADMISSION DATE:  10/11/2017 CONSULTATION DATE:  10/15/17  REFERRING MD:  Dr. Ree Kida, [facility]  CHIEF COMPLAINT:  Respiratory failure  HISTORY OF PRESENT ILLNESS:   83 yo female presented with back and hip pain from acute sacral insufficiency fracture, nondisplaced Rt L5 transverse process fracture.  She was also found to have tachycardia with PSVT.  She developed progressive respiratory distress and required intubation 11/30.  PMHx of HTN, CAD, HLD, Endocrine porocarcinoma of Lt arm, Breast cancer, ovarian Cancer  SUBJECTIVE:  Breathing better.  Still has cough, but less.  Denies chest pain.  Went into A fib with RVR overnight >> improved after restarting amiodarone.  VITAL SIGNS: BP (!) 128/58   Pulse 77   Temp 98.4 F (36.9 C) (Axillary)   Resp 17   Ht 5\' 3"  (1.6 m)   Wt 108 lb 14.5 oz (49.4 kg)   SpO2 100%   BMI 19.29 kg/m   VENTILATOR SETTINGS: FiO2 (%):  [32 %-52 %] 52 %  INTAKE / OUTPUT: I/O last 3 completed shifts: In: 3413.4 [I.V.:3373.4; NG/GT:40] Out: 2835 [Urine:2585; Emesis/NG output:250]  PHYSICAL EXAMINATION: . General - pleasant Eyes - pupils reactive ENT - improved stridor Cardiac - irregular, no murmur Chest - no wheeze, rales Abd - soft, non tender Ext - no edema Skin - no rashes Neuro - follows commands   LABS:  BMET Recent Labs  Lab 10/15/17 0516 10/16/17 0411 10/17/17 0440  NA 132* 137 139  K 4.5 4.2 3.9  CL 112* 109 112*  CO2 15* 19* 18*  BUN 67* 55* 45*  CREATININE 1.49* 1.32* 1.19*  GLUCOSE 97 99 137*    Electrolytes Recent Labs  Lab 10/11/17 0956  10/15/17 0516 10/15/17 1950 10/16/17 0411 10/17/17 0440  CALCIUM 8.1*   < > 7.0*  --  7.2* 6.8*  MG 2.0  --   --  1.5*  --  2.0   < > = values in this interval not displayed.    CBC Recent Labs  Lab 10/15/17 0516 10/16/17 0411 10/17/17 0440  WBC 3.2* 3.3* 2.8*  HGB  8.9* 8.5* 8.5*  HCT 27.5* 26.7* 27.2*  PLT 213 157 171    Coag's Recent Labs  Lab 10/12/17 0143  INR 1.39    Sepsis Markers No results for input(s): LATICACIDVEN, PROCALCITON, O2SATVEN in the last 168 hours.  ABG Recent Labs  Lab 10/15/17 1654 10/15/17 1724 10/16/17 0330  PHART 7.099* 7.297* 7.435  PCO2ART 62.1* 34.5 28.1*  PO2ART 300* 483* 134*    Liver Enzymes Recent Labs  Lab 10/12/17 0143 10/15/17 1950  AST 28 18  ALT 24 13*  ALKPHOS 56 39  BILITOT 2.1* 1.5*  ALBUMIN 2.7* 1.4*    Cardiac Enzymes Recent Labs  Lab 10/11/17 1857 10/11/17 2140 10/12/17 0143  TROPONINI 1.66* 1.56* 1.45*    Glucose Recent Labs  Lab 10/15/17 2356 10/16/17 0327 10/16/17 0818 10/16/17 1152 10/16/17 1633 10/16/17 2141  GLUCAP 101* 109* 101* 108* 112* 124*    Imaging No results found.   STUDIES:  CT lumbar spine 11/26 >> acute sacral insufficiency fx, non displaced rt L5 transverse process fx Echo 11/27 >> EF 40 to 45%, mod LVH, mod MR, PAS 34 mmHg V/Q scan 11/28 >> suggestive of obstructive lung disease Renal u/s 11/29 >> b/l renal atrophy  SIGNIFICANT EVENTS: 11/30 to ICU 12/01 Extubated >> stridor, didn't tolerate Bipap, added  solumedrol 12/02 A fib with RVR >> restart amiodarone  LINES/TUBES: ETT 11/30 >> 12/01  ASSESSMENT / PLAN:  Acute hypoxic/hypercapnic respiratory failure in setting of acute systolic/diastolic CHF and obstructive lung disease. Post extubation stridor. - continue solumedrol - didn't tolerate Bipap >> continue high flow oxygen - continue BDs - f/u CXR  Acute on chronic systolic CHF. Tachycardia with PSVT. Mitral regurgitation. - continue amiodarone  Cancer related cachexia. - speech to assess swallowing  Hypertriglyceridemia >> 2262 from 11/30. - d/c diprivan - level 67 from 12/02 >> resolved  Sacral fracture with hx of osteoporosis. Rt L5 transverse process fx. - pain meds - PT/OT  DVT prophylaxis - SQ  heparin SUP - protonix Nutrition - NPO pending speech therapy assessment Goals of care - full code  Keep in ICU today >> if stable, then likely can transfer to SDU 12/03.  Chesley Mires, MD [facility] Pulmonary/Critical Care 10/17/2017, 8:07 AM Pager:  [phone] After 3pm call: [phone]

## 2017-10-17 NOTE — Evaluation (Signed)
Clinical/Bedside Swallow Evaluation Patient Details  Name: Judy Herrera MRN: [id number] Date of Birth: [birth year]  Today's Date: 10/17/2017 Time: SLP Start Time (ACUTE ONLY): 6270 SLP Stop Time (ACUTE ONLY): 0948 SLP Time Calculation (min) (ACUTE ONLY): 20 min  Past Medical History:  Past Medical History:  Diagnosis Date  . Allergy   . Arthritis   . Breast CA ([place])    NO BP OR STICKS IN LEFT ARM  . CAD (coronary artery disease)   . Cancer ([place])   . Cardiomyopathy ([place])    improved  . Cataract   . Diverticulosis   . Eccrine porocarcinoma of skin   . History of chemotherapy   . History of radiation therapy 1994   left chest wall  . Hyperlipidemia   . Hypertension   . Iron malabsorption 09/03/2017  . Osteoporosis   . Ovarian ca ([place])   . Sacral fracture ([place]) 09/2017  . Wears dentures    full bottom-partial top  . Wears glasses    Past Surgical History:  Past Surgical History:  Procedure Laterality Date  . ABDOMINAL HYSTERECTOMY  2004  . BREAST SURGERY    . CHOLECYSTECTOMY  1993  . EYE SURGERY     CATARACTS  . left breast mastectomy  1994   T3 N1 Mx  . MASS EXCISION Left 05/09/2014   Procedure: EXCISION OF NODULE LEFT CHEST ;  Surgeon: Pedro Earls, MD;  Location: [place];  Service: General;  Laterality: Left;  [place] Kitchen MODIFIED RADICAL MASTECTOMY W/ AXILLARY LYMPH NODE DISSECTION W/ PECTORALIS MINOR EXCISION  1994   Left  . right leg  1990   fx  . SHOULDER ARTHROSCOPY    . [place]   replacement-lt  . SKIN BIOPSY  04/27/2011   left hand, forearm and posterior arm   HPI:  83 y.o. female who presents with intractable back pain from fracture on 10/12/27 and new onset Afib w/ likely intermittent RVR; BSE completed on 10/13/17 indicating regular/thin diet recommendation with review of MBS from 2016 utilized for dysphagia hx and pt familiar with ST; pt intubated on 10/15/17 and extubated 10/16/17 with noted residual stridor and hx of  GERD, vf paralysis and dysphagia.  BSE re-ordered to assess swallowing function post-extubation; pt currently on high-flow [place] d/t not tolerating Bipap for respiratory purposes.  Assessment / Plan / Recommendation Clinical Impression   Pt with oropharyngeal dysphagia with overt s/s of aspiration including delayed and/or immediate cough with various consistencies including ice chips, thin liquids, nectar and honey-thickened liquids; puree appeared WFL, but MBS needed to determine if aspiration occurring at present and what diet is safest for consumption; goals to be determined after objective testing completed. Radiology not able to complete this date and d/t recent extubation and potential edema, decreased vocal quality and hx of dysphagia/GERD, 10/18/17 may be better to determine swallowing safety/efficiency; discussed with pt and family and they are in agreement.  SLP Visit Diagnosis: Dysphagia, pharyngeal phase (R13.13)    Aspiration Risk  Moderate aspiration risk    Diet Recommendation   NPO  Medication Administration: Via alternative means    Other  Recommendations Oral Care Recommendations: Oral care QID   Follow up Recommendations Other (comment)(TBD)      Frequency and Duration min 2x/week  1 week       Prognosis Prognosis for Safe Diet Advancement: Fair      Swallow Study   General Date of Onset: 10/16/17 HPI: 83 y.o. female who  presents with intractable back pain from fracture and new onset Afib w/ likely intermittent RVR. Elevated tropninin noted and Type of Study: Bedside Swallow Evaluation Previous Swallow Assessment: 10/13/17; regular/thin recommended Diet Prior to this Study: NPO Temperature Spikes Noted: No Respiratory Status: Nasal cannula(HF) History of Recent Intubation: Yes Length of Intubations (days): 1 days Date extubated: 10/16/17 Behavior/Cognition: Alert;Cooperative Oral Cavity Assessment: Within Functional Limits Oral Care Completed by SLP: Yes Oral  Cavity - Dentition: Poor condition;Missing dentition Vision: Functional for self-feeding Self-Feeding Abilities: Able to feed self Patient Positioning: Upright in bed Baseline Vocal Quality: Breathy;Other (comment)(stridor noted; baseline vf paralysis) Volitional Cough: Weak Volitional Swallow: Able to elicit    Oral/Motor/Sensory Function Overall Oral Motor/Sensory Function: Within functional limits   Ice Chips Ice chips: Impaired Presentation: Spoon Pharyngeal Phase Impairments: Cough - Delayed   Thin Liquid Thin Liquid: Impaired Presentation: Spoon Pharyngeal  Phase Impairments: Cough - Immediate    Nectar Thick Nectar Thick Liquid: Impaired Presentation: Spoon Pharyngeal Phase Impairments: Cough - Delayed   Honey Thick Honey Thick Liquid: Impaired Presentation: Spoon Pharyngeal Phase Impairments: Cough - Delayed   Puree Puree: Within functional limits Presentation: Spoon Other Comments: (,5 tsp)   Solid      Solid: Not tested        Elvina Sidle, M.S., CCC-SLP 10/17/2017,1:05 PM

## 2017-10-18 ENCOUNTER — Inpatient Hospital Stay (HOSPITAL_COMMUNITY): Payer: Medicare HMO

## 2017-10-18 DIAGNOSIS — I509 Heart failure, unspecified: Secondary | ICD-10-CM

## 2017-10-18 DIAGNOSIS — I5022 Chronic systolic (congestive) heart failure: Secondary | ICD-10-CM

## 2017-10-18 LAB — GLUCOSE, CAPILLARY
GLUCOSE-CAPILLARY: 139 mg/dL — AB (ref 65–99)
GLUCOSE-CAPILLARY: 126 mg/dL — AB (ref 65–99)
GLUCOSE-CAPILLARY: 140 mg/dL — AB (ref 65–99)
GLUCOSE-CAPILLARY: 148 mg/dL — AB (ref 65–99)
Glucose-Capillary: 129 mg/dL — ABNORMAL HIGH (ref 65–99)
Glucose-Capillary: 192 mg/dL — ABNORMAL HIGH (ref 65–99)

## 2017-10-18 LAB — CBC
HCT: 27.7 % — ABNORMAL LOW (ref 36.0–46.0)
HEMOGLOBIN: 8.9 g/dL — AB (ref 12.0–15.0)
MCH: 29 pg (ref 26.0–34.0)
MCHC: 32.1 g/dL (ref 30.0–36.0)
MCV: 90.2 fL (ref 78.0–100.0)
PLATELETS: 208 10*3/uL (ref 150–400)
RBC: 3.07 MIL/uL — AB (ref 3.87–5.11)
RDW: 20.5 % — ABNORMAL HIGH (ref 11.5–15.5)
WBC: 3.7 10*3/uL — ABNORMAL LOW (ref 4.0–10.5)

## 2017-10-18 LAB — BLOOD GAS, ARTERIAL
Acid-base deficit: 9.8 mmol/L — ABNORMAL HIGH (ref 0.0–2.0)
Bicarbonate: 18.4 mmol/L — ABNORMAL LOW (ref 20.0–28.0)
Drawn by: 129711
FIO2: 100
Mode: POSITIVE
O2 Saturation: 99.2 %
PEEP: 5 cmH2O
PIP: 10 cmH2O
Patient temperature: 98.6
RATE: 15 resp/min
pCO2 arterial: 62.1 mmHg — ABNORMAL HIGH (ref 32.0–48.0)
pH, Arterial: 7.099 — CL (ref 7.350–7.450)
pO2, Arterial: 300 mmHg — ABNORMAL HIGH (ref 83.0–108.0)

## 2017-10-18 LAB — BASIC METABOLIC PANEL
Anion gap: 10 (ref 5–15)
BUN: 47 mg/dL — ABNORMAL HIGH (ref 6–20)
CHLORIDE: 113 mmol/L — AB (ref 101–111)
CO2: 19 mmol/L — AB (ref 22–32)
CREATININE: 1.19 mg/dL — AB (ref 0.44–1.00)
Calcium: 6.9 mg/dL — ABNORMAL LOW (ref 8.9–10.3)
GFR calc non Af Amer: 41 mL/min — ABNORMAL LOW (ref >60)
GFR, EST AFRICAN AMERICAN: 47 mL/min — AB (ref >60)
GLUCOSE: 158 mg/dL — AB (ref 65–99)
Potassium: 3.9 mmol/L (ref 3.5–5.1)
Sodium: 142 mmol/L (ref 135–145)

## 2017-10-18 MED ORDER — DM-GUAIFENESIN ER 30-600 MG PO TB12
1.0000 | ORAL_TABLET | Freq: Two times a day (BID) | ORAL | Status: DC
  Administered 2017-10-18 – 2017-10-21 (×7): 1 via ORAL
  Filled 2017-10-18 (×9): qty 1

## 2017-10-18 MED ORDER — LISINOPRIL 5 MG PO TABS
2.5000 mg | ORAL_TABLET | Freq: Every day | ORAL | Status: DC
  Administered 2017-10-18 – 2017-10-21 (×4): 2.5 mg via ORAL
  Filled 2017-10-18 (×5): qty 1

## 2017-10-18 MED ORDER — AMIODARONE HCL 200 MG PO TABS
200.0000 mg | ORAL_TABLET | Freq: Two times a day (BID) | ORAL | Status: DC
  Administered 2017-10-18 – 2017-10-21 (×7): 200 mg via ORAL
  Filled 2017-10-18 (×8): qty 1

## 2017-10-18 MED ORDER — ATORVASTATIN CALCIUM 20 MG PO TABS
20.0000 mg | ORAL_TABLET | Freq: Every day | ORAL | Status: DC
  Administered 2017-10-19 – 2017-10-20 (×2): 20 mg via ORAL
  Filled 2017-10-18 (×2): qty 1

## 2017-10-18 MED ORDER — ORAL CARE MOUTH RINSE
15.0000 mL | Freq: Two times a day (BID) | OROMUCOSAL | Status: DC
  Administered 2017-10-18 – 2017-10-21 (×7): 15 mL via OROMUCOSAL

## 2017-10-18 MED ORDER — FUROSEMIDE 20 MG PO TABS
10.0000 mg | ORAL_TABLET | Freq: Every day | ORAL | Status: DC
  Administered 2017-10-18 – 2017-10-21 (×4): 10 mg via ORAL
  Filled 2017-10-18 (×5): qty 1

## 2017-10-18 MED ORDER — METHYLPREDNISOLONE SODIUM SUCC 40 MG IJ SOLR
40.0000 mg | Freq: Two times a day (BID) | INTRAMUSCULAR | Status: DC
  Administered 2017-10-18 – 2017-10-21 (×6): 40 mg via INTRAVENOUS
  Filled 2017-10-18 (×6): qty 1

## 2017-10-18 NOTE — Progress Notes (Signed)
Pt transferred to 5W27. Pt made comfortable and given supper. [place] within reach. Will continue to assess.

## 2017-10-18 NOTE — Clinical Social Work Note (Signed)
Clinical Social Worker continuing to follow patient and family for support and discharge planning needs.  Patient plans to discharge to [facility] once medically stable.  CSW to verify that insurance authorization remains valid and will facilitate patient discharge plans once medically ready.  CSW available for support as needed.  Judy Herrera, [place]

## 2017-10-18 NOTE — Progress Notes (Signed)
PCCM Interval Note  Called to patient bedside for urinary retention. Urinary Catheter d/c at 0900 on 12/2 and patient has had no urinary output. Bladder scan with >400. Bedside staff have attempted I&O 5x with no success.   I was able to re-place foley catheter. Will maintain for urinary retention.   Hayden Pedro, AGACNP-BC [place] [facility] & Critical [facility]  Pgr: [phone]  PCCM Pgr: [phone]

## 2017-10-18 NOTE — Progress Notes (Signed)
PULMONARY / CRITICAL CARE MEDICINE   Name: Judy Herrera MRN: [id number] DOB: Mar 17, 1931    ADMISSION DATE:  10/11/2017 CONSULTATION DATE:  10/15/17  REFERRING MD:  Dr. Ree Kida, [facility]  CHIEF COMPLAINT:  Respiratory failure  HISTORY OF PRESENT ILLNESS:   83 yo female presented with back and hip pain from acute sacral insufficiency fracture, nondisplaced Rt L5 transverse process fracture.  She was also found to have tachycardia with PSVT.  She developed progressive respiratory distress and required intubation 11/30.  PMHx of HTN, CAD, HLD, Endocrine porocarcinoma of Lt arm, Breast cancer, ovarian Cancer  SUBJECTIVE:  Breathing better.  No complaint of dyspnea but she does have a cough and she is unable to clear her viscous secretions.  Pain control is adequate.  She is maintaining sinus rhythm on amiodarone infusion.  She is currently n.p.o. pending a formal swallow evaluation.  VITAL SIGNS: BP (!) 147/68   Pulse 84   Temp 97.8 F (36.6 C) (Oral)   Resp (!) 24   Ht 5\' 3"  (1.6 m)   Wt 105 lb 6.1 oz (47.8 kg)   SpO2 100%   BMI 18.67 kg/m   VENTILATOR SETTINGS: FiO2 (%):  [52 %] 52 %  INTAKE / OUTPUT: I/O last 3 completed shifts: In: 1987.3 [I.V.:1987.3] Out: 1075 [FGHWE:9937]  PHYSICAL EXAMINATION: . General -she is pleasantly interactive and in no distress.  ENT - no stridor Cardiac -S1 and S2 are distant and regular without murmur rub or gallop.  There is 1+ symmetric lower extremity edema Chest -respirations are unlabored, there is symmetric air movement, no wheezes. Abd - soft, non tender there is no organomegaly masses guarding or rebound.    LABS:  BMET Recent Labs  Lab 10/16/17 0411 10/17/17 0440 10/18/17 0257  NA 137 139 142  K 4.2 3.9 3.9  CL 109 112* 113*  CO2 19* 18* 19*  BUN 55* 45* 47*  CREATININE 1.32* 1.19* 1.19*  GLUCOSE 99 137* 158*    Electrolytes Recent Labs  Lab 10/11/17 0956  10/15/17 1950 10/16/17 0411 10/17/17 0440  10/18/17 0257  CALCIUM 8.1*   < >  --  7.2* 6.8* 6.9*  MG 2.0  --  1.5*  --  2.0  --    < > = values in this interval not displayed.    CBC Recent Labs  Lab 10/16/17 0411 10/17/17 0440 10/18/17 0257  WBC 3.3* 2.8* 3.7*  HGB 8.5* 8.5* 8.9*  HCT 26.7* 27.2* 27.7*  PLT 157 171 208    Coag's Recent Labs  Lab 10/12/17 0143  INR 1.39    Sepsis Markers No results for input(s): LATICACIDVEN, PROCALCITON, O2SATVEN in the last 168 hours.  ABG Recent Labs  Lab 10/15/17 1654 10/15/17 1724 10/16/17 0330  PHART 7.099* 7.297* 7.435  PCO2ART 62.1* 34.5 28.1*  PO2ART 300* 483* 134*    Liver Enzymes Recent Labs  Lab 10/12/17 0143 10/15/17 1950  AST 28 18  ALT 24 13*  ALKPHOS 56 39  BILITOT 2.1* 1.5*  ALBUMIN 2.7* 1.4*    Cardiac Enzymes Recent Labs  Lab 10/11/17 1857 10/11/17 2140 10/12/17 0143  TROPONINI 1.66* 1.56* 1.45*    Glucose Recent Labs  Lab 10/16/17 2141 10/17/17 0745 10/17/17 1151 10/17/17 1725 10/17/17 2127 10/18/17 0742  GLUCAP 124* 139* 142* 132* 126* 140*    Imaging Dg Chest Port 1 View  Result Date: 10/18/2017 CLINICAL DATA:  Respiratory distress. EXAM: PORTABLE CHEST 1 VIEW COMPARISON:  Radiograph October 16, 2017. FINDINGS: Stable  cardiomediastinal silhouette pulmonary vascular congestion. No pneumothorax is noted. Endotracheal and nasogastric tubes have been removed. Status post left shoulder arthroplasty. Stable left apical scarring. Hyperinflation of the lungs is noted consistent with chronic obstructive pulmonary disease. Atherosclerosis of the thoracic aorta is noted. Stable right basilar atelectasis and effusion is noted. IMPRESSION: Stable right basilar atelectasis and effusion. Aortic atherosclerosis. Hyperinflation of the lungs consistent with chronic obstructive pulmonary disease. Electronically Signed   By: Marijo Conception, M.D.   On: 10/18/2017 07:26     STUDIES:  CT lumbar spine 11/26 >> acute sacral insufficiency fx, non  displaced rt L5 transverse process fx Echo 11/27 >> EF 40 to 45%, mod LVH, mod MR, PAS 34 mmHg V/Q scan 11/28 >> suggestive of obstructive lung disease Renal u/s 11/29 >> b/l renal atrophy  SIGNIFICANT EVENTS: 11/30 to ICU 12/01 Extubated >> stridor, didn't tolerate Bipap, added solumedrol 12/02 A fib with RVR >> restart amiodarone  LINES/TUBES: ETT 11/30 >> 12/01  ASSESSMENT / PLAN:  Acute hypoxic/hypercapnic respiratory failure in setting of acute systolic/diastolic CHF and obstructive lung disease. Post extubation stridor resolved. - Taper solumedrol - didn't tolerate Bipap - continue BDs - add mucinex to facilitate clearance of mucous  Acute on chronic systolic CHF. Last Echo was 10/12/2017 showing ef 40 to 03% with diastolic dysfunction as well. She is 9 liters up from admission. I have added  small doses of lasix and lisinopril which can be titrated as tolerated. Tachycardia with PSVT.  Mitral regurgitation. - continue amiodarone which has been switched to po today.  Cancer related cachexia. - swallow study today.   Sacral fracture with hx of osteoporosis. Rt L5 transverse process fx. - pain meds - PT/OT  DVT prophylaxis - SQ heparin SUP - protonix Nutrition - NPO pending speech therapy assessment Goals of care - full code  Stable for transfer to SDU, CCM will not routinely follow post transfer, please consult [place] as needed  Lars Masson, MD [place] 10/18/2017, 8:28 AM Pager:  [phone] After 3pm call: [phone]

## 2017-10-18 NOTE — Progress Notes (Signed)
Modified Barium Swallow Progress Note  Patient Details  Name: Maleya Leever MRN: [id number] Date of Birth: 06-05-1931  Today's Date: 10/18/2017  Modified Barium Swallow completed.  Full report located under Chart Review in the Imaging Section.  Brief recommendations include the following:  Clinical Impression  Pt demonstrates mild oral and oropharyngeal dysphagia secondary to primary respiratory deconditioning. Pt is visibly short of breath with decreased ability to sustain apneic period during swallow. Depsite this, timing is adequate and airway protection is sufficient without any instances of aspiration or penetration. Pt did have frequent coughing post swallow that may have been related to appearance of slow, but complete transit of bolus through the esophageal column (no radiologist present to confirm). Mild oral residuals and base of tongue residuals pool in vallecule post swallow but are consitently cleared with a second swallow without cues needed. Pt would be at increased risk of aspiration with any decline in respiratory function and tolerance of diet will likely depend on respiratory endurance with meals. Pt may need additional training in compensatory strategies for pacing with meal. Will follow for assessment at bedside during a meal. Will initiate regular texture and thin liquids.    Swallow Evaluation Recommendations       SLP Diet Recommendations: Regular solids;Thin liquid   Liquid Administration via: Cup;Straw   Medication Administration: Whole meds with liquid   Supervision: Patient able to self feed;Intermittent supervision to cue for compensatory strategies   Compensations: Slow rate;Small sips/bites;Minimize environmental distractions   Postural Changes: Seated upright at 90 degrees;Remain semi-upright after after feeds/meals (Comment)   Oral Care Recommendations: Oral care BID       Herbie [place], MA CCC-SLP [phone]  Aasim Restivo, Katherene Ponto 10/18/2017,10:32 AM

## 2017-10-18 NOTE — Plan of Care (Signed)
Will continue to take preventative measures to prevent infection.

## 2017-10-19 LAB — BASIC METABOLIC PANEL
Anion gap: 10 (ref 5–15)
BUN: 42 mg/dL — AB (ref 6–20)
CHLORIDE: 107 mmol/L (ref 101–111)
CO2: 22 mmol/L (ref 22–32)
Calcium: 6.8 mg/dL — ABNORMAL LOW (ref 8.9–10.3)
Creatinine, Ser: 1.03 mg/dL — ABNORMAL HIGH (ref 0.44–1.00)
GFR calc Af Amer: 56 mL/min — ABNORMAL LOW (ref >60)
GFR calc non Af Amer: 49 mL/min — ABNORMAL LOW (ref >60)
Glucose, Bld: 138 mg/dL — ABNORMAL HIGH (ref 65–99)
POTASSIUM: 3.6 mmol/L (ref 3.5–5.1)
SODIUM: 139 mmol/L (ref 135–145)

## 2017-10-19 LAB — GLUCOSE, CAPILLARY
GLUCOSE-CAPILLARY: 126 mg/dL — AB (ref 65–99)
GLUCOSE-CAPILLARY: 131 mg/dL — AB (ref 65–99)
GLUCOSE-CAPILLARY: 147 mg/dL — AB (ref 65–99)
Glucose-Capillary: 168 mg/dL — ABNORMAL HIGH (ref 65–99)

## 2017-10-19 MED ORDER — PANTOPRAZOLE SODIUM 40 MG PO TBEC
40.0000 mg | DELAYED_RELEASE_TABLET | Freq: Every day | ORAL | Status: DC
  Administered 2017-10-19 – 2017-10-21 (×3): 40 mg via ORAL
  Filled 2017-10-19 (×3): qty 1

## 2017-10-19 NOTE — Progress Notes (Signed)
PROGRESS NOTE    Judy Herrera  LPF:[id number] DOB: 07/05/1931 DOA: 10/11/2017 PCP: Marletta Lor, MD   Chief Complaint  Patient presents with  . Back Pain  . Tachycardia    Brief Narrative:  HPI on 10/11/2017 by Dr. 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 X-rays and overall exam was unremarkable, she was ambulatory and discharged home. However, this morning she woke up unable to get out of bed without assistance. She had, much more severe lower back pain.She denies any numbness or tingling, she denies any constipation or difficulty with urination or incontinence. The patient denies any saddle anesthesia. She denies any rest pain or palpitations. NO changes in meds or herbal productsShe denies any shortness of breath cough. Of note, in route, EMS noted that the patient was very tachycardic, with heart rate between 110-120s. The EMS strips suggested possible atrial fibrillation, but she was essentially asymptomatic. She denies any syncope or presyncope, chest pain or palpitations. Denies shortness of breathThis is new to her, denies any history of atrial fibrillation.  Interim history Patient had respiratory failure with hypoxia and hypercapnia, with age fibrillation and RVR, required intubation. Assessment & Plan   Acute respiratory failure with hypoxia and hypercapnia -In the setting of acute systolic/diastolic heart failure,  atrial fibrillation, obstructive lung disease -PCCM called for intubation -Patient successfully extubated -Continue to taper Solu-Medrol, bronchodilators, Mucinex -Attempt to wean nasal cannula if possible  Acute lower back pain with sacral fracture/history of osteoporosis, right L5 vertebral fracture -CT of the lumbar spine showed L5 transverse fracture and sacral insufficiency fracture -Patient received fentanyl, hydrocodone, IV fluids without synechia relief and was admitted -PT, OT consulted recommended SNF -Continue vitamin D and calcium supplementation -Continue pain control with bowel regimen  Atrial fibrillation with RVR/MAT/NSVT/elevated troponin -patient developed AF with respiratory failure -Cardiology consulted and appreciated -required intubation, patient received adenosine, rhythm then showed atrial flutter, and had electrical cardioversion as she remained unstable -patient currently in sinus rhythm  -Cardiology had spoken with the family on 11/30: Discussed long-term management options, possibility of false electrophysiology services and radio frequency ablation. Issue surrounding patient's age and comorbid conditions suggest a more conservative approach is appropriate at this time. -Placed on oral amiodarone, Coreg  Acute on Chronic systolic CHF -Echocardiogram 10/12/2017 showed an EF of 53-29% with diastolic dysfunction. -Placed on Lasix, lisinopril -Continue to monitor intake and output, daily weights  Anemia of chronic disease -Hemoglobin appears to be stable, continue to monitor CBC  Acute kidney injury -Likely due to tachycardia with low perfusion -Creatinine currently 1.03 -Will discontinue lactated Ringer's, continue to monitor BMP  Breast cancer/endocrine pleural carcinoma skin -Patient follows with Dr. Marin Olp for Stage IIIa breast cancer -Received palliative radiation of LUE  DVT Prophylaxis  Heparin  Code Status: Full  Family Communication:  None at bedside  Disposition Plan: Admitted. Needs SNF  Consultants Cardiology  Procedures  Echocardiogram  Antibiotics   Anti-infectives (From admission, onward)   None      Subjective:   Judy Herrera  seen and examined today.  Has no complaints today. Denies chest pain, shortness of breath, abdominal pain, nausea, vomiting, diarrhea, constipation. Has poor appetite.  Objective:   Vitals:   10/19/17 0816 10/19/17 0923 10/19/17 1321 10/19/17 1426  BP:  138/63 132/68   Pulse:  86 76   Resp:   18   Temp:   (!) 97.3 F (36.3 C)   TempSrc:   Oral   SpO2: 96%  100% 99%  Weight:      Height:        Intake/Output Summary (Last 24 hours) at 10/19/2017 1441 Last data filed at 10/19/2017 1416 Gross per 24 hour  Intake 168 ml  Output 850 ml  Net -682 ml   Filed Weights   10/16/17 0500 10/18/17 0500 10/19/17 0521  Weight: 49.4 kg (108 lb 14.5 oz) 47.8 kg (105 lb 6.1 oz) 52.7 kg (116 lb 2.9 oz)   Exam  General: Well developed, well nourished, NAD, appears stated age  HEENT: NCAT, mucous membranes moist.   Cardiovascular: S1 S2 auscultated, no rubs, murmurs or gallops. Regular rate and rhythm.  Respiratory: Clear to auscultation bilaterally with equal chest rise  Abdomen: Soft, nontender, nondistended, + bowel sounds  Extremities: warm dry without cyanosis clubbing. Trace LE edema  Neuro: AAOx3, nonfocal  Psych: Normal affect and demeanor, pleasant  Data Reviewed: I have personally reviewed following labs and imaging studies  CBC: Recent Labs  Lab 10/14/17 0732 10/15/17 0516 10/16/17 0411 10/17/17 0440 10/18/17 0257  WBC 2.9* 3.2* 3.3* 2.8* 3.7*  HGB 8.8* 8.9* 8.5* 8.5* 8.9*  HCT 27.0* 27.5* 26.7* 27.2* 27.7*  MCV 87.7 88.4 89.6 90.4 90.2  PLT 189 213 157 171 502   Basic Metabolic Panel: Recent Labs  Lab 10/15/17 0516 10/15/17 1950 10/16/17 0411 10/17/17 0440 10/18/17 0257 10/19/17 0401  NA 132*  --  137 139 142 139  K 4.5  --  4.2 3.9 3.9  3.6  CL 112*  --  109 112* 113* 107  CO2 15*  --  19* 18* 19* 22  GLUCOSE 97  --  99 137* 158* 138*  BUN 67*  --  55* 45* 47* 42*  CREATININE 1.49*  --  1.32* 1.19* 1.19* 1.03*  CALCIUM 7.0*  --  7.2* 6.8* 6.9* 6.8*  MG  --  1.5*  --  2.0  --   --    GFR: Estimated Creatinine Clearance: 33.6 mL/min (A) (by C-G formula based on SCr of 1.03 mg/dL (H)). Liver Function Tests: Recent Labs  Lab 10/15/17 1950  AST 18  ALT 13*  ALKPHOS 39  BILITOT 1.5*  PROT 3.0*  ALBUMIN 1.4*   No results for input(s): LIPASE, AMYLASE in the last 168 hours. No results for input(s): AMMONIA in the last 168 hours. Coagulation Profile: No results for input(s): INR, PROTIME in the last 168 hours. Cardiac Enzymes: No results for input(s): CKTOTAL, CKMB, CKMBINDEX, TROPONINI in the last 168 hours. BNP (last 3 results) No results for input(s): PROBNP in the last 8760 hours. HbA1C: No results for input(s): HGBA1C in the last 72 hours. CBG: Recent Labs  Lab 10/18/17 1133 10/18/17 1653 10/18/17 2234 10/19/17 0745 10/19/17 1133  GLUCAP 129* 192* 148* 126* 168*   Lipid Profile: Recent Labs    10/17/17 0440  TRIG 67   Thyroid Function Tests: No results for input(s): TSH, T4TOTAL, FREET4, T3FREE, THYROIDAB in the last 72 hours. Anemia Panel: No results for input(s): VITAMINB12, FOLATE, FERRITIN, TIBC, IRON, RETICCTPCT in  the last 72 hours. Urine analysis:    Component Value Date/Time   COLORURINE YELLOW 10/01/2015 [place] 10/01/2015 1155   LABSPEC 1.008 10/01/2015 1155   LABSPEC 1.010 05/07/2014 0835   PHURINE 7.5 10/01/2015 1155   GLUCOSEU NEGATIVE 10/01/2015 1155   GLUCOSEU Negative 05/07/2014 0835   HGBUR NEGATIVE 10/01/2015 1155   HGBUR 3+ 07/22/2011 1338   BILIRUBINUR moderate (A) 05/01/2017 1256   BILIRUBINUR negative 01/25/2017 1039   BILIRUBINUR Negative 05/07/2014 0835   KETONESUR trace (5) (A) 05/01/2017 1256   KETONESUR NEGATIVE 10/01/2015 1155   PROTEINUR  trace (A) 05/01/2017 1256   PROTEINUR negative 01/25/2017 1039   PROTEINUR NEGATIVE 10/01/2015 1155   UROBILINOGEN negative (A) 05/01/2017 1256   UROBILINOGEN 0.2 05/07/2014 0835   NITRITE Positive (A) 05/01/2017 1256   NITRITE negative 01/25/2017 1039   NITRITE NEGATIVE 10/01/2015 1155   LEUKOCYTESUR Large (3+) (A) 05/01/2017 1256   LEUKOCYTESUR Negative 05/07/2014 0835   Sepsis Labs: @LABRCNTIP (procalcitonin:4,lacticidven:4)  )No results found for this or any previous visit (from the past 240 hour(s)).    Radiology Studies: Dg Chest Port 1 View  Result Date: 10/18/2017 CLINICAL DATA:  Respiratory distress. EXAM: PORTABLE CHEST 1 VIEW COMPARISON:  Radiograph October 16, 2017. FINDINGS: Stable cardiomediastinal silhouette pulmonary vascular congestion. No pneumothorax is noted. Endotracheal and nasogastric tubes have been removed. Status post left shoulder arthroplasty. Stable left apical scarring. Hyperinflation of the lungs is noted consistent with chronic obstructive pulmonary disease. Atherosclerosis of the thoracic aorta is noted. Stable right basilar atelectasis and effusion is noted. IMPRESSION: Stable right basilar atelectasis and effusion. Aortic atherosclerosis. Hyperinflation of the lungs consistent with chronic obstructive pulmonary disease. Electronically Signed   By: Marijo Conception, M.D.   On: 10/18/2017 07:26   Dg Swallowing Func-speech Pathology  Result Date: 10/18/2017 Objective Swallowing Evaluation: Type of Study: MBS-Modified Barium Swallow Study  Patient Details Name: Gwendolyne Welford MRN: [id number] Date of Birth: 09-Jul-1931 Today's Date: 10/18/2017 Time: SLP Start Time (ACUTE ONLY): 0940 -SLP Stop Time (ACUTE ONLY): 1008 SLP Time Calculation (min) (ACUTE ONLY): 28 min Past Medical History: Past Medical History: Diagnosis Date . Allergy  . Arthritis  . Breast CA ([place])   NO BP OR STICKS IN LEFT ARM . CAD (coronary artery disease)  . Cancer ([place])  . Cardiomyopathy ([place])    improved . Cataract  . Diverticulosis  . Eccrine porocarcinoma of skin  . History of chemotherapy  . History of radiation therapy 1994  left chest wall . Hyperlipidemia  . Hypertension  . Iron malabsorption 09/03/2017 . Osteoporosis  . Ovarian ca ([place])  . Sacral fracture ([place]) 09/2017 . Wears dentures   full bottom-partial top . Wears glasses  Past Surgical History: Past Surgical History: Procedure Laterality Date . ABDOMINAL HYSTERECTOMY  2004 . BREAST SURGERY   . CHOLECYSTECTOMY  1993 . EYE SURGERY    CATARACTS . left breast mastectomy  1994  T3 N1 Mx . MASS EXCISION Left 05/09/2014  Procedure: EXCISION OF NODULE LEFT CHEST ;  Surgeon: Pedro Earls, MD;  Location: [facility];  Service: General;  Laterality: Left; [facility] MODIFIED RADICAL MASTECTOMY W/ AXILLARY LYMPH NODE DISSECTION W/ PECTORALIS MINOR EXCISION  1994  Left . right leg  1990  fx . SHOULDER ARTHROSCOPY   . [place]  replacement-lt . SKIN BIOPSY  04/27/2011  left hand, forearm and posterior arm HPI: 83 y.o. female who presents with intractable back pain from fracture and new  onset Afib w/ likely intermittent RVR. Elevated tropninin noted and  Subjective: pt awake in bed Assessment / Plan / Recommendation CHL IP CLINICAL IMPRESSIONS 10/18/2017 Clinical Impression  Pt demonstrates mild oral and oropharyngeal dysphagia secondary to primary respiratory deconditioning. Pt is visibly short of breath with decreased ability to sustain apneic period during swallow. Depsite this, timing is adequate and airway protection is sufficient without any instances of aspiration or penetration. Pt did have frequent coughing post swallow that may have been related to appearance of slow, but complete transit of bolus through the esophageal column (no radiologist present to confirm). Mild oral residuals and base of tongue residuals pool in vallecule post swallow but are consitently cleared with a second swallow without cues needed. Pt would be at  increased risk of aspiration with any decline in respiratory function and tolerance of diet will likely depend on respiratory endurance with meals. Pt may need additional training in compensatory strategies for pacing with meal. Will follow for assessment at bedside during a meal. Will initiate regular texture and thin liquids.   SLP Visit Diagnosis Dysphagia, oropharyngeal phase (R13.12) Attention and concentration deficit following -- Frontal lobe and executive function deficit following -- Impact on safety and function Moderate aspiration risk   CHL IP TREATMENT RECOMMENDATION 10/18/2017 Treatment Recommendations Therapy as outlined in treatment plan below   Prognosis 10/18/2017 Prognosis for Safe Diet Advancement Good Barriers to Reach Goals -- Barriers/Prognosis Comment -- CHL IP DIET RECOMMENDATION 10/18/2017 SLP Diet Recommendations Regular solids;Thin liquid Liquid Administration via Cup;Straw Medication Administration Whole meds with liquid Compensations Slow rate;Small sips/bites;Minimize environmental distractions Postural Changes Seated upright at 90 degrees;Remain semi-upright after after feeds/meals (Comment)   CHL IP OTHER RECOMMENDATIONS 10/18/2017 Recommended Consults -- Oral Care Recommendations Oral care BID Other Recommendations --   CHL IP FOLLOW UP RECOMMENDATIONS 10/18/2017 Follow up Recommendations Skilled Nursing facility   [facility] IP FREQUENCY AND DURATION 10/18/2017 Speech Therapy Frequency (ACUTE ONLY) min 2x/week Treatment Duration 2 weeks      CHL IP ORAL PHASE 10/18/2017 Oral Phase Impaired Oral - Pudding Teaspoon -- Oral - Pudding Cup -- Oral - Honey Teaspoon -- Oral - Honey Cup -- Oral - Nectar Teaspoon -- Oral - Nectar Cup -- Oral - Nectar Straw -- Oral - Thin Teaspoon -- Oral - Thin Cup Lingual/palatal residue Oral - Thin Straw Lingual/palatal residue Oral - Puree Lingual/palatal residue Oral - Mech Soft -- Oral - Regular Lingual/palatal residue Oral - Multi-Consistency -- Oral - Pill  Lingual/palatal residue Oral Phase - Comment --  CHL IP PHARYNGEAL PHASE 10/18/2017 Pharyngeal Phase Impaired Pharyngeal- Pudding Teaspoon -- Pharyngeal -- Pharyngeal- Pudding Cup -- Pharyngeal -- Pharyngeal- Honey Teaspoon -- Pharyngeal -- Pharyngeal- Honey Cup -- Pharyngeal -- Pharyngeal- Nectar Teaspoon -- Pharyngeal -- Pharyngeal- Nectar Cup -- Pharyngeal -- Pharyngeal- Nectar Straw -- Pharyngeal -- Pharyngeal- Thin Teaspoon -- Pharyngeal -- Pharyngeal- Thin Cup Reduced tongue base retraction;Pharyngeal residue - valleculae Pharyngeal -- Pharyngeal- Thin Straw Pharyngeal residue - valleculae;Reduced tongue base retraction Pharyngeal -- Pharyngeal- Puree Reduced tongue base retraction;Pharyngeal residue - valleculae Pharyngeal -- Pharyngeal- Mechanical Soft -- Pharyngeal -- Pharyngeal- Regular Reduced tongue base retraction;Pharyngeal residue - valleculae Pharyngeal -- Pharyngeal- Multi-consistency -- Pharyngeal -- Pharyngeal- Pill Reduced tongue base retraction;Pharyngeal residue - valleculae Pharyngeal -- Pharyngeal Comment --  CHL IP CERVICAL ESOPHAGEAL PHASE 11/14/2015 Cervical Esophageal Phase (No Data) Pudding Teaspoon -- Pudding Cup -- Honey Teaspoon -- Honey Cup -- Nectar Teaspoon -- Nectar Cup -- Nectar Straw -- Thin Teaspoon -- Thin Cup --  Thin Straw -- Puree -- Mechanical Soft -- Regular -- Multi-consistency -- Pill -- Cervical Esophageal Comment -- CHL IP GO 11/14/2015 Functional Assessment Tool Used MBS Functional Limitations Swallowing Swallow Current Status (X5056) CI Swallow Goal Status (P7948) CI Swallow Discharge Status (A1655) CI Motor Speech Current Status (V7482) (None) Motor Speech Goal Status (L0786) (None) Motor Speech Goal Status (L5449) (None) Spoken Language Comprehension Current Status (E0100) (None) Spoken Language Comprehension Goal Status (F1219) (None) Spoken Language Comprehension Discharge Status [phone]) (None) Spoken Language Expression Current Status [phone]) (None) Spoken  Language Expression Goal Status (Y6415) (None) Spoken Language Expression Discharge Status [phone]) (None) Attention Current Status (M7680) (None) Attention Goal Status (S8110) (None) Attention Discharge Status (R1594) (None) Memory Current Status (V8592) (None) Memory Goal Status (T2446) (None) Memory Discharge Status (K8638) (None) Voice Current Status (T7711) (None) Voice Goal Status (A5790) (None) Voice Discharge Status (X8333) (None) Other Speech-Language Pathology Functional Limitation Current Status (O3291) (None) Other Speech-Language Pathology Functional Limitation Goal Status (B1660) (None) Other Speech-Language Pathology Functional Limitation Discharge Status [phone]) (None) Herbie Baltimore, MA CCC-SLP [phone] [place], Katherene Ponto 10/18/2017, 10:34 AM                Scheduled Meds: . amiodarone  200 mg Oral BID  . aspirin EC  81 mg Oral QHS  . atorvastatin  20 mg Oral QHS  . carvedilol  6.25 mg Oral BID WC  . chlorhexidine gluconate (MEDLINE KIT)  15 mL Mouth Rinse BID  . dextromethorphan-guaiFENesin  1 tablet Oral BID  . feeding supplement (ENSURE ENLIVE)  237 mL Oral BID BM  . furosemide  10 mg Oral Daily  . heparin injection (subcutaneous)  5,000 Units Subcutaneous Q8H  . insulin aspart  0-5 Units Subcutaneous QHS  . insulin aspart  0-9 Units Subcutaneous TID WC  . ipratropium-albuterol  3 mL Nebulization TID  . lisinopril  2.5 mg Oral Daily  . mouth rinse  15 mL Mouth Rinse BID  . methylPREDNISolone (SOLU-MEDROL) injection  40 mg Intravenous Q12H  . pantoprazole  40 mg Oral Daily  . senna-docusate  1 tablet Oral QHS   Continuous Infusions: . lactated ringers 50 mL/hr at 10/19/17 0450     LOS: 8 days   Time Spent in minutes   30 minutes  Lyanne Kates D.O. on 10/19/2017 at 2:41 PM  Between 7am to 7pm - Pager - [phone]  After 7pm go to [email or web address] - password [email or web address]  And look for the night coverage person covering for me after hours  [facility] Office  [phone]

## 2017-10-19 NOTE — Progress Notes (Signed)
Nutrition Follow-up  DOCUMENTATION CODES:   Underweight  INTERVENTION:  1. Ensure Enlive po BID, each supplement provides 350 kcal and 20 grams of protein  NUTRITION DIAGNOSIS:   Inadequate oral intake related to inability to eat as evidenced by NPO status. -resolved  GOAL:   Patient will meet greater than or equal to 90% of their needs -unmet  MONITOR:   Diet advancement, Weight trends, Labs, Skin  ASSESSMENT:   83 yo female admitted with acute lower back pain with sacral fracture, right L5 vertebral fracture with hx of osteoporosis. Pt with hx of HTN, HLD, CAD, breast cancer, endocrine pleural carcinoma skin  Spoke with patient and family at bedside today. Seemed anxious today, in pain. Ate Eggs, toast, sausage, jelly, and coffee for breakfast - consumed 15% Continues to sip ensure, but struggling to meet PO needs. Possible discharge tomorrow.  Meal Completion: 10-15% Labs reviewed:  CBGs 168, 128, 148 Medications reviewed and include:  Insulin, Solumedrol, Senokot   12/1 Pt was to d/c to [facility] yesterday afternoon/evening. While in the holding area for d/c, she became SOB and O2 was 78% on 2L via [place]. Rapid Response called and pt was emergently intubated and transferred to [facility]. OGT was placed. Pt remains intubated at this time with OGT to LIS with last output of 50cc ~10 AM this AM. Son at bedside at time of RD visit. Weight has fluctuated since admission; will continue to monitor weight trends closely.  Diet Order:  Diet regular Room service appropriate? Yes; Fluid consistency: Thin  EDUCATION NEEDS:   Not appropriate for education at this time  Skin:  Skin Assessment: Skin Integrity Issues: Skin Integrity Issues:: Stage I Stage I: Sacrum  Last BM:  11/28  Height:   Ht Readings from Last 1 Encounters:  10/18/17 5\' 3"  (1.6 m)    Weight:   Wt Readings from Last 1 Encounters:  10/19/17 116 lb 2.9 oz (52.7 kg)    Ideal Body Weight:  52.27  kg  BMI:  Body mass index is 20.58 kg/m.  Estimated Nutritional Needs:   Kcal:  1350-1550 kcals  Protein:  65-75 g  Fluid:  >/ = 1.4 L  Satira Anis. Sriyan Cutting, MS, RD LDN Inpatient Clinical Dietitian Pager [phone]

## 2017-10-19 NOTE — Progress Notes (Signed)
[place] re-submitting for insurance authorization in anticipation of possible discharge tomorrow.   Percell Locus Haward Pope LCSWA [phone]

## 2017-10-19 NOTE — Progress Notes (Signed)
  Speech Language Pathology Treatment: Dysphagia  Patient Details Name: Judy Herrera MRN: [id number] DOB: 12-20-1930 Today's Date: 10/19/2017 Time: 9628-3662 SLP Time Calculation (min) (ACUTE ONLY): 12 min  Assessment / Plan / Recommendation Clinical Impression  Pt was seen for dysphagia treatment.  Therapist facilitated the session with presentations of pt's currently prescribed diet to monitor toleration following MBS yesterday.  Pt reports poor PO intake overall.  Initially, pt's respiratory rate was [facility] on supplemental O2 via nasal cannula and pt had no overt s/s of aspiration with solids or liquids.  As trials progressed, pt's respiratory rate increased and pt began exhibiting s/s of aspiration characterized by delayed coughing.  Pt also reported increased fatigue with PO intake.  SLP discussed the need for frequent rest breaks during meals and break each meal into smaller, more manageable units of time.  Pt verbalized understanding.  Pt was left in bed with bed alarm set and call bell within reach.  Continue per current plan of care.    HPI HPI: 83 y.o. female who presents with intractable back pain from fracture and new onset Afib w/ likely intermittent RVR. Elevated tropninin noted and      SLP Plan  Continue with current plan of care       Recommendations  Diet recommendations: Regular;Thin liquid Liquids provided via: Straw;Cup Medication Administration: Whole meds with liquid Supervision: Patient able to self feed Compensations: Slow rate;Small sips/bites;Minimize environmental distractions                Oral Care Recommendations: Oral care BID Follow up Recommendations: Skilled Nursing facility SLP Visit Diagnosis: Dysphagia, oropharyngeal phase (R13.12) Plan: Continue with current plan of care       GO                PageSelinda Herrera 10/19/2017, 4:17 PM

## 2017-10-19 NOTE — Care Management Important Message (Signed)
Important Message  Patient Details  Name: Judy Herrera MRN: [id number] Date of Birth: September 27, 1931   Medicare Important Message Given:  Yes    Nathen May 10/19/2017, 9:13 AM

## 2017-10-20 DIAGNOSIS — S3210XA Unspecified fracture of sacrum, initial encounter for closed fracture: Secondary | ICD-10-CM

## 2017-10-20 LAB — CBC
HCT: 25.2 % — ABNORMAL LOW (ref 36.0–46.0)
HEMOGLOBIN: 8.2 g/dL — AB (ref 12.0–15.0)
MCH: 29.2 pg (ref 26.0–34.0)
MCHC: 32.5 g/dL (ref 30.0–36.0)
MCV: 89.7 fL (ref 78.0–100.0)
Platelets: 189 10*3/uL (ref 150–400)
RBC: 2.81 MIL/uL — ABNORMAL LOW (ref 3.87–5.11)
RDW: 20.3 % — AB (ref 11.5–15.5)
WBC: 6.1 10*3/uL (ref 4.0–10.5)

## 2017-10-20 LAB — BASIC METABOLIC PANEL
Anion gap: 11 (ref 5–15)
BUN: 36 mg/dL — AB (ref 6–20)
CALCIUM: 6.6 mg/dL — AB (ref 8.9–10.3)
CHLORIDE: 105 mmol/L (ref 101–111)
CO2: 21 mmol/L — AB (ref 22–32)
CREATININE: 1.02 mg/dL — AB (ref 0.44–1.00)
GFR calc non Af Amer: 49 mL/min — ABNORMAL LOW (ref >60)
GFR, EST AFRICAN AMERICAN: 57 mL/min — AB (ref >60)
Glucose, Bld: 117 mg/dL — ABNORMAL HIGH (ref 65–99)
Potassium: 4.1 mmol/L (ref 3.5–5.1)
SODIUM: 137 mmol/L (ref 135–145)

## 2017-10-20 LAB — GLUCOSE, CAPILLARY
GLUCOSE-CAPILLARY: 114 mg/dL — AB (ref 65–99)
GLUCOSE-CAPILLARY: 147 mg/dL — AB (ref 65–99)
Glucose-Capillary: 158 mg/dL — ABNORMAL HIGH (ref 65–99)
Glucose-Capillary: 149 mg/dL — ABNORMAL HIGH (ref 65–99)

## 2017-10-20 MED ORDER — CHLORHEXIDINE GLUCONATE 0.12 % MT SOLN
OROMUCOSAL | Status: AC
  Filled 2017-10-20: qty 15

## 2017-10-20 MED ORDER — LISINOPRIL 2.5 MG PO TABS: 2.5000 mg | ORAL_TABLET | Freq: Every day | ORAL | 0 refills | Status: AC

## 2017-10-20 MED ORDER — FUROSEMIDE 20 MG PO TABS: 10.0000 mg | ORAL_TABLET | Freq: Every day | ORAL | 0 refills | Status: AC

## 2017-10-20 MED ORDER — ATORVASTATIN CALCIUM 20 MG PO TABS: 20.0000 mg | ORAL_TABLET | Freq: Every day | ORAL | 0 refills | Status: AC

## 2017-10-20 MED ORDER — ALBUTEROL SULFATE (2.5 MG/3ML) 0.083% IN NEBU: 2.5000 mg | INHALATION_SOLUTION | RESPIRATORY_TRACT | 12 refills | Status: AC | PRN

## 2017-10-20 MED ORDER — AMIODARONE HCL 200 MG PO TABS: 200.0000 mg | ORAL_TABLET | Freq: Two times a day (BID) | ORAL | 0 refills | Status: AC

## 2017-10-20 MED ORDER — DM-GUAIFENESIN ER 30-600 MG PO TB12: 1.0000 | ORAL_TABLET | Freq: Two times a day (BID) | ORAL | Status: AC

## 2017-10-20 NOTE — Progress Notes (Signed)
Patient is waiting for insurance authorization to come through before being discharged and then social work will call [facility] for transport.

## 2017-10-20 NOTE — Progress Notes (Signed)
[facility] still has not approved SNF transfer. [facility] will not accept an LOG. MD aware. CSW will check back in the morning.   Percell Locus Rayden Scheper LCSWA [phone]

## 2017-10-20 NOTE — Progress Notes (Signed)
Physical Therapy Treatment Patient Details Name: Judy Herrera MRN: [id number] DOB: 10-Apr-1931 Today's Date: 10/20/2017    History of Present Illness Pt is a 83 y.o. 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 CXR incidentally noted question of mass in left lung apex    PT Comments    Patient with slow progress, but able to use the walker this session to take pivotal steps to chair.  Fatigued quickly in standing while PT assist with hygiene and needed assist to sit slowly due to buckling of knees.  Continue to feel SNF level rehab is appropriate at d/c.    Follow Up Recommendations  SNF;Supervision/Assistance - 24 hour     Equipment Recommendations  None recommended by PT    Recommendations for Other Services       Precautions / Restrictions Precautions Precautions: Fall    Mobility  Bed Mobility Overal bed mobility: Needs Assistance Bed Mobility: Rolling;Sidelying to Sit Rolling: Min assist Sidelying to sit: Min assist       General bed mobility comments: cues for technique and assist for flexing legs to assist to roll and reach for rail, assist for trunk to upright, increased time needed  Transfers Overall transfer level: Needs assistance Equipment used: Rolling walker (2 wheeled) Transfers: Sit to/from [facility] Sit to Stand: Mod assist;+2 physical assistance Stand pivot transfers: Mod assist;+2 safety/equipment       General transfer comment: lifting assist to walker with cues, pt took several pivotal steps with significant difficulty moving R foot with pain, pivoted to chair with 1 assist and second for  obtaining supplies for hygiene due to small bowel incontinence.   Ambulation/Gait                 Stairs            Wheelchair Mobility    Modified Rankin (Stroke Patients Only)       Balance Overall balance assessment: Needs assistance Sitting-balance support: Feet supported Sitting balance-Leahy Scale: Fair     Standing balance support: Bilateral upper extremity supported Standing balance-Leahy Scale: Poor Standing balance comment: UE support and mod A for balance                            Cognition Arousal/Alertness: Awake/alert Behavior During Therapy: WFL for tasks assessed/performed Overall Cognitive Status: Within Functional Limits for tasks assessed                                        Exercises Total Joint Exercises Ankle Circles/Pumps: AROM;5 reps;Supine Heel Slides: AAROM;AROM;Both;5 reps;Supine    General Comments General comments (skin integrity, edema, etc.): SOB with activity SpO2 95% HR 105 on 2L O2      Pertinent Vitals/Pain Pain Score: 3  Pain Location: back/sacrum Pain Descriptors / Indicators: Grimacing;Aching Pain Intervention(s): Repositioned;Monitored during session    Home Living                      Prior Function  PT Goals (current goals can now be found in the care plan section) Progress towards PT goals: Progressing toward goals    Frequency    Min 3X/week      PT Plan Current plan remains appropriate    Co-evaluation              AM-PAC PT "6 Clicks" Daily Activity  Outcome Measure  Difficulty turning over in bed (including adjusting bedclothes, sheets and blankets)?: Unable Difficulty moving from lying on back to sitting on the side of the bed? : Unable Difficulty sitting down on and standing up from a chair with arms (e.g., wheelchair, bedside commode, etc,.)?: Unable Help needed moving to and from a bed to chair (including a wheelchair)?: A  Lot Help needed walking in hospital room?: Total Help needed climbing 3-5 steps with a railing? : Total 6 Click Score: 7    End of Session Equipment Utilized During Treatment: Gait belt Activity Tolerance: Patient limited by fatigue Patient left: in chair;with call bell/phone within reach;with chair alarm set;with nursing/sitter in room   PT Visit Diagnosis: Muscle weakness (generalized) (M62.81);Difficulty in walking, not elsewhere classified (R26.2)     Time: 8144-8185 PT Time Calculation (min) (ACUTE ONLY): 24 min  Charges:  $Therapeutic Exercise: 8-22 mins $Therapeutic Activity: 8-22 mins                    G CodesMagda Kiel, [place] [phone] 10/20/2017    Reginia Naas 10/20/2017, 10:53 AM

## 2017-10-20 NOTE — Discharge Summary (Addendum)
Physician Discharge Summary  Judy Herrera IAX:[id number] DOB: 1930/12/30 DOA: 10/11/2017  PCP: Marletta Lor, MD  Admit date: 10/11/2017 Discharge date:10/21/2017 Admitted From: Home Disposition: Skilled nursing facility Recommendations for Outpatient Follow-up:  1. Follow up with PCP in 1-2 weeks 2. Please obtain BMP/CBC in one week  Home Health none Equipment/Devices: Oxygen 2 L nasal cannula Discharge Condition stable.  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 X-rays and overall exam was unremarkable, she was ambulatory and discharged home. However, this morning she woke up unable to get out of bed without assistance. She had, much more severe lower back pain.She denies any numbness or tingling, she denies any constipation or difficulty with urination or incontinence. The patient denies any saddle anesthesia. She denies any rest pain or palpitations. NO changes in meds or herbal productsShe denies any shortness of breath cough. Of note, in route, EMS noted that the patient was very tachycardic, with heart rate between 110-120s. The EMS strips suggested possible atrial fibrillation, but she was essentially asymptomatic. She denies any syncope or presyncope, chest pain or palpitations. Denies shortness of breathThis is new to her, denies any history of atrial fibrillation.  Interim history Patient had  respiratory failure with hypoxia and hypercapnia, with age fibrillation and RVR, required intubation.  She was intubated on 10/15/2017 extubated 10/16/2017.  Post extubation patient had stridor which was treated with IV steroids and resolved.   Discharge Diagnoses:  Principal Problem:   Sacral fracture ([place]) Active Problems:   Hyperlipidemia   Essential hypertension   Osteoporosis   BREAST CANCER, HX OF   Ascending aortic aneurysm (HCC)   Iron deficiency anemia due to chronic blood loss   Iron malabsorption   Lung nodule   Tachycardia   Atrial fibrillation (HCC)   Elevated troponin I measurement   PAT (paroxysmal atrial tachycardia) (HCC)   Pressure injury of skin   CHF (congestive heart failure) (HCC) Acute respiratory failure with hypoxia and hypercapnia -In the setting of acute systolic/diastolic heart failure, atrial fibrillation, obstructive lung disease -PCCM called for intubation -Patient successfully extubated -Continue to taper Solu-Medrol, bronchodilators, Mucinex -Attempt to wean nasal cannula if possible  Acute lower back pain with sacral fracture/history of osteoporosis, right L5 vertebral fracture -CT of the lumbar spine showed L5 transverse fracture and sacral insufficiency fracture -Patient received fentanyl, hydrocodone, IV fluids without synechia relief and was admitted -PT, OT consulted recommended SNF -Continue vitamin D and calcium supplementation -Continue pain control with bowel regimen  Atrial fibrillation with RVR/MAT/NSVT/elevated troponin -patient developed AF with respiratory failure -Cardiology consulted and appreciated -required intubation, patient received adenosine, rhythm then showed atrial flutter, and had electrical cardioversion as she remained unstable -patient currently in sinus rhythm  -Cardiology had spoken with the family on 11/30: Discussed long-term management options, possibility of false electrophysiology services and radio  frequency ablation. Issue surrounding patient's age and comorbid conditions suggest a more conservative approach is appropriate at this time. -Placed on oral amiodarone, Coreg  Acute on Chronic CHF patient on 10 mg of Lasix  daily ejection fraction 40%.  Continue the same dose upon discharge.  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 Marin Olp.     Discharge Instructions follow-up with Dr. Marin Olp and PCP and cardiology.  Discharge Instructions    Amb referral to AFIB Clinic   Complete by:  As directed    Discharge instructions   Complete by:  As directed    Patient will be discharged to [facility] skilled nursing facility. Continue physical and occupational rehab.  Patient will need to follow up with primary care provider within one week of discharge, repeat CBC and BMP.  Follow up with cardiology in 2-3 weeks. Patient should continue medications as prescribed.  Patient should follow a heart healthy diet.     Allergies as of 10/20/2017      Reactions   Ibuprofen Nausea Only   Morphine Nausea And Vomiting      Medication List    STOP taking these medications   lisinopril-hydrochlorothiazide 20-12.5 MG tablet Commonly known as:  PRINZIDE,ZESTORETIC   pantoprazole 40 MG tablet Commonly known as:  PROTONIX     TAKE these medications   acetaminophen 325 MG tablet Commonly known as:  TYLENOL Take 2 tablets (650 mg total) by mouth every 6 (six) hours as needed for mild pain (or Fever >/= 101).   albuterol (2.5 MG/3ML) 0.083% nebulizer solution Commonly known as:  PROVENTIL Take 3 mLs (2.5 mg total) by nebulization every 4 (four) hours as needed for wheezing or shortness of breath.   amiodarone 200 MG tablet Commonly known as:  PACERONE Take 1 tablet (200 mg total) by mouth 2 (two) times daily.   aspirin 81 MG EC tablet Take 1 tablet (81 mg total) by mouth at bedtime.   atorvastatin 20 MG tablet Commonly known as:   LIPITOR Take 1 tablet (20 mg total) by mouth at bedtime.   bisacodyl 10 MG suppository Commonly known as:  DULCOLAX Place 1 suppository (10 mg total) rectally daily as needed for moderate constipation.   carvedilol 6.25 MG tablet Commonly known as:  COREG Take 1 tablet (6.25 mg total) by mouth 2 (two) times daily with a meal. What changed:    medication strength  how much to take   denosumab 60 MG/ML Soln injection Commonly known as:  PROLIA Inject 60 mg into the skin every 6 (six) months. Administer in upper arm, thigh, or abdomen   dextromethorphan-guaiFENesin 30-600 MG 12hr tablet Commonly known as:  MUCINEX DM Take 1 tablet by mouth 2 (two) times daily.   feeding supplement (ENSURE ENLIVE) Liqd Take 237 mLs by mouth 2 (two) times daily between meals.   furosemide 20 MG tablet Commonly known as:  LASIX Take 0.5 tablets (10 mg total) by mouth daily. Start taking on:  10/21/2017   HYDROcodone-acetaminophen 5-325 MG tablet Commonly known as:  NORCO/VICODIN Take 1-2 tablets by mouth every 4 (four) hours as needed for moderate pain.   lisinopril 2.5 MG tablet Commonly known as:  PRINIVIL,ZESTRIL Take 1 tablet (2.5 mg total) by mouth daily. Start taking on:  10/21/2017   loperamide 2 MG capsule Commonly known as:  IMODIUM Take 1 capsule (2 mg total) by mouth as needed for diarrhea or loose stools.   megestrol 625 MG/5ML suspension Commonly known as:  MEGACE ES Take 5 mLs (625 mg total) by mouth daily.   ondansetron 4 MG tablet Commonly known as:  ZOFRAN Take 1 tablet (4 mg total) by mouth every 6 (six)  hours.   senna-docusate 8.6-50 MG tablet Commonly known as:  Senokot-S Take 1 tablet by mouth at bedtime.   simvastatin 40 MG tablet Commonly known as:  ZOCOR TAKE 1 TABLET AT BEDTIME What changed:    how much to take  how to take this  when to take this      [place] Follow up.    Specialty:  Emergency Medicine Why:  If symptoms worsen Contact information: [address] [id number] [place]       Marletta Lor, MD Follow up.   Specialty:  Internal Medicine Contact information: [place] [address] [phone]        Almyra Deforest, [place] Follow up.   Specialties:  Cardiology, Radiology Why:  [facility] - 10/26/17 at 9:30am. Arrive 15 minutes early. Isaac Laud is one of the PAs with Dr. Stanford Breed. Please weigh self daily and call if you gain 3 pounds (or more) from baseline so we can adjust medicines. Contact information: [address] [place] [address] [phone]          Allergies  Allergen Reactions  . Ibuprofen Nausea Only  . Morphine Nausea And Vomiting    Consultations:  CARDS/PCCM   Procedures/Studies: Dg Chest 1 View  Addendum Date: 10/11/2017   ADDENDUM REPORT: 10/11/2017 11:56 ADDENDUM: These results were called by telephone at the time of interpretation on 10/11/2017 at 11:56 am to Dr. Brantley Stage , who verbally acknowledged these results. Electronically Signed   By: Nolon Nations M.D.   On: 10/11/2017 11:56   Result Date: 10/11/2017 CLINICAL DATA:  New onset AFib. Fell down yesterday. Lower back pain. EXAM: CHEST 1 VIEW COMPARISON:  11/28/2016 FINDINGS: Lungs are hyperinflated. Heart size is enlarged. There is increased left apical opacity, raising the question of pleural thickening or mass. Further evaluation with CT is recommended. Prominence of the right hilar region appears stable over time. Surgical clips overlie the left axilla. IMPRESSION: 1. Hyperinflation and stable cardiomegaly. 2. Question of mass the left lung apex. Further evaluation is CT of the chest is recommended. Intravenous contrast is recommended unless contraindicated. 3. These results will be called to the ordering clinician or representative by the Radiologist Assistant,  and communication documented in the PACS or zVision Dashboard. Electronically Signed: By: Nolon Nations M.D. On: 10/11/2017 11:43   Dg Chest 2 View  Result Date: 10/13/2017 CLINICAL DATA:  Shortness of breath.  History of breast cancer. EXAM: CHEST  2 VIEW COMPARISON:  Single-view of the chest 10/11/2017. PA and lateral chest 11/28/2016. CT chest 10/01/2015. FINDINGS: The patient has a small to moderate left pleural effusion with basilar airspace disease, new since the prior exam. The right lung is clear. Heart size is upper normal. No pneumothorax. Aortic atherosclerosis is noted. The patient is status post left mastectomy and axillary dissection. Left shoulder replacement is identified. IMPRESSION: Small to moderate left pleural effusion with associated basilar airspace disease which could be atelectasis or infection, new since the most recent exam. Atherosclerosis. Electronically Signed   By: Inge Rise M.D.   On: 10/13/2017 14:16   Dg Lumbar Spine Complete  Result Date: 10/11/2017 CLINICAL DATA:  Fall yesterday.  Back pain EXAM: LUMBAR SPINE - COMPLETE 4+ VIEW COMPARISON:  CT abdomen pelvis 05/02/2013 FINDINGS: Normal alignment. Severe chronic compression fracture T12 unchanged from 2014. No acute fracture. Mild disc degeneration L2-3 and L3-4.  Negative for  pars defect Advanced atherosclerotic calcification the thoracic and abdominal aorta with prior retroperitoneal surgery and multiple metal clips. Gallbladder clips. IMPRESSION: Severe chronic compression fracture T12.  No acute fracture. Electronically Signed   By: Franchot Gallo M.D.   On: 10/11/2017 11:43   Dg Abd 1 View  Result Date: 10/15/2017 CLINICAL DATA:  Evaluate OG tube placement. EXAM: ABDOMEN - 1 VIEW COMPARISON:  None. FINDINGS: The OG tube terminates in left upper quadrant/stomach. IMPRESSION: The OG tube terminates in the stomach. Electronically Signed   By: Dorise Bullion III M.D   On: 10/15/2017 18:56   Ct Lumbar  Spine Wo Contrast  Result Date: 10/11/2017 CLINICAL DATA:  Severe low back pain after sitting down quickly in a chair. EXAM: CT LUMBAR SPINE WITHOUT CONTRAST TECHNIQUE: Multidetector CT imaging of the lumbar spine was performed without intravenous contrast administration. Multiplanar CT image reconstructions were also generated. COMPARISON:  Lumbar spine radiographs 10/11/2017. CT abdomen and pelvis 05/02/2013. FINDINGS: Segmentation: 5 lumbar type vertebrae. Alignment: Mild upper lumbar levoscoliosis. Trace retrolisthesis of L2 on L3. Vertebrae: Chronic severe T12 vertebral fracture with diffuse vertebral body sclerosis and complete height loss centrally. T12 vertebral retropulsion measures 5 mm and results in mild spinal stenosis. There are acute mildly displaced fractures through the sacral ala bilaterally. There is also a nondisplaced right L5 transverse process fracture. No suspicious osseous lesion is identified. Paraspinal and other soft tissues: Aortoiliac atherosclerosis without aneurysm. Bilateral renal atrophy. Trace right and small left pleural effusions. Prior cholecystectomy. Disc levels: At L2-3, there is mild-to-moderate disc space narrowing and degenerative endplate sclerosis with circumferential disc bulging resulting in mild right neural foraminal stenosis and likely right lateral recess stenosis. Milder disc bulging is present at L3-4 and L4-5 resulting in mild neural foraminal narrowing but no significant spinal stenosis. IMPRESSION: 1. Acute sacral insufficiency type fractures. 2. Nondisplaced right L5 transverse process fracture. 3. Chronic T12 fracture with vertebra plana configuration, retropulsion, and mild spinal stenosis. 4.  Aortic Atherosclerosis (ICD10-I70.0). Electronically Signed   By: Logan Bores M.D.   On: 10/11/2017 14:05   US Renal  Result Date: 10/14/2017 CLINICAL DATA:  Acute kidney injury EXAM: RENAL / URINARY TRACT ULTRASOUND COMPLETE COMPARISON:  None. FINDINGS:  Right Kidney: Length: 10 cm. Generalized cortical thinning. No hydronephrosis. No perinephric collection or mass. Left Kidney: Length: 8.6 cm. Generalized cortical thinning. There is a dilated renal pelvis without hydronephrosis. No perinephric collection or mass. Bladder: Moderately distended.  Bilateral ureteral jets were noted. Bilateral pleural effusion was partially visualized. IMPRESSION: 1. Negative for hydronephrosis or other acute finding. 2. Bilateral renal atrophy. 3. Bilateral pleural effusion that are small based on radiography yesterday. Electronically Signed   By: Monte Fantasia M.D.   On: 10/14/2017 17:05   Nm Pulmonary Perf And Vent  Result Date: 10/13/2017 CLINICAL DATA:  Evaluate for acute pulmonary embolus. High pretest probability EXAM: NUCLEAR MEDICINE VENTILATION - PERFUSION LUNG SCAN TECHNIQUE: Ventilation images were obtained in multiple projections using inhaled aerosol Tc-11mDTPA. Perfusion images were obtained in multiple projections after intravenous injection of Tc-924mAA. RADIOPHARMACEUTICALS:  32.0 mCi Technetium-9934mPA aerosol inhalation and 4.2 mCi Technetium-90m57m IV COMPARISON:  Chest radiograph from 10/13/2017 FINDINGS: .: FINDINGS: . Ventilation: There is diffuse heterogeneous distribution of the radiopharmaceutical throughout both lungs. Perfusion: Mildly heterogeneous distribution of the radiopharmaceutical to both lungs. No unmatched wedge shaped peripheral perfusion defects to suggest acute pulmonary embolism. IMPRESSION: No unmatched segmental perfusion defects identified to suggest acute pulmonary embolus. Heterogeneous ventilation and perfusion  to both lungs compatible with obstructive pulmonary disease. Electronically Signed   By: Kerby Moors M.D.   On: 10/13/2017 13:55   Dg Chest Port 1 View  Result Date: 10/18/2017 CLINICAL DATA:  Respiratory distress. EXAM: PORTABLE CHEST 1 VIEW COMPARISON:  Radiograph October 16, 2017. FINDINGS: Stable  cardiomediastinal silhouette pulmonary vascular congestion. No pneumothorax is noted. Endotracheal and nasogastric tubes have been removed. Status post left shoulder arthroplasty. Stable left apical scarring. Hyperinflation of the lungs is noted consistent with chronic obstructive pulmonary disease. Atherosclerosis of the thoracic aorta is noted. Stable right basilar atelectasis and effusion is noted. IMPRESSION: Stable right basilar atelectasis and effusion. Aortic atherosclerosis. Hyperinflation of the lungs consistent with chronic obstructive pulmonary disease. Electronically Signed   By: Marijo Conception, M.D.   On: 10/18/2017 07:26   Dg Chest Port 1 View  Result Date: 10/16/2017 CLINICAL DATA:  CHF, shortness of Breath EXAM: PORTABLE CHEST 1 VIEW COMPARISON:  10/15/2017 FINDINGS: Tracheostomy and NG tube are unchanged. There is hyperinflation of the lungs compatible with COPD. Left apical pleural/parenchymal thickening and scarring. Probable right effusion and bibasilar atelectasis. No real change since prior study. IMPRESSION: No interval change. Electronically Signed   By: Rolm Baptise M.D.   On: 10/16/2017 07:13   Dg Chest Port 1 View  Result Date: 10/15/2017 CLINICAL DATA:  Intubation EXAM: PORTABLE CHEST 1 VIEW COMPARISON:  Portable exam 1733 hours compared to 1615 hours FINDINGS: New endotracheal tube with tip projecting 3.6 cm above carina. Nasogastric tube extending into stomach. Normal heart size, mediastinal contours, and pulmonary vascularity. Lungs emphysematous with probable effusion at RIGHT base. Probable bibasilar atelectasis. Upper lungs clear. No pneumothorax. Volume loss in the LEFT upper lobe with superior retraction LEFT hilum and question old granulomatous disease. Asymmetric LEFT apical scarring. Bones demineralized. Surgical clips at LEFT axilla. IMPRESSION: Tube positions as above. COPD changes with LEFT upper lobe volume loss, probable RIGHT pleural effusion and bibasilar  atelectasis. No active disease. Electronically Signed   By: Lavonia Dana M.D.   On: 10/15/2017 18:08   Dg Chest Port 1 View  Result Date: 10/15/2017 CLINICAL DATA:  83 year old female with a history of shortness of breath EXAM: PORTABLE CHEST 1 VIEW COMPARISON:  10/13/2017, 10/11/2017, 11/28/2016 FINDINGS: Cardiomediastinal silhouette unchanged with cardiomegaly. Fullness in the central vasculature. New opacity at the right lung base with partial obscuration of the right heart border and obscuration of the right hemidiaphragm. Incomplete imaging at the right lung base with partial exclusion. Partial obscuration of the left hemidiaphragm with blunting of left costophrenic angle. No pneumothorax. Coarsened interstitial markings and pleuroparenchymal thickening. Surgical changes of the left axilla. Surgical changes of left shoulder arthroplasty. IMPRESSION: New bilateral pleural effusions, larger on the right, with associated atelectasis/ consolidation. Surgical changes of the left axilla. Electronically Signed   By: Corrie Mckusick D.O.   On: 10/15/2017 16:35   Dg Swallowing Func-speech Pathology  Result Date: 10/18/2017 Objective Swallowing Evaluation: Type of Study: MBS-Modified Barium Swallow Study  Patient Details Name: Zhara Gieske MRN: [id number] Date of Birth: 12/24/1930 Today's Date: 10/18/2017 Time: SLP Start Time (ACUTE ONLY): 0940 -SLP Stop Time (ACUTE ONLY): 1008 SLP Time Calculation (min) (ACUTE ONLY): 28 min Past Medical History: Past Medical History: Diagnosis Date . Allergy  . Arthritis  . Breast CA ([place])   NO BP OR STICKS IN LEFT ARM . CAD (coronary artery disease)  . Cancer ([place])  . Cardiomyopathy ([place])   improved . Cataract  . Diverticulosis  .  [place] porocarcinoma of skin  . History of chemotherapy  . History of radiation therapy 1994  left chest wall . Hyperlipidemia  . Hypertension  . Iron malabsorption 09/03/2017 . Osteoporosis  . Ovarian ca ([place])  . Sacral fracture ([place]) 09/2017 .  Wears dentures   full bottom-partial top . Wears glasses  Past Surgical History: Past Surgical History: Procedure Laterality Date . ABDOMINAL HYSTERECTOMY  2004 . BREAST SURGERY   . CHOLECYSTECTOMY  1993 . EYE SURGERY    CATARACTS . left breast mastectomy  1994  T3 N1 Mx . MASS EXCISION Left 05/09/2014  Procedure: EXCISION OF NODULE LEFT CHEST ;  Surgeon: Pedro Earls, MD;  Location: [facility];  Service: General;  Laterality: Left; [facility] MODIFIED RADICAL MASTECTOMY W/ AXILLARY LYMPH NODE DISSECTION W/ PECTORALIS MINOR EXCISION  1994  Left . right leg  1990  fx . SHOULDER ARTHROSCOPY   . [place]  replacement-lt . SKIN BIOPSY  04/27/2011  left hand, forearm and posterior arm HPI: 83 y.o. female who presents with intractable back pain from fracture and new onset Afib w/ likely intermittent RVR. Elevated tropninin noted and  Subjective: pt awake in bed Assessment / Plan / Recommendation [facility] IP CLINICAL IMPRESSIONS 10/18/2017 Clinical Impression  Pt demonstrates mild oral and oropharyngeal dysphagia secondary to primary respiratory deconditioning. Pt is visibly short of breath with decreased ability to sustain apneic period during swallow. Depsite this, timing is adequate and airway protection is sufficient without any instances of aspiration or penetration. Pt did have frequent coughing post swallow that may have been related to appearance of slow, but complete transit of bolus through the esophageal column (no radiologist present to confirm). Mild oral residuals and base of tongue residuals pool in vallecule post swallow but are consitently cleared with a second swallow without cues needed. Pt would be at increased risk of aspiration with any decline in respiratory function and tolerance of diet will likely depend on respiratory endurance with meals. Pt may need additional training in compensatory strategies for pacing with meal. Will follow for assessment at bedside during a meal. Will  initiate regular texture and thin liquids.   SLP Visit Diagnosis Dysphagia, oropharyngeal phase (R13.12) Attention and concentration deficit following -- Frontal lobe and executive function deficit following -- Impact on safety and function Moderate aspiration risk   [facility] IP TREATMENT RECOMMENDATION 10/18/2017 Treatment Recommendations Therapy as outlined in treatment plan below   Prognosis 10/18/2017 Prognosis for Safe Diet Advancement Good Barriers to Reach Goals -- Barriers/Prognosis Comment -- [facility] IP DIET RECOMMENDATION 10/18/2017 SLP Diet Recommendations Regular solids;Thin liquid Liquid Administration via Cup;Straw Medication Administration Whole meds with liquid Compensations Slow rate;Small sips/bites;Minimize environmental distractions Postural Changes Seated upright at 90 degrees;Remain semi-upright after after feeds/meals (Comment)   [facility] IP OTHER RECOMMENDATIONS 10/18/2017 Recommended Consults -- Oral Care Recommendations Oral care BID Other Recommendations --   [facility] IP FOLLOW UP RECOMMENDATIONS 10/18/2017 Follow up Recommendations Skilled Nursing facility   [facility] IP FREQUENCY AND DURATION 10/18/2017 Speech Therapy Frequency (ACUTE ONLY) min 2x/week Treatment Duration 2 weeks      [facility] IP ORAL PHASE 10/18/2017 Oral Phase Impaired Oral - Pudding Teaspoon -- Oral - Pudding Cup -- Oral - Honey Teaspoon -- Oral - Honey Cup -- Oral - Nectar Teaspoon -- Oral - Nectar Cup -- Oral - Nectar Straw -- Oral - Thin Teaspoon -- Oral - Thin Cup Lingual/palatal residue Oral - Thin Straw Lingual/palatal residue Oral - Puree Lingual/palatal residue Oral - Mech  Soft -- Oral - Regular Lingual/palatal residue Oral - Multi-Consistency -- Oral - Pill Lingual/palatal residue Oral Phase - Comment --  [facility] IP PHARYNGEAL PHASE 10/18/2017 Pharyngeal Phase Impaired Pharyngeal- Pudding Teaspoon -- Pharyngeal -- Pharyngeal- Pudding Cup -- Pharyngeal -- Pharyngeal- Honey Teaspoon -- Pharyngeal -- Pharyngeal- Honey Cup -- Pharyngeal -- Pharyngeal-  Nectar Teaspoon -- Pharyngeal -- Pharyngeal- Nectar Cup -- Pharyngeal -- Pharyngeal- Nectar Straw -- Pharyngeal -- Pharyngeal- Thin Teaspoon -- Pharyngeal -- Pharyngeal- Thin Cup Reduced tongue base retraction;Pharyngeal residue - valleculae Pharyngeal -- Pharyngeal- Thin Straw Pharyngeal residue - valleculae;Reduced tongue base retraction Pharyngeal -- Pharyngeal- Puree Reduced tongue base retraction;Pharyngeal residue - valleculae Pharyngeal -- Pharyngeal- Mechanical Soft -- Pharyngeal -- Pharyngeal- Regular Reduced tongue base retraction;Pharyngeal residue - valleculae Pharyngeal -- Pharyngeal- Multi-consistency -- Pharyngeal -- Pharyngeal- Pill Reduced tongue base retraction;Pharyngeal residue - valleculae Pharyngeal -- Pharyngeal Comment --  [facility] IP CERVICAL ESOPHAGEAL PHASE 11/14/2015 Cervical Esophageal Phase (No Data) Pudding Teaspoon -- Pudding Cup -- Honey Teaspoon -- Honey Cup -- Nectar Teaspoon -- Nectar Cup -- Nectar Straw -- Thin Teaspoon -- Thin Cup -- Thin Straw -- Puree -- Mechanical Soft -- Regular -- Multi-consistency -- Pill -- Cervical Esophageal Comment -- CHL IP GO 11/14/2015 Functional Assessment Tool Used MBS Functional Limitations Swallowing Swallow Current Status (G3151) CI Swallow Goal Status (V6160) CI Swallow Discharge Status (V3710) CI Motor Speech Current Status (G2694) (None) Motor Speech Goal Status (W5462) (None) Motor Speech Goal Status (V0350) (None) Spoken Language Comprehension Current Status (K9381) (None) Spoken Language Comprehension Goal Status (W2993) (None) Spoken Language Comprehension Discharge Status (Z1696) (None) Spoken Language Expression Current Status (V8938) (None) Spoken Language Expression Goal Status (B0175) (None) Spoken Language Expression Discharge Status (Z0258) (None) Attention Current Status (N2778) (None) Attention Goal Status (E4235) (None) Attention Discharge Status (T6144) (None) Memory Current Status (R1540) (None) Memory Goal Status (G8676) (None)  Memory Discharge Status (P9509) (None) Voice Current Status (T2671) (None) Voice Goal Status (I4580) (None) Voice Discharge Status (D9833) (None) Other Speech-Language Pathology Functional Limitation Current Status (A2505) (None) Other Speech-Language Pathology Functional Limitation Goal Status (L9767) (None) Other Speech-Language Pathology Functional Limitation Discharge Status [phone]) (None) Herbie Baltimore, MA CCC-SLP [phone] [place], Katherene Ponto 10/18/2017, 10:34 AM              Dg Hip Unilat  With Pelvis 2-3 Views Right  Result Date: 10/08/2017 CLINICAL DATA:  Right hip pain after fall. EXAM: DG HIP (WITH OR WITHOUT PELVIS) 2-3V RIGHT COMPARISON:  CT abdomen pelvis dated May 02, 2013. FINDINGS: No acute fracture or malalignment. Mild bilateral hip joint space narrowing. The pubic symphysis and sacroiliac joints are intact. Diffuse osteopenia. Multiple surgical clips in the pelvis. Atherosclerotic vascular calcifications. IMPRESSION: No acute osseous abnormality. If occult hip fracture is suspected or if the patient is unable to bear weight, MRI is the preferred modality for further evaluation. Electronically Signed   By: Titus Dubin M.D.   On: 10/08/2017 09:24    (Echo, Carotid, EGD, Colonoscopy, ERCP)    Subjective:   Discharge Exam: Vitals:   10/20/17 0751 10/20/17 0918  BP:  (!) 147/77  Pulse:    Resp:    Temp:    SpO2: 98%    Vitals:   10/20/17 0500 10/20/17 0621 10/20/17 0751 10/20/17 0918  BP:  (!) 126/57  (!) 147/77  Pulse:  81    Resp:  17    Temp:  98.3 F (36.8 C)    TempSrc:  Oral    SpO2:  99% 98%  Weight: 52.3 kg (115 lb 4.8 oz)     Height:        General: Pt is alert, awake, not in acute distress Cardiovascular: RRR, S1/S2 +, no rubs, no gallops Respiratory: CTA bilaterally, no wheezing, no rhonchi Abdominal: Soft, NT, ND, bowel sounds + Extremities: no edema, no cyanosis    The results of significant diagnostics from this hospitalization  (including imaging, microbiology, ancillary and laboratory) are listed below for reference.     Microbiology: No results found for this or any previous visit (from the past 240 hour(s)).   Labs: BNP (last 3 results) No results for input(s): BNP in the last 8760 hours. Basic Metabolic Panel: Recent Labs  Lab 10/15/17 1950 10/16/17 0411 10/17/17 0440 10/18/17 0257 10/19/17 0401 10/20/17 0338  NA  --  137 139 142 139 137  K  --  4.2 3.9 3.9 3.6 4.1  CL  --  109 112* 113* 107 105  CO2  --  19* 18* 19* 22 21*  GLUCOSE  --  99 137* 158* 138* 117*  BUN  --  55* 45* 47* 42* 36*  CREATININE  --  1.32* 1.19* 1.19* 1.03* 1.02*  CALCIUM  --  7.2* 6.8* 6.9* 6.8* 6.6*  MG 1.5*  --  2.0  --   --   --    Liver Function Tests: Recent Labs  Lab 10/15/17 1950  AST 18  ALT 13*  ALKPHOS 39  BILITOT 1.5*  PROT 3.0*  ALBUMIN 1.4*   No results for input(s): LIPASE, AMYLASE in the last 168 hours. No results for input(s): AMMONIA in the last 168 hours. CBC: Recent Labs  Lab 10/15/17 0516 10/16/17 0411 10/17/17 0440 10/18/17 0257 10/20/17 0338  WBC 3.2* 3.3* 2.8* 3.7* 6.1  HGB 8.9* 8.5* 8.5* 8.9* 8.2*  HCT 27.5* 26.7* 27.2* 27.7* 25.2*  MCV 88.4 89.6 90.4 90.2 89.7  PLT 213 157 171 208 189   Cardiac Enzymes: No results for input(s): CKTOTAL, CKMB, CKMBINDEX, TROPONINI in the last 168 hours. BNP: Invalid input(s): POCBNP CBG: Recent Labs  Lab 10/19/17 0745 10/19/17 1133 10/19/17 1732 10/19/17 2245 10/20/17 0759  GLUCAP 126* 168* 131* 147* 114*   D-Dimer No results for input(s): DDIMER in the last 72 hours. Hgb A1c No results for input(s): HGBA1C in the last 72 hours. Lipid Profile No results for input(s): CHOL, HDL, LDLCALC, TRIG, CHOLHDL, LDLDIRECT in the last 72 hours. Thyroid function studies No results for input(s): TSH, T4TOTAL, T3FREE, THYROIDAB in the last 72 hours.  Invalid input(s): FREET3 Anemia work up No results for input(s): VITAMINB12, FOLATE,  FERRITIN, TIBC, IRON, RETICCTPCT in the last 72 hours. Urinalysis    Component Value Date/Time   COLORURINE YELLOW 10/01/2015 [place] 10/01/2015 1155   LABSPEC 1.008 10/01/2015 1155   LABSPEC 1.010 05/07/2014 0835   PHURINE 7.5 10/01/2015 1155   GLUCOSEU NEGATIVE 10/01/2015 1155   GLUCOSEU Negative 05/07/2014 0835   HGBUR NEGATIVE 10/01/2015 1155   HGBUR 3+ 07/22/2011 1338   BILIRUBINUR moderate (A) 05/01/2017 1256   BILIRUBINUR negative 01/25/2017 1039   BILIRUBINUR Negative 05/07/2014 0835   KETONESUR trace (5) (A) 05/01/2017 1256   [place] 10/01/2015 1155   PROTEINUR trace (A) 05/01/2017 1256   PROTEINUR negative 01/25/2017 1039   PROTEINUR NEGATIVE 10/01/2015 1155   UROBILINOGEN negative (A) 05/01/2017 1256   UROBILINOGEN 0.2 05/07/2014 0835   NITRITE Positive (A) 05/01/2017 1256   NITRITE negative 01/25/2017 1039   NITRITE NEGATIVE 10/01/2015 1155  LEUKOCYTESUR Large (3+) (A) 05/01/2017 1256   LEUKOCYTESUR Negative 05/07/2014 0835   Sepsis Labs Invalid input(s): PROCALCITONIN,  WBC,  LACTICIDVEN Microbiology No results found for this or any previous visit (from the past 240 hour(s)).   Time coordinating discharge: Over 30 minutes  SIGNED:   Georgette Shell, MD  [facility] 10/20/2017, 11:46 AM Pager   If 7PM-7AM, please contact night-coverage [email or web address] Password [email or web address]

## 2017-10-21 ENCOUNTER — Inpatient Hospital Stay (HOSPITAL_COMMUNITY): Payer: Medicare HMO

## 2017-10-21 DIAGNOSIS — J9601 Acute respiratory failure with hypoxia: Secondary | ICD-10-CM | POA: Diagnosis not present

## 2017-10-21 DIAGNOSIS — J449 Chronic obstructive pulmonary disease, unspecified: Secondary | ICD-10-CM | POA: Diagnosis present

## 2017-10-21 DIAGNOSIS — E785 Hyperlipidemia, unspecified: Secondary | ICD-10-CM | POA: Diagnosis not present

## 2017-10-21 DIAGNOSIS — I4891 Unspecified atrial fibrillation: Secondary | ICD-10-CM | POA: Diagnosis not present

## 2017-10-21 DIAGNOSIS — I469 Cardiac arrest, cause unspecified: Secondary | ICD-10-CM | POA: Diagnosis not present

## 2017-10-21 DIAGNOSIS — I420 Dilated cardiomyopathy: Secondary | ICD-10-CM | POA: Diagnosis present

## 2017-10-21 DIAGNOSIS — R001 Bradycardia, unspecified: Secondary | ICD-10-CM | POA: Diagnosis not present

## 2017-10-21 DIAGNOSIS — Z9049 Acquired absence of other specified parts of digestive tract: Secondary | ICD-10-CM | POA: Diagnosis not present

## 2017-10-21 DIAGNOSIS — Z853 Personal history of malignant neoplasm of breast: Secondary | ICD-10-CM | POA: Diagnosis not present

## 2017-10-21 DIAGNOSIS — J3801 Paralysis of vocal cords and larynx, unilateral: Secondary | ICD-10-CM | POA: Diagnosis not present

## 2017-10-21 DIAGNOSIS — I972 Postmastectomy lymphedema syndrome: Secondary | ICD-10-CM | POA: Diagnosis not present

## 2017-10-21 DIAGNOSIS — I429 Cardiomyopathy, unspecified: Secondary | ICD-10-CM | POA: Diagnosis present

## 2017-10-21 DIAGNOSIS — I447 Left bundle-branch block, unspecified: Secondary | ICD-10-CM | POA: Diagnosis not present

## 2017-10-21 DIAGNOSIS — M549 Dorsalgia, unspecified: Secondary | ICD-10-CM | POA: Diagnosis not present

## 2017-10-21 DIAGNOSIS — I2510 Atherosclerotic heart disease of native coronary artery without angina pectoris: Secondary | ICD-10-CM | POA: Diagnosis present

## 2017-10-21 DIAGNOSIS — S3210XD Unspecified fracture of sacrum, subsequent encounter for fracture with routine healing: Secondary | ICD-10-CM | POA: Diagnosis not present

## 2017-10-21 DIAGNOSIS — I10 Essential (primary) hypertension: Secondary | ICD-10-CM | POA: Diagnosis not present

## 2017-10-21 DIAGNOSIS — I482 Chronic atrial fibrillation: Secondary | ICD-10-CM | POA: Diagnosis not present

## 2017-10-21 DIAGNOSIS — R2689 Other abnormalities of gait and mobility: Secondary | ICD-10-CM | POA: Diagnosis not present

## 2017-10-21 DIAGNOSIS — Z972 Presence of dental prosthetic device (complete) (partial): Secondary | ICD-10-CM | POA: Diagnosis not present

## 2017-10-21 DIAGNOSIS — E872 Acidosis: Secondary | ICD-10-CM | POA: Diagnosis present

## 2017-10-21 DIAGNOSIS — I890 Lymphedema, not elsewhere classified: Secondary | ICD-10-CM | POA: Diagnosis not present

## 2017-10-21 DIAGNOSIS — I712 Thoracic aortic aneurysm, without rupture: Secondary | ICD-10-CM | POA: Diagnosis not present

## 2017-10-21 DIAGNOSIS — R1312 Dysphagia, oropharyngeal phase: Secondary | ICD-10-CM | POA: Diagnosis not present

## 2017-10-21 DIAGNOSIS — S12100A Unspecified displaced fracture of second cervical vertebra, initial encounter for closed fracture: Secondary | ICD-10-CM | POA: Diagnosis not present

## 2017-10-21 DIAGNOSIS — R0682 Tachypnea, not elsewhere classified: Secondary | ICD-10-CM | POA: Diagnosis not present

## 2017-10-21 DIAGNOSIS — J309 Allergic rhinitis, unspecified: Secondary | ICD-10-CM | POA: Diagnosis present

## 2017-10-21 DIAGNOSIS — C4499 Other specified malignant neoplasm of skin, unspecified: Secondary | ICD-10-CM | POA: Diagnosis not present

## 2017-10-21 DIAGNOSIS — R911 Solitary pulmonary nodule: Secondary | ICD-10-CM | POA: Diagnosis present

## 2017-10-21 DIAGNOSIS — Z973 Presence of spectacles and contact lenses: Secondary | ICD-10-CM | POA: Diagnosis not present

## 2017-10-21 DIAGNOSIS — Z9221 Personal history of antineoplastic chemotherapy: Secondary | ICD-10-CM | POA: Diagnosis not present

## 2017-10-21 DIAGNOSIS — S3210XA Unspecified fracture of sacrum, initial encounter for closed fracture: Secondary | ICD-10-CM | POA: Diagnosis not present

## 2017-10-21 DIAGNOSIS — R0602 Shortness of breath: Secondary | ICD-10-CM | POA: Diagnosis not present

## 2017-10-21 DIAGNOSIS — M810 Age-related osteoporosis without current pathological fracture: Secondary | ICD-10-CM | POA: Diagnosis not present

## 2017-10-21 DIAGNOSIS — E875 Hyperkalemia: Secondary | ICD-10-CM | POA: Diagnosis present

## 2017-10-21 DIAGNOSIS — J9602 Acute respiratory failure with hypercapnia: Secondary | ICD-10-CM | POA: Diagnosis not present

## 2017-10-21 DIAGNOSIS — Z8543 Personal history of malignant neoplasm of ovary: Secondary | ICD-10-CM | POA: Diagnosis not present

## 2017-10-21 DIAGNOSIS — Z923 Personal history of irradiation: Secondary | ICD-10-CM | POA: Diagnosis not present

## 2017-10-21 DIAGNOSIS — I5022 Chronic systolic (congestive) heart failure: Secondary | ICD-10-CM | POA: Diagnosis present

## 2017-10-21 DIAGNOSIS — I110 Hypertensive heart disease with heart failure: Secondary | ICD-10-CM | POA: Diagnosis present

## 2017-10-21 LAB — GLUCOSE, CAPILLARY
GLUCOSE-CAPILLARY: 112 mg/dL — AB (ref 65–99)
GLUCOSE-CAPILLARY: 126 mg/dL — AB (ref 65–99)

## 2017-10-21 NOTE — Progress Notes (Signed)
Insurance authorization has been given and [facility] has been called for pick up for 11 am.

## 2017-10-21 NOTE — Progress Notes (Signed)
Patient will DC to: [facility] Anticipated DC date: 10/21/17 Family notified: Son Transport by: Corey Harold   Per MD patient ready for DC to [facility]. RN, patient, patient's family, and facility notified of DC. Discharge Summary sent to facility. RN given number for report [phone]). DC packet on chart. Ambulance transport requested for patient.   CSW signing off.  Cedric Fishman, [place] Social Worker [phone]

## 2017-10-21 NOTE — Progress Notes (Signed)
Judy Herrera to be D/C'd Rehab per MD order.  Discussed with the patient and all questions fully answered.  VSS, Skin clean, dry and intact without evidence of skin break down, no evidence of skin tears noted. IV catheter discontinued intact. Site without signs and symptoms of complications. Dressing and pressure applied.  An After Visit Summary was printed and given to the patient. Patient received prescription.  D/c education completed with patient/family including follow up instructions, medication list, d/c activities limitations if indicated, with other d/c instructions as indicated by MD - patient able to verbalize understanding, all questions fully answered.   Patient instructed to return to ED, call 911, or call MD for any changes in condition.   Patient escorted via stretcher, and D/Cto [place] via [place].  Holley Raring 10/21/2017 10:20 AM

## 2017-10-21 NOTE — Progress Notes (Signed)
Patient received insurance authoriization. MD cleared patient for discharge.   Judy Herrera LCSWA [phone]

## 2017-10-21 NOTE — Clinical Social Work Placement (Signed)
   CLINICAL SOCIAL WORK PLACEMENT  NOTE  Date:  10/21/2017  Patient Details  Name: Judy Herrera MRN: [id number] Date of Birth: 1931-01-12  Clinical Social Work is seeking post-discharge placement for this patient at the [place] level of care (*CSW will initial, date and re-position this form in  chart as items are completed):  Yes   Patient/family provided with [place] Work Department's list of facilities offering this level of care within the geographic area requested by the patient (or if unable, by the patient's family).  Yes   Patient/family informed of their freedom to choose among providers that offer the needed level of care, that participate in Medicare, Medicaid or managed care program needed by the patient, have an available bed and are willing to accept the patient.  Yes   Patient/family informed of [facility]'s ownership interest in [facility] and [facility], as well as of the fact that they are under no obligation to receive care at these facilities.  PASRR submitted to EDS on 10/14/17     PASRR number received on 10/14/17     Existing PASRR number confirmed on       FL2 transmitted to all facilities in geographic area requested by pt/family on 10/14/17     FL2 transmitted to all facilities within larger geographic area on       Patient informed that his/her managed care company has contracts with or will negotiate with certain facilities, including the following:        Yes   Patient/family informed of bed offers received.  Patient chooses bed at [facility]     Physician recommends and patient chooses bed at      Patient to be transferred to [facility] on 10/21/17.  Patient to be transferred to facility by [facility]     Patient family notified on 10/21/17 of transfer.  Name of family member notified:  Son, [name]     PHYSICIAN       Additional  Comment:    _______________________________________________ Benard Halsted, [place] 10/21/2017, 11:41 AM

## 2017-10-21 NOTE — Progress Notes (Signed)
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 X-rays and overall exam was unremarkable, she was ambulatory and discharged home. However, this morning she woke up unable to get out of bed without assistance. She had, much more severe lower back pain.She denies any numbness or tingling, she denies any constipation or difficulty with urination or incontinence. The patient denies any saddle anesthesia. She denies any rest pain or palpitations. NO changes in meds or herbal productsShe denies any shortness of breath cough. Of note, in route, EMS noted that the patient was very tachycardic, with heart rate between 110-120s. The EMS strips suggested possible atrial fibrillation, but she was essentially asymptomatic. She denies any syncope or presyncope, chest pain or palpitations. Denies shortness of breathThis is new to her, denies any history of atrial fibrillation.  Interim history Patient had respiratory failure with hypoxia and hypercapnia, with age fibrillation and RVR, required intubation.  She was intubated on 10/15/2017 extubated 10/16/2017.  Post extubation patient had stridor which was treated with IV steroids and resolved.     Assessment & Plan:   Principal Problem:   Sacral fracture (HCC) Active Problems:   Hyperlipidemia   Essential hypertension  Osteoporosis   BREAST CANCER, HX OF   Ascending aortic aneurysm (HCC)   Iron deficiency anemia due to chronic blood loss   Iron malabsorption   Lung nodule   Tachycardia   Atrial fibrillation (HCC)   Elevated troponin I measurement   PAT (paroxysmal atrial tachycardia) (HCC)   Pressure injury of skin   CHF (congestive heart failure) (HCC)  Acute respiratory failurewith hypoxia and hypercapnia -In the setting of acute systolic/diastolic heart failure, atrial fibrillation, obstructive lung disease -PCCM called for intubation -Patient successfully extubated -Continue to taper Solu-Medrol, bronchodilators, Mucinex -Attempt to wean nasal cannula if possible  Acute lower back pain with sacral fracture/history of osteoporosis, right L5 vertebral fracture -CT of the lumbar spine showed L5 transverse fracture and sacral insufficiency fracture -Patient received fentanyl, hydrocodone, IV fluids without synechia relief and was admitted -PT, OT consulted recommended SNF -Continue vitamin D and calcium supplementation -Continue pain control with bowel regimen  Atrial fibrillation with RVR/MAT/NSVT/elevated troponin -patient developed AF with respiratory failure -Cardiology consulted and appreciated -required intubation, patient received adenosine, rhythm then showed atrial flutter, and had electrical cardioversion as she remained unstable -patient currently in sinus rhythm  -Cardiology had spoken with the family on 11/30:Discussed long-term management options, possibility of false electrophysiology services and radio frequency ablation. Issue surrounding patient's age and comorbid conditions suggest a more conservative approachisappropriate at this time. -Placed on oral amiodarone, Coreg  Acute onChronic CHF patient on 10 mg of Lasix daily ejection fraction 40%.  Continue the same dose upon discharge.  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 Marin Olp.     DVT prophylaxis: lovenox Code Status: full Family Communicationnone Disposition Plan:dc to snf when bed available Consultants: pccm  Proceduress/p intubation Antimicrobials: none  Subjective:feels okay   Objective:resting in bed in nad Vitals:   10/21/17 0625 10/21/17 0627 10/21/17 0758 10/21/17 0931  BP: 137/67 137/67  (!) 151/75  Pulse: 91 91  97  Resp: 18 18    Temp: 98.5 F (36.9 C) 98.5 F (36.9 C)    TempSrc: Oral Oral    SpO2: 99% 99% 95%   Weight:  53.7 kg (118 lb 6.2 oz)    Height:        Intake/Output Summary (Last 24 hours) at 10/21/2017 0936 Last data filed at 10/20/2017 1101 Gross per 24 hour  Intake 120 ml  Output -  Net 120 ml   Filed Weights   10/20/17 0500 10/21/17 0500 10/21/17 0627  Weight: 52.3 kg (115 lb 4.8 oz) 55.5 kg (122 lb 5.7 oz) 53.7 kg (118 lb 6.2 oz)    Examination:  General exam: Appears calm and comfortable  Respiratory system: Clear to auscultation. Respiratory effort normal. Cardiovascular system: S1 & S2 heard, RRR. No JVD, murmurs, rubs, gallops or clicks. No pedal edema. Gastrointestinal system: Abdomen is nondistended, soft and nontender. No organomegaly or masses felt. Normal bowel sounds heard. Central nervous system: Alert and oriented. No focal neurological deficits. Extremities: Symmetric 5 x 5 power. Skin: No rashes, lesions or ulcers Psychiatry: Judgement and insight appear normal. Mood & affect appropriate.     Data Reviewed: I have personally reviewed following labs and imaging studies  CBC: Recent Labs  Lab 10/15/17 0516 10/16/17 0411 10/17/17 0440 10/18/17 0257 10/20/17 0338  WBC 3.2* 3.3* 2.8* 3.7* 6.1  HGB 8.9* 8.5* 8.5* 8.9* 8.2*  HCT 27.5* 26.7* 27.2* 27.7* 25.2*  MCV 88.4 89.6 90.4 90.2 89.7  PLT 213 157 171 208 644   Basic Metabolic Panel: Recent Labs  Lab 10/15/17 1950 10/16/17 0411 10/17/17 0440 10/18/17 0257 10/19/17 0401 10/20/17 0338  NA  --  137 139 142  139 137  K  --  4.2 3.9 3.9 3.6 4.1  CL  --  109 112* 113* 107 105  CO2  --  19* 18* 19* 22 21*  GLUCOSE  --  99 137* 158* 138* 117*  BUN  --  55* 45* 47* 42* 36*  CREATININE  --  1.32* 1.19* 1.19* 1.03* 1.02*  CALCIUM  --  7.2* 6.8* 6.9* 6.8* 6.6*  MG 1.5*  --  2.0  --   --   --    GFR: Estimated Creatinine Clearance: 34 mL/min (A) (by C-G formula based on SCr of 1.02 mg/dL (H)). Liver Function Tests: Recent Labs  Lab 10/15/17 1950  AST 18  ALT 13*  ALKPHOS 39  BILITOT 1.5*  PROT 3.0*  ALBUMIN 1.4*   No results for input(s): LIPASE, AMYLASE in the last 168 hours. No results for input(s): AMMONIA in the last 168 hours. Coagulation Profile: No results for input(s): INR, PROTIME in the last 168 hours. Cardiac Enzymes: No results for input(s): CKTOTAL, CKMB, CKMBINDEX, TROPONINI in the last 168 hours. BNP (last 3 results) No results for input(s): PROBNP in the last 8760 hours. HbA1C: No results for input(s): HGBA1C in the last 72 hours. CBG: Recent Labs  Lab 10/20/17 0759 10/20/17 1223 10/20/17 1643 10/20/17 2049 10/21/17 0805  GLUCAP 114* 147* 158* 149* 112*   Lipid Profile: No results for input(s): CHOL, HDL, LDLCALC, TRIG, CHOLHDL, LDLDIRECT in the last 72 hours. Thyroid Function Tests: No results for input(s): TSH, T4TOTAL, FREET4, T3FREE, THYROIDAB in the last 72 hours. Anemia Panel: No results for input(s):  VITAMINB12, FOLATE, FERRITIN, TIBC, IRON, RETICCTPCT in the last 72 hours. Sepsis Labs: No results for input(s): PROCALCITON, LATICACIDVEN in the last 168 hours.  No results found for this or any previous visit (from the past 240 hour(s)).       Radiology Studies: Dg Chest Port 1 View  Result Date: 10/21/2017 CLINICAL DATA:  Shortness of Breath EXAM: PORTABLE CHEST 1 VIEW COMPARISON:  10/18/2017 FINDINGS: There is hyperinflation of the lungs compatible with COPD. Cardiomegaly with vascular congestion and bilateral atelectasis or edema. Layering  bilateral effusions noted. Prior left shoulder replacement. IMPRESSION: COPD.  Layering bilateral effusions. Cardiomegaly with vascular congestion. Bibasilar atelectasis or edema. Slight worsening aeration since prior study. Electronically Signed   By: Rolm Baptise M.D.   On: 10/21/2017 08:18        Scheduled Meds: . amiodarone  200 mg Oral BID  . aspirin EC  81 mg Oral QHS  . atorvastatin  20 mg Oral QHS  . carvedilol  6.25 mg Oral BID WC  . chlorhexidine gluconate (MEDLINE KIT)  15 mL Mouth Rinse BID  . dextromethorphan-guaiFENesin  1 tablet Oral BID  . feeding supplement (ENSURE ENLIVE)  237 mL Oral BID BM  . furosemide  10 mg Oral Daily  . heparin injection (subcutaneous)  5,000 Units Subcutaneous Q8H  . insulin aspart  0-5 Units Subcutaneous QHS  . insulin aspart  0-9 Units Subcutaneous TID WC  . ipratropium-albuterol  3 mL Nebulization TID  . lisinopril  2.5 mg Oral Daily  . mouth rinse  15 mL Mouth Rinse BID  . methylPREDNISolone (SOLU-MEDROL) injection  40 mg Intravenous Q12H  . pantoprazole  40 mg Oral Daily  . senna-docusate  1 tablet Oral QHS   Continuous Infusions:   LOS: 10 days       Georgette Shell, MD [facility] Pager [phone]  If 7PM-7AM, please contact night-coverage [email or web address] Password [facility] 10/21/2017, 9:36 AM

## 2017-10-21 NOTE — Progress Notes (Signed)
Pt called complaining of SOB and difficulties breathing.  O2 sat at 97% on 2 L.  Lung sounds diminished at lung bases with wheezing.  RT notified and neb treatment given as well as flutter valve given.  RT  At bedside.  Will continues to monitor.

## 2017-10-22 DIAGNOSIS — R1312 Dysphagia, oropharyngeal phase: Secondary | ICD-10-CM | POA: Diagnosis not present

## 2017-10-22 DIAGNOSIS — R2689 Other abnormalities of gait and mobility: Secondary | ICD-10-CM | POA: Diagnosis not present

## 2017-10-22 DIAGNOSIS — I972 Postmastectomy lymphedema syndrome: Secondary | ICD-10-CM | POA: Diagnosis not present

## 2017-10-26 ENCOUNTER — Ambulatory Visit: Payer: Medicare HMO | Admitting: Physician Assistant

## 2017-10-26 DIAGNOSIS — S3210XD Unspecified fracture of sacrum, subsequent encounter for fracture with routine healing: Secondary | ICD-10-CM | POA: Diagnosis not present

## 2017-10-26 DIAGNOSIS — I890 Lymphedema, not elsewhere classified: Secondary | ICD-10-CM | POA: Diagnosis not present

## 2017-10-26 DIAGNOSIS — E785 Hyperlipidemia, unspecified: Secondary | ICD-10-CM | POA: Diagnosis not present

## 2017-10-26 DIAGNOSIS — I4891 Unspecified atrial fibrillation: Secondary | ICD-10-CM | POA: Diagnosis not present

## 2017-10-28 DIAGNOSIS — I4891 Unspecified atrial fibrillation: Secondary | ICD-10-CM | POA: Diagnosis not present

## 2017-10-28 DIAGNOSIS — I10 Essential (primary) hypertension: Secondary | ICD-10-CM | POA: Diagnosis not present

## 2017-10-28 DIAGNOSIS — S3210XD Unspecified fracture of sacrum, subsequent encounter for fracture with routine healing: Secondary | ICD-10-CM | POA: Diagnosis not present

## 2017-10-28 DIAGNOSIS — J9601 Acute respiratory failure with hypoxia: Secondary | ICD-10-CM | POA: Diagnosis not present

## 2017-11-01 DIAGNOSIS — E785 Hyperlipidemia, unspecified: Secondary | ICD-10-CM | POA: Diagnosis not present

## 2017-11-01 DIAGNOSIS — I4891 Unspecified atrial fibrillation: Secondary | ICD-10-CM | POA: Diagnosis not present

## 2017-11-01 DIAGNOSIS — I890 Lymphedema, not elsewhere classified: Secondary | ICD-10-CM | POA: Diagnosis not present

## 2017-11-01 DIAGNOSIS — S3210XD Unspecified fracture of sacrum, subsequent encounter for fracture with routine healing: Secondary | ICD-10-CM | POA: Diagnosis not present

## 2017-11-02 ENCOUNTER — Other Ambulatory Visit: Payer: Self-pay

## 2017-11-02 ENCOUNTER — Encounter (HOSPITAL_COMMUNITY): Payer: Self-pay | Admitting: Emergency Medicine

## 2017-11-02 ENCOUNTER — Inpatient Hospital Stay (HOSPITAL_COMMUNITY)
Admission: EM | Admit: 2017-11-02 | Discharge: 2017-11-16 | DRG: 208 | Disposition: E | Payer: Medicare HMO | Attending: Internal Medicine | Admitting: Internal Medicine

## 2017-11-02 ENCOUNTER — Emergency Department (HOSPITAL_COMMUNITY): Payer: Medicare HMO

## 2017-11-02 DIAGNOSIS — Z886 Allergy status to analgesic agent status: Secondary | ICD-10-CM

## 2017-11-02 DIAGNOSIS — J9691 Respiratory failure, unspecified with hypoxia: Secondary | ICD-10-CM | POA: Diagnosis present

## 2017-11-02 DIAGNOSIS — I2510 Atherosclerotic heart disease of native coronary artery without angina pectoris: Secondary | ICD-10-CM | POA: Diagnosis present

## 2017-11-02 DIAGNOSIS — J9601 Acute respiratory failure with hypoxia: Secondary | ICD-10-CM | POA: Diagnosis not present

## 2017-11-02 DIAGNOSIS — I482 Chronic atrial fibrillation: Secondary | ICD-10-CM | POA: Diagnosis present

## 2017-11-02 DIAGNOSIS — R2689 Other abnormalities of gait and mobility: Secondary | ICD-10-CM | POA: Diagnosis not present

## 2017-11-02 DIAGNOSIS — Z8249 Family history of ischemic heart disease and other diseases of the circulatory system: Secondary | ICD-10-CM

## 2017-11-02 DIAGNOSIS — J309 Allergic rhinitis, unspecified: Secondary | ICD-10-CM | POA: Diagnosis present

## 2017-11-02 DIAGNOSIS — M810 Age-related osteoporosis without current pathological fracture: Secondary | ICD-10-CM | POA: Diagnosis present

## 2017-11-02 DIAGNOSIS — Z853 Personal history of malignant neoplasm of breast: Secondary | ICD-10-CM

## 2017-11-02 DIAGNOSIS — Z96612 Presence of left artificial shoulder joint: Secondary | ICD-10-CM | POA: Diagnosis present

## 2017-11-02 DIAGNOSIS — I429 Cardiomyopathy, unspecified: Secondary | ICD-10-CM | POA: Diagnosis present

## 2017-11-02 DIAGNOSIS — Z9049 Acquired absence of other specified parts of digestive tract: Secondary | ICD-10-CM

## 2017-11-02 DIAGNOSIS — J449 Chronic obstructive pulmonary disease, unspecified: Secondary | ICD-10-CM | POA: Diagnosis present

## 2017-11-02 DIAGNOSIS — Z79899 Other long term (current) drug therapy: Secondary | ICD-10-CM

## 2017-11-02 DIAGNOSIS — E785 Hyperlipidemia, unspecified: Secondary | ICD-10-CM | POA: Diagnosis present

## 2017-11-02 DIAGNOSIS — I110 Hypertensive heart disease with heart failure: Secondary | ICD-10-CM | POA: Diagnosis present

## 2017-11-02 DIAGNOSIS — I420 Dilated cardiomyopathy: Secondary | ICD-10-CM | POA: Diagnosis present

## 2017-11-02 DIAGNOSIS — Z7982 Long term (current) use of aspirin: Secondary | ICD-10-CM

## 2017-11-02 DIAGNOSIS — Z972 Presence of dental prosthetic device (complete) (partial): Secondary | ICD-10-CM

## 2017-11-02 DIAGNOSIS — Z8543 Personal history of malignant neoplasm of ovary: Secondary | ICD-10-CM

## 2017-11-02 DIAGNOSIS — I972 Postmastectomy lymphedema syndrome: Secondary | ICD-10-CM | POA: Diagnosis not present

## 2017-11-02 DIAGNOSIS — R001 Bradycardia, unspecified: Secondary | ICD-10-CM | POA: Diagnosis not present

## 2017-11-02 DIAGNOSIS — Z9012 Acquired absence of left breast and nipple: Secondary | ICD-10-CM

## 2017-11-02 DIAGNOSIS — Z973 Presence of spectacles and contact lenses: Secondary | ICD-10-CM

## 2017-11-02 DIAGNOSIS — Z885 Allergy status to narcotic agent status: Secondary | ICD-10-CM

## 2017-11-02 DIAGNOSIS — R0602 Shortness of breath: Secondary | ICD-10-CM | POA: Diagnosis not present

## 2017-11-02 DIAGNOSIS — Z90710 Acquired absence of both cervix and uterus: Secondary | ICD-10-CM

## 2017-11-02 DIAGNOSIS — J9602 Acute respiratory failure with hypercapnia: Secondary | ICD-10-CM | POA: Diagnosis not present

## 2017-11-02 DIAGNOSIS — I447 Left bundle-branch block, unspecified: Secondary | ICD-10-CM | POA: Diagnosis not present

## 2017-11-02 DIAGNOSIS — E875 Hyperkalemia: Secondary | ICD-10-CM | POA: Diagnosis present

## 2017-11-02 DIAGNOSIS — J9692 Respiratory failure, unspecified with hypercapnia: Secondary | ICD-10-CM

## 2017-11-02 DIAGNOSIS — E872 Acidosis: Secondary | ICD-10-CM | POA: Diagnosis present

## 2017-11-02 DIAGNOSIS — Z923 Personal history of irradiation: Secondary | ICD-10-CM

## 2017-11-02 DIAGNOSIS — I469 Cardiac arrest, cause unspecified: Secondary | ICD-10-CM

## 2017-11-02 DIAGNOSIS — R911 Solitary pulmonary nodule: Secondary | ICD-10-CM | POA: Diagnosis present

## 2017-11-02 DIAGNOSIS — Z803 Family history of malignant neoplasm of breast: Secondary | ICD-10-CM

## 2017-11-02 DIAGNOSIS — R1312 Dysphagia, oropharyngeal phase: Secondary | ICD-10-CM | POA: Diagnosis not present

## 2017-11-02 DIAGNOSIS — Z9221 Personal history of antineoplastic chemotherapy: Secondary | ICD-10-CM

## 2017-11-02 DIAGNOSIS — I5022 Chronic systolic (congestive) heart failure: Secondary | ICD-10-CM | POA: Diagnosis present

## 2017-11-02 LAB — I-STAT ARTERIAL BLOOD GAS, ED
ACID-BASE DEFICIT: 4 mmol/L — AB (ref 0.0–2.0)
BICARBONATE: 23 mmol/L (ref 20.0–28.0)
O2 SAT: 97 %
PH ART: 7.256 — AB (ref 7.350–7.450)
TCO2: 25 mmol/L (ref 22–32)
pCO2 arterial: 51.8 mmHg — ABNORMAL HIGH (ref 32.0–48.0)
pO2, Arterial: 102 mmHg (ref 83.0–108.0)

## 2017-11-02 LAB — I-STAT TROPONIN, ED: TROPONIN I, POC: 0.04 ng/mL (ref 0.00–0.08)

## 2017-11-02 LAB — CBC WITH DIFFERENTIAL/PLATELET
BASOS PCT: 0 %
Basophils Absolute: 0 10*3/uL (ref 0.0–0.1)
EOS ABS: 0 10*3/uL (ref 0.0–0.7)
EOS PCT: 1 %
HEMATOCRIT: 27.9 % — AB (ref 36.0–46.0)
HEMOGLOBIN: 8.9 g/dL — AB (ref 12.0–15.0)
LYMPHS PCT: 12 %
Lymphs Abs: 0.6 10*3/uL — ABNORMAL LOW (ref 0.7–4.0)
MCH: 29.1 pg (ref 26.0–34.0)
MCHC: 31.9 g/dL (ref 30.0–36.0)
MCV: 91.2 fL (ref 78.0–100.0)
MONOS PCT: 8 %
Monocytes Absolute: 0.4 10*3/uL (ref 0.1–1.0)
NEUTROS PCT: 79 %
Neutro Abs: 3.8 10*3/uL (ref 1.7–7.7)
Platelets: 228 10*3/uL (ref 150–400)
RBC: 3.06 MIL/uL — ABNORMAL LOW (ref 3.87–5.11)
RDW: 19.2 % — ABNORMAL HIGH (ref 11.5–15.5)
WBC: 4.8 10*3/uL (ref 4.0–10.5)

## 2017-11-02 LAB — BASIC METABOLIC PANEL
Anion gap: 7 (ref 5–15)
BUN: 44 mg/dL — ABNORMAL HIGH (ref 6–20)
CALCIUM: 7.1 mg/dL — AB (ref 8.9–10.3)
CHLORIDE: 101 mmol/L (ref 101–111)
CO2: 21 mmol/L — ABNORMAL LOW (ref 22–32)
CREATININE: 1.65 mg/dL — AB (ref 0.44–1.00)
GFR, EST AFRICAN AMERICAN: 32 mL/min — AB (ref >60)
GFR, EST NON AFRICAN AMERICAN: 27 mL/min — AB (ref >60)
Glucose, Bld: 119 mg/dL — ABNORMAL HIGH (ref 65–99)
Potassium: 5.6 mmol/L — ABNORMAL HIGH (ref 3.5–5.1)
SODIUM: 129 mmol/L — AB (ref 135–145)

## 2017-11-02 LAB — BRAIN NATRIURETIC PEPTIDE: B Natriuretic Peptide: 317.6 pg/mL — ABNORMAL HIGH (ref 0.0–100.0)

## 2017-11-02 LAB — I-STAT CG4 LACTIC ACID, ED: Lactic Acid, Venous: 1.05 mmol/L (ref 0.5–1.9)

## 2017-11-02 NOTE — ED Triage Notes (Signed)
Pt to ED via [facility] from [facility].  Staff reported to EMS that pt has had diff. Breathing since last pm.  Pt has been receiving neb treatments every 6 hours since last pm but breathing continues to get worse.  On arrival to ED pt on c-pap and tolerating well.

## 2017-11-02 NOTE — ED Provider Notes (Signed)
[facility] EMERGENCY DEPARTMENT Provider Note   CSN: [id number] Arrival date & time: 10/20/2017  2231     History   Chief Complaint Chief Complaint  Patient presents with  . Respiratory Distress    HPI Judy Herrera is a 83 y.o. female.  The history is provided by the patient and medical records.    Level 5 caveat: Respiratory distress 83 year old female with history of allergies, arthritis, breast cancer, coronary artery disease, cardiomyopathy, congestive heart failure, hypertension, hyperlipidemia, presenting to the ED with shortness of breath.  Patient is from [facility] and rehab, staff reports she has had difficulty breathing since last night.  They have been giving her albuterol neb treatments every 6 hours but she is continued to decline.  Patient was admitted at the end of November with new onset A. fib with RVR.  She decompensated while in the hospital requiring intubation.  She was discharged on 10/20/2017 and seemed to be doing better but had not fully returned back to her baseline per son.  She was discharged on 2 L nasal cannula.    Past Medical History:  Diagnosis Date  . Allergy   . Arthritis   . Breast CA ([place])    NO BP OR STICKS IN LEFT ARM  . CAD (coronary artery disease)   . Cancer ([place])   . Cardiomyopathy ([place])    improved  . Cataract   . Diverticulosis   . Eccrine porocarcinoma of skin   . History of chemotherapy   . History of radiation therapy 1994   left chest wall  . Hyperlipidemia   . Hypertension   . Iron malabsorption 09/03/2017  . Osteoporosis   . Ovarian ca ([place])   . Sacral fracture ([place]) 09/2017  . Wears dentures    full bottom-partial top  . Wears glasses     Patient Active Problem List   Diagnosis Date Noted  . CHF (congestive heart failure) ([place])   . Pressure injury of skin 10/16/2017  . Elevated troponin I measurement   . PAT (paroxysmal atrial tachycardia) ([place])   . Lung nodule 10/11/2017  .  Tachycardia 10/11/2017  . Sacral fracture ([place]) 10/11/2017  . Atrial fibrillation ([place]) 10/11/2017  . Iron deficiency anemia due to chronic blood loss 09/03/2017  . Iron malabsorption 09/03/2017  . Breast cancer screening, high risk patient 04/21/2016  . Ascending aortic aneurysm ([place]) 10/04/2015  . Lymphedema of upper extremity following lymphadenectomy 04/19/2015  . Thrombocytopenia ([place]) 04/19/2015  . Absolute anemia 04/19/2015  . Ovarian cancer ([place]) 04/19/2015  . Unilateral vocal cord paralysis 04/19/2015  . Port-A-Cath in place 04/19/2015  . 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 LBBB (left bundle branch block) 01/12/2014  . 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 TENDINITIS, RIGHT WRIST 11/12/2010  . SYNCOPE 09/23/2010  . BENIGN NEOPLASM SKIN OTHER&UNSPEC PARTS FACE 08/06/2010  . BACK PAIN 07/03/2010  . Hyperlipidemia 01/15/2010  . Congestive dilated cardiomyopathy ([place]) 01/15/2010  . Allergic rhinitis 08/22/2009  . Osteoporosis 02/16/2008  . COPD (chronic obstructive pulmonary disease) ([place]) 02/04/2008  . Essential hypertension 07/13/2007  . Coronary atherosclerosis 07/13/2007  . BREAST CANCER, HX OF 07/13/2007  . COLONIC POLYPS, HX OF 07/13/2007    Past Surgical History:  Procedure Laterality Date  . ABDOMINAL HYSTERECTOMY  2004  . BREAST SURGERY    . CHOLECYSTECTOMY  1993  . EYE SURGERY     CATARACTS  . left breast mastectomy  1994   T3 N1 Mx  .  MASS EXCISION Left 05/09/2014   Procedure: EXCISION OF NODULE LEFT CHEST ;  Surgeon: Pedro Earls, MD;  Location: [facility];  Service: General;  Laterality: Left;  Marland Kitchen MODIFIED RADICAL MASTECTOMY W/ AXILLARY LYMPH NODE DISSECTION W/ PECTORALIS MINOR EXCISION  1994   Left  . right leg  1990   fx  . SHOULDER ARTHROSCOPY    . [place]   replacement-lt  . SKIN BIOPSY  04/27/2011   left hand, forearm and posterior arm    OB History    No data available       Home  Medications    Prior to Admission medications   Medication Sig Start Date End Date Taking? Authorizing Provider  acetaminophen (TYLENOL) 325 MG tablet Take 2 tablets (650 mg total) by mouth every 6 (six) hours as needed for mild pain (or Fever >/= 101). 10/15/17   Mikhail, Velta Addison, DO  albuterol (PROVENTIL) (2.5 MG/3ML) 0.083% nebulizer solution Take 3 mLs (2.5 mg total) by nebulization every 4 (four) hours as needed for wheezing or shortness of breath. 10/20/17   Georgette Shell, MD  amiodarone (PACERONE) 200 MG tablet Take 1 tablet (200 mg total) by mouth 2 (two) times Herrera. 10/20/17   Georgette Shell, MD  aspirin 81 MG EC tablet Take 1 tablet (81 mg total) by mouth at bedtime. 10/15/17   Mikhail, Velta Addison, DO  atorvastatin (LIPITOR) 20 MG tablet Take 1 tablet (20 mg total) by mouth at bedtime. 10/20/17   Georgette Shell, MD  bisacodyl (DULCOLAX) 10 MG suppository Place 1 suppository (10 mg total) rectally Herrera as needed for moderate constipation. 10/15/17   Mikhail, Velta Addison, DO  carvedilol (COREG) 6.25 MG tablet Take 1 tablet (6.25 mg total) by mouth 2 (two) times Herrera with a meal. 10/15/17   Mikhail, Velta Addison, DO  denosumab (PROLIA) 60 MG/ML SOLN injection Inject 60 mg into the skin every 6 (six) months. Administer in upper arm, thigh, or abdomen    [provider]  dextromethorphan-guaiFENesin (MUCINEX DM) 30-600 MG 12hr tablet Take 1 tablet by mouth 2 (two) times Herrera. 10/20/17   Georgette Shell, MD  feeding supplement, ENSURE ENLIVE, (ENSURE ENLIVE) LIQD Take 237 mLs by mouth 2 (two) times Herrera between meals. 10/15/17   Mikhail, Velta Addison, DO  furosemide (LASIX) 20 MG tablet Take 0.5 tablets (10 mg total) by mouth Herrera. 10/21/17   Georgette Shell, MD  HYDROcodone-acetaminophen (NORCO/VICODIN) 5-325 MG tablet Take 1-2 tablets by mouth every 4 (four) hours as needed for moderate pain. 10/15/17   Mikhail, Velta Addison, DO  lisinopril (PRINIVIL,ZESTRIL) 2.5 MG tablet Take  1 tablet (2.5 mg total) by mouth Herrera. 10/21/17   Georgette Shell, MD  loperamide (IMODIUM) 2 MG capsule Take 1 capsule (2 mg total) by mouth as needed for diarrhea or loose stools. 01/24/13   Gordy Levan, MD  megestrol (MEGACE ES) 625 MG/5ML suspension Take 5 mLs (625 mg total) by mouth Herrera. 09/02/17   Volanda Napoleon, MD  ondansetron (ZOFRAN) 4 MG tablet Take 1 tablet (4 mg total) by mouth every 6 (six) hours. Patient not taking: Reported on 08/20/2017 11/28/16   Noe Gens, PA-C  senna-docusate (SENOKOT-S) 8.6-50 MG tablet Take 1 tablet by mouth at bedtime. 10/15/17   Mikhail, Velta Addison, DO  simvastatin (ZOCOR) 40 MG tablet TAKE 1 TABLET AT BEDTIME Patient taking differently: TAKE 1 TABLET (50m)AT BEDTIME 09/28/17   KMarletta Lor MD    Family History Family History  Problem Relation Age of Onset  . Breast cancer Sister        dx in her 57s  . Heart disease Sister   . Heart disease Father   . Heart disease Mother   . Stroke Mother   . Prostate cancer Maternal Uncle   . Stroke Maternal Grandmother   . Testicular cancer Maternal Grandfather   . Stroke Maternal Grandfather   . Stroke Paternal Grandmother   . Heart disease Paternal Grandfather   . Breast cancer Sister 4       BRCA2+  . Breast cancer Sister 61  . Prostate cancer Maternal Uncle   . Breast cancer Other        maternal great aunt dx later in life    Social History Social History   Tobacco Use  . Smoking status: Never Smoker  . Smokeless tobacco: Never Used  Substance Use Topics  . Alcohol use: No  . Drug use: No     Allergies   Ibuprofen and Morphine   Review of Systems Review of Systems  Unable to perform ROS: Acuity of condition     Physical Exam Updated Vital Signs BP (!) 107/49   Pulse 82   Temp (!) 96.8 F (36 C) (Temporal) Comment: Simultaneous filing. User may not have seen previous data. Comment (Src): Simultaneous filing. User may not have seen previous data.   Resp (!) 31   Ht 5' 3"  (1.6 m)   Wt 43.1 kg (95 lb)   SpO2 98%   BMI 16.83 kg/m   Physical Exam  Constitutional: She is oriented to person, place, and time. She appears well-developed and well-nourished.  Elderly, somewhat pale  HENT:  Head: Normocephalic and atraumatic.  Mouth/Throat: Oropharynx is clear and moist.  Eyes: Conjunctivae and EOM are normal. Pupils are equal, round, and reactive to light.  Neck: Normal range of motion.  Cardiovascular: Normal rate, regular rhythm and normal heart sounds.  Pulmonary/Chest: Accessory muscle usage present. No stridor. Tachypnea noted. She is in respiratory distress. She has rhonchi.  Abdominal: Soft. Bowel sounds are normal. There is no tenderness. There is no rebound.  Musculoskeletal: Normal range of motion.  Lymphedema of left arm 2+ pitting edema of BLE  Neurological: She is alert and oriented to person, place, and time.  Skin: Skin is warm and dry.  Psychiatric: She has a normal mood and affect.  Nursing note and vitals reviewed.    ED Treatments / Results  Labs (all labs ordered are listed, but only abnormal results are displayed) Labs Reviewed  CBC WITH DIFFERENTIAL/PLATELET - Abnormal; Notable for the following components:      Result Value   RBC 3.06 (*)    Hemoglobin 8.9 (*)    HCT 27.9 (*)    RDW 19.2 (*)    Lymphs Abs 0.6 (*)    All other components within normal limits  BASIC METABOLIC PANEL - Abnormal; Notable for the following components:   Sodium 129 (*)    Potassium 5.6 (*)    CO2 21 (*)    Glucose, Bld 119 (*)    BUN 44 (*)    Creatinine, Ser 1.65 (*)    Calcium 7.1 (*)    GFR calc non Af Amer 27 (*)    GFR calc Af Amer 32 (*)    All other components within normal limits  BRAIN NATRIURETIC PEPTIDE - Abnormal; Notable for the following components:   B Natriuretic Peptide 317.6 (*)    All other  components within normal limits  I-STAT ARTERIAL BLOOD GAS, ED - Abnormal; Notable for the following  components:   pH, Arterial 7.256 (*)    pCO2 arterial 51.8 (*)    Acid-base deficit 4.0 (*)    All other components within normal limits  I-STAT CHEM 8, ED - Abnormal; Notable for the following components:   Sodium 129 (*)    Potassium 6.3 (*)    BUN 48 (*)    Creatinine, Ser 1.70 (*)    Glucose, Bld 103 (*)    Calcium, Ion 0.90 (*)    TCO2 19 (*)    Hemoglobin 7.1 (*)    HCT 21.0 (*)    All other components within normal limits  TROPONIN I  TROPONIN I  TROPONIN I  CBC WITH DIFFERENTIAL/PLATELET  COMPREHENSIVE METABOLIC PANEL  CBC WITH DIFFERENTIAL/PLATELET  BASIC METABOLIC PANEL  I-STAT TROPONIN, ED  I-STAT CG4 LACTIC ACID, ED  I-STAT CG4 LACTIC ACID, ED  I-STAT ARTERIAL BLOOD GAS, ED    EKG  EKG Interpretation  Date/Time:  Tuesday November 02 2017 22:39:50 EST Ventricular Rate:  82 PR Interval:    QRS Duration: 195 QT Interval:  497 QTC Calculation: 581 R Axis:   2 Text Interpretation:  Sinus rhythm Left bundle branch block Similar to prior Confirmed by Thayer Jew ([id number]) on 2017-11-12 4:22:03 AM       Radiology Dg Chest Port 1 View  Result Date: 11/15/2017 CLINICAL DATA:  Shortness of breath since last night. Recent hospital admission. EXAM: PORTABLE CHEST 1 VIEW COMPARISON:  Most recent radiograph 10/21/2017 FINDINGS: Unchanged cardiomegaly and mediastinal contours with aortic atherosclerosis. Calcified left hilar nodes or nodules are unchanged. Hazy opacity at both lung bases consistent with pleural effusions, right greater than left, slight improvement on the left from prior exam. Slight decrease in vascular congestion. No pneumothorax. Underlying chronic hyperinflation. IMPRESSION: 1. Bilateral pleural effusions, right greater than left. Slight decreased pleural fluid on the left from prior exam. Improving vascular congestion. 2. Unchanged cardiomegaly and thoracic aortic atherosclerosis. Electronically Signed   By: Jeb Levering M.D.   On: 10/23/2017  23:06    Procedures Procedures (including critical care time)   CRITICAL CARE Performed by: Larene Pickett   Total critical care time: 75 minutes  Critical care time was exclusive of separately billable procedures and treating other patients.  Critical care was necessary to treat or prevent imminent or life-threatening deterioration.  Critical care was time spent personally by me on the following activities: development of treatment plan with patient and/or surrogate as well as nursing, discussions with consultants, evaluation of patient's response to treatment, examination of patient, obtaining history from patient or surrogate, ordering and performing treatments and interventions, ordering and review of laboratory studies, ordering and review of radiographic studies, pulse oximetry and re-evaluation of patient's condition.   Medications Ordered in ED Medications  sodium chloride flush (NS) 0.9 % injection 3 mL (not administered)  sodium chloride flush (NS) 0.9 % injection 3 mL (not administered)  0.9 %  sodium chloride infusion (not administered)  acetaminophen (TYLENOL) tablet 650 mg (not administered)  ondansetron (ZOFRAN) injection 4 mg (not administered)  furosemide (LASIX) injection 20 mg (not administered)  enoxaparin (LOVENOX) injection 20 mg (not administered)  calcium gluconate 1 g in sodium chloride 0.9 % 100 mL IVPB (not administered)  phenylephrine 0.4-0.9 MG/10ML-% injection (not administered)  Ketamine HCl-Sodium Chloride 100-0.9 MG/10ML-% SOSY (not administered)  calcium chloride 10 % injection (not administered)  furosemide (LASIX) injection  20 mg (20 mg Intravenous Given 2017-11-24 0219)  EPINEPHrine (ADRENALIN) 1 MG/10ML injection (1 mg Intravenous Given 11-24-2017 0303)  sodium bicarbonate injection (50 mEq Intravenous Given November 24, 2017 0303)  ketamine (KETALAR) injection (100 mg Intravenous Given 24-Nov-2017 0304)  rocuronium (ZEMURON) injection (100 mg Intravenous  Given 11/24/2017 0305)  atropine injection (1 mg Intravenous Given Nov 24, 2017 0335)  calcium chloride injection (1 g Intravenous Given 11/24/2017 0333)  EPINEPHrine (ADRENALIN) 1 MG/10ML injection (1 Syringe Intravenous Given 11-24-17 0406)  sodium bicarbonate injection (50 mEq Intravenous Given 2017/11/24 0333)  0.9 %  sodium chloride infusion ( Intravenous Stopped 11-24-17 0408)  dextrose 50 % solution (25 g Intravenous Given 11-24-2017 0336)  insulin aspart (novoLOG) injection 10 Units (10 Units Intravenous Given 11-24-2017 0337)  phenylephrine (NEO-SYNEPHRINE) injection (160 mcg  Given 11-24-2017 0358)  norepinephrine (LEVOPHED) 4 mg in dextrose 5 % 250 mL (0.016 mg/mL) infusion (0 mcg/min Intravenous Stopped 2017/11/24 0408)     Initial Impression / Assessment and Plan / ED Course  I have reviewed the triage vital signs and the nursing notes.  Pertinent labs & imaging results that were available during my care of the patient were reviewed by me and considered in my medical decision making (see chart for details).  83 year old female presenting to the ED with shortness of breath.  Has been receiving albuterol treatments at her rehab facility every 6 hours without much improvement.  She was hypoxic on EMS arrival and started on CPAP.  Transition to BiPAP immediately on arrival with some improvement.  Recent admission for similar, discharged home on 2 L oxygen which was new.  Patient is awake, alert, protecting her airway.  She is tolerating BiPAP well at this time.  Her breath sounds are junky bilaterally.  Will obtain portable CXR, labs, abg.  Patient was medically screened by Dr. Sherry Ruffing upon arrival to ED just prior to shift change.  Agrees with above work-up.  12:13 AM  Patient continues tolerating bipap well.  She looks improved, talking with family through bipap at this time.  Work-up with hypercapnia picture and likely some component of CHF as she does have peripheral edema. Trop negative. K+ mildly  elevated at 5.6. Family updated with results/plan for admission.  Discussed with Dr. Tamala Julian of hospitalist service-- he will admit for ongoing care.  He did recommend small dose of 45m IV lasix which was given.  0245-- While patient was being evaluated by hospitalist, she was noted to become bradycardic with soft blood pressures into the 609Xsystolic.  Prior to this BP's were stable in the 1833'Asystolic which is around her baseline. She seems to be more somnolent and lethargic.  Her oxygen saturation was still normal, however at this point it was determined that she required intubation for airway protection.  Discussed with her son at the bedside and he elected to proceed.  While preparing to intubate patient she became increasingly more bradycardic down into the 30s and had a witnessed PEA arrest at 0259.  She was not hypoxic at this time.  CPR was immediately initiated, zoll pads placed.  2 doses of epinephrine given as well as amp of bicarb were administered.  ROSC was obtained at 3:04 AM.  Patient still with soft blood pressures but improving with fluids.  Patient remains somewhat bradycardic into the 30s.  She was mildly hyperkalemic here initially at 5.6, given IV calcium chloride with some improvement into the 60s.  Critical care was consulted, will come down for evaluation.  Patient with second  PEA arrest.  ETT tube with balloon leak.  Tube exchanged without profound hypoxia. ROSC obtained after about 3 minutes of high quality CPR and additional epinephrine.  Repeat chemistry panel with increasing potassium, now at 6.3.  Patient remains bradycardic into the 30's-- given additional atropine, bicarb and IV calcium chloride without much change.  On repeat rhythm check, patient with heart rate into the 20s with a rhythm of complete heart block.   Transcutaneous pacing started, HR stable in the 70's but not capturing.  Cardiology was consulted.  Family was updated.  Will continue as full code at this  time.  4:10 AM Patient arrested again while PCCM attempting to insert central line.  I have discussed with patient's son-- he voices that she would not want to be kept alive this way.  He requested that we withdraw care.  TOD pronounced at 0408.  Medical examiner consulted.  Will also contact PCP to sign death certificate.  Have spoken with Liliane Channel, [place]-- patient will not be an ME case.  I have gotten in touch with patient's PCP office and spoken with on call physician, Dr. Ronnald Ramp-- Dr. Burnice Logan will sign death certificate.  Final Clinical Impressions(s) / ED Diagnoses   Final diagnoses:  Acute respiratory failure with hypoxia and hypercapnia ([place])  Hyperkalemia  Cardiac arrest [facility]    ED Discharge Orders    None       Larene Pickett, PA-C 11-12-2017 0601    Larene Pickett, PA-C November 12, 2017 6580    Merryl Hacker, MD 11-12-17 [phone]

## 2017-11-02 NOTE — ED Notes (Signed)
Family at bedside.  Pt remains on Bi-Pap and tolerating well.

## 2017-11-03 ENCOUNTER — Inpatient Hospital Stay (HOSPITAL_COMMUNITY): Payer: Medicare HMO

## 2017-11-03 DIAGNOSIS — I5022 Chronic systolic (congestive) heart failure: Secondary | ICD-10-CM | POA: Diagnosis not present

## 2017-11-03 DIAGNOSIS — Z853 Personal history of malignant neoplasm of breast: Secondary | ICD-10-CM | POA: Diagnosis not present

## 2017-11-03 DIAGNOSIS — Z923 Personal history of irradiation: Secondary | ICD-10-CM | POA: Diagnosis not present

## 2017-11-03 DIAGNOSIS — Z8543 Personal history of malignant neoplasm of ovary: Secondary | ICD-10-CM | POA: Diagnosis not present

## 2017-11-03 DIAGNOSIS — Z973 Presence of spectacles and contact lenses: Secondary | ICD-10-CM | POA: Diagnosis not present

## 2017-11-03 DIAGNOSIS — I482 Chronic atrial fibrillation: Secondary | ICD-10-CM | POA: Diagnosis present

## 2017-11-03 DIAGNOSIS — E785 Hyperlipidemia, unspecified: Secondary | ICD-10-CM | POA: Diagnosis present

## 2017-11-03 DIAGNOSIS — I110 Hypertensive heart disease with heart failure: Secondary | ICD-10-CM | POA: Diagnosis not present

## 2017-11-03 DIAGNOSIS — I469 Cardiac arrest, cause unspecified: Secondary | ICD-10-CM

## 2017-11-03 DIAGNOSIS — Z4682 Encounter for fitting and adjustment of non-vascular catheter: Secondary | ICD-10-CM | POA: Diagnosis not present

## 2017-11-03 DIAGNOSIS — Z9049 Acquired absence of other specified parts of digestive tract: Secondary | ICD-10-CM | POA: Diagnosis not present

## 2017-11-03 DIAGNOSIS — J9692 Respiratory failure, unspecified with hypercapnia: Secondary | ICD-10-CM

## 2017-11-03 DIAGNOSIS — J9691 Respiratory failure, unspecified with hypoxia: Secondary | ICD-10-CM | POA: Diagnosis present

## 2017-11-03 DIAGNOSIS — J9601 Acute respiratory failure with hypoxia: Secondary | ICD-10-CM | POA: Diagnosis not present

## 2017-11-03 DIAGNOSIS — E875 Hyperkalemia: Secondary | ICD-10-CM | POA: Diagnosis present

## 2017-11-03 DIAGNOSIS — I447 Left bundle-branch block, unspecified: Secondary | ICD-10-CM | POA: Diagnosis not present

## 2017-11-03 DIAGNOSIS — Z9221 Personal history of antineoplastic chemotherapy: Secondary | ICD-10-CM | POA: Diagnosis not present

## 2017-11-03 DIAGNOSIS — J449 Chronic obstructive pulmonary disease, unspecified: Secondary | ICD-10-CM | POA: Diagnosis not present

## 2017-11-03 DIAGNOSIS — R911 Solitary pulmonary nodule: Secondary | ICD-10-CM | POA: Diagnosis not present

## 2017-11-03 DIAGNOSIS — J9602 Acute respiratory failure with hypercapnia: Secondary | ICD-10-CM | POA: Diagnosis not present

## 2017-11-03 DIAGNOSIS — I429 Cardiomyopathy, unspecified: Secondary | ICD-10-CM | POA: Diagnosis not present

## 2017-11-03 DIAGNOSIS — R001 Bradycardia, unspecified: Secondary | ICD-10-CM | POA: Diagnosis not present

## 2017-11-03 DIAGNOSIS — J309 Allergic rhinitis, unspecified: Secondary | ICD-10-CM | POA: Diagnosis not present

## 2017-11-03 DIAGNOSIS — M810 Age-related osteoporosis without current pathological fracture: Secondary | ICD-10-CM | POA: Diagnosis present

## 2017-11-03 DIAGNOSIS — J90 Pleural effusion, not elsewhere classified: Secondary | ICD-10-CM | POA: Diagnosis not present

## 2017-11-03 DIAGNOSIS — Z972 Presence of dental prosthetic device (complete) (partial): Secondary | ICD-10-CM | POA: Diagnosis not present

## 2017-11-03 DIAGNOSIS — E872 Acidosis: Secondary | ICD-10-CM | POA: Diagnosis not present

## 2017-11-03 DIAGNOSIS — I420 Dilated cardiomyopathy: Secondary | ICD-10-CM | POA: Diagnosis not present

## 2017-11-03 DIAGNOSIS — I2510 Atherosclerotic heart disease of native coronary artery without angina pectoris: Secondary | ICD-10-CM | POA: Diagnosis present

## 2017-11-03 LAB — I-STAT CHEM 8, ED
BUN: 48 mg/dL — ABNORMAL HIGH (ref 6–20)
CALCIUM ION: 0.9 mmol/L — AB (ref 1.15–1.40)
Chloride: 102 mmol/L (ref 101–111)
Creatinine, Ser: 1.7 mg/dL — ABNORMAL HIGH (ref 0.44–1.00)
Glucose, Bld: 103 mg/dL — ABNORMAL HIGH (ref 65–99)
HEMATOCRIT: 21 % — AB (ref 36.0–46.0)
Hemoglobin: 7.1 g/dL — ABNORMAL LOW (ref 12.0–15.0)
Potassium: 6.3 mmol/L (ref 3.5–5.1)
SODIUM: 129 mmol/L — AB (ref 135–145)
TCO2: 19 mmol/L — AB (ref 22–32)

## 2017-11-03 MED ORDER — ATROPINE SULFATE 1 MG/ML IJ SOLN
INTRAMUSCULAR | Status: AC | PRN
  Administered 2017-11-03: 1 mg via INTRAVENOUS
  Administered 2017-11-03: .5 mg via INTRAVENOUS

## 2017-11-03 MED ORDER — ROCURONIUM BROMIDE 50 MG/5ML IV SOLN
INTRAVENOUS | Status: AC | PRN
  Administered 2017-11-03: 100 mg via INTRAVENOUS

## 2017-11-03 MED ORDER — ENOXAPARIN SODIUM 300 MG/3ML IJ SOLN: 20.0000 mg | INTRAMUSCULAR | Status: DC

## 2017-11-03 MED ORDER — SODIUM BICARBONATE 8.4 % IV SOLN
INTRAVENOUS | Status: AC | PRN
  Administered 2017-11-03: 50 meq via INTRAVENOUS

## 2017-11-03 MED ORDER — PHENYLEPHRINE 40 MCG/ML (10ML) SYRINGE FOR IV PUSH (FOR BLOOD PRESSURE SUPPORT)
PREFILLED_SYRINGE | INTRAVENOUS | Status: AC
  Filled 2017-11-03: qty 10

## 2017-11-03 MED ORDER — SODIUM CHLORIDE 0.9 % IV SOLN
1.0000 g | Freq: Once | INTRAVENOUS | Status: DC
  Filled 2017-11-03: qty 10

## 2017-11-03 MED ORDER — EPINEPHRINE PF 1 MG/10ML IJ SOSY
PREFILLED_SYRINGE | INTRAMUSCULAR | Status: AC | PRN
  Administered 2017-11-03 (×2): 1 mg via INTRAVENOUS

## 2017-11-03 MED ORDER — ENOXAPARIN SODIUM 40 MG/0.4ML ~~LOC~~ SOLN: 40.0000 mg | SUBCUTANEOUS | Status: DC

## 2017-11-03 MED ORDER — SODIUM CHLORIDE 0.9 % IV SOLN: 250.0000 mL | INTRAVENOUS | Status: DC | PRN

## 2017-11-03 MED ORDER — PHENYLEPHRINE HCL 10 MG/ML IJ SOLN
INTRAMUSCULAR | Status: AC | PRN
  Administered 2017-11-03: 160 ug

## 2017-11-03 MED ORDER — ONDANSETRON HCL 4 MG/2ML IJ SOLN: 4.0000 mg | Freq: Four times a day (QID) | INTRAMUSCULAR | Status: DC | PRN

## 2017-11-03 MED ORDER — SODIUM CHLORIDE 0.9 % IV SOLN
INTRAVENOUS | Status: AC | PRN
  Administered 2017-11-03: 1000 mL via INTRAVENOUS

## 2017-11-03 MED ORDER — CALCIUM CHLORIDE 10 % IV SOLN
INTRAVENOUS | Status: AC | PRN
  Administered 2017-11-03 (×2): 1 g via INTRAVENOUS

## 2017-11-03 MED ORDER — DEXTROSE 50 % IV SOLN
INTRAVENOUS | Status: AC | PRN
  Administered 2017-11-03: 25 g via INTRAVENOUS

## 2017-11-03 MED ORDER — SODIUM CHLORIDE 0.9% FLUSH: 3.0000 mL | INTRAVENOUS | Status: DC | PRN

## 2017-11-03 MED ORDER — FUROSEMIDE 10 MG/ML IJ SOLN
20.0000 mg | INTRAMUSCULAR | Status: AC
  Administered 2017-11-03: 20 mg via INTRAVENOUS
  Filled 2017-11-03: qty 2

## 2017-11-03 MED ORDER — CALCIUM CHLORIDE 10 % IV SOLN
INTRAVENOUS | Status: AC
  Filled 2017-11-03: qty 10

## 2017-11-03 MED ORDER — KETAMINE HCL-SODIUM CHLORIDE 100-0.9 MG/10ML-% IV SOSY
PREFILLED_SYRINGE | INTRAVENOUS | Status: AC
  Filled 2017-11-03: qty 10

## 2017-11-03 MED ORDER — SODIUM CHLORIDE 0.9% FLUSH: 3.0000 mL | Freq: Two times a day (BID) | INTRAVENOUS | Status: DC

## 2017-11-03 MED ORDER — FUROSEMIDE 10 MG/ML IJ SOLN: 20.0000 mg | Freq: Two times a day (BID) | INTRAMUSCULAR | Status: DC

## 2017-11-03 MED ORDER — EPINEPHRINE PF 1 MG/10ML IJ SOSY
PREFILLED_SYRINGE | INTRAMUSCULAR | Status: AC | PRN
  Administered 2017-11-03: 1 via INTRAVENOUS
  Administered 2017-11-03: 1 mg via INTRAVENOUS

## 2017-11-03 MED ORDER — ACETAMINOPHEN 325 MG PO TABS: 650.0000 mg | ORAL_TABLET | ORAL | Status: DC | PRN

## 2017-11-03 MED ORDER — NOREPINEPHRINE BITARTRATE 1 MG/ML IV SOLN
0.0000 ug/min | Freq: Once | INTRAVENOUS | Status: AC
  Administered 2017-11-03: 20 ug/min via INTRAVENOUS

## 2017-11-03 MED ORDER — KETAMINE HCL 10 MG/ML IJ SOLN
INTRAMUSCULAR | Status: AC | PRN
  Administered 2017-11-03: 100 mg via INTRAVENOUS

## 2017-11-03 MED ORDER — INSULIN ASPART 100 UNIT/ML ~~LOC~~ SOLN
10.0000 [IU] | Freq: Once | SUBCUTANEOUS | Status: AC
  Administered 2017-11-03: 10 [IU] via INTRAVENOUS

## 2017-11-04 MED FILL — Medication: Qty: 1 | Status: AC

## 2017-11-05 ENCOUNTER — Other Ambulatory Visit: Payer: Self-pay | Admitting: Nurse Practitioner

## 2017-11-16 NOTE — ED Notes (Signed)
MD Tamala Julian paged ref: Patient's low blood pressure readings.

## 2017-11-16 NOTE — ED Provider Notes (Signed)
Medical screening examination/treatment/procedure(s) were conducted as a shared visit with non-physician practitioner(s) and myself.  I personally evaluated the patient during the encounter.   EKG Interpretation  Date/Time:  Tuesday November 02 2017 22:39:50 EST Ventricular Rate:  82 PR Interval:    QRS Duration: 195 QT Interval:  497 QTC Calculation: 581 R Axis:   2 Text Interpretation:  Sinus rhythm Left bundle branch block Similar to prior Confirmed by Thayer Jew [phone]) on 11/30/2017 4:22:03 AM       2:45AM  I was asked to assess the patient for intubation.  Patient was being evaluated by the admitting hospitalist.  In brief she presented with acute respiratory distress.  Had been on BiPAP and had actually clinically improved.  However, within the last 15 minutes blood pressures and heart rate have trended downward.  She is minimally responsive.  Family would like her to be intubated.  She had a recent hospitalization requiring intubation and a brief stay in the ICU.  It appears that she clinically has deteriorated since that hospitalization.  Prior to intubation, patient was noted to have a bradycardic arrest.  She was not hypoxic at that time.  She appeared to be in PEA arrest.  Pads were placed and CPR was immediately initiated.  She underwent standard ALS with epinephrine and bicarbonate.  After 4 minutes of CPR, patient had spontaneous return of circulation.  Repeat EKG shows persistent left bundle branch block.  Sinus rhythm at 65.  Tube placement was confirmed.  Chest x-ray was reviewed and showed no evidence of pneumothorax.  Good tube placement.  Patient subsequently had a second bradycardic arrest.  Prior to that arrest she began to bradycardia down but maintained pulses.  She was given atropine.  She was subsequently given calcium chloride.  She received approximately 3 minutes of CPR with return of circulation.  Post CPR, patient now bradycardic.  Additional calcium chloride,  bicarbonate, and atropine was given.  This did not improve her heart rate.  ET tube was rechecked.  There was a cuff leak.  This was changed and tube was confirmed.  Patient was being externally paced.  Both critical care and cardiology were consulted.  Critical care was at the bedside immediately.  Family was updated.  Suspect at least a portion of her bradycardia was related to hyperkalemia.  Repeat potassium is 6.3.  Unfortunately, patient had a third cardiac arrest.  Family was notified and requested discontinuing efforts.  Patient was pronounced dead at 4:08 AM.   Physical Exam  BP (!) 44/11   Pulse 78   Temp (!) 96.8 F (36 C) (Temporal) Comment: Simultaneous filing. User may not have seen previous data. Comment (Src): Simultaneous filing. User may not have seen previous data.  Resp (!) 72   Ht 5' 3"  (1.6 m)   Wt 43.1 kg (95 lb)   SpO2 (!) 78%   BMI 16.83 kg/m   Physical Exam  ED Course/Procedures     Procedure Name: Intubation Date/Time: 11/30/17 5:54 AM Performed by: Merryl Hacker, MD Pre-anesthesia Checklist: Patient identified Oxygen Delivery Method: Ambu bag Preoxygenation: Pre-oxygenation with 100% oxygen Induction Type: Rapid sequence Laryngoscope Size: Mac and 3 Grade View: Grade III Tube size: 7.5 mm Airway Equipment and Method: Stylet Placement Confirmation: ETT inserted through vocal cords under direct vision Secured at: 21 cm Tube secured with: Tape Dental Injury: Bloody posterior oropharynx  Difficulty Due To: Difficulty was anticipated      Cardiopulmonary Resuscitation (CPR) Procedure Note Directed/Performed by:  HORTON, COURTNEY F I personally directed ancillary staff and/or performed CPR in an effort to regain return of spontaneous circulation and to maintain cardiac, neuro and systemic perfusion.   Marland Kitchen CRITICAL CARE Performed by: Merryl Hacker   Total critical care time: 65 minutes  Critical care time was exclusive of separately  billable procedures and treating other patients.  Critical care was necessary to treat or prevent imminent or life-threatening deterioration.  Critical care was time spent personally by me on the following activities: development of treatment plan with patient and/or surrogate as well as nursing, discussions with consultants, evaluation of patient's response to treatment, examination of patient, obtaining history from patient or surrogate, ordering and performing treatments and interventions, ordering and review of laboratory studies, ordering and review of radiographic studies, pulse oximetry and re-evaluation of patient's condition.  MDM   Patient initially presented with respiratory distress.  Subsequently had cardiac arrest.  Exact etiology unknown but ultimately had refractory bradycardia requiring pacing.  Efforts were ceased at the family's request.       Merryl Hacker, MD 11/11/17 [phone]

## 2017-11-16 NOTE — Code Documentation (Signed)
Pt lost pulses again, CPR initiated

## 2017-11-16 NOTE — Progress Notes (Signed)
Called to [facility] for death. Had prayer with son and his girlfriend and deceased. Nurse had already been given the funeral home information. Conard Novak, Chaplain   11-07-2017 0500  Clinical Encounter Type  Visited With Patient and family together  Visit Type Death  Referral From Nurse  Spiritual Encounters  Spiritual Needs Prayer;Emotional;Grief support  Stress Factors  Family Stress Factors Loss

## 2017-11-16 NOTE — Code Documentation (Signed)
Patient time of death occurred at 16.

## 2017-11-16 NOTE — ED Notes (Signed)
While prepping to intubate patient, this RN noted patient's respiratory effort to slow and ecg heart rate to slow. Checked for a pulse and unable to palpate. CPR started.

## 2017-11-16 NOTE — Consult Note (Signed)
HPI: 84 year old woman past medical history of atrial fibrillation and systolic CHF who was admitted after presenting from the emergency department with respiratory distress for concerns of decompensated CHF.  While still in the emergency department prior to being transferred to the floor patient suffered what is reported as a PEA arrest.  At that time she was resuscitated and was being transcutaneously paced for bradycardia.  She was also treated for hyperkalemia as a possible cause of the cardiac arrest.  Critical care was consulted by Dr. Tamala Julian for management of post cardiac arrest.  While bedside the patient became progressively hypotensive, hypoxic.  Increased FiO2 and vasopressor agents were given without improvement.  Bedside ultrasound was available and while the patient initially had palpable and visual femoral pulses she eventually lost pulses.  ACLS was promptly initiated.  Family was available in the emergency department and discussion with department providers care was withdrawn.  Patient was pronounced.  Problems: Hyperkalemia PEA cardiac arrest Bradycardia Acute respiratory acidosis Non-anion gap metabolic acidosis  Upon my evaluation, this patient had a high probability of imminent or life-threatening deterioration due to cardiac arrest  high complexity decision making to assess, manipulate, and support vital organ system failure including ACLS  I have personally provided 35 minutes of critical care time exclusive of time spent on separately billable procedures and education. Time includes review and summation of previous medical record, laboratory data, radiology results, coordination of care with emergency department physician, emergency department PA, critical care PA, RN, respiratory therapy and providing ACLS care.  Condition: Deceased  Charlesetta Garibaldi, MD  [facility] Pager: [phone]

## 2017-11-16 NOTE — ED Notes (Signed)
MD Tamala Julian to bedside to see patient. Patient not responsive at this time. Respiratory effort remains labored. Decision made to intubate at this time.

## 2017-11-16 DEATH — deceased

## 2017-11-17 ENCOUNTER — Ambulatory Visit: Payer: Medicare HMO | Admitting: Physician Assistant

## 2017-12-02 ENCOUNTER — Ambulatory Visit: Payer: Medicare HMO | Admitting: Hematology & Oncology

## 2017-12-02 ENCOUNTER — Other Ambulatory Visit: Payer: Medicare HMO

## 2017-12-02 ENCOUNTER — Ambulatory Visit: Payer: Medicare HMO

## 2018-01-12 ENCOUNTER — Encounter: Payer: Medicare HMO | Admitting: Internal Medicine

## 2018-07-07 IMAGING — DX DG CHEST 1V PORT
1 series · 1 of 1 positions shown · non-contrast
Comparison: Most recent radiograph 10/21/2017

CLINICAL DATA: Shortness of breath since last night. Recent
hospital admission.

EXAM:
PORTABLE CHEST 1 VIEW

[chest ap]
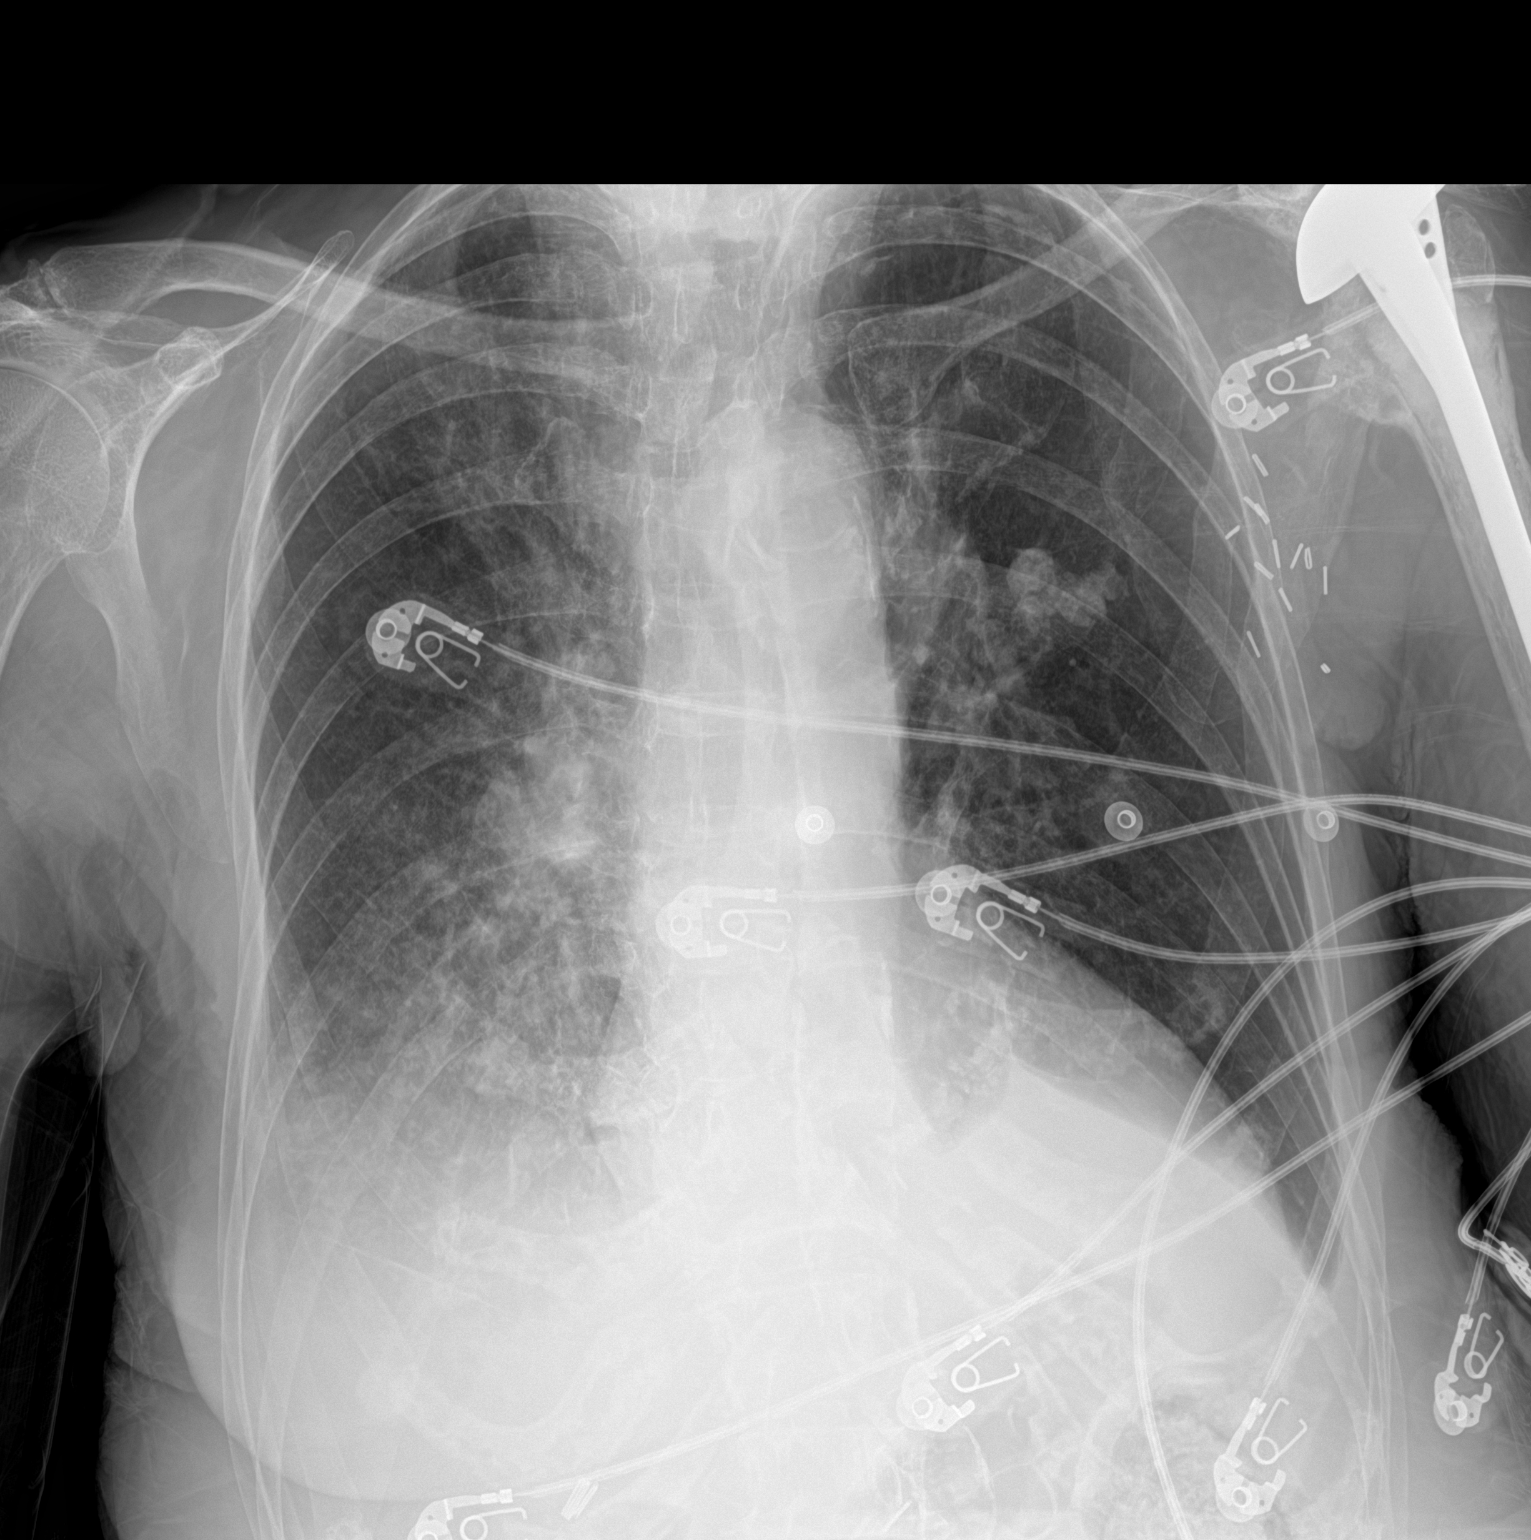

[1 of 1 positions shown; findings below may reference images not displayed]

FINDINGS: Unchanged cardiomegaly and mediastinal contours with aortic
atherosclerosis. Calcified left hilar nodes or nodules are
unchanged. Hazy opacity at both lung bases consistent with pleural
effusions, right greater than left, slight improvement on the left
from prior exam. Slight decrease in vascular congestion. No
pneumothorax. Underlying chronic hyperinflation.
IMPRESSION: 1. Bilateral pleural effusions, right greater than left. Slight
decreased pleural fluid on the left from prior exam. Improving
vascular congestion.
2. Unchanged cardiomegaly and thoracic aortic atherosclerosis.

## 2018-07-08 IMAGING — DX DG CHEST 1V PORT
1 series · 1 of 1 positions shown · non-contrast
Comparison: Yesterday at 8887 hour

CLINICAL DATA: Post intubation.  Post CPR.

EXAM:
PORTABLE CHEST 1 VIEW

[chest]
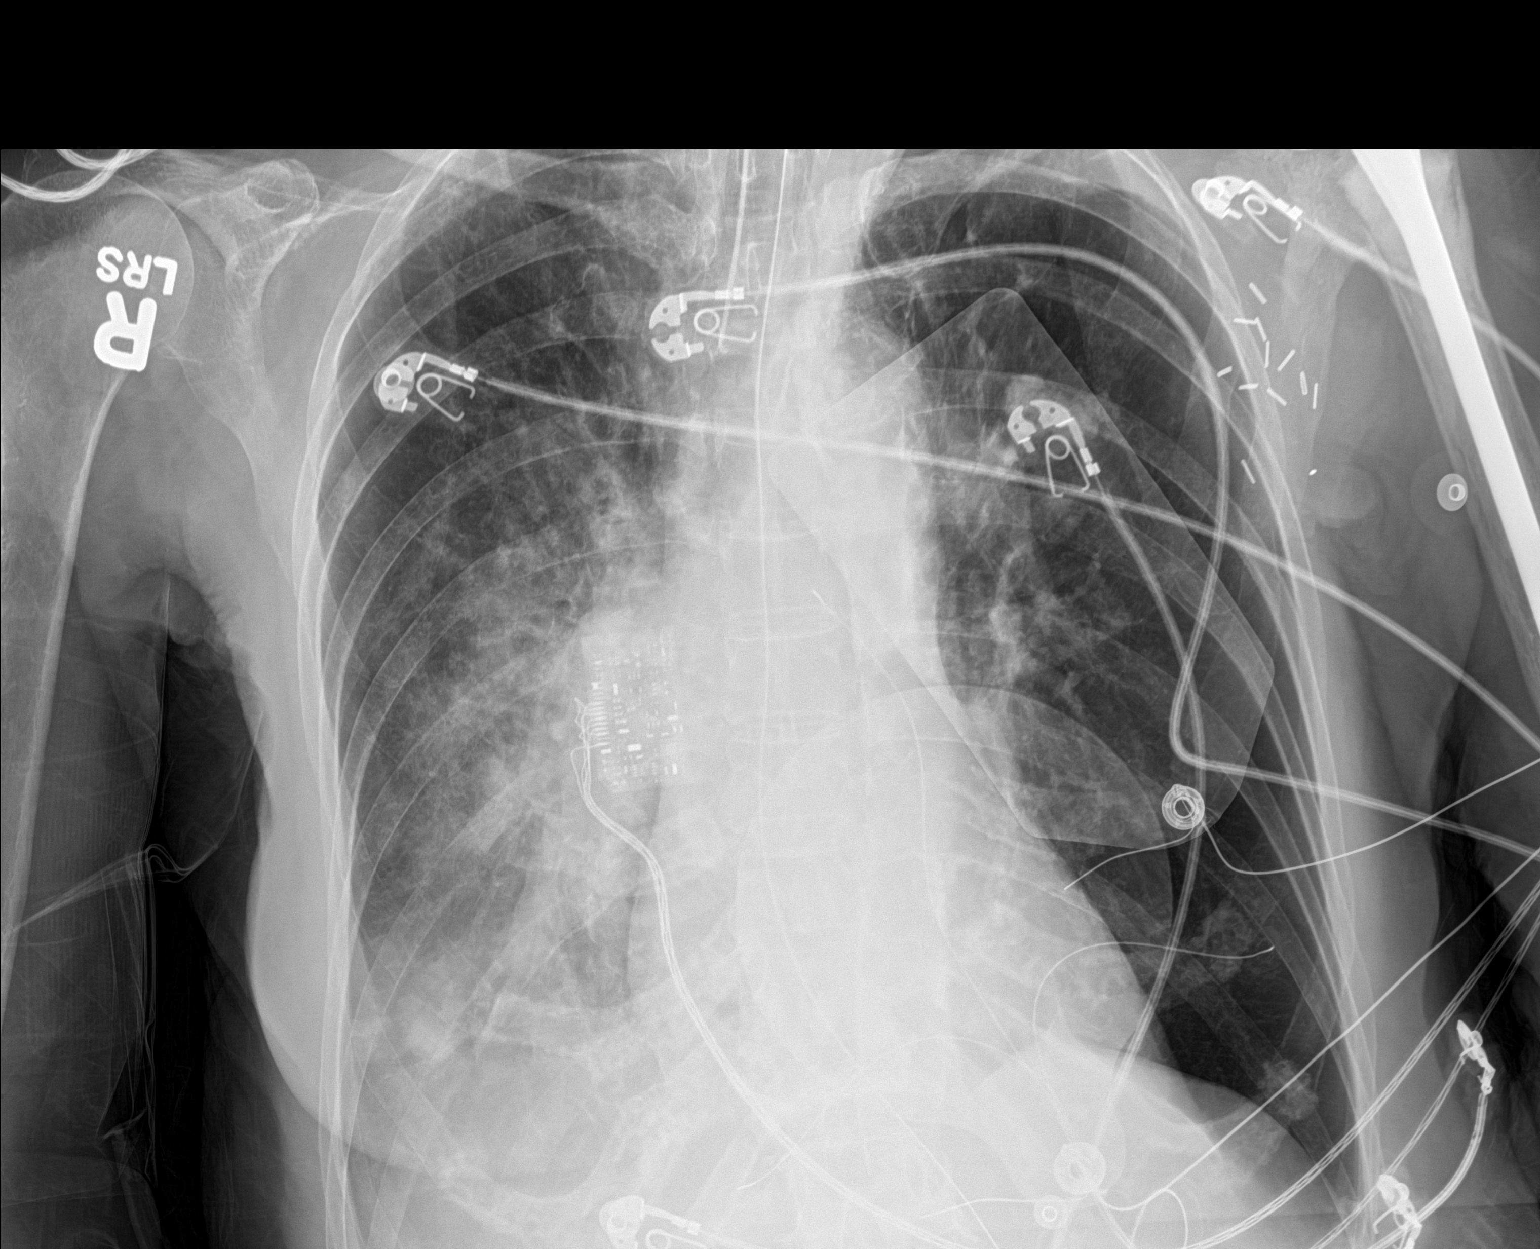

[1 of 1 positions shown; findings below may reference images not displayed]

FINDINGS: Endotracheal tube 3.9 cm from the carina. Enteric tube tip below the
diaphragm, included in the field of view. The side port is not well
visualized. Decreased cardiomegaly from prior exam. Worsening right
perihilar airspace disease, right pleural effusion is similar. No
pneumothorax. Chronic underlying hyperinflation. Overlying
monitoring devices.
IMPRESSION: 1. Endotracheal tube 3.9 cm from the carina. Enteric tube tip below
the diaphragm not included in the field of view.
2. Worsening right perihilar airspace disease, may be pulmonary
edema, atelectasis or aspiration. Right pleural effusion is similar.
